# Patient Record
Sex: Male | Born: 1940 | Race: White | Hispanic: No | Marital: Married | Smoking: Former smoker
Health system: Southern US, Community
[De-identification: ages and names within clinical notes are randomized; demographics above are authoritative.]

## PROBLEM LIST (undated history)

## (undated) DIAGNOSIS — E039 Hypothyroidism, unspecified: Secondary | ICD-10-CM

## (undated) DIAGNOSIS — I091 Rheumatic diseases of endocardium, valve unspecified: Secondary | ICD-10-CM

## (undated) DIAGNOSIS — K219 Gastro-esophageal reflux disease without esophagitis: Secondary | ICD-10-CM

## (undated) DIAGNOSIS — G709 Myoneural disorder, unspecified: Secondary | ICD-10-CM

## (undated) DIAGNOSIS — I059 Rheumatic mitral valve disease, unspecified: Secondary | ICD-10-CM

## (undated) DIAGNOSIS — Z9189 Other specified personal risk factors, not elsewhere classified: Secondary | ICD-10-CM

## (undated) DIAGNOSIS — G894 Chronic pain syndrome: Secondary | ICD-10-CM

## (undated) DIAGNOSIS — R011 Cardiac murmur, unspecified: Secondary | ICD-10-CM

## (undated) DIAGNOSIS — F32A Depression, unspecified: Secondary | ICD-10-CM

## (undated) DIAGNOSIS — K8020 Calculus of gallbladder without cholecystitis without obstruction: Secondary | ICD-10-CM

## (undated) DIAGNOSIS — L409 Psoriasis, unspecified: Secondary | ICD-10-CM

## (undated) DIAGNOSIS — J189 Pneumonia, unspecified organism: Secondary | ICD-10-CM

## (undated) DIAGNOSIS — I2699 Other pulmonary embolism without acute cor pulmonale: Secondary | ICD-10-CM

## (undated) DIAGNOSIS — F329 Major depressive disorder, single episode, unspecified: Secondary | ICD-10-CM

## (undated) DIAGNOSIS — I10 Essential (primary) hypertension: Secondary | ICD-10-CM

## (undated) DIAGNOSIS — I739 Peripheral vascular disease, unspecified: Secondary | ICD-10-CM

## (undated) DIAGNOSIS — D509 Iron deficiency anemia, unspecified: Secondary | ICD-10-CM

## (undated) DIAGNOSIS — IMO0001 Reserved for inherently not codable concepts without codable children: Secondary | ICD-10-CM

## (undated) DIAGNOSIS — M791 Myalgia, unspecified site: Secondary | ICD-10-CM

## (undated) DIAGNOSIS — K2270 Barrett's esophagus without dysplasia: Secondary | ICD-10-CM

## (undated) HISTORY — PX: BACK SURGERY: SHX140

## (undated) HISTORY — DX: Calculus of gallbladder without cholecystitis without obstruction: K80.20

## (undated) HISTORY — DX: Myalgia, unspecified site: M79.10

## (undated) HISTORY — PX: HEMORROIDECTOMY: SUR656

## (undated) HISTORY — DX: Psoriasis, unspecified: L40.9

## (undated) HISTORY — DX: Rheumatic mitral valve disease, unspecified: I05.9

## (undated) HISTORY — DX: Barrett's esophagus without dysplasia: K22.70

## (undated) HISTORY — DX: Rheumatic diseases of endocardium, valve unspecified: I09.1

## (undated) HISTORY — DX: Chronic pain syndrome: G89.4

## (undated) HISTORY — DX: Iron deficiency anemia, unspecified: D50.9

## (undated) HISTORY — DX: Essential (primary) hypertension: I10

## (undated) HISTORY — DX: Cardiac murmur, unspecified: R01.1

---
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
## 2004-03-27 ENCOUNTER — Ambulatory Visit: Payer: Self-pay | Admitting: Rheumatology

## 2004-04-07 ENCOUNTER — Inpatient Hospital Stay: Payer: Self-pay

## 2004-04-07 ENCOUNTER — Other Ambulatory Visit: Payer: Self-pay

## 2004-04-11 ENCOUNTER — Ambulatory Visit: Payer: Self-pay

## 2004-04-14 ENCOUNTER — Other Ambulatory Visit: Payer: Self-pay

## 2004-04-14 ENCOUNTER — Inpatient Hospital Stay: Payer: Self-pay | Admitting: Internal Medicine

## 2004-04-17 ENCOUNTER — Inpatient Hospital Stay: Payer: Self-pay

## 2004-06-27 ENCOUNTER — Ambulatory Visit: Payer: Self-pay | Admitting: Rheumatology

## 2007-02-10 LAB — HM COLONOSCOPY

## 2007-03-01 ENCOUNTER — Ambulatory Visit: Payer: Self-pay | Admitting: Gastroenterology

## 2008-01-18 ENCOUNTER — Encounter: Payer: Self-pay | Admitting: Cardiovascular Disease

## 2008-01-27 ENCOUNTER — Encounter: Payer: Self-pay | Admitting: Cardiovascular Disease

## 2008-02-23 ENCOUNTER — Encounter: Payer: Self-pay | Admitting: Cardiovascular Disease

## 2008-05-15 ENCOUNTER — Ambulatory Visit: Payer: Self-pay | Admitting: Gastroenterology

## 2008-08-21 ENCOUNTER — Encounter: Payer: Self-pay | Admitting: Cardiovascular Disease

## 2008-12-07 ENCOUNTER — Encounter: Payer: Self-pay | Admitting: Cardiovascular Disease

## 2009-05-13 ENCOUNTER — Encounter: Payer: Self-pay | Admitting: Cardiovascular Disease

## 2009-05-20 ENCOUNTER — Ambulatory Visit: Payer: Self-pay | Admitting: Family Medicine

## 2009-07-24 ENCOUNTER — Ambulatory Visit: Payer: Self-pay | Admitting: Cardiovascular Disease

## 2009-07-24 DIAGNOSIS — E060 Acute thyroiditis: Secondary | ICD-10-CM | POA: Insufficient documentation

## 2009-07-24 DIAGNOSIS — R011 Cardiac murmur, unspecified: Secondary | ICD-10-CM

## 2009-07-24 DIAGNOSIS — I10 Essential (primary) hypertension: Secondary | ICD-10-CM | POA: Insufficient documentation

## 2009-12-16 ENCOUNTER — Telehealth: Payer: Self-pay | Admitting: Cardiovascular Disease

## 2010-07-22 NOTE — Progress Notes (Signed)
Summary: Refill Bystolic  Phone Note Refill Request   Refills Requested: Medication #1:  BYSTOLIC 5 MG TABS 1 tab by mouth as needed for palpitations. [facility]     Prescriptions: BYSTOLIC 5 MG TABS (NEBIVOLOL HCL) 1 tab by mouth as needed for palpitations  #30 x 3   Entered by:   Bishop Dublin, CMA   Authorized by:   Dossie Arbour MD   Signed by:   Bishop Dublin, CMA on 12/16/2009   Method used:   Electronically to        [phone][facility] (retail)       [address] [place], [place]  [address]       Ph: [phone] or [phone]       Fax: [phone]   RxID:   [id number]

## 2010-07-22 NOTE — Progress Notes (Signed)
Summary: [facility]  [facility]   Imported By: Harlon Flor 07/15/2009 11:12:30  _____________________________________________________________________  External Attachment:    Type:   Image     Comment:   External Document

## 2010-07-22 NOTE — Progress Notes (Signed)
Summary: [facility]  [facility]   Imported By: Harlon Flor 07/15/2009 11:12:05  _____________________________________________________________________  External Attachment:    Type:   Image     Comment:   External Document

## 2010-07-22 NOTE — Progress Notes (Signed)
Summary: PHI  PHI   Imported By: Harlon Flor 07/25/2009 16:44:14  _____________________________________________________________________  External Attachment:    Type:   Image     Comment:   External Document

## 2010-07-22 NOTE — Assessment & Plan Note (Signed)
Summary: NP6/AMD   Visit Type:  New Patient Primary Provider:  Dr. Marguerite Olea  CC:  no cp and sob with exertion - getting better; no edema...  History of Present Illness: 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  [name] states that he is having a problem with his thyroid and was told he had thyroiditis. He has been having periods of palpitations and tachycardia associated with some shortness of breath, typically with exertion. It has been better recently though he states that it was bad for a while. He has been tolerating his medications well with no significant problems. He tries to exercise when he can though is limited due to his chronic muscle aches. He denies any significant chest pain, lightheadedness, near syncope or syncope.  Preventive Screening-Counseling & Management  Alcohol-Tobacco     Alcohol drinks/day: 0     Smoking Status: quit     Year Quit: 2003     Pack years: 1.5  Caffeine-Diet-Exercise     Caffeine use/day: no     Does Patient Exercise: no   Current Problems (verified): 1)  Hyperlipidemia  (ICD-272.4) 2)  Hypertension, Unspecified  (ICD-401.9) 3)  Murmur  (ICD-785.2)  Current Medications (verified): 1)  Lyrica 100 Mg Caps (Pregabalin) .... 4 Tabs Once Daily 2)  Tramadol Hcl 50 Mg Tabs (Tramadol Hcl) .... 2 Tabs Three Times A Day 3)  Amitriptyline Hcl 50 Mg Tabs (Amitriptyline Hcl) .[name].. 1 Tab Once Daily 4)  Osteo Bi-Flex Regular Strength 250-200 Mg Tabs (Glucosamine-Chondroitin) .[name].. 1 Tab Once Daily 5)  Fish Oil 1000 Mg Caps (Omega-3 Fatty Acids) .... 2 Capsules Once Daily 6)  Niacin Cr 1000 Mg Cr-Tabs (Niacin) .[name].. 1 Tab Once Daily 7)  Nexium 40 Mg Cpdr (Esomeprazole Magnesium) .[name].. 1 Tab Once Daily 8)   Claritin 10 Mg Caps (Loratadine) .... As Needed 9)  Vitamin D-1000 Max St [phone] Mg-Unit Tabs (Calcium-Vitamin D) .[name].. 1 By Mouth Two Times A Day 10)  Micardis Hct 80-25 Mg Tabs (Telmisartan-Hctz) .[name].. 1  By Mouth Once Daily 11)  Welchol 625 Mg Tabs (Colesevelam Hcl) .... 3 Tabs Two Times A Day 12)  Zolpidem Tartrate 10 Mg Tabs (Zolpidem Tartrate) .[name].. 1 By Mouth Once Daily 13)  Centrum Silver  Tabs (Multiple Vitamins-Minerals) .[name].. 1 By Mouth Once Daily 14)  Resveratrol 100 Mg Caps (Resveratrol) .... 2 By Mouth Once Daily 15)  Finasteride 5 Mg Tabs (Finasteride) .[name].. 1 By Mouth Once Daily 16)  Clobetasol Propionate 0.05 % Crea (Clobetasol Propionate) .... As Directed 17)  Lipoic Acid 150 Mg Caps (Alpha-Lipoic Acid) .[name].. 1 By Mouth Two Times A Day 18)  Aspirin 81 Mg Tbec (Aspirin) .... 2  Tablet By Mouth Daily  Allergies (verified): 1)  ! * Statin 2)  ! Crestor  Past History:  Family History: Last updated: 08-08-2009 Father: Deceased. Died at 59 yrs old. Illness: CHF. Mother: Deceased. Died at 46 yrs old. Illness: Heart failure. Siblings:  Brother: Deceased. 56 yrs old. Illness: Brain tumor. Sister: Died at 87. Illness: cancer. Sister: Died at 75. Illness: Aneurysm of the brain.  Social History: Last updated: 08-08-09 Retired disabled from [employer]. Married  2 children Tobacco Use - Former. Quit 3 years ago  Alcohol Use - no Drinks 4-5 cups of coffee a day, and 3-4  cans of soda a day.  Risk Factors: Alcohol Use: 0 (07/24/2009) Caffeine Use: no (07/24/2009) Exercise: no  (07/24/2009)  Risk Factors: Smoking Status: quit (07/24/2009)  Past Medical History: History of Murmur Rheumatic valve Mitral valve disease hypertension hemorrhoids psoriasis severe pain syndrome multiple myalgias  Thyroiditis Prostate ? cancer  Past Surgical History: back surgery hermorrhoid surgery  Social History: Alcohol drinks/day:  0 Pack years:   1.5 Caffeine use/day:  no Does Patient Exercise:  no   Review of Systems       diffuse muscle ache, palpitations, SOB with exertion  Vital Signs:  Patient profile:   70 year old male Height:      72 inches Weight:      173 pounds BMI:     23.55 Pulse rate:   76 / minute Pulse rhythm:   regular BP sitting:   112 / 68  (left arm) Cuff size:   regular  Vitals Entered By: Mercer Pod (July 24, 2009 9:16 AM)  Physical Exam  General:  well-appearing gentleman in no apparent distress. Thin. HEENT exam is benign, oropharynx is clear neck is supple with no JVP or carotid bruits heart sounds are regular with 2/6 systolic ejection murmur at noted in the axilla radiating to the apex lungs are clear to auscultation with no wheezes or rales. Abdominal exam is benign nontender. No significant lower extremity edema, pulses are equal and symmetrical in his upper and lower extremities.   New Orders:     1)  EKG w/ Interpretation (93000)   Problems:  Medical Problems Added: 1)  Dx of Acute Thyroiditis  (ICD-245.0) 2)  Dx of Hyperlipidemia  (ICD-272.4) 3)  Dx of Hypertension, Unspecified  (ICD-401.9) 4)  Dx of Murmur  (ICD-785.2)  Impression & Recommendations:  Problem # 1:  ACUTE THYROIDITIS (ICD-245.0) Mr. counselor has a very low TSH and is likely contributing to his palpitations and tachycardia and shortness of breath with exertion. We have given him some samples of bystolic 5 mg that he can take on an as needed basis. He states that it has improved though he can take the beta blocker on an as-needed basis. If he likes the way he feels on the beta blocker, I have asked him to call us if her a prescription for by systolic we'll change him to an alternate beta blocker might be more affordable.  Problem # 2:  MURMUR (ICD-785.2) [name] has mild to moderate mitral regurgitation and mitral annular calcification. He is relatively asymptomatic. We will not repeat an  echocardiogram at this time but would certainly do so for worsening symptoms of shortness of breath or a change in his clinical presentation. His updated medication list for this problem includes:    Losartan Potassium-hctz 100-25 Mg Tabs (Losartan potassium-hctz) .[name]... 1 tab by mouth daily    Bystolic 5 Mg Tabs (Nebivolol hcl) .[name]... 1 tab by mouth as needed for palpitations  His updated medication list for this problem includes:    Losartan Potassium-hctz 100-25 Mg Tabs (Losartan potassium-hctz) .[name]... 1 tab by mouth daily    Bystolic 5 Mg Tabs (Nebivolol hcl) .[name]... 1 tab by mouth as needed for palpitations  Problem # 3:  HYPERLIPIDEMIA (ICD-272.4) cholesterol is relatively well controlled on WelChol 3 tablets twice a day and Niaspan 1000 mg daily. We will continue him on his current medication regimen. He has been unable to tolerate statins due to chronic muscle ache His updated medication list for this problem includes:  Niacin Cr 1000 Mg Cr-tabs (Niacin) .[name]... 2 tab once daily    Welchol 625 Mg Tabs (Colesevelam hcl) .Marland KitchenMarland KitchenMarland KitchenMarland Kitchen 3 tabs two times a day  Problem # 4:  HYPERTENSION, UNSPECIFIED (ICD-401.9) blood pressure is well controlled. We will change his Micardis HCT to losartan HCT in an effort to save him some money. He states that he Micardis is very expensive for him. The dose of the losartan will be 100/25 mg daily. His updated medication list for this problem includes:    Losartan Potassium-hctz 100-25 Mg Tabs (Losartan potassium-hctz) .[name]... 1 tab by mouth daily    Aspirin 81 Mg Tbec (Aspirin) .[name]... 2  tablet by mouth daily    Bystolic 5 Mg Tabs (Nebivolol hcl) .[name]... 1 tab by mouth as needed for palpitations  Orders: EKG w/ Interpretation (93000)  Patient Instructions: 1)  Your physician recommends that you schedule a follow-up appointment in: 1 year 2)  Your physician has recommended you make the following change in your medication: stop micardis-hctz. start losartan-hctz 100-25 mg  daily, start bystolic 5 mg as needed.   Prescriptions: NIACIN CR 1000 MG CR-TABS (NIACIN) 2 tab once daily  #180 x 4   Entered by:   Charlena Cross, RN, BSN   Authorized by:   Dossie Arbour MD   Signed by:   Charlena Cross, RN, BSN on 07/24/2009   Method used:   [facility]        [facility]       [address]       [place], [place]  [address]       Ph: [phone]       Fax: [phone]   RxID:   [id number] WELCHOL 625 MG TABS (COLESEVELAM HCL) 3 tabs two times a day  #540 x 4   Entered by:   Charlena Cross, RN, BSN   Authorized by:   Dossie Arbour MD   Signed by:   Charlena Cross, RN, BSN on 07/24/2009   Method used:   Electronically to        [facility]       [address]       [place], [place]  [address]       Ph: [phone]       Fax: [phone]   RxID:   [id number] LOSARTAN POTASSIUM-HCTZ 100-25 MG TABS (LOSARTAN POTASSIUM-HCTZ) 1 tab by mouth daily  #90 x 4   Entered by:   Charlena Cross, RN, BSN   Authorized by:   Dossie Arbour MD   Signed by:   Charlena Cross, RN, BSN on 07/24/2009   Method used:   [facility]        [facility]       [address]       [place], [place]  [address]       Ph: [phone]       Fax: [phone]   RxID:   [id number]   Appended Document: NP6/AMD    Clinical Lists Changes  Observations: Added new observation of T4, FREE: 4.30 ng/dL (51/76/1607 37:10) Added new observation of TSH: 0.057 microintl units/mL (05/13/2009 10:49) Added new observation of HGBA1C: 6.1 % (05/13/2009 10:49) Added new observation of TRIGLYCERIDE: 55 mg/dL (62/69/4854 62:70) Added new observation of HDL: 36.9 mg/dL (35/00/9381 82:99) Added new observation of LDL: 90.1 mg/dL (37/16/9678 93:81) Added new observation of ALBUMIN: 3.8 g/dL (01/75/1025 85:27) Added new observation of PROTEIN, TOT: 6.8 g/dL (78/24/2353 61:44) Added  new observation of CHOLESTEROL: 138 mg/dL (08/65/7846 96:29) Added new observation of ALK PHOS: 83 units/L (05/13/2009 10:46) Added new observation of BILI TOTAL: 0.5 mg/dL (52/84/1324 40:10) Added new observation of SGPT (ALT): 12 units/L (05/13/2009 10:46) Added new observation of SGOT (AST): 18 units/L (05/13/2009 10:46) Added new observation of CALCIUM: 9.1 mg/dL (27/25/3664 40:34) Added new observation of CO2 PLSM/SER: 29.1 meq/L (05/13/2009 10:46) Added new observation of CL SERUM: 103 meq/L (05/13/2009 10:46) Added new observation of K SERUM: 3.6 meq/L (05/13/2009 10:46) Added new observation of NA: 140.3 meq/L (05/13/2009 10:46) Added new observation of CREATININE: 1.0 mg/dL (74/25/9563 87:56) Added new observation of BUN: 20 mg/dL (43/32/9518 84:16) Added new observation of BG RANDOM: 134 mg/dL (60/63/0160 10:93) Added new observation of LYMPH %: 28.2 % (05/13/2009 10:45) Added new observation of PMN %: 56.2 % (05/13/2009 10:45) Added new observation of RDW: 14.5 % (05/13/2009 10:45) Added new observation of MCV: 83.6 fL (05/13/2009 10:45) Added new observation of PLATELETK/UL: 253 K/uL (05/13/2009 10:45) Added new observation of RBC M/UL: 4.21 M/uL (05/13/2009 10:45) Added new observation of HCT: 35.2 % (05/13/2009 10:45) Added new observation of HGB: 11.0 g/dL (23/55/7322 02:54) Added new observation of WBC COUNT: 6.7 10*3/microliter (05/13/2009 10:45)       -  Date:  05/13/2009    WBC: 6.7    HGB: 11.0    HCT: 35.2    RBC: 4.21    PLT: 253    MCV: 83.6    RDW: 14.5    Neutrophil: 56.2    Lymphs: 28.2    BG Random: 134    BUN: 20    Creatinine: 1.0    Sodium: 140.3    Potassium: 3.6    Chloride: 103    CO2 Total: 29.1    Calcium: 9.1    SGOT (AST): 18    SGPT (ALT): 12    T. Bilirubin: 0.5    Alk Phos: 83    Cholesterol: 138    Total Protein: 6.8    Albumin: 3.8    LDL: 90.1    HDL: 36.9    Triglycerides: 55    HgbA1c: 6.1    TSH: 0.057     T4-Free: 4.30  Appended Document: NP6/AMD  ROS The patient denies anorexia, fever, weight loss, weight gain, vision loss, decreased hearing, hoarseness, chest pain, syncope, dyspnea on exertion, peripheral edema, prolonged cough, headaches, hemoptysis, abdominal pain, melena, hematochezia, severe indigestion/heartburn, hematuria, incontinence, genital sores, muscle weakness, suspicious skin lesions, transient blindness, difficulty walking, depression, unusual weight change, abnormal bleeding, enlarged lymph nodes, angioedema, breast masses, and testicular masses.

## 2010-07-22 NOTE — Progress Notes (Signed)
Summary: [facility]  [facility]   Imported By: Harlon Flor 07/15/2009 11:12:58  _____________________________________________________________________  External Attachment:    Type:   Image     Comment:   External Document

## 2010-07-22 NOTE — Progress Notes (Signed)
Summary: [facility]  [facility]   Imported By: Harlon Flor 07/15/2009 11:13:42  _____________________________________________________________________  External Attachment:    Type:   Image     Comment:   External Document

## 2010-07-22 NOTE — Progress Notes (Signed)
Summary: [facility]  [facility]   Imported By: Harlon Flor 07/15/2009 11:11:31  _____________________________________________________________________  External Attachment:    Type:   Image     Comment:   External Document

## 2010-07-28 ENCOUNTER — Encounter: Payer: Self-pay | Admitting: Cardiovascular Disease

## 2010-07-28 ENCOUNTER — Ambulatory Visit (INDEPENDENT_AMBULATORY_CARE_PROVIDER_SITE_OTHER): Payer: 59 | Admitting: Cardiovascular Disease

## 2010-07-28 DIAGNOSIS — E785 Hyperlipidemia, unspecified: Secondary | ICD-10-CM

## 2010-07-28 DIAGNOSIS — I059 Rheumatic mitral valve disease, unspecified: Secondary | ICD-10-CM

## 2010-07-28 DIAGNOSIS — R002 Palpitations: Secondary | ICD-10-CM

## 2010-08-01 ENCOUNTER — Other Ambulatory Visit (INDEPENDENT_AMBULATORY_CARE_PROVIDER_SITE_OTHER): Payer: 59

## 2010-08-01 ENCOUNTER — Encounter: Payer: Self-pay | Admitting: Cardiovascular Disease

## 2010-08-01 DIAGNOSIS — R000 Tachycardia, unspecified: Secondary | ICD-10-CM

## 2010-08-01 DIAGNOSIS — E785 Hyperlipidemia, unspecified: Secondary | ICD-10-CM

## 2010-08-01 DIAGNOSIS — IMO0001 Reserved for inherently not codable concepts without codable children: Secondary | ICD-10-CM

## 2010-08-07 NOTE — Assessment & Plan Note (Signed)
Summary: F/U 1 YEAR/SAB   Visit Type:  Follow-up Primary Provider:  Dr. Marguerite Olea  CC:  "Doing well"..  History of Present Illness: Mr. 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 Seefeldt has had periods of palpitations and tachycardia with shortness of breath, typically with exertion. He has had chronic muscle aches of uncertain etiology which limits his ability to exercise. He states having held many of his medications to see if this helps improve his symptoms though nothing seems to help.  he does report a history of Barrett's esophagus and he stopped his Nexium sometime ago thinking it was causing muscle problem. He has been taking his wife's niacin OTC  He denies any significant chest pain, lightheadedness, near syncope or syncope.  EKG shows normal sinus rhythm with rate of 74 beats per minute, no significant ST or T wave changes  Current Medications (verified): 1)  Tramadol Hcl 50 Mg Tabs (Tramadol Hcl) .... 2 Tabs Three Times A Day 2)  Amitriptyline Hcl 50 Mg Tabs (Amitriptyline Hcl) .[name].. 1 Tab Once Daily 3)  Osteo Bi-Flex Regular Strength 250-200 Mg Tabs (Glucosamine-Chondroitin) .[name].. 1 Tab Once Daily 4)  Fish Oil 1000 Mg Caps (Omega-3 Fatty Acids) .... 2 Capsules Once Daily 5)  Niacin Cr 1000 Mg Cr-Tabs (Niacin) .... 2 Tab Once Daily 6)  Nexium 40 Mg Cpdr (Esomeprazole Magnesium) .[name].. 1 Tab Once Daily 7)  Vitamin D-1000 Max St [phone] Mg-Unit Tabs (Calcium-Vitamin D) .[name].. 1 By Mouth Two Times A Day 8)  Losartan Potassium-Hctz 100-25 Mg Tabs (Losartan Potassium-Hctz) .[name].. 1 Tab By Mouth Daily 9)  Welchol 625 Mg Tabs (Colesevelam Hcl) .... 3 Tabs Two Times A Day 10)  Zolpidem Tartrate 10 Mg Tabs (Zolpidem Tartrate) .[name].. 1 By Mouth Once  Daily 11)  Centrum Silver  Tabs (Multiple Vitamins-Minerals) .[name].. 1 By Mouth Once Daily 12)  Finasteride 5 Mg Tabs (Finasteride) .[name].. 1 By Mouth Once Daily 13)  Clobetasol Propionate 0.05 % Crea (Clobetasol Propionate) .... As Directed 14)  Lipoic Acid 150 Mg Caps (Alpha-Lipoic Acid) .[name].. 1 By Mouth Two Times A Day 15)  Aspirin 81 Mg Tbec (Aspirin) .... 2  Tablet By Mouth Daily 16)  Bystolic 5 Mg Tabs (Nebivolol Hcl) .[name].. 1 Tab By Mouth As Needed For Palpitations 17)  Levothroid 50 Mcg Tabs (Levothyroxine Sodium) .... One Tablet Once Daily 18)  Dovonex 0.005 % Crea (Calcipotriene) 19)  Androgel Pump 1.25 Gm/act (1%) Gel (Testosterone) .... Daily  Allergies (verified): 1)  ! * Statin 2)  ! Crestor  Past History:  Past Medical History: Last updated: 07/24/2009 History of Murmur Rheumatic valve Mitral valve disease hypertension hemorrhoids psoriasis severe pain syndrome multiple myalgias  Thyroiditis Prostate ? cancer  Past Surgical History: Last updated: 07/24/2009 back surgery hermorrhoid surgery  Family History: Last updated: 17-Jul-2009 Father: Deceased. Died at 60 yrs old. Illness: CHF. Mother: Deceased. Died at 37 yrs old. Illness: Heart failure. Siblings:  Brother: Deceased. 37 yrs old. Illness: Brain tumor. Sister: Died at 71. Illness: cancer. Sister: Died at 21. Illness: Aneurysm of the brain.  Social History: Last updated: 17-Jul-2009 Retired disabled from [employer]. Married  2 children Tobacco Use - Former. Quit 3 years ago  Alcohol Use - no Drinks 4-5 cups of coffee a day, and 3-4 cans of soda a day.  Risk Factors: Alcohol Use: 0 (07/24/2009) Caffeine Use: no (07/24/2009) Exercise: no  (07/24/2009)  Risk Factors: Smoking Status: quit (07/24/2009)  Review of Systems  The patient denies fever, weight loss, weight gain, vision loss, decreased hearing, hoarseness, chest pain, syncope, dyspnea on exertion, peripheral edema,  prolonged cough, abdominal pain, incontinence, muscle weakness, depression, and enlarged lymph nodes.         chronic diffuse muscle ache  Vital Signs:  Patient profile:   70 year old male Height:      72 inches Weight:      175 pounds BMI:     23.82 Pulse rate:   74 / minute BP sitting:   126 / 76  (left arm) Cuff size:   regular  Vitals Entered By: Bishop Dublin, CMA (July 28, 2010 10:04 AM)  Physical Exam  General:  well-appearing gentleman in no apparent distress. Thin. HEENT exam is benign, oropharynx is clear neck is supple with no JVP or carotid bruits heart sounds are regular with 2/6 systolic ejection murmur at noted in the axilla radiating to the apex lungs are clear to auscultation with no wheezes or rales. Abdominal exam is benign nontender. No significant lower extremity edema, pulses are equal and symmetrical in his upper and lower extremities. Neurologic:  Alert and oriented x 3. Skin:  Intact without lesions or rashes. Psych:  Normal affect.   Impression & Recommendations:  Problem # 1:  MURMUR (ICD-785.2) he does have mitral valve regurgitation. He is relatively asymptomatic and we will monitor him closely. Clearly audible on exam.  His updated medication list for this problem includes:    Losartan Potassium 100 Mg Tabs (Losartan potassium) .[name]... Take one tablet once daily.    Bystolic 5 Mg Tabs (Nebivolol hcl) .[name]... 1 tab by mouth as needed for palpitations  Orders: EKG w/ Interpretation (93000)  Problem # 2:  HYPERTENSION, UNSPECIFIED (ICD-401.9) he is concerned that HCTZ could be contributing to his muscle ache. We will change him to losartan without HCTZ. For blood pressure control, we have suggested taking a beta blocker on a regular basis.  His updated medication list for this problem includes:    Losartan Potassium 100 Mg Tabs (Losartan potassium) .[name]... Take one tablet once daily.    Aspirin 81 Mg Tbec (Aspirin) .[name]... 2  tablet by mouth daily     Bystolic 5 Mg Tabs (Nebivolol hcl) .[name]... 1 tab by mouth as needed for palpitations  Orders: EKG w/ Interpretation (93000)  Problem # 3:  HYPERLIPIDEMIA (ICD-272.4) We have suggested he try to hold his niacin to make sure this is not contributing to his muscle ache. If this is unrelated, we will continue his same medications as his cholesterol is typically well controlled. We have suggested he check his cholesterol this week prior to any changes.  His updated medication list for this problem includes:    Niaspan 1000 Mg Cr-tabs (Niacin (antihyperlipidemic)) .[name]... Take 2 tablets once daily.    Welchol 625 Mg Tabs (Colesevelam hcl) .Marland KitchenMarland KitchenMarland KitchenMarland Kitchen 3 tabs two times a day  Patient Instructions: 1)  Your physician recommends that you schedule a follow-up appointment in: 6 months 2)  TO BE SCHEDULED THIS FRIDAY: Your physician recommends that you return for a FASTING lipid profile: (LIPID/LFT/CK) 3)  Your physician has recommended you make the following change in your medication: STOP Losartan/HCTZ. START Losartan 100mg  once daily. STOP Niacin. START Niaspan 1000mg  2 tablets once daily.  Prescriptions: LOSARTAN POTASSIUM 100 MG TABS (LOSARTAN POTASSIUM) Take one  tablet once daily.  #90 x 4   Entered by:   Lanny Hurst RN   Authorized by:   Dossie Arbour MD   Signed by:   Lanny Hurst RN on 07/28/2010   Method used:   Faxed to ...       [facility] (mail-order)             , [place]         Ph: [phone]       Fax: [phone]   RxID:   [id number] WELCHOL 625 MG TABS (COLESEVELAM HCL) 3 tabs two times a day  #540 x 4   Entered by:   Lanny Hurst RN   Authorized by:   Dossie Arbour MD   Signed by:   Lanny Hurst RN on 07/28/2010   Method used:   Faxed to ...       [facility] (mail-order)             , [place]         Ph: [phone]       Fax: [phone]   RxID:   [id number] NIASPAN 1000 MG CR-TABS (NIACIN (ANTIHYPERLIPIDEMIC)) Take 2 tablets once daily.  #180 x 4   Entered by:   Lanny Hurst RN   Authorized  by:   Dossie Arbour MD   Signed by:   Lanny Hurst RN on 07/28/2010   Method used:   Faxed to ...       [facility] (mail-order)             , [place]         Ph: [phone]       Fax: [phone]   RxID:   [id number]

## 2010-08-18 LAB — CONVERTED CEMR LAB
ALT: 11 units/L (ref 0–53)
Albumin: 4.4 g/dL (ref 3.5–5.2)
HDL: 46 mg/dL (ref >39)
LDL Cholesterol: 124 mg/dL — ABNORMAL HIGH (ref 0–99)
Total Protein: 6.6 g/dL (ref 6.0–8.3)
Triglycerides: 72 mg/dL (ref <150)
VLDL: 14 mg/dL (ref 0–40)

## 2010-12-30 ENCOUNTER — Ambulatory Visit: Payer: Self-pay | Admitting: Gastroenterology

## 2010-12-31 LAB — PATHOLOGY REPORT

## 2011-01-20 ENCOUNTER — Encounter: Payer: Self-pay | Admitting: Cardiovascular Disease

## 2011-01-27 ENCOUNTER — Encounter: Payer: Self-pay | Admitting: Cardiovascular Disease

## 2011-01-27 ENCOUNTER — Ambulatory Visit (INDEPENDENT_AMBULATORY_CARE_PROVIDER_SITE_OTHER): Payer: 59 | Admitting: Cardiovascular Disease

## 2011-01-27 ENCOUNTER — Encounter: Payer: Self-pay | Admitting: Internal Medicine

## 2011-01-27 DIAGNOSIS — I739 Peripheral vascular disease, unspecified: Secondary | ICD-10-CM

## 2011-01-27 DIAGNOSIS — R002 Palpitations: Secondary | ICD-10-CM

## 2011-01-27 DIAGNOSIS — R011 Cardiac murmur, unspecified: Secondary | ICD-10-CM

## 2011-01-27 DIAGNOSIS — I10 Essential (primary) hypertension: Secondary | ICD-10-CM

## 2011-01-27 DIAGNOSIS — E785 Hyperlipidemia, unspecified: Secondary | ICD-10-CM

## 2011-01-27 MED ORDER — METOPROLOL TARTRATE 25 MG PO TABS: 25.0000 mg | ORAL_TABLET | Freq: Two times a day (BID) | ORAL | Status: DC | PRN

## 2011-01-27 NOTE — Assessment & Plan Note (Signed)
Currently on WelChol and niacin. He reports most recent total cholesterol 150 after recent weight loss from earlier in the year.

## 2011-01-27 NOTE — Assessment & Plan Note (Signed)
No symptoms from his mild/moderate mitral valve regurgitation. No further imaging at this time

## 2011-01-27 NOTE — Assessment & Plan Note (Signed)
Blood pressure is well controlled on today's visit. No changes made to the medications. 

## 2011-01-27 NOTE — Patient Instructions (Signed)
You are doing well. Please try metoprolol 1/2 tab for palpitations. Take other 1/2 if palpitations persist.  Please call us if you have new issues that need to be addressed before your next appt.  We will call you for a follow up Appt. In12 months

## 2011-01-27 NOTE — Assessment & Plan Note (Signed)
He reports abdominal aortic ultrasound showing mild plaquing. Likely from his 30 years of smoking.

## 2011-01-27 NOTE — Progress Notes (Signed)
Patient ID: Timothy Turner, male    DOB: 08-15-1940, 70 y.o.   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 He also has periods of tachycardia palpitations one time per week. Heart rate ranges from 60s to 90s. He takes bystolic periodically though if he takes this 2 days in a row, it causes fatigue. Otherwise he feels well with no symptoms   he does report a history of Barrett's esophagus and he stopped his Nexium sometime ago thinking it was causing muscle problem. He has been taking his wife's niacin OTC He denies any significant chest pain, lightheadedness, near syncope or syncope.   EKG shows normal sinus rhythm with rate of 72 beats per minute, no significant ST or T wave changes      Outpatient Encounter Prescriptions as of 01/27/2011  Medication Sig Dispense Refill  . amitriptyline (ELAVIL) 50 MG tablet Take 50 mg by mouth at bedtime.        [name] aspirin 81 MG tablet Take 81 mg by mouth daily.        . calcipotriene (DOVONEX) 0.005 % cream Apply topically.        . Cholecalciferol (VITAMIN D HIGH POTENCY) 1000 UNITS capsule Take 1,000 Units by mouth daily. Take one tablet by mouth two times a day.       . clobetasol (TEMOVATE) 0.05 % cream Apply topically 2 (two) times daily.        . colesevelam (WELCHOL) 625 MG tablet Take 1,875 mg by mouth 2 (two) times daily with a meal.        . esomeprazole (NEXIUM) 40 MG capsule Take 40 mg by mouth daily before breakfast.        . finasteride (PROSCAR) 5 MG tablet Take 5 mg by mouth daily.        . Glucosamine-Chondroitin (OSTEO  BI-FLEX REGULAR STRENGTH) 250-200 MG TABS Take by mouth. Take one tablet daily.       [name] levothyroxine (SYNTHROID, LEVOTHROID) 75 MCG tablet Take 75 mcg by mouth daily.        . Lipoic Acid 150 MG CAPS Take by mouth. Take one tablet twice a day.       . loratadine (CLARITIN) 10 MG tablet Take 10 mg by mouth daily.        [name] losartan (COZAAR) 100 MG tablet Take 100 mg by mouth daily.        . Multiple Vitamins-Minerals (CENTRUM SILVER PO) Take by mouth.        . nebivolol (BYSTOLIC) 5 MG tablet Take 5 mg by mouth daily as needed.       . Niacin CR 1000 MG TBCR Take 1,000 mg by mouth. Take two tablets once daily.       . Omega-3 Fatty Acids (FISH OIL) 1000 MG CAPS Take 1,000 mg by mouth. Take two tablets daily.       . Testosterone (ANDROGEL PUMP) 1.25 GM/ACT (1%) GEL Place onto the skin daily.        . traMADol (ULTRAM) 50 MG tablet Take 100 mg by mouth 3 (three) times daily.        [name]  zolpidem (AMBIEN) 10 MG tablet Take 10 mg by mouth at bedtime as needed.           Review of Systems  Constitutional: Negative.   HENT: Negative.   Eyes: Negative.   Respiratory: Negative.   Cardiovascular: Positive for palpitations.  Gastrointestinal: Negative.   Musculoskeletal: Positive for myalgias.  Skin: Negative.   Neurological: Negative.   Hematological: Negative.   Psychiatric/Behavioral: Negative.   All other systems reviewed and are negative.    BP 140/78  Pulse 72  Ht 6' (1.829 m)   Physical Exam  Nursing note and vitals reviewed. Constitutional: He is oriented to person, place, and time. He appears well-developed and well-nourished.  HENT:  Head: Normocephalic.  Nose: Nose normal.  Mouth/Throat: Oropharynx is clear and moist.  Eyes: Conjunctivae are normal. Pupils are equal, round, and reactive to light.  Neck: Normal range of motion. Neck supple. No JVD present.  Cardiovascular: Normal rate, regular rhythm, S1 normal, S2 normal, normal heart sounds and intact distal pulses.  Exam  reveals no gallop and no friction rub.   No murmur heard. Pulmonary/Chest: Effort normal and breath sounds normal. No respiratory distress. He has no wheezes. He has no rales. He exhibits no tenderness.  Abdominal: Soft. Bowel sounds are normal. He exhibits no distension. There is no tenderness.  Musculoskeletal: Normal range of motion. He exhibits no edema and no tenderness.  Lymphadenopathy:    He has no cervical adenopathy.  Neurological: He is alert and oriented to person, place, and time. Coordination normal.  Skin: Skin is warm and dry. No rash noted. No erythema.  Psychiatric: He has a normal mood and affect. His behavior is normal. Judgment and thought content normal.           Assessment and Plan

## 2011-03-12 ENCOUNTER — Ambulatory Visit (INDEPENDENT_AMBULATORY_CARE_PROVIDER_SITE_OTHER): Payer: 59 | Admitting: Internal Medicine

## 2011-03-12 ENCOUNTER — Encounter: Payer: Self-pay | Admitting: Internal Medicine

## 2011-03-12 VITALS — BP 151/84 | HR 77 | Temp 98.2°F | Resp 16 | Ht 72.0 in | Wt 174.5 lb

## 2011-03-12 DIAGNOSIS — M791 Myalgia, unspecified site: Secondary | ICD-10-CM | POA: Insufficient documentation

## 2011-03-12 DIAGNOSIS — IMO0001 Reserved for inherently not codable concepts without codable children: Secondary | ICD-10-CM

## 2011-03-12 DIAGNOSIS — Z23 Encounter for immunization: Secondary | ICD-10-CM

## 2011-03-12 DIAGNOSIS — E039 Hypothyroidism, unspecified: Secondary | ICD-10-CM | POA: Insufficient documentation

## 2011-03-12 MED ORDER — LEVOTHYROXINE SODIUM 75 MCG PO TABS: 75.0000 ug | ORAL_TABLET | Freq: Every day | ORAL | Status: DC

## 2011-03-12 MED ORDER — CLONAZEPAM 0.5 MG PO TABS: 0.5000 mg | ORAL_TABLET | Freq: Three times a day (TID) | ORAL | Status: DC | PRN

## 2011-03-12 NOTE — Progress Notes (Signed)
Subjective:    Patient ID: Timothy Turner, male    DOB: Jan 10, 1941, 70 y.o.   MRN: [id number]  HPI Timothy Turner is a 70 year old male with a history of hypothyroidism and chronic muscular pain and weakness who presents for followup. At our last visit we tried starting Timothy Turner on Flexeril. Timothy Turner reports no improvement in his symptoms of muscle cramping with this medication. Timothy Turner continues to have severe muscle spasm and cramping particularly at night which keeps Timothy Turner from sleep. Timothy Turner has had extensive evaluation for this with [facility]. No etiology has been determined.  Timothy Turner also has a history of hypothyroidism. His recent TSH obtained in July 2012 was normal. Timothy Turner continues on daily Synthroid. Timothy Turner reports full compliance with his medication.  Timothy Turner also reports Timothy Turner has been checking his blood pressure at home. It typically runs in the 120s over 70s.  Outpatient Encounter Prescriptions as of 03/12/2011  Medication Sig Dispense Refill  . amitriptyline (ELAVIL) 50 MG tablet Take 50 mg by mouth at bedtime.        [name] aspirin 81 MG tablet Take 81 mg by mouth daily.        . calcipotriene (DOVONEX) 0.005 % cream Apply topically.        . Cholecalciferol (VITAMIN D HIGH POTENCY) 1000 UNITS capsule Take 1,000 Units by mouth daily. Take one tablet by mouth two times a day.       . clobetasol (TEMOVATE) 0.05 % cream Apply topically 2 (two) times daily.        . colesevelam (WELCHOL) 625 MG tablet Take 1,875 mg by mouth 2 (two) times daily with a meal.       . esomeprazole (NEXIUM) 40 MG capsule Take 40 mg by mouth daily before breakfast.        . finasteride (PROSCAR) 5 MG tablet Take 5 mg by mouth daily.        . Glucosamine-Chondroitin (OSTEO BI-FLEX REGULAR STRENGTH) 250-200 MG TABS Take by mouth. Take one tablet daily.       [name] levothyroxine (SYNTHROID, LEVOTHROID) 75 MCG tablet Take 1 tablet (75 mcg total) by mouth daily.  90 tablet  4  . Lipoic Acid 150 MG CAPS Take by mouth. Take one tablet twice a day.         . loratadine (CLARITIN) 10 MG tablet Take 10 mg by mouth daily.        [name] losartan (COZAAR) 100 MG tablet Take 100 mg by mouth daily.        . metoprolol tartrate (LOPRESSOR) 25 MG tablet Take 1 tablet (25 mg total) by mouth 2 (two) times daily as needed. For palpitations or tachycardia  60 tablet  11  . Multiple Vitamins-Minerals (CENTRUM SILVER PO) Take by mouth.        . niacin (NIASPAN) 1000 MG CR tablet Take 1,000 mg by mouth 2 (two) times daily.        . Omega-3 Fatty Acids (FISH OIL) 1000 MG CAPS Take 1,000 mg by mouth. Take two tablets daily.       . Testosterone (AXIRON) 30 MG/ACT SOLN Place 1 application onto the skin daily.        . traMADol (ULTRAM) 50 MG tablet Take 100 mg by mouth 3 (three) times daily.        [name] zolpidem (AMBIEN) 10 MG tablet Take 10 mg by mouth at bedtime as needed.          Review of Systems  Constitutional: Negative for fever,  chills, activity change, appetite change, fatigue and unexpected weight change.  Eyes: Negative for visual disturbance.  Respiratory: Negative for cough and shortness of breath.   Cardiovascular: Negative for chest pain, palpitations and leg swelling.  Gastrointestinal: Negative for abdominal pain and abdominal distention.  Genitourinary: Negative for dysuria, urgency and difficulty urinating.  Musculoskeletal: Positive for myalgias. Negative for arthralgias and gait problem.  Skin: Negative for color change and rash.  Hematological: Negative for adenopathy.  Psychiatric/Behavioral: Positive for sleep disturbance. Negative for dysphoric mood. The patient is not nervous/anxious.    BP 151/84  Pulse 77  Temp(Src) 98.2 F (36.8 C) (Oral)  Resp 16  Ht 6' (1.829 m)  Wt 174 lb 8 oz (79.153 kg)  BMI 23.67 kg/m2  SpO2 98%     Objective:   Physical Exam  Constitutional: Timothy Turner is oriented to person, place, and time. Timothy Turner appears well-developed and well-nourished. No distress.  HENT:  Head: Normocephalic and atraumatic.  Right Ear:  External ear normal.  Left Ear: External ear normal.  Nose: Nose normal.  Mouth/Throat: Oropharynx is clear and moist. No oropharyngeal exudate.  Eyes: Conjunctivae and EOM are normal. Pupils are equal, round, and reactive to light. Right eye exhibits no discharge. Left eye exhibits no discharge. No scleral icterus.  Neck: Normal range of motion. Neck supple. No tracheal deviation present. No thyromegaly present.  Cardiovascular: Normal rate, regular rhythm and normal heart sounds.  Exam reveals no gallop and no friction rub.   No murmur heard. Pulmonary/Chest: Effort normal and breath sounds normal. No respiratory distress. Timothy Turner has no wheezes. Timothy Turner has no rales. Timothy Turner exhibits no tenderness.  Musculoskeletal: Normal range of motion. Timothy Turner exhibits no edema.  Lymphadenopathy:    Timothy Turner has no cervical adenopathy.  Neurological: Timothy Turner is alert and oriented to person, place, and time. No cranial nerve deficit. Coordination normal.  Skin: Skin is warm and dry. No rash noted. Timothy Turner is not diaphoretic. No erythema. No pallor.  Psychiatric: Timothy Turner has a normal mood and affect. His behavior is normal. Judgment and thought content normal.          Assessment & Plan:  1. Hypothyroidism - patient with hypothyroidism on Synthroid. Recent TSH was normal. Will refill Synthroid today.  2. 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 Timothy Turner has been evaluated for progressive neuromuscle disorders at [facility]. No etiology has been determined to date. Timothy Turner continues to have severe cramping which limits his ability to sleep. We will try using clonazepam at night to help with muscle cramping. We discussed the risk of this medication including dependency, sedation. Timothy Turner will limit its use to only severe episodes of muscle cramping. Timothy Turner will call if there are any problems with this medication. We also discussed the potential second  opinion from another [facility] Center, but Timothy Turner would like to defer at this time. We also discussed that in approximately 2% of patients muscle pain can occur with the use of WelChol. Timothy Turner reports that Dr. Mariah Milling had taken Timothy Turner off this medication in the past in an effort to see if this was the cause of his symptoms. Timothy Turner reports there is no change with removing WelChol. Timothy Turner will followup in 3-6 months or earlier if needed.

## 2011-03-19 ENCOUNTER — Other Ambulatory Visit: Payer: Self-pay | Admitting: Internal Medicine

## 2011-03-19 DIAGNOSIS — M791 Myalgia, unspecified site: Secondary | ICD-10-CM

## 2011-03-19 MED ORDER — CLONAZEPAM 0.5 MG PO TABS: 1.0000 mg | ORAL_TABLET | Freq: Three times a day (TID) | ORAL | Status: DC | PRN

## 2011-03-24 ENCOUNTER — Telehealth: Payer: Self-pay | Admitting: Internal Medicine

## 2011-03-24 ENCOUNTER — Other Ambulatory Visit: Payer: Self-pay | Admitting: Internal Medicine

## 2011-03-24 NOTE — Telephone Encounter (Signed)
Pt thought his rx for clonazepam was called [facility] the [facility] [address]  When he went there this morning it is not there please advise pt when rx will be called in

## 2011-07-21 ENCOUNTER — Telehealth: Payer: Self-pay | Admitting: *Deleted

## 2011-07-21 NOTE — Telephone Encounter (Signed)
Patient requesting RF of ambien 90 day supply. OK?

## 2011-07-21 NOTE — Telephone Encounter (Signed)
Ok to fill 90 day

## 2011-07-22 MED ORDER — ZOLPIDEM TARTRATE 10 MG PO TABS: 10.0000 mg | ORAL_TABLET | Freq: Every day | ORAL | Status: DC

## 2011-07-22 NOTE — Telephone Encounter (Signed)
Rx printed, faxed to [facility], Patient informed

## 2011-07-23 ENCOUNTER — Other Ambulatory Visit: Payer: Self-pay | Admitting: Internal Medicine

## 2011-07-24 ENCOUNTER — Other Ambulatory Visit: Payer: Self-pay | Admitting: Internal Medicine

## 2011-07-27 NOTE — Telephone Encounter (Signed)
I have entered refill on this recently. Fine to fill with 3 refills.

## 2011-08-10 ENCOUNTER — Other Ambulatory Visit: Payer: Self-pay | Admitting: Cardiovascular Disease

## 2011-08-12 ENCOUNTER — Encounter: Payer: Self-pay | Admitting: Internal Medicine

## 2011-08-12 ENCOUNTER — Ambulatory Visit (INDEPENDENT_AMBULATORY_CARE_PROVIDER_SITE_OTHER): Payer: Medicare Other | Admitting: Internal Medicine

## 2011-08-12 DIAGNOSIS — M791 Myalgia, unspecified site: Secondary | ICD-10-CM

## 2011-08-12 DIAGNOSIS — IMO0001 Reserved for inherently not codable concepts without codable children: Secondary | ICD-10-CM

## 2011-08-12 DIAGNOSIS — Z23 Encounter for immunization: Secondary | ICD-10-CM

## 2011-08-12 DIAGNOSIS — E7849 Other hyperlipidemia: Secondary | ICD-10-CM | POA: Insufficient documentation

## 2011-08-12 DIAGNOSIS — Z0000 Encounter for general adult medical examination without abnormal findings: Secondary | ICD-10-CM

## 2011-08-12 DIAGNOSIS — G894 Chronic pain syndrome: Secondary | ICD-10-CM | POA: Insufficient documentation

## 2011-08-12 DIAGNOSIS — E785 Hyperlipidemia, unspecified: Secondary | ICD-10-CM

## 2011-08-12 LAB — LIPID PANEL
Cholesterol: 174 mg/dL (ref 0–200)
LDL Cholesterol: 113 mg/dL — ABNORMAL HIGH (ref 0–99)
Triglycerides: 86 mg/dL (ref 0.0–149.0)
VLDL: 17.2 mg/dL (ref 0.0–40.0)

## 2011-08-12 LAB — COMPREHENSIVE METABOLIC PANEL
Alkaline Phosphatase: 65 U/L (ref 39–117)
Glucose, Bld: 101 mg/dL — ABNORMAL HIGH (ref 70–99)
Sodium: 139 mEq/L (ref 135–145)
Total Bilirubin: 0.3 mg/dL (ref 0.3–1.2)
Total Protein: 6.7 g/dL (ref 6.0–8.3)

## 2011-08-12 MED ORDER — CLONAZEPAM 0.5 MG PO TABS: ORAL_TABLET | ORAL | Status: DC

## 2011-08-12 MED ORDER — CLONAZEPAM 1 MG PO TABS: 1.0000 mg | ORAL_TABLET | Freq: Three times a day (TID) | ORAL | Status: DC | PRN

## 2011-08-12 MED ORDER — CLONAZEPAM 1 MG PO TABS: ORAL_TABLET | ORAL | Status: DC

## 2011-08-12 NOTE — Assessment & Plan Note (Addendum)
S/p extensive workup at [facility] for neuromuscular disorder, but etiology never determined. Pain improved with use of Clonazepam.  Will plan to increase nighttime dose to 2mg .  No improvement with holding Welchol, so restarted. Pt will call if any recurrent/progressive symptoms.  Follow up in 6 months and prn.

## 2011-08-12 NOTE — Progress Notes (Signed)
Subjective:    Patient ID: Timothy Turner, male    DOB: [birth year], 71 y.o.   MRN: [id number]  HPI 71YO male with h/o chronic muscular pain s/p extensive workup at [facility] without determination of clear etiology, presents for follow up.  Pt reports significant improvement in muscular cramping with use of Clonazepam.  He denies any noted side effects from this medication. He has been able to increase his activity including walking nearly seven days per week.  He occasionally has cramping pain in his calves at night and questions whether higher dose of clonazepam at night might be helpful.   Outpatient Encounter Prescriptions as of 08/12/2011  Medication Sig Dispense Refill  . amitriptyline (ELAVIL) 50 MG tablet Take 50 mg by mouth at bedtime.        [name] aspirin 81 MG tablet Take 81 mg by mouth daily.        . calcipotriene (DOVONEX) 0.005 % cream Apply topically.        . Cholecalciferol (VITAMIN D HIGH POTENCY) 1000 UNITS capsule Take 1,000 Units by mouth daily. Take one tablet by mouth two times a day.       . clobetasol (TEMOVATE) 0.05 % cream Apply topically 2 (two) times daily.        . clonazePAM (KLONOPIN) 1 MG tablet Take 1mg  twice daily and 2mg  at bedtime  120 tablet  3  . esomeprazole (NEXIUM) 40 MG capsule Take 40 mg by mouth daily before breakfast.        . finasteride (PROSCAR) 5 MG tablet Take 5 mg by mouth daily.        . Glucosamine-Chondroitin (OSTEO BI-FLEX REGULAR STRENGTH) 250-200 MG TABS Take by mouth. Take one tablet daily.       [name] levothyroxine (SYNTHROID, LEVOTHROID) 75 MCG tablet Take 1 tablet (75 mcg total) by mouth daily.  90 tablet  4  . Lipoic Acid 150 MG CAPS Take by mouth. Take one tablet twice a day.       . loratadine (CLARITIN) 10 MG tablet Take 10 mg by mouth daily.        [name] losartan (COZAAR) 100 MG tablet Take 100 mg by mouth daily.        . metoprolol tartrate (LOPRESSOR) 25 MG tablet Take 1 tablet (25 mg total) by mouth 2 (two) times daily as needed.  For palpitations or tachycardia  60 tablet  11  . Multiple Vitamins-Minerals (CENTRUM SILVER PO) Take by mouth.        . niacin (NIASPAN) 1000 MG CR tablet Take 1,000 mg by mouth 2 (two) times daily.        . Omega-3 Fatty Acids (FISH OIL) 1000 MG CAPS Take 1,000 mg by mouth. Take two tablets daily.       . Testosterone (ANDROGEL PUMP) 1.25 GM/ACT (1%) GEL Place onto the skin daily.      Lilian Kapur 625 MG tablet TAKE 3 TABLETS TWICE DAILY  540 tablet  1  . zolpidem (AMBIEN) 10 MG tablet Take 1 tablet (10 mg total) by mouth at bedtime.  90 tablet  1    Review of Systems  Constitutional: Negative for fever, chills, activity change, appetite change, fatigue and unexpected weight change.  Eyes: Negative for visual disturbance.  Respiratory: Negative for cough and shortness of breath.   Cardiovascular: Negative for chest pain, palpitations and leg swelling.  Gastrointestinal: Negative for abdominal pain and abdominal distention.  Genitourinary: Negative for dysuria, urgency and difficulty urinating.  Musculoskeletal:  Positive for myalgias. Negative for arthralgias and gait problem.  Skin: Negative for color change and rash.  Hematological: Negative for adenopathy.  Psychiatric/Behavioral: Negative for sleep disturbance and dysphoric mood. The patient is not nervous/anxious.    BP 130/62  Pulse 86  Temp(Src) 98.3 F (36.8 C) (Oral)  Ht 6' (1.829 m)  Wt 183 lb (83.008 kg)  BMI 24.82 kg/m2  SpO2 95%     Objective:   Physical Exam  Constitutional: He is oriented to person, place, and time. He appears well-developed and well-nourished. No distress.  HENT:  Head: Normocephalic and atraumatic.  Right Ear: External ear normal.  Left Ear: External ear normal.  Nose: Nose normal.  Mouth/Throat: Oropharynx is clear and moist. No oropharyngeal exudate.  Eyes: Conjunctivae and EOM are normal. Pupils are equal, round, and reactive to light. Right eye exhibits no discharge. Left eye exhibits no  discharge. No scleral icterus.  Neck: Normal range of motion. Neck supple. No tracheal deviation present. No thyromegaly present.  Cardiovascular: Normal rate, regular rhythm and normal heart sounds.  Exam reveals no gallop and no friction rub.   No murmur heard. Pulmonary/Chest: Effort normal and breath sounds normal. No respiratory distress. He has no wheezes. He has no rales. He exhibits no tenderness.  Musculoskeletal: Normal range of motion. He exhibits no edema.  Lymphadenopathy:    He has no cervical adenopathy.  Neurological: He is alert and oriented to person, place, and time. No cranial nerve deficit. Coordination normal.  Skin: Skin is warm and dry. No rash noted. He is not diaphoretic. No erythema. No pallor.  Psychiatric: He has a normal mood and affect. His behavior is normal. Judgment and thought content normal.          Assessment & Plan:

## 2011-08-12 NOTE — Patient Instructions (Signed)
Taper Amitriptyline to 50mg  at bedtime.  Increase Clonazepam to 2mg  at bedtime.

## 2011-08-12 NOTE — Assessment & Plan Note (Signed)
Will check CMP and lipids today. Follow up 6 months.

## 2011-09-09 ENCOUNTER — Other Ambulatory Visit: Payer: Self-pay | Admitting: Cardiovascular Disease

## 2011-10-01 ENCOUNTER — Other Ambulatory Visit: Payer: Self-pay | Admitting: Cardiovascular Disease

## 2011-12-21 ENCOUNTER — Other Ambulatory Visit: Payer: Self-pay | Admitting: Internal Medicine

## 2011-12-21 NOTE — Telephone Encounter (Signed)
Rx called to [facility]. 

## 2012-01-12 ENCOUNTER — Telehealth: Payer: Self-pay | Admitting: *Deleted

## 2012-01-12 NOTE — Telephone Encounter (Signed)
signed

## 2012-01-12 NOTE — Telephone Encounter (Signed)
PA form faxed to [facility].

## 2012-01-12 NOTE — Telephone Encounter (Signed)
Patient called to let us know that he needs PA for Zolpidem.  Called [facility] and PA is required because his insurance has a quantity limit on this Rx.  PA form in your red folder, please advise.

## 2012-01-15 ENCOUNTER — Telehealth: Payer: Self-pay | Admitting: *Deleted

## 2012-01-15 NOTE — Telephone Encounter (Signed)
Opened in error

## 2012-01-15 NOTE — Telephone Encounter (Signed)
Received PA approval from [facility] on Zolpidem Tartrate, pharmacy and patient notified.

## 2012-01-26 ENCOUNTER — Other Ambulatory Visit: Payer: Self-pay | Admitting: *Deleted

## 2012-01-26 MED ORDER — ZOLPIDEM TARTRATE 10 MG PO TABS: 10.0000 mg | ORAL_TABLET | Freq: Every day | ORAL | Status: DC

## 2012-01-26 NOTE — Telephone Encounter (Signed)
Rx faxed to [facility], spoke with rep and she stated that their was no Rx on file for Zolpidem.

## 2012-01-27 ENCOUNTER — Other Ambulatory Visit: Payer: Self-pay | Admitting: Internal Medicine

## 2012-01-27 MED ORDER — ZOLPIDEM TARTRATE 10 MG PO TABS: 10.0000 mg | ORAL_TABLET | Freq: Every day | ORAL | Status: DC

## 2012-01-27 NOTE — Telephone Encounter (Signed)
Rx called to [facility] pharmacy, called patient but line was busy.  Will call back tomorrow.

## 2012-01-27 NOTE — Telephone Encounter (Signed)
Pt called checking on his rx for ambeim  i told him it was faxed yesterday to [facility].   Pt just talked to [facility] and they stated they didn't have this rx  Pt wanted to know if you could send a [facility] [address] Pt has only 1 pill left

## 2012-01-28 NOTE — Telephone Encounter (Signed)
Patients spouse advised via telephone that 10 day Rx of Zolpidem called to [facility] and the 90 day Rx has been faxed to [facility] twice and they have the PA on file.

## 2012-02-01 ENCOUNTER — Other Ambulatory Visit (INDEPENDENT_AMBULATORY_CARE_PROVIDER_SITE_OTHER): Payer: Medicare Other | Admitting: *Deleted

## 2012-02-01 ENCOUNTER — Encounter: Payer: Self-pay | Admitting: Cardiovascular Disease

## 2012-02-01 ENCOUNTER — Telehealth: Payer: Self-pay | Admitting: *Deleted

## 2012-02-01 ENCOUNTER — Ambulatory Visit (INDEPENDENT_AMBULATORY_CARE_PROVIDER_SITE_OTHER): Payer: Medicare Other | Admitting: Cardiovascular Disease

## 2012-02-01 VITALS — BP 142/74 | HR 61 | Ht 72.0 in | Wt 158.8 lb

## 2012-02-01 DIAGNOSIS — I10 Essential (primary) hypertension: Secondary | ICD-10-CM

## 2012-02-01 DIAGNOSIS — I739 Peripheral vascular disease, unspecified: Secondary | ICD-10-CM

## 2012-02-01 DIAGNOSIS — IMO0001 Reserved for inherently not codable concepts without codable children: Secondary | ICD-10-CM

## 2012-02-01 DIAGNOSIS — Z87891 Personal history of nicotine dependence: Secondary | ICD-10-CM

## 2012-02-01 DIAGNOSIS — E785 Hyperlipidemia, unspecified: Secondary | ICD-10-CM

## 2012-02-01 DIAGNOSIS — M791 Myalgia, unspecified site: Secondary | ICD-10-CM

## 2012-02-01 LAB — COMPREHENSIVE METABOLIC PANEL
Alkaline Phosphatase: 50 U/L (ref 39–117)
BUN: 19 mg/dL (ref 6–23)
CO2: 28 mEq/L (ref 19–32)
GFR: 53.01 mL/min — ABNORMAL LOW (ref >60.00)
Glucose, Bld: 98 mg/dL (ref 70–99)
Sodium: 140 mEq/L (ref 135–145)
Total Bilirubin: 0.3 mg/dL (ref 0.3–1.2)
Total Protein: 6.5 g/dL (ref 6.0–8.3)

## 2012-02-01 LAB — CBC WITH DIFFERENTIAL/PLATELET
Basophils Relative: 1 % (ref 0.0–3.0)
Eosinophils Relative: 1.3 % (ref 0.0–5.0)
HCT: 37.3 % — ABNORMAL LOW (ref 39.0–52.0)
Hemoglobin: 11.4 g/dL — ABNORMAL LOW (ref 13.0–17.0)
Lymphs Abs: 1.9 10*3/uL (ref 0.7–4.0)
MCV: 74.7 fl — ABNORMAL LOW (ref 78.0–100.0)
Monocytes Absolute: 0.6 10*3/uL (ref 0.1–1.0)
Monocytes Relative: 10.4 % (ref 3.0–12.0)
Neutro Abs: 2.8 10*3/uL (ref 1.4–7.7)
Platelets: 249 10*3/uL (ref 150.0–400.0)
RBC: 4.99 Mil/uL (ref 4.22–5.81)
WBC: 5.4 10*3/uL (ref 4.5–10.5)

## 2012-02-01 LAB — LIPID PANEL
Cholesterol: 133 mg/dL (ref 0–200)
LDL Cholesterol: 79 mg/dL (ref 0–99)
Triglycerides: 53 mg/dL (ref 0.0–149.0)
VLDL: 10.6 mg/dL (ref 0.0–40.0)

## 2012-02-01 NOTE — Progress Notes (Signed)
Patient ID: Timothy Turner, male    DOB: 1940/10/15, 71 y.o.   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 His pain is 69% improved on the current dose. He is more active, is able to walk more. He does continue to have calf muscle pain when he goes up hills. Overall he is very happy about the improvement of his symptoms. He walks approximately 45 minutes per day. He states that his blood pressure is well controlled on his current medications   He denies any significant chest pain, lightheadedness, near syncope or syncope.   EKG shows normal sinus rhythm with rate of 61 beats per minute, no significant ST or T wave changes, first degree AV block      Outpatient Encounter Prescriptions as of 02/01/2012  Medication Sig Dispense Refill  . aspirin 81 MG tablet Take 81 mg by mouth daily.        . calcipotriene (DOVONEX) 0.005 % cream Apply topically.        . Cholecalciferol (VITAMIN D HIGH POTENCY) 1000 UNITS capsule Take 1,000 Units by mouth daily. Take one tablet by mouth two times a day.       . clobetasol (TEMOVATE) 0.05 % cream Apply topically 2 (two) times daily.        . clonazePAM (KLONOPIN) 1 MG tablet TAKE ONE TABLET BY MOUTH TWICE DAILY AND TWO TABLETS BY MOUTH AT BEDTIME  120 tablet  3  . esomeprazole (NEXIUM) 40 MG capsule Take 40 mg by mouth daily before breakfast.        . finasteride (PROSCAR) 5 MG tablet Take 5 mg by mouth daily.        . Glucosamine-Chondroitin (OSTEO BI-FLEX REGULAR STRENGTH) 250-200 MG TABS Take by mouth. Take one tablet daily.       [name] levothyroxine  (SYNTHROID, LEVOTHROID) 75 MCG tablet Take 1 tablet (75 mcg total) by mouth daily.  90 tablet  4  . loratadine (CLARITIN) 10 MG tablet Take 10 mg by mouth daily.        [name] losartan (COZAAR) 100 MG tablet TAKE 1 TABLET DAILY  90 tablet  3  . Multiple Vitamins-Minerals (CENTRUM SILVER PO) Take by mouth.        . naproxen sodium (ANAPROX) 220 MG tablet Take 220 mg by mouth 2 (two) times daily with a meal.      . NIASPAN 1000 MG CR tablet TAKE 2 TABLETS ONCE DAILY  180 tablet  3  . Omega-3 Fatty Acids (FISH OIL) 1000 MG CAPS Take 1,000 mg by mouth. Take two tablets daily.       . Testosterone (ANDROGEL PUMP) 1.25 GM/ACT (1%) GEL Place onto the skin daily.      Lilian Kapur 625 MG tablet TAKE 3 TABLETS TWICE DAILY  540 tablet  1  . zolpidem (AMBIEN) 10 MG tablet Take 1 tablet (10 mg total) by mouth at bedtime.  10 tablet  0  . metoprolol tartrate (LOPRESSOR) 25 MG tablet Take 1 tablet (25 mg total) by mouth 2 (two) times daily as needed. For palpitations or tachycardia  60 tablet  11    Review  of Systems  Constitutional: Negative.   HENT: Negative.   Eyes: Negative.   Respiratory: Negative.   Gastrointestinal: Negative.   Musculoskeletal: Positive for myalgias.  Skin: Negative.   Neurological: Negative.   Hematological: Negative.   Psychiatric/Behavioral: Negative.   All other systems reviewed and are negative.    BP 142/74  Pulse 61  Ht 6' (1.829 m)  Wt 158 lb 12 oz (72.009 kg)  BMI 21.53 kg/m2  Physical Exam  Nursing note and vitals reviewed. Constitutional: He is oriented to person, place, and time. He appears well-developed and well-nourished.  HENT:  Head: Normocephalic.  Nose: Nose normal.  Mouth/Throat: Oropharynx is clear and moist.  Eyes: Conjunctivae are normal. Pupils are equal, round, and reactive to light.  Neck: Normal range of motion. Neck supple. No JVD present.  Cardiovascular: Normal rate, regular rhythm, S1 normal, S2 normal, normal heart sounds and intact distal  pulses.  Exam reveals no gallop and no friction rub.   No murmur heard. Pulmonary/Chest: Effort normal and breath sounds normal. No respiratory distress. He has no wheezes. He has no rales. He exhibits no tenderness.  Abdominal: Soft. Bowel sounds are normal. He exhibits no distension. There is no tenderness.  Musculoskeletal: Normal range of motion. He exhibits no edema and no tenderness.  Lymphadenopathy:    He has no cervical adenopathy.  Neurological: He is alert and oriented to person, place, and time. Coordination normal.  Skin: Skin is warm and dry. No rash noted. No erythema.  Psychiatric: He has a normal mood and affect. His behavior is normal. Judgment and thought content normal.           Assessment and Plan

## 2012-02-01 NOTE — Assessment & Plan Note (Signed)
He feels that his blood pressure is low and he is taking too much medicine. We have suggested he could try to cut his losartan in half, and take 50 mg daily. If blood pressure continues to be well-controlled, he could potentially decrease the dose to 25 mg daily. He has been more active recently, increased muscle mass, feeling fitter. This could potentially be helping his blood pressure.

## 2012-02-01 NOTE — Telephone Encounter (Signed)
Ordered. thanks

## 2012-02-01 NOTE — Patient Instructions (Addendum)
You are doing well. Please cut the losartan in 1/2, to 50 mg a day Monitor your blood pressure  Please call us if you have new issues that need to be addressed before your next appt.  Your physician wants you to follow-up in: 12 months.  You will receive a reminder letter in the mail two months in advance. If you don't receive a letter, please call our office to schedule the follow-up appointment.

## 2012-02-01 NOTE — Telephone Encounter (Signed)
Patient came in for labs cpx lab today, what do you want me to order?

## 2012-02-01 NOTE — Telephone Encounter (Signed)
CBC, CMP, lipids 

## 2012-02-01 NOTE — Assessment & Plan Note (Signed)
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

## 2012-02-01 NOTE — Assessment & Plan Note (Signed)
Muscle pain improved on clonazepam. He is much more active.

## 2012-02-01 NOTE — Assessment & Plan Note (Signed)
He appears to be tolerating WelChol and Niaspan.

## 2012-02-01 NOTE — Assessment & Plan Note (Signed)
Previous 30 year smoking history.

## 2012-02-05 ENCOUNTER — Other Ambulatory Visit: Payer: Self-pay | Admitting: Internal Medicine

## 2012-02-05 DIAGNOSIS — Z1211 Encounter for screening for malignant neoplasm of colon: Secondary | ICD-10-CM

## 2012-02-08 ENCOUNTER — Other Ambulatory Visit: Payer: Medicare Other

## 2012-02-08 DIAGNOSIS — Z1211 Encounter for screening for malignant neoplasm of colon: Secondary | ICD-10-CM

## 2012-02-08 LAB — FECAL OCCULT BLOOD, IMMUNOCHEMICAL: Fecal Occult Bld: NEGATIVE

## 2012-02-10 ENCOUNTER — Ambulatory Visit: Payer: Medicare Other | Admitting: Internal Medicine

## 2012-02-10 ENCOUNTER — Ambulatory Visit (INDEPENDENT_AMBULATORY_CARE_PROVIDER_SITE_OTHER): Payer: Medicare Other | Admitting: Internal Medicine

## 2012-02-10 ENCOUNTER — Encounter: Payer: Self-pay | Admitting: Internal Medicine

## 2012-02-10 VITALS — BP 142/70 | HR 60 | Temp 98.0°F | Resp 16 | Ht 72.0 in | Wt 190.2 lb

## 2012-02-10 DIAGNOSIS — Z0000 Encounter for general adult medical examination without abnormal findings: Secondary | ICD-10-CM

## 2012-02-10 DIAGNOSIS — D509 Iron deficiency anemia, unspecified: Secondary | ICD-10-CM | POA: Insufficient documentation

## 2012-02-10 DIAGNOSIS — Z23 Encounter for immunization: Secondary | ICD-10-CM

## 2012-02-10 HISTORY — DX: Iron deficiency anemia, unspecified: D50.9

## 2012-02-10 NOTE — Assessment & Plan Note (Signed)
General exam normal today. Health maintenance is up to date with the exception of tetanus and pertussis vaccine which was given. Colonoscopy is pending for next month. Labs were remarkable for iron deficiency anemia which was discussed today. Stool Hemoccult testing was negative however GI blood loss still seems like most likely source. He will return to clinic in one month.

## 2012-02-10 NOTE — Assessment & Plan Note (Signed)
Iron deficiency anemia noted on labs. Stool Hemoccult testing was negative, however GI blood loss seems like most likely source. Colonoscopy is scheduled for 2 weeks from now. Will follow.

## 2012-02-10 NOTE — Progress Notes (Signed)
Subjective:    Patient ID: Timothy Turner, male    DOB: [birth year], 71 y.o.   MRN: [id number]  HPI The patient is here for annual Medicare wellness examination and management of other chronic and acute problems.   The risk factors are reflected in the social history.  The roster of all physicians providing medical care to patient - is listed in the Snapshot section of the chart.  Activities of daily living:  The patient is 100% independent in all ADLs: dressing, toileting, feeding as well as independent mobility  Home safety : The patient has smoke detectors in the home. They wear seatbelts.  There are firearms at home, which are locked. There is no violence in the home.   There is no risks for hepatitis, STDs or HIV. There is a history of blood transfusion in 2005 after colonoscopy. They have no travel history to infectious disease endemic areas of the world.  The patient has not seen their dentist in the last six month (has dentures).  They have seen their eye doctor in the last year (Dr. Oren Bracket).  They deny issues with hearing.  They have deferred audiologic testing in the last year.    They do not  have excessive sun exposure. Discussed the need for sun protection: hats, long sleeves and use of sunscreen if there is significant sun exposure.   Diet: the importance of a healthy diet is discussed. They do have a healthy diet.  The benefits of regular aerobic exercise were discussed. He exercises by walking 40-45 min per day.   Depression screen: there are no signs or vegative symptoms of depression- irritability, change in appetite, anhedonia, sadness/tearfullness.  Cognitive assessment: the patient manages all their financial and personal affairs and is actively engaged. They could relate day,date,year and events.  The following portions of the patient's history were reviewed and updated as appropriate: allergies, current medications, past family history, past medical history,   past surgical history, past social history  and problem list.  Visual acuity was not assessed per patient preference since he has regular follow up with her ophthalmologist. Hearing and body mass index were assessed and reviewed.   During the course of the visit the patient was educated and counseled about appropriate screening and preventive services including : fall prevention , diabetes screening, nutrition counseling, colorectal cancer screening, and recommended immunizations.    In regards to hypertension, patient notes that dose of losartan was recently decreased to half tablet, 50 mg daily. His cardiologist reduce the dose because he was having some episodes of hypotension with lightheadedness. He reports that symptoms are improved and blood pressure typically well-controlled near 110/60.  In regards to chronic muscle pain, he reports that recently symptoms are improved. He continues to use Clonopin as needed. He has been very active, walking 45 minutes daily. He reports some occasional cramping in his calves but this is tolerable.  He was noted on recent labs to have mild anemia and low ferritin consistent with iron deficiency. He was started on iron supplementation and had testing of the stool for blood which was negative. He notes that he has followup with GI in 2 weeks for upper and lower endoscopy. He denies any change in his bowel habits, black stool, abdominal pain, nausea. He reports healthy diet including lean meats.  Outpatient Encounter Prescriptions as of 02/10/2012  Medication Sig Dispense Refill  . aspirin 81 MG tablet Take 81 mg by mouth daily.        [name]  calcipotriene (DOVONEX) 0.005 % cream Apply topically.        . Cholecalciferol (VITAMIN D HIGH POTENCY) 1000 UNITS capsule Take 1,000 Units by mouth daily. Take one tablet by mouth two times a day.       . clobetasol (TEMOVATE) 0.05 % cream Apply topically 2 (two) times daily.        . clonazePAM (KLONOPIN) 1 MG tablet TAKE ONE  TABLET BY MOUTH TWICE DAILY AND TWO TABLETS BY MOUTH AT BEDTIME  120 tablet  3  . esomeprazole (NEXIUM) 40 MG capsule Take 40 mg by mouth daily before breakfast.        . finasteride (PROSCAR) 5 MG tablet Take 5 mg by mouth daily.        . Glucosamine-Chondroitin (OSTEO BI-FLEX REGULAR STRENGTH) 250-200 MG TABS Take by mouth. Take one tablet daily.       [name] levothyroxine (SYNTHROID, LEVOTHROID) 75 MCG tablet Take 1 tablet (75 mcg total) by mouth daily.  90 tablet  4  . loratadine (CLARITIN) 10 MG tablet Take 10 mg by mouth daily.        [name] losartan (COZAAR) 100 MG tablet TAKE 1 TABLET DAILY  90 tablet  3  . Multiple Vitamins-Minerals (CENTRUM SILVER PO) Take by mouth.        . naproxen sodium (ANAPROX) 220 MG tablet Take 220 mg by mouth 2 (two) times daily with a meal.      . NIASPAN 1000 MG CR tablet TAKE 2 TABLETS ONCE DAILY  180 tablet  3  . Omega-3 Fatty Acids (FISH OIL) 1000 MG CAPS Take 1,000 mg by mouth. Take two tablets daily.       . Testosterone (ANDROGEL PUMP) 1.25 GM/ACT (1%) GEL Place onto the skin daily.      Lilian Kapur 625 MG tablet TAKE 3 TABLETS TWICE DAILY  540 tablet  1  . zolpidem (AMBIEN) 10 MG tablet Take 1 tablet (10 mg total) by mouth at bedtime.  10 tablet  0  . DISCONTD: metoprolol tartrate (LOPRESSOR) 25 MG tablet Take 1 tablet (25 mg total) by mouth 2 (two) times daily as needed. For palpitations or tachycardia  60 tablet  11   BP 142/70  Pulse 60  Temp 98 F (36.7 C) (Oral)  Resp 16  Ht 6' (1.829 m)  Wt 190 lb 4 oz (86.297 kg)  BMI 25.80 kg/m2  SpO2 97%   Review of Systems  Constitutional: Negative for fever, chills, activity change, appetite change, fatigue and unexpected weight change.  Eyes: Negative for visual disturbance.  Respiratory: Negative for cough and shortness of breath.   Cardiovascular: Negative for chest pain, palpitations and leg swelling.  Gastrointestinal: Negative for abdominal pain and abdominal distention.  Genitourinary: Negative for  dysuria, urgency and difficulty urinating.  Musculoskeletal: Positive for myalgias. Negative for arthralgias and gait problem.  Skin: Negative for color change and rash.  Hematological: Negative for adenopathy.  Psychiatric/Behavioral: Negative for disturbed wake/sleep cycle and dysphoric mood. The patient is not nervous/anxious.        Objective:   Physical Exam  Constitutional: He is oriented to person, place, and time. He appears well-developed and well-nourished. No distress.  HENT:  Head: Normocephalic and atraumatic.  Right Ear: External ear normal.  Left Ear: External ear normal.  Nose: Nose normal.  Mouth/Throat: Oropharynx is clear and moist. No oropharyngeal exudate.  Eyes: Conjunctivae and EOM are normal. Pupils are equal, round, and reactive to light. Right eye exhibits no discharge.  Left eye exhibits no discharge. No scleral icterus.  Neck: Normal range of motion. Neck supple. No tracheal deviation present. No thyromegaly present.  Cardiovascular: Normal rate, regular rhythm and normal heart sounds.  Exam reveals no gallop and no friction rub.   No murmur heard. Pulmonary/Chest: Effort normal and breath sounds normal. No respiratory distress. He has no wheezes. He has no rales. He exhibits no tenderness.  Abdominal: Soft. Bowel sounds are normal. He exhibits no distension and no mass. There is no tenderness. There is no guarding.  Musculoskeletal: Normal range of motion. He exhibits no edema.  Lymphadenopathy:    He has no cervical adenopathy.  Neurological: He is alert and oriented to person, place, and time. No cranial nerve deficit. Coordination normal.  Skin: Skin is warm and dry. No rash noted. He is not diaphoretic. No erythema. No pallor.  Psychiatric: He has a normal mood and affect. His behavior is normal. Judgment and thought content normal.          Assessment & Plan:

## 2012-03-01 DIAGNOSIS — N529 Male erectile dysfunction, unspecified: Secondary | ICD-10-CM | POA: Insufficient documentation

## 2012-03-01 DIAGNOSIS — N138 Other obstructive and reflux uropathy: Secondary | ICD-10-CM | POA: Insufficient documentation

## 2012-03-01 DIAGNOSIS — R3911 Hesitancy of micturition: Secondary | ICD-10-CM | POA: Insufficient documentation

## 2012-03-01 DIAGNOSIS — R972 Elevated prostate specific antigen [PSA]: Secondary | ICD-10-CM | POA: Insufficient documentation

## 2012-03-01 DIAGNOSIS — N400 Enlarged prostate without lower urinary tract symptoms: Secondary | ICD-10-CM | POA: Insufficient documentation

## 2012-03-01 DIAGNOSIS — D075 Carcinoma in situ of prostate: Secondary | ICD-10-CM | POA: Insufficient documentation

## 2012-03-01 DIAGNOSIS — N403 Nodular prostate with lower urinary tract symptoms: Secondary | ICD-10-CM

## 2012-03-01 DIAGNOSIS — E291 Testicular hypofunction: Secondary | ICD-10-CM | POA: Insufficient documentation

## 2012-03-04 ENCOUNTER — Other Ambulatory Visit (INDEPENDENT_AMBULATORY_CARE_PROVIDER_SITE_OTHER): Payer: Medicare Other

## 2012-03-04 DIAGNOSIS — D509 Iron deficiency anemia, unspecified: Secondary | ICD-10-CM

## 2012-03-04 LAB — CBC WITH DIFFERENTIAL/PLATELET
Basophils Relative: 1.2 % (ref 0.0–3.0)
Eosinophils Relative: 2.9 % (ref 0.0–5.0)
HCT: 45.9 % (ref 39.0–52.0)
Lymphs Abs: 1.6 10*3/uL (ref 0.7–4.0)
MCHC: 30.9 g/dL (ref 30.0–36.0)
MCV: 82.3 fl (ref 78.0–100.0)
Monocytes Absolute: 0.7 10*3/uL (ref 0.1–1.0)
Platelets: 181 10*3/uL (ref 150.0–400.0)
RBC: 5.57 Mil/uL (ref 4.22–5.81)
WBC: 6.1 10*3/uL (ref 4.5–10.5)

## 2012-03-08 ENCOUNTER — Ambulatory Visit: Payer: Self-pay | Admitting: Gastroenterology

## 2012-03-08 ENCOUNTER — Other Ambulatory Visit: Payer: Medicare Other

## 2012-03-08 HISTORY — PX: COLONOSCOPY: SHX174

## 2012-03-08 LAB — HM COLONOSCOPY

## 2012-03-10 LAB — PATHOLOGY REPORT

## 2012-03-14 ENCOUNTER — Ambulatory Visit (INDEPENDENT_AMBULATORY_CARE_PROVIDER_SITE_OTHER): Payer: Medicare Other | Admitting: Internal Medicine

## 2012-03-14 ENCOUNTER — Encounter: Payer: Self-pay | Admitting: Internal Medicine

## 2012-03-14 VITALS — BP 130/80 | HR 62 | Temp 98.3°F | Ht 72.0 in | Wt 184.2 lb

## 2012-03-14 DIAGNOSIS — I10 Essential (primary) hypertension: Secondary | ICD-10-CM

## 2012-03-14 DIAGNOSIS — M791 Myalgia, unspecified site: Secondary | ICD-10-CM

## 2012-03-14 DIAGNOSIS — D509 Iron deficiency anemia, unspecified: Secondary | ICD-10-CM

## 2012-03-14 DIAGNOSIS — IMO0001 Reserved for inherently not codable concepts without codable children: Secondary | ICD-10-CM

## 2012-03-14 DIAGNOSIS — Z23 Encounter for immunization: Secondary | ICD-10-CM

## 2012-03-14 DIAGNOSIS — C61 Malignant neoplasm of prostate: Secondary | ICD-10-CM | POA: Insufficient documentation

## 2012-03-14 NOTE — Assessment & Plan Note (Signed)
Recent blood counts improved with Hgb 14. Results of colonoscopy pending. Will request pathology on biopsies once available.

## 2012-03-14 NOTE — Assessment & Plan Note (Signed)
Symptoms well-controlled on Klonopin. Will continue.

## 2012-03-14 NOTE — Assessment & Plan Note (Signed)
Blood pressure well-controlled on current medication. Will continue. Recent renal function was normal.

## 2012-03-14 NOTE — Progress Notes (Signed)
Subjective:    Patient ID: Timothy Turner, male    DOB: 03/12/1941, 71 y.o.   MRN: [id number]  HPI 71 year old male with history of chronic muscle pain, impotence, hypertension presents for followup. He reports that since his last visit he was seen by his urologist and diagnosed with elevated PSA. He had additional urine testing to look for prostate cancer which he reports was positive. Biopsy of the prostate gland is scheduled for this week.  At his last visit, there was concern about iron deficiency anemia. Blood counts recently have significantly improved with hemoglobin of 14. He reports that he had a colonoscopy and results of biopsy of several polyps is still pending. He denies any black stool, blood in his stool, abdominal pain.  In regards to chronic muscular pain and weakness he reports that symptoms continue to be well-controlled with Klonopin. In regards to chronic issues of hypertension and hypothyroidism he also reports full compliance with medications.  He notes that, over the last few weeks he has had a few enlarged lymph nodes in his neck in the setting of an upper respiratory infection. Symptoms of sore throat and nasal congestion have resolved and lymph nodes seem to be smaller in size.  Outpatient Encounter Prescriptions as of 03/14/2012  Medication Sig Dispense Refill  . aspirin 81 MG tablet Take 81 mg by mouth daily.        . calcipotriene (DOVONEX) 0.005 % cream Apply topically.        . Cholecalciferol (VITAMIN D HIGH POTENCY) 1000 UNITS capsule Take 1,000 Units by mouth daily. Take one tablet by mouth two times a day.       . clobetasol (TEMOVATE) 0.05 % cream Apply topically 2 (two) times daily.        . clonazePAM (KLONOPIN) 1 MG tablet TAKE ONE TABLET BY MOUTH TWICE DAILY AND TWO TABLETS BY MOUTH AT BEDTIME  120 tablet  3  . esomeprazole (NEXIUM) 40 MG capsule Take 40 mg by mouth daily before breakfast.        . finasteride (PROSCAR) 5 MG tablet Take 5 mg by mouth  daily.        . Glucosamine-Chondroitin (OSTEO BI-FLEX REGULAR STRENGTH) 250-200 MG TABS Take by mouth. Take one tablet daily.       [name] levothyroxine (SYNTHROID, LEVOTHROID) 75 MCG tablet Take 1 tablet (75 mcg total) by mouth daily.  90 tablet  4  . loratadine (CLARITIN) 10 MG tablet Take 10 mg by mouth daily.        . Multiple Vitamins-Minerals (CENTRUM SILVER PO) Take by mouth.        . naproxen sodium (ANAPROX) 220 MG tablet Take 220 mg by mouth 2 (two) times daily with a meal.      . NIASPAN 1000 MG CR tablet TAKE 2 TABLETS ONCE DAILY  180 tablet  3  . Omega-3 Fatty Acids (FISH OIL) 1000 MG CAPS Take 1,000 mg by mouth. Take two tablets daily.       . Testosterone (ANDROGEL PUMP) 1.25 GM/ACT (1%) GEL Place onto the skin daily.      Lilian Kapur 625 MG tablet TAKE 3 TABLETS TWICE DAILY  540 tablet  1  . zolpidem (AMBIEN) 10 MG tablet Take 1 tablet (10 mg total) by mouth at bedtime.  10 tablet  0  . losartan (COZAAR) 100 MG tablet TAKE 1 TABLET DAILY  90 tablet  3    Review of Systems  Constitutional: Positive for fatigue. Negative for fever,  chills, activity change, appetite change and unexpected weight change.  Eyes: Negative for visual disturbance.  Respiratory: Negative for cough and shortness of breath.   Cardiovascular: Negative for chest pain, palpitations and leg swelling.  Gastrointestinal: Negative for abdominal pain and abdominal distention.  Genitourinary: Negative for dysuria, urgency and difficulty urinating.  Musculoskeletal: Positive for myalgias. Negative for arthralgias and gait problem.  Skin: Negative for color change and rash.  Hematological: Positive for adenopathy (anterior cervical).  Psychiatric/Behavioral: Negative for disturbed wake/sleep cycle and dysphoric mood. The patient is not nervous/anxious.        Objective:   Physical Exam  Constitutional: He is oriented to person, place, and time. He appears well-developed and well-nourished. No distress.  HENT:    Head: Normocephalic and atraumatic.  Right Ear: External ear normal.  Left Ear: External ear normal.  Nose: Nose normal.  Mouth/Throat: Oropharynx is clear and moist. No oropharyngeal exudate.  Eyes: Conjunctivae normal and EOM are normal. Pupils are equal, round, and reactive to light. Right eye exhibits no discharge. Left eye exhibits no discharge. No scleral icterus.  Neck: Normal range of motion. Neck supple. No tracheal deviation present. No thyromegaly present.  Cardiovascular: Normal rate, regular rhythm and normal heart sounds.  Exam reveals no gallop and no friction rub.   No murmur heard. Pulmonary/Chest: Effort normal and breath sounds normal. No respiratory distress. He has no wheezes. He has no rales. He exhibits no tenderness.  Musculoskeletal: Normal range of motion. He exhibits no edema.  Lymphadenopathy:    He has cervical adenopathy.       Right cervical: Superficial cervical adenopathy present.       Left cervical: Superficial cervical adenopathy present.       Few small shoddy superficial cervical nodes bilaterally  Neurological: He is alert and oriented to person, place, and time. No cranial nerve deficit. Coordination normal.  Skin: Skin is warm and dry. No rash noted. He is not diaphoretic. No erythema. No pallor.  Psychiatric: He has a normal mood and affect. His behavior is normal. Judgment and thought content normal.          Assessment & Plan:

## 2012-03-14 NOTE — Assessment & Plan Note (Signed)
Pt reports recent diagnosis of prostate CA by urine testing with his urologist. Plan for biopsy this week. Will request results on this.

## 2012-04-01 ENCOUNTER — Encounter: Payer: Self-pay | Admitting: Internal Medicine

## 2012-04-03 ENCOUNTER — Other Ambulatory Visit: Payer: Self-pay | Admitting: Cardiovascular Disease

## 2012-04-04 ENCOUNTER — Other Ambulatory Visit: Payer: Self-pay | Admitting: *Deleted

## 2012-04-04 MED ORDER — COLESEVELAM HCL 625 MG PO TABS: ORAL_TABLET | ORAL | Status: DC

## 2012-04-04 NOTE — Telephone Encounter (Signed)
Refilled Welchol.

## 2012-04-12 ENCOUNTER — Other Ambulatory Visit: Payer: Self-pay | Admitting: Internal Medicine

## 2012-04-13 NOTE — Telephone Encounter (Signed)
Rx called to [facility]

## 2012-05-23 ENCOUNTER — Other Ambulatory Visit: Payer: Self-pay | Admitting: Internal Medicine

## 2012-05-23 NOTE — Telephone Encounter (Signed)
Med filled.  

## 2012-06-07 ENCOUNTER — Encounter: Payer: Self-pay | Admitting: Internal Medicine

## 2012-06-07 ENCOUNTER — Ambulatory Visit (INDEPENDENT_AMBULATORY_CARE_PROVIDER_SITE_OTHER): Payer: Medicare Other | Admitting: Internal Medicine

## 2012-06-07 VITALS — BP 130/80 | HR 57 | Temp 98.0°F | Resp 16 | Wt 186.8 lb

## 2012-06-07 DIAGNOSIS — D509 Iron deficiency anemia, unspecified: Secondary | ICD-10-CM

## 2012-06-07 DIAGNOSIS — I10 Essential (primary) hypertension: Secondary | ICD-10-CM

## 2012-06-07 DIAGNOSIS — E039 Hypothyroidism, unspecified: Secondary | ICD-10-CM

## 2012-06-07 DIAGNOSIS — E785 Hyperlipidemia, unspecified: Secondary | ICD-10-CM

## 2012-06-07 DIAGNOSIS — Z1331 Encounter for screening for depression: Secondary | ICD-10-CM

## 2012-06-07 DIAGNOSIS — M791 Myalgia, unspecified site: Secondary | ICD-10-CM

## 2012-06-07 DIAGNOSIS — IMO0001 Reserved for inherently not codable concepts without codable children: Secondary | ICD-10-CM

## 2012-06-07 LAB — COMPREHENSIVE METABOLIC PANEL
ALT: 19 U/L (ref 0–53)
BUN: 20 mg/dL (ref 6–23)
CO2: 29 mEq/L (ref 19–32)
Calcium: 9.2 mg/dL (ref 8.4–10.5)
Chloride: 105 mEq/L (ref 96–112)
Creatinine, Ser: 1.3 mg/dL (ref 0.4–1.5)
GFR: 58.16 mL/min — ABNORMAL LOW (ref >60.00)
Glucose, Bld: 99 mg/dL (ref 70–99)
Total Bilirubin: 0.8 mg/dL (ref 0.3–1.2)

## 2012-06-07 LAB — CBC WITH DIFFERENTIAL/PLATELET
Basophils Absolute: 0 10*3/uL (ref 0.0–0.1)
Basophils Relative: 0.5 % (ref 0.0–3.0)
Eosinophils Relative: 2.2 % (ref 0.0–5.0)
HCT: 45.8 % (ref 39.0–52.0)
Hemoglobin: 15.1 g/dL (ref 13.0–17.0)
Lymphocytes Relative: 29.8 % (ref 12.0–46.0)
Lymphs Abs: 2.2 10*3/uL (ref 0.7–4.0)
Monocytes Relative: 12.5 % — ABNORMAL HIGH (ref 3.0–12.0)
Neutro Abs: 4.1 10*3/uL (ref 1.4–7.7)
RBC: 5.2 Mil/uL (ref 4.22–5.81)
WBC: 7.5 10*3/uL (ref 4.5–10.5)

## 2012-06-07 NOTE — Assessment & Plan Note (Addendum)
Blood pressure well-controlled on current medications. Will continue. Renal function normal on labs today. Followup in 6 months or as needed.

## 2012-06-07 NOTE — Progress Notes (Signed)
Subjective:    Patient ID: Timothy Turner, male    DOB: 31-Jul-1940, 71 y.o.   MRN: [id number]  HPI 71 year old male with history of chronic muscle pain, Barretts esophagus, osteoarthritis, hypertension, hyperlipidemia presents for followup. He reports he is generally been feeling well. He is trying to increase his physical activity by walking more often. Muscle pain has been well controlled with Klonopin. He denies any change in energy level, chest pain, shortness of breath, fatigue. He reports that recent upper and lower endoscopy were stable. He was told he can have repeat colonoscopy in 5 years. He is typically followed every one to 2 years for upper endoscopy because of Barrett's esophagus. He reports compliance with his Nexium.  Outpatient Encounter Prescriptions as of 06/07/2012  Medication Sig Dispense Refill  . aspirin 81 MG tablet Take 81 mg by mouth daily.        . calcipotriene (DOVONEX) 0.005 % cream Apply topically.        . Cholecalciferol (VITAMIN D HIGH POTENCY) 1000 UNITS capsule Take 1,000 Units by mouth daily. Take one tablet by mouth two times a day.       . clobetasol (TEMOVATE) 0.05 % cream Apply topically 2 (two) times daily.        . clonazePAM (KLONOPIN) 1 MG tablet TAKE 1 TABLET BY MOUTH TWICE DAILY AND 2 TABLETS AT BEDTIME  120 tablet  2  . colesevelam (WELCHOL) 625 MG tablet Take 3 tablets twice daily.  540 tablet  1  . esomeprazole (NEXIUM) 40 MG capsule Take 40 mg by mouth daily before breakfast.        . finasteride (PROSCAR) 5 MG tablet Take 5 mg by mouth daily.        . Glucosamine-Chondroitin (OSTEO BI-FLEX REGULAR STRENGTH) 250-200 MG TABS Take by mouth. Take one tablet daily.       [name] levothyroxine (SYNTHROID, LEVOTHROID) 75 MCG tablet TAKE 1 TABLET DAILY  90 tablet  3  . loratadine (CLARITIN) 10 MG tablet Take 10 mg by mouth daily.        . Multiple Vitamins-Minerals (CENTRUM SILVER PO) Take by mouth.        . naproxen sodium (ANAPROX) 220 MG tablet Take  220 mg by mouth 2 (two) times daily with a meal.      . NIASPAN 1000 MG CR tablet TAKE 2 TABLETS ONCE DAILY  180 tablet  3  . Omega-3 Fatty Acids (FISH OIL) 1000 MG CAPS Take 1,000 mg by mouth. Take two tablets daily.       . Testosterone (ANDROGEL PUMP) 1.25 GM/ACT (1%) GEL Place onto the skin daily.      [name] zolpidem (AMBIEN) 10 MG tablet Take 1 tablet (10 mg total) by mouth at bedtime.  10 tablet  0  . losartan (COZAAR) 100 MG tablet TAKE 1 TABLET DAILY  90 tablet  3   BP 130/80  Pulse 57  Temp 98 F (36.7 C) (Oral)  Resp 16  Wt 186 lb 12 oz (84.709 kg)  Review of Systems  Constitutional: Negative for fever, chills, activity change, appetite change, fatigue and unexpected weight change.  Eyes: Negative for visual disturbance.  Respiratory: Negative for cough and shortness of breath.   Cardiovascular: Negative for chest pain, palpitations and leg swelling.  Gastrointestinal: Negative for abdominal pain and abdominal distention.  Genitourinary: Negative for dysuria, urgency and difficulty urinating.  Musculoskeletal: Positive for myalgias. Negative for arthralgias and gait problem.  Skin: Negative for color change and rash.  Hematological: Negative for adenopathy.  Psychiatric/Behavioral: Negative for sleep disturbance and dysphoric mood. The patient is not nervous/anxious.        Objective:   Physical Exam  Constitutional: He is oriented to person, place, and time. He appears well-developed and well-nourished. No distress.  HENT:  Head: Normocephalic and atraumatic.  Right Ear: External ear normal.  Left Ear: External ear normal.  Nose: Nose normal.  Mouth/Throat: Oropharynx is clear and moist. No oropharyngeal exudate.  Eyes: Conjunctivae normal and EOM are normal. Pupils are equal, round, and reactive to light. Right eye exhibits no discharge. Left eye exhibits no discharge. No scleral icterus.  Neck: Normal range of motion. Neck supple. No tracheal deviation present. No  thyromegaly present.  Cardiovascular: Normal rate, regular rhythm and normal heart sounds.  Exam reveals no gallop and no friction rub.   No murmur heard. Pulmonary/Chest: Effort normal and breath sounds normal. No respiratory distress. He has no wheezes. He has no rales. He exhibits no tenderness.  Musculoskeletal: Normal range of motion. He exhibits no edema.  Lymphadenopathy:    He has no cervical adenopathy.  Neurological: He is alert and oriented to person, place, and time. No cranial nerve deficit. Coordination normal.  Skin: Skin is warm and dry. No rash noted. He is not diaphoretic. No erythema. No pallor.  Psychiatric: He has a normal mood and affect. His behavior is normal. Judgment and thought content normal.          Assessment & Plan:

## 2012-06-07 NOTE — Assessment & Plan Note (Signed)
TSH normal on labs today. Continue Synthroid.

## 2012-06-07 NOTE — Assessment & Plan Note (Signed)
Lipids have been well-controlled on current medication. Patient has been unable to take statin medications because of chronic myalgia. Continue current medication. Follow up 6 months or sooner if needed.

## 2012-06-07 NOTE — Assessment & Plan Note (Signed)
Labs today show improvement and CBC and ferritin. Will stop iron supplementation. Will continue to monitor CBC with repeat labs in 6 months.

## 2012-07-13 ENCOUNTER — Other Ambulatory Visit: Payer: Self-pay | Admitting: Internal Medicine

## 2012-07-13 NOTE — Telephone Encounter (Signed)
Pt asks for Zolpidem sleeping pill refill 90 day supply.

## 2012-07-13 NOTE — Telephone Encounter (Signed)
Called patient to find out what pharmacy he uses. Was advised by patient that he is not sure of the name of his pharmacy, but will find out and contact them and have them request the medication refill for him.

## 2012-07-14 ENCOUNTER — Other Ambulatory Visit: Payer: Self-pay | Admitting: Internal Medicine

## 2012-07-14 MED ORDER — ZOLPIDEM TARTRATE 10 MG PO TABS: 10.0000 mg | ORAL_TABLET | Freq: Every day | ORAL | Status: DC

## 2012-07-14 NOTE — Telephone Encounter (Signed)
Rx faxed to pharmacy by Lupita Leash.

## 2012-07-14 NOTE — Telephone Encounter (Signed)
Is it okay to refill medication? 

## 2012-07-14 NOTE — Telephone Encounter (Signed)
Rx faxed to pharmacy  

## 2012-07-14 NOTE — Telephone Encounter (Signed)
zolpidem (AMBIEN) 10 MG tablet  #90

## 2012-08-01 ENCOUNTER — Ambulatory Visit: Payer: Medicare Other | Admitting: Cardiovascular Disease

## 2012-08-07 ENCOUNTER — Other Ambulatory Visit: Payer: Self-pay

## 2012-08-08 ENCOUNTER — Ambulatory Visit (INDEPENDENT_AMBULATORY_CARE_PROVIDER_SITE_OTHER): Payer: Medicare Other | Admitting: Cardiovascular Disease

## 2012-08-08 ENCOUNTER — Encounter: Payer: Self-pay | Admitting: Cardiovascular Disease

## 2012-08-08 ENCOUNTER — Telehealth: Payer: Self-pay | Admitting: Internal Medicine

## 2012-08-08 VITALS — BP 138/82 | HR 56 | Ht 72.0 in | Wt 184.0 lb

## 2012-08-08 DIAGNOSIS — IMO0001 Reserved for inherently not codable concepts without codable children: Secondary | ICD-10-CM

## 2012-08-08 DIAGNOSIS — E785 Hyperlipidemia, unspecified: Secondary | ICD-10-CM

## 2012-08-08 DIAGNOSIS — I10 Essential (primary) hypertension: Secondary | ICD-10-CM

## 2012-08-08 DIAGNOSIS — R011 Cardiac murmur, unspecified: Secondary | ICD-10-CM

## 2012-08-08 DIAGNOSIS — M791 Myalgia, unspecified site: Secondary | ICD-10-CM

## 2012-08-08 NOTE — Progress Notes (Signed)
Patient ID: Timothy Turner, male    DOB: [birth year], 72 y.o.   MRN: [id number]  HPI Comments: 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   He continues to take clonazepam with significant relief of his muscle pain. He did have a fall over one month ago and since then has had worsening arm and upper chest pain. He fell down several steps. He wonders if he needs a higher dose of clonazepam for symptoms. He has not tried NSAIDs. At baseline, He is more active, is able to walk more. He does continue to have calf muscle pain when he goes up hills. Overall he is very happy about the improvement of his symptoms. He walks approximately 45 minutes per day.   He reports that he has taken himself off losartan as we had previously discussed on prior clinic visit. Blood pressure has been relatively stable  He denies any significant chest pain, lightheadedness, near syncope or syncope.  EKG shows normal sinus rhythm with rate of 56 beats per minute, no significant ST or T wave changes, first degree AV block     Outpatient Encounter Prescriptions as of 08/08/2012  Medication Sig Dispense Refill  . aspirin 81 MG tablet Take 81 mg by mouth daily.        . calcipotriene (DOVONEX) 0.005 % cream Apply topically.        . Cholecalciferol (VITAMIN D HIGH POTENCY) 1000 UNITS capsule Take 1,000 Units by mouth daily. Take one tablet by mouth two times a day.       . clobetasol (TEMOVATE) 0.05 % cream Apply topically 2 (two) times daily.        . clonazePAM (KLONOPIN) 1 MG tablet TAKE ONE TABLET BY MOUTH TWICE DAILY AND TWO AT BEDTIME  120 tablet  1  . colesevelam (WELCHOL) 625 MG tablet Take 3 tablets twice daily.  540 tablet  1  .  esomeprazole (NEXIUM) 40 MG capsule Take 40 mg by mouth daily before breakfast.        . finasteride (PROSCAR) 5 MG tablet Take 5 mg by mouth daily.        . Glucosamine-Chondroitin (OSTEO BI-FLEX REGULAR STRENGTH) 250-200 MG TABS Take by mouth. Take one tablet daily.       [name] levothyroxine (SYNTHROID, LEVOTHROID) 75 MCG tablet TAKE 1 TABLET DAILY  90 tablet  3  . loratadine (CLARITIN) 10 MG tablet Take 10 mg by mouth daily.        . Multiple Vitamins-Minerals (CENTRUM SILVER PO) Take by mouth.        . naproxen sodium (ANAPROX) 220 MG tablet Take 220 mg by mouth 2 (two) times daily with a meal.      . NIASPAN 1000 MG CR tablet TAKE 2 TABLETS ONCE DAILY  180 tablet  3  . Omega-3 Fatty Acids (FISH OIL) 1000 MG CAPS Take 1,000 mg by mouth. Take two tablets daily.       . Testosterone (ANDROGEL PUMP) 1.25 GM/ACT (1%) GEL Place onto the skin daily.      [name] zolpidem (AMBIEN) 10 MG tablet Take 1 tablet (10 mg total) by mouth at bedtime.  90 tablet  2  . [DISCONTINUED] losartan (COZAAR) 100 MG tablet TAKE 1 TABLET DAILY  90 tablet  3    Review of Systems  Constitutional: Negative.   HENT: Negative.   Eyes: Negative.   Respiratory: Negative.   Gastrointestinal: Negative.   Musculoskeletal: Positive for myalgias.  Skin: Negative.   Neurological: Negative.   Psychiatric/Behavioral: Negative.   All other systems reviewed and are negative.    BP 138/82  Pulse 56  Ht 6' (1.829 m)  Wt 184 lb (83.462 kg)  BMI 24.95 kg/m2  Physical Exam  Nursing note and vitals reviewed. Constitutional: He is oriented to person, place, and time. He appears well-developed and well-nourished.  HENT:  Head: Normocephalic.  Nose: Nose normal.  Mouth/Throat: Oropharynx is clear and moist.  Eyes: Conjunctivae are normal. Pupils are equal, round, and reactive to light.  Neck: Normal range of motion. Neck supple. No JVD present.  Cardiovascular: Normal rate, regular rhythm, S1 normal, S2 normal and intact distal  pulses.  Exam reveals no gallop and no friction rub.   Murmur heard.  Systolic murmur is present with a grade of 2/6  Pulmonary/Chest: Effort normal and breath sounds normal. No respiratory distress. He has no wheezes. He has no rales. He exhibits no tenderness.  Abdominal: Soft. Bowel sounds are normal. He exhibits no distension. There is no tenderness.  Musculoskeletal: Normal range of motion. He exhibits no edema and no tenderness.  Lymphadenopathy:    He has no cervical adenopathy.  Neurological: He is alert and oriented to person, place, and time. Coordination normal.  Skin: Skin is warm and dry. No rash noted. No erythema.  Psychiatric: He has a normal mood and affect. His behavior is normal. Judgment and thought content normal.      Assessment and Plan

## 2012-08-08 NOTE — Telephone Encounter (Signed)
Patient Information:  Caller Name: Helmer  Phone: [phone]  Patient: Timothy Turner, Timothy Turner  Gender: Male  DOB: [birth year]  Age: 72 Years  PCP: Ronna Polio (Adults only)  Office Follow Up:  Does the office need to follow up with this patient?: Yes  Instructions For The Office: OFFICE, PT WANTS TO KNOW IF MEDICATION CAN BE INCREASED.  PT REPORTS IT HAS BEEN HELPING HIM FOR YEARS, BUT OVER THE PAST 6 WEEKS HIS MUSCLE PAIN HAS BEEN GETTING WORSE.  NO NEW SYMPTOMS, JUST INCREASING MUSCLE PAIN  RN Note:  pt was last seen in the office on 12/13 and was seen by the cardiologist today.  Symptoms  Reason For Call & Symptoms: pt reports he is having muscle pain in his arms and back has been getting worse over the past 6 weeks  Reviewed Health History In EMR: Yes  Reviewed Medications In EMR: Yes  Reviewed Allergies In EMR: Yes  Reviewed Surgeries / Procedures: Yes  Date of Onset of Symptoms: Unknown  Treatments Tried: clonazapam 1mg  (BID and 2 at night)  Treatments Tried Worked: No  Guideline(s) Used:  No Protocol Available - Sick Adult  Disposition Per Guideline:   Discuss with PCP and Callback by Nurse Today  Reason For Disposition Reached:   Nursing judgment  Advice Given:  N/A

## 2012-08-08 NOTE — Assessment & Plan Note (Signed)
Murmur on exam, likely from mitral valve regurgitation. Still low-grade. No further testing at this time.

## 2012-08-08 NOTE — Assessment & Plan Note (Signed)
He has weaned himself off his blood pressure medication. A pressure is relatively stable. We have asked him to continue periodic monitoring.

## 2012-08-08 NOTE — Assessment & Plan Note (Signed)
Recent fall over one month ago with worsening of his muscle discomfort. If symptoms persist, he will talk with Dr. Dan Humphreys. He has had significant improvement on Klonopin.

## 2012-08-08 NOTE — Patient Instructions (Addendum)
You are doing well. No medication changes were made.  Please call us if you have new issues that need to be addressed before your next appt.  Your physician wants you to follow-up in: 12 months.  You will receive a reminder letter in the mail two months in advance. If you don't receive a letter, please call our office to schedule the follow-up appointment. 

## 2012-08-08 NOTE — Assessment & Plan Note (Signed)
Cholesterol well controlled on WelChol.

## 2012-08-09 NOTE — Telephone Encounter (Signed)
Caller: Lucious/Patient; Phone: [phone]; Reason for Call: Returning call to [facility] at office; EMR indicates she left message to call back.

## 2012-08-09 NOTE — Telephone Encounter (Signed)
There are significant risks to increasing his Clonazepam dose including sedation. We should see him in a visit prior to changing dose.

## 2012-08-09 NOTE — Telephone Encounter (Signed)
LMTCB

## 2012-08-09 NOTE — Telephone Encounter (Signed)
Would like to increase his Clonazepam because his muscles began to ache again, admit that it does help a little but not enough for him not to notice so he would like for you to increase this if possible.

## 2012-08-10 NOTE — Telephone Encounter (Signed)
Appointment scheduled and patient confirmed.

## 2012-08-10 NOTE — Telephone Encounter (Signed)
You can add at an 11:45 any day, or book in a morning visit in a few weeks when there is availability.

## 2012-08-10 NOTE — Telephone Encounter (Signed)
Spoke with patient and he would like to come in ASAP and would like something early morning. I do not see anything soon, any suggestions where I may fit him in?

## 2012-08-23 ENCOUNTER — Ambulatory Visit (INDEPENDENT_AMBULATORY_CARE_PROVIDER_SITE_OTHER): Payer: Medicare Other | Admitting: Internal Medicine

## 2012-08-23 ENCOUNTER — Encounter: Payer: Self-pay | Admitting: Internal Medicine

## 2012-08-23 VITALS — BP 144/70 | HR 60 | Temp 97.9°F | Wt 183.0 lb

## 2012-08-23 DIAGNOSIS — M791 Myalgia, unspecified site: Secondary | ICD-10-CM

## 2012-08-23 DIAGNOSIS — K2270 Barrett's esophagus without dysplasia: Secondary | ICD-10-CM

## 2012-08-23 MED ORDER — CLONAZEPAM 1 MG PO TABS: 1.0000 mg | ORAL_TABLET | Freq: Three times a day (TID) | ORAL | Status: DC | PRN

## 2012-08-23 MED ORDER — CELECOXIB 200 MG PO CAPS: 200.0000 mg | ORAL_CAPSULE | Freq: Two times a day (BID) | ORAL | Status: DC

## 2012-08-23 NOTE — Assessment & Plan Note (Addendum)
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 Previously treated with gabapentin, lyrica, cymbalta, NSAIDS, tramadol with no improvement. Had significant improvement with use of Clonazepam. Now having some increased symptoms of thigh and arm pain. Will try increasing dose of Clonazepam to maximum of 2mg  tid on days with severe pain and 1mg  bid and 2mg  at bedtime on days with better pain control. Discussed significant risks of benzodiazepines including sedation and dependence, however given he has tried a host of other medications to control pain without improvement, this is currently best option. Will change Aleve to Celebrex, and discussed risks of these medications given h/o Barrett's esophagus. If no improvement with this regimen, discussed referral to [facility].  Follow up 4 weeks and prn.

## 2012-08-23 NOTE — Progress Notes (Signed)
Subjective:    Patient ID: Timothy Turner, male    DOB: 26-Dec-1940, 72 y.o.   MRN: [id number]  HPI 72YO male with h/o Chronic Pain Syndrome and Barrett's esophagus presents for follow up. Notes recent increase in pain symptoms, with severe aching muscle pain in arms and legs, that was made worse about 2 months ago, after he fell when walking down stairs. Has been taking Aleve up to 6 pills daily and scheduled Clonazepam 1mg  po bid and 2mg  po qhs with no improvement. Unable to sleep longer than 4-5 hours because of waking with pain. Denies weakness. Denies other symptoms such as fever, chills, change in weight or appetite.  Has some fatigue.  Outpatient Encounter Prescriptions as of 08/23/2012  Medication Sig Dispense Refill  . aspirin 81 MG tablet Take 81 mg by mouth daily.        . calcipotriene (DOVONEX) 0.005 % cream Apply topically.        . cetirizine (ZYRTEC) 10 MG tablet Take 10 mg by mouth daily.      . Cholecalciferol (VITAMIN D HIGH POTENCY) 1000 UNITS capsule Take 1,000 Units by mouth daily. Take one tablet by mouth two times a day.       . clobetasol (TEMOVATE) 0.05 % cream Apply topically 2 (two) times daily.        . colesevelam (WELCHOL) 625 MG tablet Take 3 tablets twice daily.  540 tablet  1  . esomeprazole (NEXIUM) 40 MG capsule Take 40 mg by mouth daily before breakfast.        . finasteride (PROSCAR) 5 MG tablet Take 5 mg by mouth daily.        . Glucosamine-Chondroitin (OSTEO BI-FLEX REGULAR STRENGTH) 250-200 MG TABS Take by mouth. Take one tablet daily.       [name] levothyroxine (SYNTHROID, LEVOTHROID) 75 MCG tablet TAKE 1 TABLET DAILY  90 tablet  3  . Multiple Vitamins-Minerals (CENTRUM SILVER PO) Take by mouth.        [name] NIASPAN 1000 MG CR tablet TAKE 2 TABLETS ONCE DAILY  180 tablet  3  . Omega-3 Fatty Acids (FISH OIL) 1000 MG CAPS Take 1,000 mg by mouth. Take two tablets daily.       . Testosterone (ANDROGEL PUMP) 1.25 GM/ACT (1%) GEL Place onto the skin daily.      [name]  zolpidem (AMBIEN) 10 MG tablet Take 1 tablet (10 mg total) by mouth at bedtime.  90 tablet  2  . [DISCONTINUED] clonazePAM (KLONOPIN) 1 MG tablet TAKE ONE TABLET BY MOUTH TWICE DAILY AND TWO AT BEDTIME  120 tablet  1  . [DISCONTINUED] naproxen sodium (ANAPROX) 220 MG tablet Take 220 mg by mouth 2 (two) times daily with a meal.      . celecoxib (CELEBREX) 200 MG capsule Take 1 capsule (200 mg total) by mouth 2 (two) times daily.  180 capsule  3  . clonazePAM (KLONOPIN) 1 MG tablet Take 1-2 tablets (1-2 mg total) by mouth 3 (three) times daily as needed (severe muscle spasm).  540 tablet  1  . loratadine (CLARITIN) 10 MG tablet Take 10 mg by mouth daily.         No facility-administered encounter medications on file as of 08/23/2012.   BP 144/70  Pulse 60  Temp(Src) 97.9 F (36.6 C) (Oral)  Wt 183 lb (83.008 kg)  BMI 24.81 kg/m2  SpO2 97%  Review of Systems  Constitutional: Negative for fever, chills, activity change, appetite change, fatigue and unexpected weight  change.  Eyes: Negative for visual disturbance.  Respiratory: Negative for cough and shortness of breath.   Cardiovascular: Negative for chest pain, palpitations and leg swelling.  Gastrointestinal: Negative for abdominal pain and abdominal distention.  Genitourinary: Negative for dysuria, urgency and difficulty urinating.  Musculoskeletal: Positive for myalgias and arthralgias. Negative for gait problem.  Skin: Negative for color change and rash.  Hematological: Negative for adenopathy.  Psychiatric/Behavioral: Negative for sleep disturbance and dysphoric mood. The patient is not nervous/anxious.        Objective:   Physical Exam  Constitutional: He is oriented to person, place, and time. He appears well-developed and well-nourished. No distress.  HENT:  Head: Normocephalic and atraumatic.  Right Ear: External ear normal.  Left Ear: External ear normal.  Nose: Nose normal.  Mouth/Throat: Oropharynx is clear and moist. No  oropharyngeal exudate.  Eyes: Conjunctivae and EOM are normal. Pupils are equal, round, and reactive to light. Right eye exhibits no discharge. Left eye exhibits no discharge. No scleral icterus.  Neck: Normal range of motion. Neck supple. No tracheal deviation present. No thyromegaly present.  Cardiovascular: Normal rate, regular rhythm and normal heart sounds.  Exam reveals no gallop and no friction rub.   No murmur heard. Pulmonary/Chest: Effort normal and breath sounds normal. No respiratory distress. He has no wheezes. He has no rales. He exhibits no tenderness.  Musculoskeletal: Normal range of motion. He exhibits no edema.       Right upper arm: He exhibits tenderness.       Left upper arm: He exhibits tenderness.       Right forearm: He exhibits tenderness.       Left forearm: He exhibits tenderness.       Right upper leg: He exhibits tenderness.       Left upper leg: He exhibits tenderness.  Lymphadenopathy:    He has no cervical adenopathy.  Neurological: He is alert and oriented to person, place, and time. No cranial nerve deficit. Coordination normal.  Skin: Skin is warm and dry. No rash noted. He is not diaphoretic. No erythema. No pallor.  Psychiatric: He has a normal mood and affect. His behavior is normal. Judgment and thought content normal.          Assessment & Plan:

## 2012-08-23 NOTE — Patient Instructions (Signed)
Stop Aleve. Start Celebrex 200mg  twice daily.  Increase Clonazepam 1-2 mg up to three times daily as needed for severe pain. Limit use as much as possible.  Follow up 4 weeks.

## 2012-08-23 NOTE — Assessment & Plan Note (Signed)
Discussed significant risk of using NSAIDS with Barrett's esophagus. However, pain symptoms severe and keeping him from performing ADLs and sleeping. Will try changing to Celebrex. We discussed that he may need to ultimately change to long acting narcotic for better pain control.

## 2012-09-09 ENCOUNTER — Other Ambulatory Visit: Payer: Self-pay | Admitting: Cardiovascular Disease

## 2012-09-09 ENCOUNTER — Telehealth: Payer: Self-pay | Admitting: *Deleted

## 2012-09-09 NOTE — Telephone Encounter (Signed)
Patient called stating his insurance carrier could not fill his script for celebrex because of the way it was written. The number is [phone] option 2 and it should take you directly to the pharmacist

## 2012-09-13 NOTE — Telephone Encounter (Signed)
Eber Jones,  Could you please call for this PA on this patient?

## 2012-09-13 NOTE — Telephone Encounter (Signed)
Pt still needing celebrex.  States the pharmacy needs Dr. Dan Humphreys to call them to tell them exactly what she wants.   [phone], Option #2 and pt states that should take you directly to the pharmacist.  Please give pt a call back when this is complete.  Pt states he has been leaving messages since last Thursday.

## 2012-09-14 NOTE — Telephone Encounter (Signed)
Called this number and verbally gave the prescription to the pharmacy, they stated it had been cancelled so she entered it again.

## 2012-09-15 NOTE — Telephone Encounter (Signed)
Patient wife Elease Hashimoto called back, she stated she had called [facility] and they told her they did not have the script and no one had called it in to them. I called [facility], spoke to Evonnie Pat the pharmacist, they did have the prescription but it was hold because they had it listed as an allergy. Informed them patient has been taking Celebrex for years and it would be ok for them to release it to them. Per Thayer Ohm they will work on releasing it today. Call patient wife Elease Hashimoto and informed her of this.

## 2012-09-15 NOTE — Telephone Encounter (Signed)
Spoke with patient wife Timothy Turner informed her the prescription was called in on yesterday. According to her all they needed to fill the prescription was clarification.

## 2012-10-06 ENCOUNTER — Ambulatory Visit (INDEPENDENT_AMBULATORY_CARE_PROVIDER_SITE_OTHER): Payer: Medicare Other | Admitting: Internal Medicine

## 2012-10-06 ENCOUNTER — Encounter: Payer: Self-pay | Admitting: Internal Medicine

## 2012-10-06 VITALS — BP 148/80 | HR 54 | Temp 97.7°F | Wt 184.0 lb

## 2012-10-06 DIAGNOSIS — G894 Chronic pain syndrome: Secondary | ICD-10-CM

## 2012-10-06 DIAGNOSIS — K2270 Barrett's esophagus without dysplasia: Secondary | ICD-10-CM

## 2012-10-06 NOTE — Assessment & Plan Note (Signed)
BP Readings from Last 3 Encounters:  10/06/12 148/80  08/23/12 144/70  08/08/12 138/82   BP well generally controlled on current medications. Will continue.

## 2012-10-06 NOTE — Assessment & Plan Note (Signed)
Pain symptoms are improved with increase in Clonazepam. Will continue to monitor.

## 2012-10-06 NOTE — Assessment & Plan Note (Signed)
Pt is due for repeat upper endoscopy. He will follow up as scheduled with GI.

## 2012-10-06 NOTE — Progress Notes (Signed)
Subjective:    Patient ID: Timothy Turner, male    DOB: 04-Jun-1941, 72 y.o.   MRN: [id number]  HPI 72YO male with h/o chronic muscular pain, Barrett's esophagus presents for follow up. Doing well. Notes that muscular pain improved with increased dose of Clonazepam and use of Celebrex. No new concerns today. Notes he is due for repeat endoscopy this year and is planning to follow up with [facility] for this. Continues on Nexium.  Outpatient Encounter Prescriptions as of 10/06/2012  Medication Sig Dispense Refill  . aspirin 81 MG tablet Take 81 mg by mouth daily.        . calcipotriene (DOVONEX) 0.005 % cream Apply topically.        . celecoxib (CELEBREX) 200 MG capsule Take 1 capsule (200 mg total) by mouth 2 (two) times daily.  180 capsule  3  . cetirizine (ZYRTEC) 10 MG tablet Take 10 mg by mouth daily.      . Cholecalciferol (VITAMIN D HIGH POTENCY) 1000 UNITS capsule Take 1,000 Units by mouth daily. Take one tablet by mouth two times a day.       . clobetasol (TEMOVATE) 0.05 % cream Apply topically 2 (two) times daily.        . clonazePAM (KLONOPIN) 1 MG tablet Take 1-2 tablets (1-2 mg total) by mouth 3 (three) times daily as needed (severe muscle spasm).  540 tablet  1  . esomeprazole (NEXIUM) 40 MG capsule Take 40 mg by mouth daily before breakfast.        . finasteride (PROSCAR) 5 MG tablet Take 5 mg by mouth daily.        . Glucosamine-Chondroitin (OSTEO BI-FLEX REGULAR STRENGTH) 250-200 MG TABS Take by mouth. Take one tablet daily.       [name] levothyroxine (SYNTHROID, LEVOTHROID) 75 MCG tablet TAKE 1 TABLET DAILY  90 tablet  3  . Multiple Vitamins-Minerals (CENTRUM SILVER PO) Take by mouth.        [name] NIASPAN 1000 MG CR tablet TAKE 2 TABLETS ONCE DAILY  180 tablet  3  . Omega-3 Fatty Acids (FISH OIL) 1000 MG CAPS Take 1,000 mg by mouth. Take two tablets daily.       . Testosterone (ANDROGEL PUMP) 1.25 GM/ACT (1%) GEL Place onto the skin daily.      Lilian Kapur 625 MG tablet TAKE 3  TABLETS TWICE DAILY  540 tablet  3  . zolpidem (AMBIEN) 10 MG tablet Take 1 tablet (10 mg total) by mouth at bedtime.  90 tablet  2  . loratadine (CLARITIN) 10 MG tablet Take 10 mg by mouth daily.         No facility-administered encounter medications on file as of 10/06/2012.   BP 148/80  Pulse 54  Temp(Src) 97.7 F (36.5 C) (Oral)  Wt 184 lb (83.462 kg)  BMI 24.95 kg/m2  SpO2 96%   Review of Systems  Constitutional: Negative for fever, chills, activity change, appetite change, fatigue and unexpected weight change.  Eyes: Negative for visual disturbance.  Respiratory: Negative for cough and shortness of breath.   Cardiovascular: Negative for chest pain, palpitations and leg swelling.  Gastrointestinal: Negative for abdominal pain and abdominal distention.  Genitourinary: Negative for dysuria, urgency and difficulty urinating.  Musculoskeletal: Negative for arthralgias and gait problem.  Skin: Negative for color change and rash.  Hematological: Negative for adenopathy.  Psychiatric/Behavioral: Negative for sleep disturbance and dysphoric mood. The patient is not nervous/anxious.        Objective:  Physical Exam  Constitutional: He is oriented to person, place, and time. He appears well-developed and well-nourished. No distress.  HENT:  Head: Normocephalic and atraumatic.  Right Ear: External ear normal.  Left Ear: External ear normal.  Nose: Nose normal.  Mouth/Throat: Oropharynx is clear and moist. No oropharyngeal exudate.  Eyes: Conjunctivae and EOM are normal. Pupils are equal, round, and reactive to light. Right eye exhibits no discharge. Left eye exhibits no discharge. No scleral icterus.  Neck: Normal range of motion. Neck supple. No tracheal deviation present. No thyromegaly present.  Cardiovascular: Normal rate, regular rhythm and normal heart sounds.  Exam reveals no gallop and no friction rub.   No murmur heard. Pulmonary/Chest: Effort normal and breath sounds  normal. No accessory muscle usage. Not tachypneic. No respiratory distress. He has no decreased breath sounds. He has no wheezes. He has no rhonchi. He has no rales. He exhibits no tenderness.  Musculoskeletal: Normal range of motion. He exhibits no edema.  Lymphadenopathy:    He has no cervical adenopathy.  Neurological: He is alert and oriented to person, place, and time. No cranial nerve deficit. Coordination normal.  Skin: Skin is warm and dry. No rash noted. He is not diaphoretic. No erythema. No pallor.  Psychiatric: He has a normal mood and affect. His behavior is normal. Judgment and thought content normal.          Assessment & Plan:

## 2012-11-18 ENCOUNTER — Other Ambulatory Visit: Payer: Self-pay | Admitting: Cardiovascular Disease

## 2012-11-21 ENCOUNTER — Other Ambulatory Visit: Payer: Self-pay | Admitting: *Deleted

## 2012-11-21 MED ORDER — NIACIN ER (ANTIHYPERLIPIDEMIC) 1000 MG PO TBCR: EXTENDED_RELEASE_TABLET | ORAL | Status: DC

## 2012-11-21 NOTE — Telephone Encounter (Signed)
Refilled Niaspan sent to [facility].

## 2012-12-14 ENCOUNTER — Encounter: Payer: Medicare Other | Admitting: Internal Medicine

## 2012-12-16 ENCOUNTER — Telehealth: Payer: Self-pay | Admitting: *Deleted

## 2012-12-16 NOTE — Telephone Encounter (Signed)
Called [phone] received PA request form, placed in Dr. Sonny Dandy folder

## 2012-12-16 NOTE — Telephone Encounter (Signed)
Patient left a voicemail stating he needs PA on his Zolpidem. Please call [facility] at [phone]

## 2012-12-21 ENCOUNTER — Telehealth: Payer: Self-pay | Admitting: Internal Medicine

## 2012-12-21 NOTE — Telephone Encounter (Signed)
Form is on the ledge for completion

## 2012-12-21 NOTE — Telephone Encounter (Signed)
Pt states he has left msg regarding need for refill of zolpidem.  Pt states Dr. has to call this medication in to [phone] and tell them it is for a 90 day supply = 90 pills with 4 refills so that it will last him a year.

## 2012-12-29 NOTE — Telephone Encounter (Signed)
Form was completed and faxed back on 12/22/12. Waiting on insurance company response

## 2012-12-30 ENCOUNTER — Other Ambulatory Visit: Payer: Self-pay | Admitting: Internal Medicine

## 2012-12-30 MED ORDER — ZOLPIDEM TARTRATE 10 MG PO TABS: 10.0000 mg | ORAL_TABLET | Freq: Every day | ORAL | Status: DC

## 2013-01-03 ENCOUNTER — Telehealth: Payer: Self-pay | Admitting: Internal Medicine

## 2013-01-03 MED ORDER — ZOLPIDEM TARTRATE 10 MG PO TABS: 10.0000 mg | ORAL_TABLET | Freq: Every day | ORAL | Status: DC

## 2013-01-03 NOTE — Telephone Encounter (Signed)
Pt states his pharmacy told him they have not received refill for his zolpidem.  Pt states it should be for 90 day supply b/c that is the only way his ins. will pay for it.  Pt only has a few left and is asking for a call when this has been taken care of.

## 2013-01-03 NOTE — Addendum Note (Signed)
Addended by: Theola Sequin on: 01/03/2013 12:43 PM   Modules accepted: Orders

## 2013-01-04 NOTE — Telephone Encounter (Signed)
Rx faxed to pharmacy yesterday .

## 2013-01-05 ENCOUNTER — Telehealth: Payer: Self-pay | Admitting: *Deleted

## 2013-01-05 NOTE — Telephone Encounter (Signed)
Received a fax from [facility], Zolpidem Tartrate tablet have been APPROVED   06.25.2014-07.16.2015

## 2013-02-06 ENCOUNTER — Encounter: Payer: Medicare Other | Admitting: Internal Medicine

## 2013-02-15 ENCOUNTER — Encounter: Payer: Self-pay | Admitting: *Deleted

## 2013-02-16 ENCOUNTER — Telehealth: Payer: Self-pay | Admitting: *Deleted

## 2013-02-16 ENCOUNTER — Encounter: Payer: Self-pay | Admitting: Internal Medicine

## 2013-02-16 ENCOUNTER — Ambulatory Visit (INDEPENDENT_AMBULATORY_CARE_PROVIDER_SITE_OTHER): Payer: Medicare Other | Admitting: Internal Medicine

## 2013-02-16 VITALS — BP 140/78 | HR 53 | Temp 97.9°F | Ht 72.0 in | Wt 181.0 lb

## 2013-02-16 DIAGNOSIS — Z0000 Encounter for general adult medical examination without abnormal findings: Secondary | ICD-10-CM

## 2013-02-16 DIAGNOSIS — E785 Hyperlipidemia, unspecified: Secondary | ICD-10-CM

## 2013-02-16 DIAGNOSIS — D509 Iron deficiency anemia, unspecified: Secondary | ICD-10-CM

## 2013-02-16 DIAGNOSIS — E039 Hypothyroidism, unspecified: Secondary | ICD-10-CM

## 2013-02-16 LAB — CBC WITH DIFFERENTIAL/PLATELET
Basophils Absolute: 0 10*3/uL (ref 0.0–0.1)
Eosinophils Absolute: 0.2 10*3/uL (ref 0.0–0.7)
Lymphocytes Relative: 26.4 % (ref 12.0–46.0)
MCHC: 33.6 g/dL (ref 30.0–36.0)
Neutrophils Relative %: 60.7 % (ref 43.0–77.0)
RDW: 14 % (ref 11.5–14.6)

## 2013-02-16 NOTE — Assessment & Plan Note (Signed)
General medical exam normal today. Health maintenance is UTD. Encouraged continued healthy diet and regular exercise with walking. Will check CBC, CMP, lipids with labs today. PSA is followed by his urologist. Follow up here in 6 months and prn.   

## 2013-02-16 NOTE — Assessment & Plan Note (Signed)
Will check CBC with labs today. 

## 2013-02-16 NOTE — Assessment & Plan Note (Signed)
Will check lipids and LFTs with labs today. Discontinued niacin.

## 2013-02-16 NOTE — Progress Notes (Signed)
Subjective:    Patient ID: Timothy Turner, male    DOB: Jul 13, 1940, 72 y.o.   MRN: [id number]  HPI The patient is here for annual Medicare wellness examination and management of other chronic and acute problems.   The risk factors are reflected in the social history.  The roster of all physicians providing medical care to patient - is listed in the Snapshot section of the chart.  Activities of daily living:  The patient is 100% independent in all ADLs: dressing, toileting, feeding as well as independent mobility  Home safety : The patient has smoke detectors in the home. They wear seatbelts.  There are firearms at home, which are locked. There is no violence in the home.   There is no risks for hepatitis, STDs or HIV. There is a history of blood transfusion in 2005 after colonoscopy. They have no travel history to infectious disease endemic areas of the world.  The patient has not seen their dentist in the last six month (has dentures).  They have seen their eye doctor in the last year (Dr. Oren Bracket).  They deny issues with hearing.  They have deferred audiologic testing in the last year.    They do not  have excessive sun exposure. Discussed the need for sun protection: hats, long sleeves and use of sunscreen if there is significant sun exposure.   Diet: the importance of a healthy diet is discussed. They do have a healthy diet.  The benefits of regular aerobic exercise were discussed. He exercises by walking 60 min per day outside.   Depression screen: there are no signs or vegative symptoms of depression- irritability, change in appetite, anhedonia, sadness/tearfullness.  Cognitive assessment: the patient manages all their financial and personal affairs and is actively engaged. They could relate day,date,year and events.  The following portions of the patient's history were reviewed and updated as appropriate: allergies, current medications, past family history, past medical  history,  past surgical history, past social history  and problem list.  Visual acuity was not assessed per patient preference since he has regular follow up with ophthalmologist. Hearing and body mass index were assessed and reviewed.   During the course of the visit the patient was educated and counseled about appropriate screening and preventive services including : fall prevention , diabetes screening, nutrition counseling, colorectal cancer screening, and recommended immunizations.    In regards to hypertension, BP has been well controlled at home near 120/60. No chest pain, dyspnea, headaches.  In regards to chronic muscle pain, he reports that recently symptoms are improved. He continues to use Clonopin as needed. He has been very active, walking 60 minutes daily. He reports some occasional cramping in his calves but this is tolerable.  Outpatient Encounter Prescriptions as of 02/16/2013  Medication Sig Dispense Refill  . aspirin 81 MG tablet Take 81 mg by mouth daily.        . calcipotriene (DOVONEX) 0.005 % cream Apply topically.        . cetirizine (ZYRTEC) 10 MG tablet Take 10 mg by mouth daily.      . Cholecalciferol (VITAMIN D HIGH POTENCY) 1000 UNITS capsule Take 1,000 Units by mouth daily. Take one tablet by mouth two times a day.       . clobetasol (TEMOVATE) 0.05 % cream Apply topically 2 (two) times daily.        . clonazePAM (KLONOPIN) 1 MG tablet Take 1-2 tablets (1-2 mg total) by mouth 3 (three) times daily as needed (  severe muscle spasm).  540 tablet  1  . esomeprazole (NEXIUM) 40 MG capsule Take 40 mg by mouth daily before breakfast.        . finasteride (PROSCAR) 5 MG tablet Take 5 mg by mouth daily.        . Glucosamine-Chondroitin (OSTEO BI-FLEX REGULAR STRENGTH) 250-200 MG TABS Take by mouth. Take one tablet daily.       [name] levothyroxine (SYNTHROID, LEVOTHROID) 75 MCG tablet TAKE 1 TABLET DAILY  90 tablet  3  . Lipoic Acid 150 MG CAPS Take 150 mg by mouth 2 (two) times  daily.      [name] losartan (COZAAR) 100 MG tablet Take 100 mg by mouth daily.      . metoprolol tartrate (LOPRESSOR) 25 MG tablet Take 25 mg by mouth 2 (two) times daily as needed. As needed for palpitations      . Multiple Vitamins-Minerals (CENTRUM SILVER PO) Take by mouth.        . Omega-3 Fatty Acids (FISH OIL) 1000 MG CAPS Take 1,000 mg by mouth. Take two tablets daily.       . Testosterone (ANDROGEL PUMP) 1.25 GM/ACT (1%) GEL Place onto the skin daily.      Lilian Kapur 625 MG tablet TAKE 3 TABLETS TWICE DAILY  540 tablet  3  . zolpidem (AMBIEN) 10 MG tablet Take 1 tablet (10 mg total) by mouth at bedtime.  90 tablet  0  . [DISCONTINUED] niacin (NIASPAN) 1000 MG CR tablet TAKE 2 TABLETS ONCE DAILY  180 tablet  3  . celecoxib (CELEBREX) 200 MG capsule Take 1 capsule (200 mg total) by mouth 2 (two) times daily.  180 capsule  3  . loratadine (CLARITIN) 10 MG tablet Take 10 mg by mouth daily.         No facility-administered encounter medications on file as of 02/16/2013.   BP 140/78  Pulse 53  Temp(Src) 97.9 F (36.6 C) (Oral)  Ht 6' (1.829 m)  Wt 181 lb (82.101 kg)  BMI 24.54 kg/m2  SpO2 96%   Review of Systems  Constitutional: Negative for fever, chills, activity change, appetite change, fatigue and unexpected weight change.  Eyes: Negative for visual disturbance.  Respiratory: Negative for cough and shortness of breath.   Cardiovascular: Negative for chest pain, palpitations and leg swelling.  Gastrointestinal: Negative for abdominal pain and abdominal distention.  Genitourinary: Negative for dysuria, urgency and difficulty urinating.  Musculoskeletal: Negative for arthralgias and gait problem.  Skin: Negative for color change and rash.  Hematological: Negative for adenopathy.  Psychiatric/Behavioral: Negative for sleep disturbance and dysphoric mood. The patient is not nervous/anxious.        Objective:   Physical Exam  Constitutional: He is oriented to person, place, and time.  He appears well-developed and well-nourished. No distress.  HENT:  Head: Normocephalic and atraumatic.  Right Ear: External ear normal.  Left Ear: External ear normal.  Nose: Nose normal.  Mouth/Throat: Oropharynx is clear and moist. No oropharyngeal exudate.  Eyes: Conjunctivae and EOM are normal. Pupils are equal, round, and reactive to light. Right eye exhibits no discharge. Left eye exhibits no discharge. No scleral icterus.  Neck: Normal range of motion. Neck supple. No tracheal deviation present. No thyromegaly present.  Cardiovascular: Normal rate and regular rhythm.  Exam reveals no gallop and no friction rub.   Murmur (systolic) heard. Pulmonary/Chest: Effort normal and breath sounds normal. No respiratory distress. He has no wheezes. He has no rales. He exhibits no  tenderness.  Abdominal: Soft. Bowel sounds are normal. He exhibits no distension and no mass. There is no tenderness. There is no rebound and no guarding.  Musculoskeletal: Normal range of motion. He exhibits no edema.  Lymphadenopathy:    He has no cervical adenopathy.  Neurological: He is alert and oriented to person, place, and time. No cranial nerve deficit. Coordination normal.  Skin: Skin is warm and dry. No rash noted. He is not diaphoretic. No erythema. No pallor.  Psychiatric: He has a normal mood and affect. His behavior is normal. Judgment and thought content normal.          Assessment & Plan:

## 2013-02-16 NOTE — Assessment & Plan Note (Signed)
Will check TSH with labs today. 

## 2013-02-16 NOTE — Telephone Encounter (Signed)
Called [facility] for PA on the Celebrex, received PA request form place in Dr.Walker folder

## 2013-02-17 LAB — LIPID PANEL
HDL: 36.5 mg/dL — ABNORMAL LOW (ref >39.00)
Triglycerides: 91 mg/dL (ref 0.0–149.0)

## 2013-02-17 LAB — COMPREHENSIVE METABOLIC PANEL
ALT: 18 U/L (ref 0–53)
BUN: 9 mg/dL (ref 6–23)
CO2: 28 mEq/L (ref 19–32)
Calcium: 9.2 mg/dL (ref 8.4–10.5)
Chloride: 104 mEq/L (ref 96–112)
Creatinine, Ser: 1.1 mg/dL (ref 0.4–1.5)
GFR: 74.26 mL/min (ref >60.00)

## 2013-02-22 ENCOUNTER — Encounter: Payer: Self-pay | Admitting: Internal Medicine

## 2013-02-22 ENCOUNTER — Other Ambulatory Visit: Payer: Self-pay | Admitting: Internal Medicine

## 2013-02-22 DIAGNOSIS — M791 Myalgia, unspecified site: Secondary | ICD-10-CM

## 2013-02-22 MED ORDER — CLONAZEPAM 1 MG PO TABS: 1.0000 mg | ORAL_TABLET | Freq: Three times a day (TID) | ORAL | Status: DC | PRN

## 2013-02-22 MED ORDER — ZOLPIDEM TARTRATE 10 MG PO TABS: 10.0000 mg | ORAL_TABLET | Freq: Every day | ORAL | Status: DC

## 2013-02-22 NOTE — Addendum Note (Signed)
Addended by: Theola Sequin on: 02/22/2013 05:01 PM   Modules accepted: Orders

## 2013-02-22 NOTE — Telephone Encounter (Signed)
Fine to refill 

## 2013-02-22 NOTE — Telephone Encounter (Signed)
clonazePAM (KLONOPIN) 1 MG tablet 

## 2013-02-22 NOTE — Telephone Encounter (Signed)
Ok to refill 

## 2013-02-22 NOTE — Telephone Encounter (Signed)
Patient called again, want a refill on Ambien as well

## 2013-02-22 NOTE — Telephone Encounter (Signed)
Fine to refill Ambien.

## 2013-02-22 NOTE — Telephone Encounter (Signed)
Rx printed to be signed then faxed

## 2013-03-06 ENCOUNTER — Ambulatory Visit: Payer: Self-pay | Admitting: Gastroenterology

## 2013-03-06 HISTORY — PX: UPPER GI ENDOSCOPY: SHX6162

## 2013-03-07 LAB — PATHOLOGY REPORT

## 2013-03-21 ENCOUNTER — Telehealth: Payer: Self-pay | Admitting: Internal Medicine

## 2013-03-21 NOTE — Telephone Encounter (Signed)
Patient called in after receiving message that he needed his flu and shingles immunization, I have scheduled him for both for 03/31/13 can he get the shingle vaccine.

## 2013-03-21 NOTE — Telephone Encounter (Signed)
Noted, fwd to Dr. Dan Humphreys

## 2013-03-21 NOTE — Telephone Encounter (Signed)
I would recommend that he get the Zostavax at a local pharmacy, as it will be less expensive. We can write the Rx for this.

## 2013-03-21 NOTE — Telephone Encounter (Signed)
Informed patient wife and she will let him know.

## 2013-03-23 ENCOUNTER — Encounter: Payer: Self-pay | Admitting: Internal Medicine

## 2013-03-30 ENCOUNTER — Encounter: Payer: Self-pay | Admitting: *Deleted

## 2013-03-31 ENCOUNTER — Ambulatory Visit (INDEPENDENT_AMBULATORY_CARE_PROVIDER_SITE_OTHER): Payer: Medicare Other | Admitting: *Deleted

## 2013-03-31 DIAGNOSIS — Z23 Encounter for immunization: Secondary | ICD-10-CM

## 2013-04-27 ENCOUNTER — Other Ambulatory Visit: Payer: Self-pay | Admitting: Internal Medicine

## 2013-04-27 ENCOUNTER — Telehealth: Payer: Self-pay | Admitting: Internal Medicine

## 2013-04-27 DIAGNOSIS — M791 Myalgia, unspecified site: Secondary | ICD-10-CM

## 2013-04-27 NOTE — Telephone Encounter (Signed)
Pt needs about #6 days worth of clonazepam called to [facility].  States his refill is available on 11/9 but will be shipped from [place] so he needs a few days until it comes in.

## 2013-04-28 ENCOUNTER — Other Ambulatory Visit: Payer: Self-pay | Admitting: Internal Medicine

## 2013-04-28 MED ORDER — CLONAZEPAM 1 MG PO TABS: 1.0000 mg | ORAL_TABLET | Freq: Three times a day (TID) | ORAL | Status: DC | PRN

## 2013-04-28 NOTE — Telephone Encounter (Signed)
Prescription called into [facility] pharmacy.

## 2013-04-28 NOTE — Telephone Encounter (Signed)
Please read below and advise.

## 2013-04-28 NOTE — Telephone Encounter (Signed)
Fine to call in 2 week supply on Clonazepam for him.

## 2013-05-03 ENCOUNTER — Telehealth: Payer: Self-pay | Admitting: *Deleted

## 2013-05-03 NOTE — Telephone Encounter (Signed)
We can send over a letter to the pharmacy, stating fine to release medication early

## 2013-05-03 NOTE — Telephone Encounter (Signed)
He has already picked up the 30 tablets from local pharmacy, now he wants [facility] to send him the other refills early as well. He has enough to last him until they ship out his next shipment on 11/14 Friday of this week.

## 2013-05-03 NOTE — Telephone Encounter (Signed)
[facility] called stating the patient called them requesting he get a refill early because he lost his medication. They will release his prescription on the 14th of this month. She saw where a refill was done at the local pharmacy for #30, which should last him until he gets his shipment from them. Need orders from his MD to release this medication early

## 2013-05-04 NOTE — Telephone Encounter (Signed)
OK. We can send a letter to [facility] to send other refills early, but why would he need both?

## 2013-05-04 NOTE — Telephone Encounter (Signed)
Called and spoke with pharmacist Georga Hacking, gave her the ok to release Klonopin early to patient.

## 2013-07-03 ENCOUNTER — Telehealth: Payer: Self-pay | Admitting: Internal Medicine

## 2013-07-03 ENCOUNTER — Telehealth: Payer: Self-pay

## 2013-07-03 NOTE — Telephone Encounter (Signed)
Spoke w/ pt.  He reports pain behind his sternum since 8:00am, describes it as a throbbing sensation, does not radiate, relieved by sitting still. Denies sob, nausea, vomiting or sweating. Admits that he has gallstones which were diagnosed on echo several years ago, but have not been addressed, as they have not previously bothered him. Reports "bad esophagus" that he takes nexium daily for.  Pt reports that he had breakfast this am of cheerios, which usually doesn't bother him.   Pt is concerned that symptoms could be gi vs cardiac.  After asking pt about heart related symptoms, he thinks that pain may be r/t gallstones. Offered to have pt come in this afternoon for EKG, but he states that he will call PCP's office.  Offered pt appt to come in to see Dr. Rockey Situ but he reports that he will call back if PCP cannot see him.

## 2013-07-03 NOTE — Telephone Encounter (Signed)
At this point, if having severe pain, would need to go to urgent care or ED. Alternatively,we can try to work him in tomorrow?

## 2013-07-03 NOTE — Telephone Encounter (Signed)
Fwd to Dr. Gilford Rile, never received message from triage. Please advise.

## 2013-07-03 NOTE — Telephone Encounter (Signed)
No answer or voicemail to leave a message. 

## 2013-07-03 NOTE — Telephone Encounter (Signed)
Pt states he is having some trouble he thinks with gallstones.  Says it started this morning.  States he has spoken with heart care and they do not feel it is heart related.  States he needs to be seen today.  No appt available.  Transferred to Triage.

## 2013-07-03 NOTE — Telephone Encounter (Signed)
Patient Information:  Caller Name: Katie  Phone: [phone]  Patient: Timothy Turner, Timothy Turner  Gender: Male  DOB: [birth year]  Age: 73 Years  PCP: Ronette Deter (Adults only)  Office Follow Up:  Does the office need to follow up with this patient?: No  Instructions For The Office: N/A   Symptoms  Reason For Call & Symptoms: Pt calling that he is having midsternum pain that wraps around to his left back side.  He thinks it may be his gallstones.   Pain started at 0800 this AM.  The last 2-3 weeks he has had intermittent pain up under his sternum.  Reviewed Health History In EMR: Yes  Reviewed Medications In EMR: Yes  Reviewed Allergies In EMR: Yes  Reviewed Surgeries / Procedures: Yes  Date of Onset of Symptoms: 07/03/2013  Guideline(s) Used:  Chest Pain  Disposition Per Guideline:   Call EMS 911 Now  Reason For Disposition Reached:   Chest pain lasting longer than 5 minutes and ANY of the following:  Over 50 years old Over 53 years old and at least one cardiac risk factor (i.e., high blood pressure, diabetes, high cholesterol, obesity, smoker or strong family history of heart disease) Pain is crushing, pressure-like, or heavy  Took nitroglycerin and chest pain was not relieved History of heart disease (i.e., angina, heart attack, bypass surgery, angioplasty, CHF)  Advice Given:  N/A  Patient Refused Recommendation:  Patient Will Follow Up With Office Later  Pt states he does not want to go to the ED or an U/C.   Appt made with R/Ray for 07/04/13 at 0945.  Call back instructions given.

## 2013-07-03 NOTE — Telephone Encounter (Signed)
Pt called and states he has a dull, pain, in the front of his ribcage, "in the V"  when he breathes hard, "it hurts a lot", pt states he thinks it is his gallstones, but wants to make sure it is not his heart.

## 2013-07-04 ENCOUNTER — Encounter: Payer: Self-pay | Admitting: Adult Health

## 2013-07-04 ENCOUNTER — Ambulatory Visit (INDEPENDENT_AMBULATORY_CARE_PROVIDER_SITE_OTHER): Payer: 59 | Admitting: Adult Health

## 2013-07-04 ENCOUNTER — Ambulatory Visit: Payer: Self-pay | Admitting: Adult Health

## 2013-07-04 VITALS — BP 140/80 | HR 56 | Temp 97.5°F | Resp 12 | Wt 177.0 lb

## 2013-07-04 DIAGNOSIS — R1013 Epigastric pain: Secondary | ICD-10-CM | POA: Insufficient documentation

## 2013-07-04 DIAGNOSIS — Z8719 Personal history of other diseases of the digestive system: Secondary | ICD-10-CM

## 2013-07-04 DIAGNOSIS — R079 Chest pain, unspecified: Secondary | ICD-10-CM

## 2013-07-04 LAB — COMPREHENSIVE METABOLIC PANEL
ALBUMIN: 4.3 g/dL (ref 3.5–5.2)
ALK PHOS: 71 U/L (ref 39–117)
ALT: 25 U/L (ref 0–53)
AST: 25 U/L (ref 0–37)
BUN: 11 mg/dL (ref 6–23)
CO2: 29 mEq/L (ref 19–32)
CREATININE: 1.1 mg/dL (ref 0.4–1.5)
Calcium: 10.1 mg/dL (ref 8.4–10.5)
Chloride: 106 mEq/L (ref 96–112)
GFR: 73.37 mL/min (ref >60.00)
Glucose, Bld: 93 mg/dL (ref 70–99)
POTASSIUM: 5.1 meq/L (ref 3.5–5.1)
Sodium: 142 mEq/L (ref 135–145)
Total Bilirubin: 1.1 mg/dL (ref 0.3–1.2)
Total Protein: 6.9 g/dL (ref 6.0–8.3)

## 2013-07-04 LAB — TROPONIN I: Troponin I: 0.02 ng/mL (ref <0.06)

## 2013-07-04 NOTE — Telephone Encounter (Signed)
Appt today with Raquel

## 2013-07-04 NOTE — Telephone Encounter (Signed)
Fwd to Dr. Walker 

## 2013-07-04 NOTE — Progress Notes (Signed)
   Subjective:    Patient ID: Timothy Turner, male    DOB: 12-Dec-1940, 73 y.o.   MRN: [id number]  HPI  Pt presents to clinic with c/o substernal/epigastric pain which radiated to the left and around to the back. He spoke to CAN who recommended calling 911 and going to the ED but he did not want to do this. He reports that pain started around 8 AM yesterday morning and lasted until 7 pm. He took 2 Zantac 75 mg tablets at 11 AM but symptoms did not improve. He barely ate anything yesterday. Reports hx of gallstones, barrett's esophagus. He denied shortness of breath with any episode or any syncope. Pt also called Dr. Donivan Scull office and he suggested he come to his office for EKG but pt did not want to go.   Past Medical History  Diagnosis Date  . Heart murmur     History of  . Mitral valve disease   . Hypertension   . Hemorrhoids   . Psoriasis   . Thyroiditis   . Rheumatic heart valve incompetence   . Pain syndrome, chronic     Severe  . Myalgia     Multiple  . Prostate cancer   . Gallstones   . Barrett esophagus     Review of Systems  Constitutional: Negative.   HENT: Negative.   Respiratory: Negative.  Negative for cough, chest tightness and shortness of breath.   Cardiovascular: Positive for chest pain.       Substernal chest pain  Gastrointestinal: Positive for abdominal pain. Negative for nausea and vomiting.       Epigastric pain radiating to his back  Genitourinary: Negative.   Musculoskeletal: Negative.   Psychiatric/Behavioral: Negative.   All other systems reviewed and are negative.       Objective:   Physical Exam  Constitutional: He is oriented to person, place, and time. He appears well-developed and well-nourished. No distress.  Cardiovascular: Normal rate and regular rhythm.   Murmur heard. Pulmonary/Chest: Effort normal and breath sounds normal.  Abdominal: Soft. Bowel sounds are normal. There is tenderness.  RUQ tenderness  Musculoskeletal: Normal  range of motion.  Neurological: He is alert and oriented to person, place, and time.  Skin: Skin is warm and dry.  Psychiatric: He has a normal mood and affect. His behavior is normal. Judgment and thought content normal.    BP 140/80  Pulse 56  Temp(Src) 97.5 F (36.4 C) (Oral)  Resp 12  Wt 177 lb (80.287 kg)  SpO2 96%       Assessment & Plan:

## 2013-07-04 NOTE — Progress Notes (Signed)
Pre visit review using our clinic review tool, if applicable. No additional management support is needed unless otherwise documented below in the visit note. 

## 2013-07-04 NOTE — Assessment & Plan Note (Signed)
EKG without any acute finding. Check troponin

## 2013-07-04 NOTE — Assessment & Plan Note (Signed)
Abdominal ultrasound. Check cmet.

## 2013-07-10 ENCOUNTER — Other Ambulatory Visit: Payer: Self-pay | Admitting: Adult Health

## 2013-07-10 DIAGNOSIS — K8020 Calculus of gallbladder without cholecystitis without obstruction: Secondary | ICD-10-CM

## 2013-07-10 NOTE — Telephone Encounter (Signed)
Called and advised patient of results. Willing to proceed with referral. Would prefer not to go to [facility]. Advised pt that our patient care coordinator would call him with an appointment.

## 2013-07-27 ENCOUNTER — Ambulatory Visit (INDEPENDENT_AMBULATORY_CARE_PROVIDER_SITE_OTHER): Payer: Medicare Other | Admitting: General Surgery

## 2013-07-27 ENCOUNTER — Other Ambulatory Visit: Payer: Self-pay | Admitting: General Surgery

## 2013-07-27 ENCOUNTER — Encounter: Payer: Self-pay | Admitting: General Surgery

## 2013-07-27 VITALS — BP 136/74 | HR 72 | Resp 14 | Ht 72.0 in | Wt 178.0 lb

## 2013-07-27 DIAGNOSIS — K8020 Calculus of gallbladder without cholecystitis without obstruction: Secondary | ICD-10-CM

## 2013-07-27 NOTE — Progress Notes (Signed)
Patient ID: Timothy Turner, male   DOB: March 21, 1941, 73 y.o.   MRN: [id number]  Chief Complaint  Patient presents with  . Abdominal Pain    gallstones    HPI Timothy Turner is a 73 y.o. male.  Here today for evaluation of gall stones referred by Dr Ronette Deter. Ultrasound done 07-04-13. He states he is having lower sternum pain that wraps around to his left back side.  The pain episode that was really bad was several weeks ago that lasted about 12 hours. Prior to the first severe pain he seemed to have more indigestion symptoms. Vomited once with first episode none since.  Since then the pain has had intermittent pain up under his sternum/right upper abdominal pain that last 3 minutes to 3 hours. Mostly occuring in the middle of the night around 1 am.  HPI  Past Medical History  Diagnosis Date  . Heart murmur     History of  . Mitral valve disease   . Hypertension   . Hemorrhoids   . Psoriasis   . Thyroiditis   . Rheumatic heart valve incompetence   . Pain syndrome, chronic     Severe  . Myalgia     Multiple  . Prostate cancer   . Gallstones   . Barrett esophagus     Past Surgical History  Procedure Laterality Date  . Back surgery    . Hemorroidectomy    . Upper gi endoscopy  03-06-2013    Dr Donnella Sham  . Colonoscopy  03-08-12    Dr Donnella Sham    Family History  Problem Relation Age of Onset  . Heart failure Mother   . Heart failure Father   . Cancer Sister   . Aneurysm Sister     Brain    Social History History  Substance Use Topics  . Smoking status: Former Smoker    Quit date: 06/22/2004  . Smokeless tobacco: Never Used  . Alcohol Use: No    Allergies  Allergen Reactions  . Cymbalta [Duloxetine Hcl]     Unable to urinate  . Rosuvastatin   . Statins     Current Outpatient Prescriptions  Medication Sig Dispense Refill  . calcipotriene (DOVONEX) 0.005 % cream Apply topically.        . celecoxib (CELEBREX) 200 MG capsule Take 1 capsule (200  mg total) by mouth 2 (two) times daily.  180 capsule  3  . cetirizine (ZYRTEC) 10 MG tablet Take 10 mg by mouth daily.      . Cholecalciferol (VITAMIN D HIGH POTENCY) 1000 UNITS capsule Take 1,000 Units by mouth daily. Take one tablet by mouth two times a day.       . clobetasol (TEMOVATE) 0.05 % cream Apply topically 2 (two) times daily.        . clonazePAM (KLONOPIN) 1 MG tablet Take 1-2 tablets (1-2 mg total) by mouth 3 (three) times daily as needed (severe muscle spasm).  30 tablet  0  . esomeprazole (NEXIUM) 40 MG capsule Take 40 mg by mouth daily before breakfast.        . finasteride (PROSCAR) 5 MG tablet Take 5 mg by mouth daily.        . Glucosamine-Chondroitin (OSTEO BI-FLEX REGULAR STRENGTH) 250-200 MG TABS Take by mouth. Take one tablet daily.       Timothy Turner levothyroxine (SYNTHROID, LEVOTHROID) 75 MCG tablet TAKE 1 TABLET DAILY  90 tablet  3  . loratadine (CLARITIN) 10 MG tablet Take 10  mg by mouth daily.        . metoprolol tartrate (LOPRESSOR) 25 MG tablet Take 25 mg by mouth 2 (two) times daily as needed. As needed for palpitations      . Multiple Vitamins-Minerals (CENTRUM SILVER PO) Take by mouth.        . Omega-3 Fatty Acids (FISH OIL) 1000 MG CAPS Take 1,000 mg by mouth. Take two tablets daily.       . Testosterone (ANDROGEL PUMP) 1.25 GM/ACT (1%) GEL Place onto the skin daily.      [name] 625 MG tablet TAKE 3 TABLETS TWICE DAILY  540 tablet  3  . zolpidem (AMBIEN) 10 MG tablet Take 1 tablet (10 mg total) by mouth at bedtime.  90 tablet  1   No current facility-administered medications for this visit.    Review of Systems Review of Systems  Constitutional: Negative.   Respiratory: Negative.   Cardiovascular: Negative.   Gastrointestinal: Positive for abdominal pain. Negative for nausea, vomiting, diarrhea and constipation.    Blood pressure 136/74, pulse 72, resp. rate 14, height 6' (1.829 m), weight 178 lb (80.74 kg).  Physical Exam Physical Exam  Constitutional: He  is oriented to person, place, and time. He appears well-developed and well-nourished.  Neck: Neck supple.  Cardiovascular: Normal rate and regular rhythm.   Murmur heard.  Systolic murmur is present with a grade of 2/6  Pulmonary/Chest: Effort normal and breath sounds normal.  Abdominal: Soft. Normal appearance and bowel sounds are normal. There is no tenderness.  Lymphadenopathy:    He has no cervical adenopathy.  Neurological: He is alert and oriented to person, place, and time.  Skin: Skin is warm and dry.    Data Reviewed Abdominal ultrasound dated July 04, 2013 was reviewed. Multiple gallstones. Gallbladder wall thickness 2.4 mm. No pericholecystic fluid. Common bile duct 6.1 mm.  CBC and comprehensive metabolic panel completely within the last year are unremarkable.  Assessment    Clinical history compatible with chronic cholecystitis, cholelithiasis on ultrasound.     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 The pros and cons of surgical intervention were reviewed with the patient and his wife.  Laparoscopic Cholecystectomy with Intraoperative Cholangiogram. The procedure, including it's potential risks and complications (including but not limited to infection, bleeding, injury to intra-abdominal organs or bile ducts, bile leak, poor cosmetic result, sepsis and death) were discussed with the patient in detail. Non-operative options, including their inherent risks (acute calculous cholecystitis with possible choledocholithiasis or gallstone pancreatitis, with the risk of ascending cholangitis, sepsis, and death) were discussed as well. The patient expressed and understanding of what we discussed and wishes to proceed with laparoscopic cholecystectomy. The patient further understands that if it is  technically not possible, or it is unsafe to proceed laparoscopically, that I will convert to an open cholecystectomy.    Patient's surgery has been scheduled for 08-03-13 at [facility].   [name] 07/27/2013, 9:01 PM

## 2013-07-27 NOTE — Patient Instructions (Addendum)
Laparoscopic Cholecystectomy Laparoscopic cholecystectomy is surgery to remove the gallbladder. The gallbladder is located in the upper right part of the abdomen, behind the liver. It is a storage sac for bile produced in the liver. Bile aids in the digestion and absorption of fats. Cholecystectomy is often done for inflammation of the gallbladder (cholecystitis). This condition is usually caused by a buildup of gallstones (cholelithiasis) in your gallbladder. Gallstones can block the flow of bile, resulting in inflammation and pain. In severe cases, emergency surgery may be required. When emergency surgery is not required, you will have time to prepare for the procedure. Laparoscopic surgery is an alternative to open surgery. Laparoscopic surgery has a shorter recovery time. Your common bile duct may also need to be examined during the procedure. If stones are found in the common bile duct, they may be removed. LET [facility] CARE PROVIDER KNOW ABOUT:  Any allergies you have.  All medicines you are taking, including vitamins, herbs, eye drops, creams, and over-the-counter medicines.  Previous problems you or members of your family have had with the use of anesthetics.  Any blood disorders you have.  Previous surgeries you have had.  Medical conditions you have. RISKS AND COMPLICATIONS Generally, this is a safe procedure. However, as with any procedure, complications can occur. Possible complications include:  Infection.  Damage to the common bile duct, nerves, arteries, veins, or other internal organs such as the stomach, liver, or intestines.  Bleeding.  A stone may remain in the common bile duct.  A bile leak from the cyst duct that is clipped when your gallbladder is removed.  The need to convert to open surgery, which requires a larger incision in the abdomen. This may be necessary if your surgeon thinks it is not safe to continue with a laparoscopic procedure. BEFORE THE  PROCEDURE  Ask your health care provider about changing or stopping any regular medicines. You will need to stop taking aspirin or blood thinners at least 5 days prior to surgery.  Do not eat or drink anything after midnight the night before surgery.  Let your health care provider know if you develop a cold or other infectious problem before surgery. PROCEDURE   You will be given medicine to make you sleep through the procedure (general anesthetic). A breathing tube will be placed in your mouth.  When you are asleep, your surgeon will make several small cuts (incisions) in your abdomen.  A thin, lighted tube with a tiny camera on the end (laparoscope) is inserted through one of the small incisions. The camera on the laparoscope sends a picture to a TV screen in the operating room. This gives the surgeon a good view inside your abdomen.  A gas will be pumped into your abdomen. This expands your abdomen so that the surgeon has more room to perform the surgery.  Other tools needed for the procedure are inserted through the other incisions. The gallbladder is removed through one of the incisions.  After the removal of your gallbladder, the incisions will be closed with stitches, staples, or skin glue. AFTER THE PROCEDURE  You will be taken to a recovery area where your progress will be checked often.  You may be allowed to go home the same day if your pain is controlled and you can tolerate liquids. Document Released: 06/08/2005 Document Revised: 03/29/2013 Document Reviewed: 01/18/2013 [facility] Patient Information 2014 [place].  Patient's surgery has been scheduled for 08-03-13 at [facility].

## 2013-07-31 ENCOUNTER — Ambulatory Visit: Payer: Self-pay | Admitting: General Surgery

## 2013-07-31 LAB — CBC WITH DIFFERENTIAL/PLATELET
BASOS PCT: 0.9 %
Basophil #: 0.1 10*3/uL (ref 0.0–0.1)
Eosinophil #: 0.1 10*3/uL (ref 0.0–0.7)
Eosinophil %: 2.2 %
HCT: 44.3 % (ref 40.0–52.0)
HGB: 14.9 g/dL (ref 13.0–18.0)
Lymphocyte #: 2 10*3/uL (ref 1.0–3.6)
Lymphocyte %: 31.4 %
MCH: 30.3 pg (ref 26.0–34.0)
MCHC: 33.6 g/dL (ref 32.0–36.0)
MCV: 90 fL (ref 80–100)
MONO ABS: 0.7 x10 3/mm (ref 0.2–1.0)
Monocyte %: 11.2 %
NEUTROS PCT: 54.3 %
Neutrophil #: 3.5 10*3/uL (ref 1.4–6.5)
PLATELETS: 169 10*3/uL (ref 150–440)
RBC: 4.91 10*6/uL (ref 4.40–5.90)
RDW: 14.1 % (ref 11.5–14.5)
WBC: 6.4 10*3/uL (ref 3.8–10.6)

## 2013-08-03 ENCOUNTER — Ambulatory Visit: Payer: Self-pay | Admitting: General Surgery

## 2013-08-03 DIAGNOSIS — K8010 Calculus of gallbladder with chronic cholecystitis without obstruction: Secondary | ICD-10-CM

## 2013-08-03 HISTORY — PX: CHOLECYSTECTOMY: SHX55

## 2013-08-04 ENCOUNTER — Encounter: Payer: Self-pay | Admitting: General Surgery

## 2013-08-04 LAB — PATHOLOGY REPORT

## 2013-08-06 ENCOUNTER — Other Ambulatory Visit: Payer: Self-pay | Admitting: Internal Medicine

## 2013-08-07 ENCOUNTER — Encounter: Payer: Self-pay | Admitting: General Surgery

## 2013-08-07 NOTE — Telephone Encounter (Signed)
Ok refill? 

## 2013-08-10 ENCOUNTER — Encounter: Payer: Self-pay | Admitting: General Surgery

## 2013-08-10 ENCOUNTER — Ambulatory Visit (INDEPENDENT_AMBULATORY_CARE_PROVIDER_SITE_OTHER): Payer: Medicare Other | Admitting: General Surgery

## 2013-08-10 VITALS — BP 140/78 | HR 54 | Resp 12 | Ht 72.0 in

## 2013-08-10 DIAGNOSIS — K8020 Calculus of gallbladder without cholecystitis without obstruction: Secondary | ICD-10-CM

## 2013-08-10 NOTE — Progress Notes (Signed)
Patient ID: Timothy Turner, male   DOB: [birth year], 73 y.o.   MRN: [id number]  Chief Complaint  Patient presents with  . Routine Post Op    gallbladder    HPI Timothy Turner is a 73 y.o. male here today for his post op gallbladder surgery done 08/03/13. Patient states he is doing well.  HPI  Past Medical History  Diagnosis Date  . Heart murmur     History of  . Mitral valve disease   . Hypertension   . Hemorrhoids   . Psoriasis   . Thyroiditis   . Rheumatic heart valve incompetence   . Pain syndrome, chronic     Severe  . Myalgia     Multiple  . Prostate cancer   . Gallstones   . Barrett esophagus     Past Surgical History  Procedure Laterality Date  . Back surgery    . Hemorroidectomy    . Upper gi endoscopy  03-06-2013    Dr Donnella Sham  . Colonoscopy  03-08-12    Dr Donnella Sham  . Cholecystectomy  08/03/13    Family History  Problem Relation Age of Onset  . Heart failure Mother   . Heart failure Father   . Cancer Sister   . Aneurysm Sister     Brain    Social History History  Substance Use Topics  . Smoking status: Former Smoker    Quit date: 06/22/2004  . Smokeless tobacco: Never Used  . Alcohol Use: No    Allergies  Allergen Reactions  . Cymbalta [Duloxetine Hcl]     Unable to urinate  . Rosuvastatin   . Statins     Current Outpatient Prescriptions  Medication Sig Dispense Refill  . calcipotriene (DOVONEX) 0.005 % cream Apply topically.        . celecoxib (CELEBREX) 200 MG capsule TAKE 1 CAPSULE TWICE A DAY  180 capsule  2  . cetirizine (ZYRTEC) 10 MG tablet Take 10 mg by mouth daily.      . Cholecalciferol (VITAMIN D HIGH POTENCY) 1000 UNITS capsule Take 1,000 Units by mouth daily. Take one tablet by mouth two times a day.       . clobetasol (TEMOVATE) 0.05 % cream Apply topically 2 (two) times daily.        . clonazePAM (KLONOPIN) 1 MG tablet Take 1-2 tablets (1-2 mg total) by mouth 3 (three) times daily as needed (severe muscle spasm).   30 tablet  0  . esomeprazole (NEXIUM) 40 MG capsule Take 40 mg by mouth daily before breakfast.        . finasteride (PROSCAR) 5 MG tablet Take 5 mg by mouth daily.        . Glucosamine-Chondroitin (OSTEO BI-FLEX REGULAR STRENGTH) 250-200 MG TABS Take by mouth. Take one tablet daily.       [name] levothyroxine (SYNTHROID, LEVOTHROID) 75 MCG tablet TAKE 1 TABLET DAILY  90 tablet  3  . loratadine (CLARITIN) 10 MG tablet Take 10 mg by mouth daily.        . metoprolol tartrate (LOPRESSOR) 25 MG tablet Take 25 mg by mouth 2 (two) times daily as needed. As needed for palpitations      . Multiple Vitamins-Minerals (CENTRUM SILVER PO) Take by mouth.        . Omega-3 Fatty Acids (FISH OIL) 1000 MG CAPS Take 1,000 mg by mouth. Take two tablets daily.       . Testosterone (ANDROGEL PUMP) 1.25 GM/ACT (1%) GEL Place  onto the skin daily.      Timothy Turner 625 MG tablet TAKE 3 TABLETS TWICE DAILY  540 tablet  3  . zolpidem (AMBIEN) 10 MG tablet Take 1 tablet (10 mg total) by mouth at bedtime.  90 tablet  1   No current facility-administered medications for this visit.    Review of Systems Review of Systems  Constitutional: Negative.   Respiratory: Negative.   Cardiovascular: Negative.     Blood pressure 140/78, pulse 54, resp. rate 12, height 6' (1.829 m).  Physical Exam Physical Exam  Constitutional: He is oriented to person, place, and time. He appears well-developed and well-nourished.  Eyes: Conjunctivae are normal.  Cardiovascular: Normal rate, regular rhythm and normal heart sounds.   Pulmonary/Chest: Breath sounds normal.  Abdominal: Soft. Normal appearance and bowel sounds are normal.  Port site looks clean and healing well.   Neurological: He is alert and oriented to person, place, and time.  Skin: Skin is warm and dry.    Data Reviewed Pathology showed chronic cholecystitis and cholelithiasis.  Assessment    Doing well status post cholecystectomy to    Plan    The patient was  encouraged to increase activity as tolerated. Care was lifting was discussed. Safe lifting techniques demonstrated. Followup will be on an as-needed basis.       Timothy Turner 08/11/2013, 12:38 PM

## 2013-08-10 NOTE — Patient Instructions (Signed)
Proper lifting techniques reviewed. Patient to return as needed.  

## 2013-08-11 ENCOUNTER — Telehealth: Payer: Self-pay | Admitting: *Deleted

## 2013-08-11 NOTE — Telephone Encounter (Signed)
Timothy Turner, could you please start PA on this?

## 2013-08-11 NOTE — Telephone Encounter (Signed)
Will need to try to get prior auth for Celebrex, as pt cannot take other NSAIDS.

## 2013-08-11 NOTE — Telephone Encounter (Signed)
Timothy Turner from [facility] left a message in reference to patient Celebrex. Insurance will not cover this medication, there are other preferred medications the patient could try. Please advise  Reference # [id number]  [phone]

## 2013-08-14 NOTE — Telephone Encounter (Signed)
Received PA request form for the Celebrex placed in Dr.walkers box

## 2013-08-20 ENCOUNTER — Other Ambulatory Visit: Payer: Self-pay | Admitting: Cardiovascular Disease

## 2013-08-22 ENCOUNTER — Ambulatory Visit (INDEPENDENT_AMBULATORY_CARE_PROVIDER_SITE_OTHER): Payer: 59 | Admitting: Internal Medicine

## 2013-08-22 ENCOUNTER — Encounter: Payer: Self-pay | Admitting: Internal Medicine

## 2013-08-22 VITALS — BP 140/80 | HR 59 | Temp 97.7°F | Wt 180.0 lb

## 2013-08-22 DIAGNOSIS — G894 Chronic pain syndrome: Secondary | ICD-10-CM

## 2013-08-22 DIAGNOSIS — S91009A Unspecified open wound, unspecified ankle, initial encounter: Secondary | ICD-10-CM

## 2013-08-22 DIAGNOSIS — IMO0001 Reserved for inherently not codable concepts without codable children: Secondary | ICD-10-CM

## 2013-08-22 DIAGNOSIS — M791 Myalgia, unspecified site: Secondary | ICD-10-CM

## 2013-08-22 DIAGNOSIS — S81009A Unspecified open wound, unspecified knee, initial encounter: Secondary | ICD-10-CM | POA: Insufficient documentation

## 2013-08-22 DIAGNOSIS — S81809A Unspecified open wound, unspecified lower leg, initial encounter: Secondary | ICD-10-CM

## 2013-08-22 MED ORDER — CLONAZEPAM 1 MG PO TABS: 1.0000 mg | ORAL_TABLET | Freq: Three times a day (TID) | ORAL | Status: DC | PRN

## 2013-08-22 MED ORDER — GENTAMICIN SULFATE 0.1 % EX CREA: 1.0000 "application " | TOPICAL_CREAM | Freq: Three times a day (TID) | CUTANEOUS | Status: DC

## 2013-08-22 NOTE — Progress Notes (Signed)
Subjective:    Patient ID: Timothy Turner, male    DOB: Nov 04, 1940, 73 y.o.   MRN: [id number]  HPI 73YO male presents for follow up.  Recently underwent cholecystectomy 2/12. Tolerated procedure well. No complications. No further abdominal pain.  Fell in January on right knee, had skin tear. Now has new blister at site. Area recently drained clear fluid.  No fever or chills. No pain at site.  Chronic pain well controlled with Celebrex and Clonazepam. No side effects noted from medication. Continues to walk almost daily.    Review of Systems  Constitutional: Negative for fever, chills, activity change, appetite change, fatigue and unexpected weight change.  Eyes: Negative for visual disturbance.  Respiratory: Negative for cough and shortness of breath.   Cardiovascular: Negative for chest pain, palpitations and leg swelling.  Gastrointestinal: Negative for abdominal pain and abdominal distention.  Genitourinary: Negative for dysuria, urgency and difficulty urinating.  Musculoskeletal: Positive for myalgias. Negative for arthralgias and gait problem.  Skin: Positive for color change and wound. Negative for rash.  Hematological: Negative for adenopathy.  Psychiatric/Behavioral: Negative for sleep disturbance and dysphoric mood. The patient is not nervous/anxious.        Objective:    BP 140/80  Pulse 59  Temp(Src) 97.7 F (36.5 C) (Oral)  Wt 180 lb (81.647 kg)  SpO2 96% Physical Exam  Constitutional: He is oriented to person, place, and time. He appears well-developed and well-nourished. No distress.  HENT:  Head: Normocephalic and atraumatic.  Right Ear: External ear normal.  Left Ear: External ear normal.  Nose: Nose normal.  Mouth/Throat: Oropharynx is clear and moist. No oropharyngeal exudate.  Eyes: Conjunctivae and EOM are normal. Pupils are equal, round, and reactive to light. Right eye exhibits no discharge. Left eye exhibits no discharge. No scleral icterus.   Neck: Normal range of motion. Neck supple. No tracheal deviation present. No thyromegaly present.  Cardiovascular: Normal rate, regular rhythm and normal heart sounds.  Exam reveals no gallop and no friction rub.   No murmur heard. Pulmonary/Chest: Effort normal and breath sounds normal. No accessory muscle usage. Not tachypneic. No respiratory distress. He has no decreased breath sounds. He has no wheezes. He has no rhonchi. He has no rales. He exhibits no tenderness.  Abdominal: Soft. Bowel sounds are normal. He exhibits no distension. There is no tenderness.    Musculoskeletal: Normal range of motion. He exhibits no edema.  Lymphadenopathy:    He has no cervical adenopathy.  Neurological: He is alert and oriented to person, place, and time. No cranial nerve deficit. Coordination normal.  Skin: Skin is warm and dry. No rash noted. He is not diaphoretic. There is erythema. No pallor.     Psychiatric: He has a normal mood and affect. His behavior is normal. Judgment and thought content normal.          Assessment & Plan:   Problem List Items Addressed This Visit   Chronic pain syndrome - Primary     Symptoms well controlled on current medications. Will continue.    Relevant Medications      clonazePAM (KLONOPIN)  tablet   Open wound of knee, leg (except thigh), and ankle, complicated     Open wound right knee consistent with skin tear. Appears to be healing well. Will add topical gentamicin given minimal surrounding erythema. Plan recheck if wound not continuing to heal over next week.    Relevant Medications      gentamicin (GARAMYCIN) cream 0.1%  Other Visit Diagnoses   Muscle pain            Return in about 6 months (around 02/22/2014) for Wellness Visit.

## 2013-08-22 NOTE — Assessment & Plan Note (Signed)
Open wound right knee consistent with skin tear. Appears to be healing well. Will add topical gentamicin given minimal surrounding erythema. Plan recheck if wound not continuing to heal over next week.

## 2013-08-22 NOTE — Assessment & Plan Note (Signed)
Symptoms well controlled on current medications. Will continue. 

## 2013-08-22 NOTE — Patient Instructions (Signed)
Start Gentamicin to wound on right knee twice daily.  Call or email if wound is not improved by early next week.

## 2013-08-29 ENCOUNTER — Other Ambulatory Visit: Payer: Self-pay

## 2013-08-29 NOTE — Telephone Encounter (Signed)
Pt needs 90 day supply

## 2013-09-01 ENCOUNTER — Telehealth: Payer: Self-pay

## 2013-09-01 NOTE — Telephone Encounter (Signed)
Pt called and wanted his Welchol that he called yesterday to be called in, he wants it cancelled. He states that he needed it called in for 90 days, and it was only called in for 45 days, and he will just go without taking it for 45 days, states he is not going to pay another $100 to get 45 more days.

## 2013-09-01 NOTE — Telephone Encounter (Signed)
Spoke with patient regarding his 90 day supply for welchol; the patient states he will just take what he received in the mail for now and then when he comes to see Dr. Rockey Situ, he will have the Rx renewed. Told the patient to contact our office back is he changes his mind on getting more pills.

## 2013-09-14 ENCOUNTER — Ambulatory Visit (INDEPENDENT_AMBULATORY_CARE_PROVIDER_SITE_OTHER): Payer: Medicare Other | Admitting: Cardiovascular Disease

## 2013-09-14 ENCOUNTER — Encounter: Payer: Self-pay | Admitting: Cardiovascular Disease

## 2013-09-14 VITALS — BP 142/72 | HR 72 | Ht 72.0 in | Wt 183.2 lb

## 2013-09-14 DIAGNOSIS — I059 Rheumatic mitral valve disease, unspecified: Secondary | ICD-10-CM

## 2013-09-14 DIAGNOSIS — I340 Nonrheumatic mitral (valve) insufficiency: Secondary | ICD-10-CM | POA: Insufficient documentation

## 2013-09-14 DIAGNOSIS — E785 Hyperlipidemia, unspecified: Secondary | ICD-10-CM

## 2013-09-14 DIAGNOSIS — I739 Peripheral vascular disease, unspecified: Secondary | ICD-10-CM

## 2013-09-14 MED ORDER — COLESEVELAM HCL 625 MG PO TABS: 1875.0000 mg | ORAL_TABLET | Freq: Two times a day (BID) | ORAL | Status: DC

## 2013-09-14 NOTE — Assessment & Plan Note (Signed)
Prominent murmur on exam. He is relatively asymptomatic. No edema, no shortness of breath with exertion. Will monitor for now. He is not particularly interested in repeat echocardiogram to evaluate the valve. This could be done for worsening murmur, or clinical symptoms.

## 2013-09-14 NOTE — Assessment & Plan Note (Signed)
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 No further workup needed.

## 2013-09-14 NOTE — Patient Instructions (Signed)
You are doing well. No medication changes were made.  Please call us if you have new issues that need to be addressed before your next appt.  Your physician wants you to follow-up in: 12 months.  You will receive a reminder letter in the mail two months in advance. If you don't receive a letter, please call our office to schedule the follow-up appointment. 

## 2013-09-14 NOTE — Progress Notes (Signed)
Patient ID: THOMPSON MCKIM, male    DOB: 26-Mar-1941, 73 y.o.   MRN: [id number]  HPI Comments: Mr. 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   He continues to take clonazepam with significant relief of his muscle pain. Slowly weaning down the dosing from its times per day, now down to 5 times per day. At baseline, He is more active, is able to walk more. He reports going walking every morning. He walks approximately 45 minutes up to one hour per day.  Blood pressure has been relatively stable He denies any significant chest pain, lightheadedness, near syncope or syncope. He has stopped Niaspan secondary to severe flushing  EKG in January 2015 shows normal sinus rhythm with rate of 50 beats per minute, no significant ST or T wave changes, first degree AV block     Outpatient Encounter Prescriptions as of 09/14/2013  Medication Sig  . calcipotriene (DOVONEX) 0.005 % cream Apply topically.    . celecoxib (CELEBREX) 200 MG capsule TAKE 1 CAPSULE TWICE A DAY  . cetirizine (ZYRTEC) 10 MG tablet Take 10 mg by mouth daily.  . Cholecalciferol (VITAMIN D HIGH POTENCY) 1000 UNITS capsule Take 1,000 Units by mouth daily. Take one tablet by mouth two times a day.   . clobetasol (TEMOVATE) 0.05 % cream Apply topically 2 (two) times daily.    . clonazePAM (KLONOPIN) 1 MG tablet Take 1-2 tablets (1-2 mg total) by mouth 3 (three) times daily as needed (severe muscle spasm).  [name] esomeprazole (NEXIUM) 40 MG capsule Take 40 mg by mouth daily before breakfast.    . finasteride (PROSCAR) 5 MG tablet Take 5 mg by mouth daily.    [name] gentamicin cream (GARAMYCIN) 0.1 % Apply 1 application topically 3 (three) times daily.  .  Glucosamine-Chondroitin (OSTEO BI-FLEX REGULAR STRENGTH) 250-200 MG TABS Take by mouth. Take one tablet daily.   [name] levothyroxine (SYNTHROID, LEVOTHROID) 75 MCG tablet TAKE 1 TABLET DAILY  . loratadine (CLARITIN) 10 MG tablet Take 10 mg by mouth daily.    . metoprolol tartrate (LOPRESSOR) 25 MG tablet Take 25 mg by mouth 2 (two) times daily as needed. As needed for palpitations  . Multiple Vitamins-Minerals (CENTRUM SILVER PO) Take by mouth.    . Omega-3 Fatty Acids (FISH OIL) 1000 MG CAPS Take 1,000 mg by mouth. Take two tablets daily.   . Testosterone (ANDROGEL PUMP) 1.25 GM/ACT (1%) GEL Place onto the skin daily.  [name] 625 MG tablet TAKE 3 TABLETS TWICE DAILY    Review of Systems  Constitutional: Negative.   HENT: Negative.   Eyes: Negative.   Respiratory: Negative.   Cardiovascular: Negative.   Gastrointestinal: Negative.   Endocrine: Negative.   Musculoskeletal: Positive for myalgias.  Skin: Negative.   Allergic/Immunologic: Negative.   Neurological: Negative.   Hematological: Negative.   Psychiatric/Behavioral: Negative.   All other systems reviewed and are negative.    BP 142/72  Pulse 72  Ht 6' (1.829 m)  Wt 183 lb 4 oz (83.122 kg)  BMI 24.85 kg/m2  Physical Exam  Nursing note and vitals reviewed. Constitutional: He is oriented to person, place, and time. He appears well-developed and well-nourished.  HENT:  Head: Normocephalic.  Nose: Nose normal.  Mouth/Throat: Oropharynx is clear and moist.  Eyes: Conjunctivae are normal. Pupils are equal, round, and reactive to light.  Neck: Normal range of motion. Neck supple. No JVD present.  Cardiovascular: Normal rate, regular rhythm, S1 normal, S2 normal and intact distal pulses.  Exam reveals no gallop and no friction rub.   Murmur heard.  Systolic murmur is present with a grade of 2/6  Pulmonary/Chest: Effort normal and breath sounds normal. No respiratory distress. He has no wheezes. He has no rales. He exhibits  no tenderness.  Abdominal: Soft. Bowel sounds are normal. He exhibits no distension. There is no tenderness.  Musculoskeletal: Normal range of motion. He exhibits no edema and no tenderness.  Lymphadenopathy:    He has no cervical adenopathy.  Neurological: He is alert and oriented to person, place, and time. Coordination normal.  Skin: Skin is warm and dry. No rash noted. No erythema.  Psychiatric: He has a normal mood and affect. His behavior is normal. Judgment and thought content normal.      Assessment and Plan

## 2013-09-14 NOTE — Assessment & Plan Note (Signed)
Not a good candidate for statins given his chronic muscle ache. Continue on WelChol. He was unable to tolerate Niaspan secondary to flushing

## 2013-10-09 ENCOUNTER — Encounter: Payer: Self-pay | Admitting: General Surgery

## 2013-10-09 ENCOUNTER — Ambulatory Visit (INDEPENDENT_AMBULATORY_CARE_PROVIDER_SITE_OTHER): Payer: Medicare Other | Admitting: General Surgery

## 2013-10-09 VITALS — BP 120/82 | HR 64 | Temp 97.5°F | Resp 12 | Ht 72.0 in | Wt 179.0 lb

## 2013-10-09 DIAGNOSIS — R109 Unspecified abdominal pain: Secondary | ICD-10-CM

## 2013-10-09 DIAGNOSIS — R509 Fever, unspecified: Secondary | ICD-10-CM

## 2013-10-09 NOTE — Progress Notes (Signed)
Patient ID: Timothy Turner, male   DOB: September 01, 1940, 73 y.o.   MRN: [id number]  Chief Complaint  Patient presents with  . Follow-up    abdominal pain and fever post op lap chole    HPI [name] is a 73 y.o. male who presents for abdominal pain and fever. The patient is post op laproscopic cholecystectomy. The procedure was performed on 08/03/13.  The patient states the abdominal pain and fever started on Friday and has been constant since then. He complains of the abdominal pain radiating to his back. He also states when he walks he has to hold his stomach to help with the pain. The pain started at his naval but also has pain under his rib cage on the right and to a lesser extent left hand side. No vomiting or diarrhea. The pain started gradually. He states his fever got to 100.8. His appetite has decreased. His urine output has been clear. No problems with the bowels at this time.  The patient reports that this morning his pain began to abate. He did not have any pain bleeding, although he did report some decreased appetite secondary to the pain. He's been very active since his postop visit in late February, resuming all of his regular activities without difficulty.   HPI  Past Medical History  Diagnosis Date  . Heart murmur     History of  . Mitral valve disease   . Hypertension   . Hemorrhoids   . Psoriasis   . Thyroiditis   . Rheumatic heart valve incompetence   . Pain syndrome, chronic     Severe  . Myalgia     Multiple  . Prostate cancer   . Gallstones   . Barrett esophagus     Past Surgical History  Procedure Laterality Date  . Back surgery    . Hemorroidectomy    . Upper gi endoscopy  03-06-2013    Dr Donnella Sham  . Colonoscopy  03-08-12    Dr Donnella Sham  . Cholecystectomy  08/03/13    Family History  Problem Relation Age of Onset  . Heart failure Mother   . Heart failure Father   . Cancer Sister   . Aneurysm Sister     Brain    Social  History History  Substance Use Topics  . Smoking status: Former Smoker    Quit date: 06/22/2004  . Smokeless tobacco: Never Used  . Alcohol Use: No    Allergies  Allergen Reactions  . Cymbalta [Duloxetine Hcl]     Unable to urinate  . Rosuvastatin   . Statins     Current Outpatient Prescriptions  Medication Sig Dispense Refill  . calcipotriene (DOVONEX) 0.005 % cream Apply topically.        . celecoxib (CELEBREX) 200 MG capsule TAKE 1 CAPSULE TWICE A DAY  180 capsule  2  . cetirizine (ZYRTEC) 10 MG tablet Take 10 mg by mouth daily.      . Cholecalciferol (VITAMIN D HIGH POTENCY) 1000 UNITS capsule Take 1,000 Units by mouth daily. Take one tablet by mouth two times a day.       . clobetasol (TEMOVATE) 0.05 % cream Apply topically 2 (two) times daily.        . clonazePAM (KLONOPIN) 1 MG tablet Take 1-2 tablets (1-2 mg total) by mouth 3 (three) times daily as needed (severe muscle spasm).  180 tablet  3  . colesevelam (WELCHOL) 625 MG tablet Take 3 tablets (1,875 mg total)  by mouth 2 (two) times daily with a meal.  540 tablet  3  . esomeprazole (NEXIUM) 40 MG capsule Take 40 mg by mouth daily before breakfast.        . finasteride (PROSCAR) 5 MG tablet Take 5 mg by mouth daily.        [name] gentamicin cream (GARAMYCIN) 0.1 % Apply 1 application topically 3 (three) times daily.  15 g  0  . Glucosamine-Chondroitin (OSTEO BI-FLEX REGULAR STRENGTH) 250-200 MG TABS Take by mouth. Take one tablet daily.       [name] levothyroxine (SYNTHROID, LEVOTHROID) 75 MCG tablet TAKE 1 TABLET DAILY  90 tablet  3  . loratadine (CLARITIN) 10 MG tablet Take 10 mg by mouth daily.        . metoprolol tartrate (LOPRESSOR) 25 MG tablet Take 25 mg by mouth 2 (two) times daily as needed. As needed for palpitations      . Multiple Vitamins-Minerals (CENTRUM SILVER PO) Take by mouth.        . Omega-3 Fatty Acids (FISH OIL) 1000 MG CAPS Take 1,000 mg by mouth. Take two tablets daily.       . Testosterone (ANDROGEL PUMP)  1.25 GM/ACT (1%) GEL Place onto the skin daily.       No current facility-administered medications for this visit.    Review of Systems Review of Systems  Constitutional: Positive for fever.  Respiratory: Negative.   Cardiovascular: Negative.   Gastrointestinal: Positive for abdominal pain.    Blood pressure 120/82, pulse 64, temperature 97.5 F (36.4 C), temperature source Oral, resp. rate 12, height 6' (1.829 m), weight 179 lb (81.194 kg).  Physical Exam Physical Exam  Constitutional: He is oriented to person, place, and time. He appears well-developed and well-nourished.  Eyes: Conjunctivae are normal. No scleral icterus.  Neck: Neck supple. No thyromegaly present.  Cardiovascular: Normal rate, regular rhythm and normal heart sounds.   No murmur heard. Pulmonary/Chest: Effort normal and breath sounds normal.  Abdominal: Soft. Normal appearance and bowel sounds are normal. There is no hepatosplenomegaly. There is no tenderness. No hernia.  Lymphadenopathy:    He has no cervical adenopathy.  Neurological: He is alert and oriented to person, place, and time.  Skin: Skin is warm.  Skin is noted to be a little clammy.     Data Reviewed Operative cholangiogram showed reflux in the pancreatic duct but no evidence of retained stones or ductal stenosis. 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  Assessment    Abdominal pain, unclear etiology.     Plan    Considering his improvement over the course of this morning and is very benign exam, we'll arrange for laboratory studies looking for possible occult pancreatitis (which would be very surprising over 2 months post procedure) or late choledocholithiasis, again atypical with normal cholangiograms. He will be contacted with these laboratory results are available.     PCP: Bess Harvest Clark Cuff 10/09/2013, 8:56 PM

## 2013-10-09 NOTE — Patient Instructions (Signed)
Patient to have lab work drawn today. The patient is aware to call back for any questions or concerns.

## 2013-10-10 ENCOUNTER — Telehealth: Payer: Self-pay | Admitting: *Deleted

## 2013-10-10 LAB — CBC WITH DIFFERENTIAL
Basophils Absolute: 0.1 10*3/uL (ref 0.0–0.2)
Basos: 1 %
EOS ABS: 0.2 10*3/uL (ref 0.0–0.4)
Eos: 3 %
HCT: 45.2 % (ref 37.5–51.0)
Hemoglobin: 14.8 g/dL (ref 12.6–17.7)
IMMATURE GRANS (ABS): 0 10*3/uL (ref 0.0–0.1)
Immature Granulocytes: 0 %
Lymphocytes Absolute: 2.5 10*3/uL (ref 0.7–3.1)
Lymphs: 28 %
MCH: 29.5 pg (ref 26.6–33.0)
MCHC: 32.7 g/dL (ref 31.5–35.7)
MCV: 90 fL (ref 79–97)
Monocytes Absolute: 1.2 10*3/uL — ABNORMAL HIGH (ref 0.1–0.9)
Monocytes: 13 %
NEUTROS PCT: 55 %
Neutrophils Absolute: 5.1 10*3/uL (ref 1.4–7.0)
PLATELETS: 206 10*3/uL (ref 150–379)
RBC: 5.01 x10E6/uL (ref 4.14–5.80)
RDW: 13.7 % (ref 12.3–15.4)
WBC: 9 10*3/uL (ref 3.4–10.8)

## 2013-10-10 LAB — LIPASE: Lipase: 48 U/L (ref 0–59)

## 2013-10-10 LAB — HEPATIC FUNCTION PANEL
ALT: 19 IU/L (ref 0–44)
AST: 21 IU/L (ref 0–40)
Albumin: 4.3 g/dL (ref 3.5–4.8)
Alkaline Phosphatase: 75 IU/L (ref 39–117)
BILIRUBIN TOTAL: 0.6 mg/dL (ref 0.0–1.2)
Bilirubin, Direct: 0.14 mg/dL (ref 0.00–0.40)
Total Protein: 6.5 g/dL (ref 6.0–8.5)

## 2013-10-10 NOTE — Telephone Encounter (Signed)
Message copied by Carson Myrtle on Tue Oct 10, 2013  8:19 AM ------      Message from: [name]      Created: Tue Oct 10, 2013  7:36 AM       Please notify the patient all his labs were normal. .      Seen yesterday w/ 3 day history of abdominal pain and fever, but improving as of yesterday PM.      See how he is doing, if pain better and no recurrent fevers, we will simply observe for the present.       ----- Message -----         From: [facility] Lab Results In Interface         Sent: 10/10/2013   5:43 AM           To: [name], MD                   ------

## 2013-10-10 NOTE — Telephone Encounter (Signed)
Notified patient as instructed, patient pleased. Patient states his abdominal pain has improved since yesterday .

## 2013-10-12 ENCOUNTER — Emergency Department: Payer: Self-pay | Admitting: Emergency Medicine

## 2013-10-22 ENCOUNTER — Emergency Department: Payer: Self-pay | Admitting: Emergency Medicine

## 2013-10-26 ENCOUNTER — Emergency Department: Payer: Self-pay | Admitting: Emergency Medicine

## 2013-12-19 ENCOUNTER — Ambulatory Visit (INDEPENDENT_AMBULATORY_CARE_PROVIDER_SITE_OTHER): Payer: Medicare Other | Admitting: Internal Medicine

## 2013-12-19 ENCOUNTER — Encounter: Payer: Self-pay | Admitting: Internal Medicine

## 2013-12-19 VITALS — BP 158/74 | HR 62 | Temp 97.6°F | Ht 72.5 in | Wt 177.5 lb

## 2013-12-19 DIAGNOSIS — Z0000 Encounter for general adult medical examination without abnormal findings: Secondary | ICD-10-CM

## 2013-12-19 DIAGNOSIS — E785 Hyperlipidemia, unspecified: Secondary | ICD-10-CM

## 2013-12-19 DIAGNOSIS — R252 Cramp and spasm: Secondary | ICD-10-CM

## 2013-12-19 DIAGNOSIS — Z23 Encounter for immunization: Secondary | ICD-10-CM

## 2013-12-19 LAB — COMPREHENSIVE METABOLIC PANEL
ALBUMIN: 4.3 g/dL (ref 3.5–5.2)
ALT: 18 U/L (ref 0–53)
AST: 25 U/L (ref 0–37)
Alkaline Phosphatase: 66 U/L (ref 39–117)
BUN: 14 mg/dL (ref 6–23)
CALCIUM: 9.4 mg/dL (ref 8.4–10.5)
CHLORIDE: 104 meq/L (ref 96–112)
CO2: 28 mEq/L (ref 19–32)
Creatinine, Ser: 1.4 mg/dL (ref 0.4–1.5)
GFR: 52.29 mL/min — AB (ref >60.00)
Glucose, Bld: 103 mg/dL — ABNORMAL HIGH (ref 70–99)
POTASSIUM: 5 meq/L (ref 3.5–5.1)
Sodium: 138 mEq/L (ref 135–145)
Total Bilirubin: 0.7 mg/dL (ref 0.2–1.2)
Total Protein: 7.1 g/dL (ref 6.0–8.3)

## 2013-12-19 LAB — CBC WITH DIFFERENTIAL/PLATELET
BASOS ABS: 0.1 10*3/uL (ref 0.0–0.1)
Basophils Relative: 0.7 % (ref 0.0–3.0)
Eosinophils Absolute: 0.1 10*3/uL (ref 0.0–0.7)
Eosinophils Relative: 1.6 % (ref 0.0–5.0)
HCT: 46.2 % (ref 39.0–52.0)
HEMOGLOBIN: 15.4 g/dL (ref 13.0–17.0)
LYMPHS PCT: 35.3 % (ref 12.0–46.0)
Lymphs Abs: 2.5 10*3/uL (ref 0.7–4.0)
MCHC: 33.4 g/dL (ref 30.0–36.0)
MCV: 89.5 fl (ref 78.0–100.0)
MONOS PCT: 11.3 % (ref 3.0–12.0)
Monocytes Absolute: 0.8 10*3/uL (ref 0.1–1.0)
NEUTROS ABS: 3.7 10*3/uL (ref 1.4–7.7)
NEUTROS PCT: 51.1 % (ref 43.0–77.0)
Platelets: 217 10*3/uL (ref 150.0–400.0)
RBC: 5.17 Mil/uL (ref 4.22–5.81)
RDW: 13.9 % (ref 11.5–15.5)
WBC: 7.2 10*3/uL (ref 4.0–10.5)

## 2013-12-19 LAB — MICROALBUMIN / CREATININE URINE RATIO
Creatinine,U: 224.7 mg/dL
MICROALB/CREAT RATIO: 1.2 mg/g (ref 0.0–30.0)
Microalb, Ur: 2.6 mg/dL — ABNORMAL HIGH (ref 0.0–1.9)

## 2013-12-19 LAB — VITAMIN B12: Vitamin B-12: 449 pg/mL (ref 211–911)

## 2013-12-19 LAB — LIPID PANEL
CHOL/HDL RATIO: 4
Cholesterol: 152 mg/dL (ref 0–200)
HDL: 36.1 mg/dL — ABNORMAL LOW (ref >39.00)
LDL Cholesterol: 99 mg/dL (ref 0–99)
NONHDL: 115.9
Triglycerides: 84 mg/dL (ref 0.0–149.0)
VLDL: 16.8 mg/dL (ref 0.0–40.0)

## 2013-12-19 LAB — TSH: TSH: 3.18 u[IU]/mL (ref 0.35–4.50)

## 2013-12-19 LAB — MAGNESIUM: MAGNESIUM: 2 mg/dL (ref 1.5–2.5)

## 2013-12-19 NOTE — Addendum Note (Signed)
Addended by: Vernetta Honey on: 12/19/2013 11:49 AM   Modules accepted: Orders

## 2013-12-19 NOTE — Patient Instructions (Signed)
Labs today.  We will set up an ultrasound of your aorta.  Follow up in 4 weeks.

## 2013-12-19 NOTE — Assessment & Plan Note (Signed)
General medical exam normal today. Health maintenance is UTD. Encouraged continued healthy diet and regular exercise with walking. Will check CBC, CMP, lipids with labs today. PSA is followed by his urologist. Follow up here in 6 months and prn.

## 2013-12-19 NOTE — Progress Notes (Signed)
Pre visit review using our clinic review tool, if applicable. No additional management support is needed unless otherwise documented below in the visit note. 

## 2013-12-19 NOTE — Assessment & Plan Note (Signed)
Recent issues with muscle cramping in his legs bilaterally. Exam normal today. Will check electrolytes and B12 with labs. Will set up US aorta given smoking history and risk of AAA. Follow up in 4 weeks.

## 2013-12-19 NOTE — Progress Notes (Signed)
Subjective:    Patient ID: Timothy Turner, male    DOB: 01-Apr-1941, 73 y.o.   MRN: [id number]  HPI The patient is here for annual Medicare wellness examination and management of other chronic and acute problems.   The risk factors are reflected in the social history.  The roster of all physicians providing medical care to patient - is listed in the Snapshot section of the chart.  Activities of daily living:  The patient is 100% independent in all ADLs: dressing, toileting, feeding as well as independent mobility. Lives in 1.5 story home with wife. Has dog in home.  Home safety : The patient has smoke detectors in the home. They wear seatbelts.  There are firearms at home, which are locked. There is no violence in the home.   There is no risks for hepatitis, STDs or HIV. There is a history of blood transfusion in 2005 after colonoscopy. They have no travel history to infectious disease endemic areas of the world.  The patient has not seen their dentist in the last six month (has dentures).  They have seen their eye doctor in the last year. Recently diagnosed with cataracts. (Dr. Dawna Part).  They deny issues with hearing.  They have deferred audiologic testing in the last year.   Urologist - Dr. Jacqlyn Larsen Cardiology - Dr. Rockey Situ Followed for Barrett's esopagus. GI - Dr. Gustavo Lah  They do not  have excessive sun exposure. Discussed the need for sun protection: hats, long sleeves and use of sunscreen if there is significant sun exposure.   Diet: the importance of a healthy diet is discussed. They do have a healthy diet.  The benefits of regular aerobic exercise were discussed. He exercises by walking 60 min per day outside.   Depression screen: there are no signs or vegative symptoms of depression- irritability, change in appetite, anhedonia, sadness/tearfullness.  Cognitive assessment: the patient manages all their financial and personal affairs and is actively engaged. They could  relate day,date,year and events.  The following portions of the patient's history were reviewed and updated as appropriate: allergies, current medications, past family history, past medical history,  past surgical history, past social history  and problem list.  Visual acuity was not assessed per patient preference since he has regular follow up with ophthalmologist. Hearing and body mass index were assessed and reviewed.   During the course of the visit the patient was educated and counseled about appropriate screening and preventive services including : fall prevention , diabetes screening, nutrition counseling, colorectal cancer screening, and recommended immunizations.    Muscle cramps - Started about 6 months ago. Occurs during day and night. Most prominent in legs, thighs. No improvement with Clonazepam.  Review of Systems  Constitutional: Negative for fever, chills, activity change, appetite change, fatigue and unexpected weight change.  HENT: Negative for congestion and trouble swallowing.   Eyes: Negative for visual disturbance.  Respiratory: Negative for cough and shortness of breath.   Cardiovascular: Negative for chest pain, palpitations and leg swelling.  Gastrointestinal: Negative for nausea, abdominal pain, diarrhea, constipation, blood in stool and abdominal distention.  Genitourinary: Negative for dysuria, urgency and difficulty urinating.  Musculoskeletal: Positive for myalgias. Negative for arthralgias and gait problem.  Skin: Negative for color change and rash.  Hematological: Negative for adenopathy.  Psychiatric/Behavioral: Negative for sleep disturbance and dysphoric mood. The patient is not nervous/anxious.        Objective:    BP 158/74  Pulse 62  Temp(Src) 97.6 F (36.4  C) (Oral)  Ht 6' 0.5" (1.842 m)  Wt 177 lb 8 oz (80.513 kg)  BMI 23.73 kg/m2  SpO2 97% Physical Exam  Constitutional: He is oriented to person, place, and time. He appears well-developed  and well-nourished. No distress.  HENT:  Head: Normocephalic and atraumatic.  Right Ear: External ear normal.  Left Ear: External ear normal.  Nose: Nose normal.  Mouth/Throat: Oropharynx is clear and moist. No oropharyngeal exudate.  Eyes: Conjunctivae and EOM are normal. Pupils are equal, round, and reactive to light. Right eye exhibits no discharge. Left eye exhibits no discharge. No scleral icterus.  Neck: Normal range of motion. Neck supple. No tracheal deviation present. No thyromegaly present.  Cardiovascular: Normal rate, regular rhythm and normal heart sounds.  Exam reveals no gallop and no friction rub.   No murmur heard. Pulmonary/Chest: Effort normal and breath sounds normal. No respiratory distress. He has no wheezes. He has no rales. He exhibits no tenderness.  Abdominal: Soft. Bowel sounds are normal. He exhibits no distension and no mass. There is no tenderness. There is no rebound and no guarding.  Musculoskeletal: Normal range of motion. He exhibits no edema.  Lymphadenopathy:    He has no cervical adenopathy.  Neurological: He is alert and oriented to person, place, and time. No cranial nerve deficit. Coordination normal.  Skin: Skin is warm and dry. No rash noted. He is not diaphoretic. No erythema. No pallor.  Psychiatric: He has a normal mood and affect. His behavior is normal. Judgment and thought content normal.          Assessment & Plan:   Problem List Items Addressed This Visit     Unprioritized   Medicare annual wellness visit, subsequent - Primary     General medical exam normal today. Health maintenance is UTD. Encouraged continued healthy diet and regular exercise with walking. Will check CBC, CMP, lipids with labs today. PSA is followed by his urologist. Follow up here in 6 months and prn.      Relevant Orders      CBC with Differential      Lipid panel      Microalbumin / creatinine urine ratio   Muscle cramps     Recent issues with muscle  cramping in his legs bilaterally. Exam normal today. Will check electrolytes and B12 with labs. Will set up US aorta given smoking history and risk of AAA. Follow up in 4 weeks.    Relevant Orders      Comprehensive metabolic panel      TSH      B12      Magnesium      Ambulatory referral to Vascular Surgery       Return in about 6 months (around 06/20/2014) for Recheck.

## 2014-01-03 ENCOUNTER — Other Ambulatory Visit: Payer: Self-pay | Admitting: *Deleted

## 2014-01-03 ENCOUNTER — Telehealth: Payer: Self-pay | Admitting: *Deleted

## 2014-01-03 DIAGNOSIS — N289 Disorder of kidney and ureter, unspecified: Secondary | ICD-10-CM

## 2014-01-03 NOTE — Telephone Encounter (Signed)
BMP renal insufficiency

## 2014-01-03 NOTE — Telephone Encounter (Signed)
See note

## 2014-01-03 NOTE — Telephone Encounter (Signed)
Pt is coming in tomorrow what labs and dx?  

## 2014-01-04 ENCOUNTER — Other Ambulatory Visit (INDEPENDENT_AMBULATORY_CARE_PROVIDER_SITE_OTHER): Payer: Medicare Other

## 2014-01-04 DIAGNOSIS — N289 Disorder of kidney and ureter, unspecified: Secondary | ICD-10-CM

## 2014-01-04 LAB — BASIC METABOLIC PANEL
BUN: 11 mg/dL (ref 6–23)
CALCIUM: 9.1 mg/dL (ref 8.4–10.5)
CO2: 27 mEq/L (ref 19–32)
Chloride: 105 mEq/L (ref 96–112)
Creatinine, Ser: 1.3 mg/dL (ref 0.4–1.5)
GFR: 57.38 mL/min — ABNORMAL LOW (ref >60.00)
Glucose, Bld: 108 mg/dL — ABNORMAL HIGH (ref 70–99)
Potassium: 4.4 mEq/L (ref 3.5–5.1)
Sodium: 138 mEq/L (ref 135–145)

## 2014-01-22 ENCOUNTER — Ambulatory Visit (INDEPENDENT_AMBULATORY_CARE_PROVIDER_SITE_OTHER): Payer: Medicare Other | Admitting: Internal Medicine

## 2014-01-22 ENCOUNTER — Encounter: Payer: Self-pay | Admitting: Internal Medicine

## 2014-01-22 ENCOUNTER — Other Ambulatory Visit: Payer: Self-pay | Admitting: Internal Medicine

## 2014-01-22 VITALS — BP 120/80 | HR 63 | Temp 97.8°F | Ht 72.5 in | Wt 175.5 lb

## 2014-01-22 DIAGNOSIS — IMO0001 Reserved for inherently not codable concepts without codable children: Secondary | ICD-10-CM

## 2014-01-22 DIAGNOSIS — N289 Disorder of kidney and ureter, unspecified: Secondary | ICD-10-CM | POA: Insufficient documentation

## 2014-01-22 DIAGNOSIS — R252 Cramp and spasm: Secondary | ICD-10-CM

## 2014-01-22 DIAGNOSIS — M791 Myalgia, unspecified site: Secondary | ICD-10-CM

## 2014-01-22 MED ORDER — CLONAZEPAM 1 MG PO TABS
1.0000 mg | ORAL_TABLET | Freq: Three times a day (TID) | ORAL | Status: DC | PRN
Start: 2014-01-22 — End: 2014-04-24

## 2014-01-22 NOTE — Progress Notes (Signed)
Subjective:    Patient ID: Timothy Turner, male    DOB: 10/27/1940, 73 y.o.   MRN: [id number]  HPI 72YO male presents for follow up. At last visit, he complained of muscle cramping in his lower legs.  He reports that leg cramping has improved. He is trying to stay better hydrated. He continues to walk 1 hr daily. No new concerns today.   Review of Systems  Constitutional: Negative for fever, chills, activity change, appetite change, fatigue and unexpected weight change.  Eyes: Negative for visual disturbance.  Respiratory: Negative for cough and shortness of breath.   Cardiovascular: Negative for chest pain, palpitations and leg swelling.  Gastrointestinal: Negative for abdominal pain and abdominal distention.  Genitourinary: Negative for dysuria, urgency and difficulty urinating.  Musculoskeletal: Positive for myalgias. Negative for arthralgias and gait problem.  Skin: Negative for color change and rash.  Hematological: Negative for adenopathy.  Psychiatric/Behavioral: Negative for sleep disturbance and dysphoric mood. The patient is not nervous/anxious.        Objective:    BP 120/80  Pulse 63  Temp(Src) 97.8 F (36.6 C) (Oral)  Ht 6' 0.5" (1.842 m)  Wt 175 lb 8 oz (79.606 kg)  BMI 23.46 kg/m2  SpO2 95% Physical Exam  Constitutional: He is oriented to person, place, and time. He appears well-developed and well-nourished. No distress.  HENT:  Head: Normocephalic and atraumatic.  Right Ear: External ear normal.  Left Ear: External ear normal.  Nose: Nose normal.  Mouth/Throat: Oropharynx is clear and moist. No oropharyngeal exudate.  Eyes: Conjunctivae and EOM are normal. Pupils are equal, round, and reactive to light. Right eye exhibits no discharge. Left eye exhibits no discharge. No scleral icterus.  Neck: Normal range of motion. Neck supple. No tracheal deviation present. No thyromegaly present.  Cardiovascular: Normal rate, regular rhythm and normal heart  sounds.  Exam reveals no gallop and no friction rub.   No murmur heard. Pulmonary/Chest: Effort normal and breath sounds normal. No accessory muscle usage. Not tachypneic. No respiratory distress. He has no decreased breath sounds. He has no wheezes. He has no rhonchi. He has no rales. He exhibits no tenderness.  Musculoskeletal: Normal range of motion. He exhibits no edema.  Lymphadenopathy:    He has no cervical adenopathy.  Neurological: He is alert and oriented to person, place, and time. No cranial nerve deficit. Coordination normal.  Skin: Skin is warm and dry. No rash noted. He is not diaphoretic. No erythema. No pallor.  Psychiatric: He has a normal mood and affect. His behavior is normal. Judgment and thought content normal.          Assessment & Plan:   Problem List Items Addressed This Visit     Unprioritized   Muscle cramps - Primary     Symptoms improved with better hydration. Will continue to monitor.    Renal insufficiency     Reviewed renal function over last 2 years with pt. Slight decrease in GFR intermittently, likely related to hydration. Will plan to repeat kidney function with labs in 3 months. We discussed that medications can affect kidney function and we discussed Celebrex and kidney function, however will leave for now given significant improvement in chronic leg pain on this medication.     Other Visit Diagnoses   Muscle pain        Relevant Medications       clonazePAM (KLONOPIN)  tablet        Return in about 3 months (  around 04/24/2014).

## 2014-01-22 NOTE — Assessment & Plan Note (Signed)
Symptoms improved with better hydration. Will continue to monitor.

## 2014-01-22 NOTE — Assessment & Plan Note (Signed)
Reviewed renal function over last 2 years with pt. Slight decrease in GFR intermittently, likely related to hydration. Will plan to repeat kidney function with labs in 3 months. We discussed that medications can affect kidney function and we discussed Celebrex and kidney function, however will leave for now given significant improvement in chronic leg pain on this medication.

## 2014-01-22 NOTE — Patient Instructions (Signed)
Continue current medications    Follow up in 3 months

## 2014-01-22 NOTE — Progress Notes (Signed)
Pre visit review using our clinic review tool, if applicable. No additional management support is needed unless otherwise documented below in the visit note. 

## 2014-03-07 ENCOUNTER — Other Ambulatory Visit: Payer: Self-pay | Admitting: Internal Medicine

## 2014-04-11 ENCOUNTER — Other Ambulatory Visit: Payer: Self-pay | Admitting: Internal Medicine

## 2014-04-24 ENCOUNTER — Other Ambulatory Visit (INDEPENDENT_AMBULATORY_CARE_PROVIDER_SITE_OTHER): Payer: Medicare Other

## 2014-04-24 ENCOUNTER — Ambulatory Visit (INDEPENDENT_AMBULATORY_CARE_PROVIDER_SITE_OTHER): Payer: Medicare Other | Admitting: Internal Medicine

## 2014-04-24 ENCOUNTER — Encounter: Payer: Self-pay | Admitting: Internal Medicine

## 2014-04-24 VITALS — BP 138/80 | HR 58 | Temp 97.9°F | Ht 72.5 in | Wt 181.5 lb

## 2014-04-24 DIAGNOSIS — M791 Myalgia, unspecified site: Secondary | ICD-10-CM

## 2014-04-24 DIAGNOSIS — Z23 Encounter for immunization: Secondary | ICD-10-CM

## 2014-04-24 DIAGNOSIS — G894 Chronic pain syndrome: Secondary | ICD-10-CM

## 2014-04-24 DIAGNOSIS — N289 Disorder of kidney and ureter, unspecified: Secondary | ICD-10-CM

## 2014-04-24 DIAGNOSIS — K2270 Barrett's esophagus without dysplasia: Secondary | ICD-10-CM

## 2014-04-24 LAB — COMPREHENSIVE METABOLIC PANEL
ALBUMIN: 3.4 g/dL — AB (ref 3.5–5.2)
ALT: 16 U/L (ref 0–53)
AST: 20 U/L (ref 0–37)
Alkaline Phosphatase: 63 U/L (ref 39–117)
BUN: 14 mg/dL (ref 6–23)
CHLORIDE: 104 meq/L (ref 96–112)
CO2: 27 mEq/L (ref 19–32)
Calcium: 8.9 mg/dL (ref 8.4–10.5)
Creatinine, Ser: 1.3 mg/dL (ref 0.4–1.5)
GFR: 60.52 mL/min (ref >60.00)
Glucose, Bld: 99 mg/dL (ref 70–99)
POTASSIUM: 4.1 meq/L (ref 3.5–5.1)
Sodium: 137 mEq/L (ref 135–145)
TOTAL PROTEIN: 6.5 g/dL (ref 6.0–8.3)
Total Bilirubin: 0.6 mg/dL (ref 0.2–1.2)

## 2014-04-24 MED ORDER — LEVOTHYROXINE SODIUM 75 MCG PO TABS: ORAL_TABLET | ORAL | Status: DC

## 2014-04-24 MED ORDER — CLONAZEPAM 1 MG PO TABS: 1.0000 mg | ORAL_TABLET | Freq: Three times a day (TID) | ORAL | Status: DC | PRN

## 2014-04-24 NOTE — Assessment & Plan Note (Signed)
Repeat renal function ordered for today.

## 2014-04-24 NOTE — Assessment & Plan Note (Signed)
Reviewed previous endoscopy. He will follow up with GI as scheduled.

## 2014-04-24 NOTE — Assessment & Plan Note (Signed)
Symptoms of chronic muscle pain well controlled with Clonazepam. Will continue.

## 2014-04-24 NOTE — Progress Notes (Signed)
Subjective:    Patient ID: Timothy Turner, male    DOB: March 29, 1941, 73 y.o.   MRN: [id number]  HPI 73YO male presents for follow up.  [place] off a chair 2 weeks ago onto bottom. Had pain in tailbone. Applied heat with some improvement. Feels like pain is improving.  Generally, feeling well. Continues to follow with Dr. Jacqlyn Larsen and Dr. Gustavo Lah.  Pain has been well controlled with Clonazepam. Tried to come off medication earlier this year, but developed worsening pain.  Review of Systems  Constitutional: Negative for fever, chills, activity change, appetite change, fatigue and unexpected weight change.  Eyes: Negative for visual disturbance.  Respiratory: Negative for cough and shortness of breath.   Cardiovascular: Negative for chest pain, palpitations and leg swelling.  Gastrointestinal: Negative for nausea, abdominal pain, diarrhea, constipation and abdominal distention.  Genitourinary: Negative for dysuria, urgency and difficulty urinating.  Musculoskeletal: Positive for myalgias and arthralgias. Negative for gait problem.  Skin: Negative for color change and rash.  Hematological: Negative for adenopathy.  Psychiatric/Behavioral: Negative for sleep disturbance and dysphoric mood. The patient is not nervous/anxious.        Objective:    BP 138/80 mmHg  Pulse 58  Temp(Src) 97.9 F (36.6 C) (Oral)  Ht 6' 0.5" (1.842 m)  Wt 181 lb 8 oz (82.328 kg)  BMI 24.26 kg/m2  SpO2 95% Physical Exam  Constitutional: He is oriented to person, place, and time. He appears well-developed and well-nourished. No distress.  HENT:  Head: Normocephalic and atraumatic.  Right Ear: External ear normal.  Left Ear: External ear normal.  Nose: Nose normal.  Mouth/Throat: Oropharynx is clear and moist. No oropharyngeal exudate.  Eyes: Conjunctivae and EOM are normal. Pupils are equal, round, and reactive to light. Right eye exhibits no discharge. Left eye exhibits no discharge. No scleral  icterus.  Neck: Normal range of motion. Neck supple. No tracheal deviation present. No thyromegaly present.  Cardiovascular: Normal rate, regular rhythm and normal heart sounds.  Exam reveals no gallop and no friction rub.   No murmur heard. Pulmonary/Chest: Effort normal and breath sounds normal. No accessory muscle usage. No tachypnea. No respiratory distress. He has no decreased breath sounds. He has no wheezes. He has no rhonchi. He has no rales. He exhibits no tenderness.  Musculoskeletal: Normal range of motion. He exhibits no edema.  Lymphadenopathy:    He has no cervical adenopathy.  Neurological: He is alert and oriented to person, place, and time. No cranial nerve deficit. Coordination normal.  Skin: Skin is warm and dry. No rash noted. He is not diaphoretic. No erythema. No pallor.  Psychiatric: He has a normal mood and affect. His behavior is normal. Judgment and thought content normal.          Assessment & Plan:   Problem List Items Addressed This Visit      Unprioritized   Barrett's esophagus    Reviewed previous endoscopy. He will follow up with GI as scheduled.     Chronic pain syndrome - Primary    Symptoms of chronic muscle pain well controlled with Clonazepam. Will continue.    Relevant Medications      clonazePAM (KLONOPIN)  tablet   Other Relevant Orders      Comprehensive metabolic panel   Renal insufficiency    Repeat renal function ordered for today.     Other Visit Diagnoses    Muscle pain        Relevant Medications  clonazePAM (KLONOPIN)  tablet    Encounter for immunization            Return in about 6 months (around 10/23/2014) for Wellness Visit.

## 2014-04-24 NOTE — Patient Instructions (Signed)
Labs today.  Follow up in 6 months for Wellness Visit. 

## 2014-07-03 ENCOUNTER — Telehealth: Payer: Self-pay | Admitting: *Deleted

## 2014-07-03 NOTE — Telephone Encounter (Signed)
Fax from [employer]. Celebrex approved through 07/02/15.

## 2014-09-12 ENCOUNTER — Ambulatory Visit (INDEPENDENT_AMBULATORY_CARE_PROVIDER_SITE_OTHER): Payer: Medicare Other | Admitting: Cardiovascular Disease

## 2014-09-12 ENCOUNTER — Encounter: Payer: Self-pay | Admitting: Cardiovascular Disease

## 2014-09-12 VITALS — BP 132/64 | HR 53 | Ht 72.0 in | Wt 182.0 lb

## 2014-09-12 DIAGNOSIS — I739 Peripheral vascular disease, unspecified: Secondary | ICD-10-CM | POA: Diagnosis not present

## 2014-09-12 DIAGNOSIS — E785 Hyperlipidemia, unspecified: Secondary | ICD-10-CM

## 2014-09-12 DIAGNOSIS — R011 Cardiac murmur, unspecified: Secondary | ICD-10-CM

## 2014-09-12 DIAGNOSIS — I341 Nonrheumatic mitral (valve) prolapse: Secondary | ICD-10-CM | POA: Insufficient documentation

## 2014-09-12 DIAGNOSIS — R010 Benign and innocent cardiac murmurs: Secondary | ICD-10-CM | POA: Diagnosis not present

## 2014-09-12 DIAGNOSIS — I340 Nonrheumatic mitral (valve) insufficiency: Secondary | ICD-10-CM

## 2014-09-12 NOTE — Patient Instructions (Addendum)
You are doing well. No medication changes were made.  We will order a echocardiogram for severe mitral valve regurgitation, prolapse  Please call us if you have new issues that need to be addressed before your next appt.  Your physician wants you to follow-up in: 12 months.  You will receive a reminder letter in the mail two months in advance. If you don't receive a letter, please call our office to schedule the follow-up appointment.

## 2014-09-12 NOTE — Assessment & Plan Note (Signed)
Moderate mitral valve prolapse in 2009 per outside report Repeat echocardiogram ordered

## 2014-09-12 NOTE — Assessment & Plan Note (Signed)
Repeat echocardiogram has been ordered given the intensity of his murmur. Last echocardiogram [facility] 2009 This was done by outside facility, [facility], that documented moderate prolapse, moderate to severe MR, moderately dilated left atrium

## 2014-09-12 NOTE — Progress Notes (Signed)
Patient ID: Timothy Turner, male    DOB: September 08, 1940, 74 y.o.   MRN: [id number]  HPI Comments: Timothy Turner is a very pleasant 74 year-old gentleman with  30 years of smoking history, moderate bilateral iliac arterial disease in 2015, history of mitral valve prolapse and prior echocardiogram in 2009 reporting moderate to severe MR (outside study ), hyperlipidemia, hypertension with diffuse chronic muscle aches of uncertain etiology who presents for routine followup of his PAD, MR.  Infarct today, he reports that he is doing well. He continues to walk daily He walks approximately 45 minutes up to one hour per day.  He does have muscle ache in his legs at nighttime, also in his calves when he walks up hills. Denies any chest pain or shortness of breath with exertion. Denies having any lower extremity edema Blood pressure has been relatively stable He denies any significant near syncope or syncope. He has stopped Niaspan secondary to severe flushing He reports weight has been stable but he does like his desserts  EKG on today's visit [place] rhythm with rate of 53 beats per minute, no significant ST or T wave changes, first degree AV block     Allergies  Allergen Reactions  . Cymbalta [Duloxetine Hcl]     Unable to urinate  . Rosuvastatin   . Statins     Outpatient Encounter Prescriptions as of 09/12/2014  Medication Sig  . calcipotriene (DOVONEX) 0.005 % cream Apply topically.    . celecoxib (CELEBREX) 200 MG capsule TAKE 1 CAPSULE TWICE DAILY  . cetirizine (ZYRTEC) 10 MG tablet Take 10 mg by mouth daily.  . Cholecalciferol (VITAMIN D HIGH POTENCY) 1000 UNITS capsule Take 1,000 Units by mouth daily. Take one tablet by mouth two times a day.   . clobetasol (TEMOVATE) 0.05 % cream Apply topically 2 (two) times daily.    . clonazePAM (KLONOPIN) 1 MG tablet Take 1-2 tablets (1-2 mg total) by mouth 3 (three) times daily as needed (severe muscle spasm).  . colesevelam (WELCHOL) 625 MG  tablet Take 3 tablets (1,875 mg total) by mouth 2 (two) times daily with a meal.  . esomeprazole (NEXIUM) 40 MG capsule Take 40 mg by mouth daily before breakfast.    . finasteride (PROSCAR) 5 MG tablet Take 5 mg by mouth daily.    . Glucosamine-Chondroitin (OSTEO BI-FLEX REGULAR STRENGTH) 250-200 MG TABS Take by mouth. Take one tablet daily.   [name] levothyroxine (SYNTHROID, LEVOTHROID) 75 MCG tablet TAKE 1 TABLET DAILY  . loratadine (CLARITIN) 10 MG tablet Take 10 mg by mouth daily.    . Magnesium 250 MG TABS Take by mouth.  . metoprolol tartrate (LOPRESSOR) 25 MG tablet Take 25 mg by mouth 2 (two) times daily as needed. As needed for palpitations  . Multiple Vitamins-Minerals (CENTRUM SILVER PO) Take by mouth.    . Omega-3 Fatty Acids (FISH OIL) 1000 MG CAPS Take 1,000 mg by mouth. Take two tablets daily.   . Testosterone 30 MG/ACT SOLN Place onto the skin. [place]    Past Medical History  Diagnosis Date  . Heart murmur     History of  . Mitral valve disease   . Hypertension   . Hemorrhoids   . Psoriasis   . Thyroiditis   . Rheumatic heart valve incompetence   . Pain syndrome, chronic     Severe  . Myalgia     Multiple  . Prostate cancer   . Gallstones   . Barrett esophagus  Past Surgical History  Procedure Laterality Date  . Back surgery    . Hemorroidectomy    . Upper gi endoscopy  03-06-2013    Dr Donnella Sham  . Colonoscopy  03-08-12    Dr Donnella Sham  . Cholecystectomy  08/03/13    Social History  reports that he quit smoking about 10 years ago. He has never used smokeless tobacco. He reports that he does not drink alcohol or use illicit drugs.  Family History family history includes Aneurysm in his sister; Cancer in his sister; Heart failure in his father and mother.     Review of Systems  Constitutional: Negative.   Respiratory: Negative.   Cardiovascular: Negative.   Gastrointestinal: Negative.   Musculoskeletal: Positive for myalgias.  Skin: Negative.    Neurological: Negative.   Hematological: Negative.   Psychiatric/Behavioral: Negative.   All other systems reviewed and are negative.   BP 132/64 mmHg  Pulse 53  Ht 6' (1.829 m)  Wt 182 lb (82.555 kg)  BMI 24.68 kg/m2  Physical Exam  Constitutional: He is oriented to person, place, and time. He appears well-developed and well-nourished.  HENT:  Head: Normocephalic.  Nose: Nose normal.  Mouth/Throat: Oropharynx is clear and moist.  Eyes: Conjunctivae are normal. Pupils are equal, round, and reactive to light.  Neck: Normal range of motion. Neck supple. No JVD present.  Cardiovascular: Normal rate, regular rhythm, S1 normal, S2 normal and intact distal pulses.  Exam reveals no gallop and no friction rub.   Murmur heard.  Systolic murmur is present with a grade of 2/6  Pulmonary/Chest: Effort normal and breath sounds normal. No respiratory distress. He has no wheezes. He has no rales. He exhibits no tenderness.  Abdominal: Soft. Bowel sounds are normal. He exhibits no distension. There is no tenderness.  Musculoskeletal: Normal range of motion. He exhibits no edema or tenderness.  Lymphadenopathy:    He has no cervical adenopathy.  Neurological: He is alert and oriented to person, place, and time. Coordination normal.  Skin: Skin is warm and dry. No rash noted. No erythema.  Psychiatric: He has a normal mood and affect. His behavior is normal. Judgment and thought content normal.      Assessment and Plan   Nursing note and vitals reviewed.

## 2014-09-12 NOTE — Assessment & Plan Note (Addendum)
He is currently on WelChol. He did not tolerate Niaspan. We should consider adding zetia 10 mg perhaps later this year when it goes generic

## 2014-09-12 NOTE — Assessment & Plan Note (Signed)
Moderate bilateral common iliac arterial disease in 2015. Recommended he keep his weight low, Also discussed we may need to add zetia daily to his medication regiment to achieve LDL less than 70

## 2014-09-18 ENCOUNTER — Other Ambulatory Visit: Payer: Self-pay | Admitting: Cardiovascular Disease

## 2014-09-20 ENCOUNTER — Telehealth: Payer: Self-pay

## 2014-09-20 NOTE — Telephone Encounter (Signed)
The patient called and is hoping to have a letter to excuse him from jury duty due to his back spasms.   Callback - [phone]

## 2014-09-21 NOTE — Telephone Encounter (Signed)
Patient came in the office to discuss this letter.  J.Eastwood spoke with pt face to face.

## 2014-09-21 NOTE — Telephone Encounter (Signed)
Advised pt that you were out of the office and we would let him know Monday if we could have a letter for him.  Enounter shouldn't have been closed.  I will create a note for him

## 2014-09-21 NOTE — Telephone Encounter (Signed)
Please see below request  

## 2014-09-21 NOTE — Telephone Encounter (Signed)
We can write him a letter if he needs it, but I see this issue was closed. Just let me know.

## 2014-09-23 NOTE — Telephone Encounter (Signed)
Fine to write letter.

## 2014-09-24 ENCOUNTER — Encounter: Payer: Self-pay | Admitting: *Deleted

## 2014-09-24 ENCOUNTER — Other Ambulatory Visit: Payer: Self-pay | Admitting: *Deleted

## 2014-09-24 DIAGNOSIS — M791 Myalgia, unspecified site: Secondary | ICD-10-CM

## 2014-09-24 MED ORDER — CLONAZEPAM 1 MG PO TABS: 1.0000 mg | ORAL_TABLET | Freq: Three times a day (TID) | ORAL | Status: DC | PRN

## 2014-09-24 NOTE — Telephone Encounter (Signed)
Letter completed and ready for pick up, Notified pt

## 2014-09-24 NOTE — Telephone Encounter (Signed)
Last visit 04/24/14, refill?

## 2014-09-25 ENCOUNTER — Other Ambulatory Visit: Payer: Self-pay | Admitting: Internal Medicine

## 2014-09-25 NOTE — Telephone Encounter (Signed)
Last visit 07/04/13

## 2014-10-11 ENCOUNTER — Ambulatory Visit (INDEPENDENT_AMBULATORY_CARE_PROVIDER_SITE_OTHER): Payer: Medicare Other

## 2014-10-11 ENCOUNTER — Other Ambulatory Visit: Payer: Self-pay

## 2014-10-11 DIAGNOSIS — I340 Nonrheumatic mitral (valve) insufficiency: Secondary | ICD-10-CM | POA: Diagnosis not present

## 2014-10-11 DIAGNOSIS — R010 Benign and innocent cardiac murmurs: Secondary | ICD-10-CM

## 2014-10-11 DIAGNOSIS — R011 Cardiac murmur, unspecified: Secondary | ICD-10-CM

## 2014-10-13 NOTE — Op Note (Signed)
PATIENT NAME:  Timothy Turner, DISNEY MR#:  [id number] DATE OF BIRTH:  [birth year]  DATE OF OPERATION:  08/03/2013  PREOPERATIVE DIAGNOSIS: Chronic cholecystitis and cholelithiasis.   POSTOPERATIVE DIAGNOSIS:  Chronic cholecystitis and cholelithiasis.   PROCEDURE: Laparoscopic cholecystectomy with  intraoperative cholangiograms.   SURGEON: Hervey Ard, MD  ANESTHESIA: General endotracheal under Dr. Julaine Hua BLOOD LOSS: Less than 5 mL  CLINICAL NOTE: This 74 year old male has had episodic upper abdominal and chest pain. Ultrasound showed evidence of multiple stones. Clinical history is compatible with chronic cholecystitis, and he was offered elective cholecystectomy.   DESCRIPTION OF PROCEDURE:  The patient underwent general endotracheal anesthesia, and tolerated this with transient hypotension responsive to ephedrine. The abdomen was prepped with ChloraPrep and draped. In Trendelenburg position, a Veress needle was placed through a transumbilical incision. After assuring intra-abdominal location with the hanging drop test, the abdomen was insufflated with CO2 at 12 mmHg pressure. A 10 mm step port was expanded, and inspection showed no evidence of injury from initial port placement. The patient was placed in reverse Trendelenburg position and rolled to the left. An 11 mm Xcel port was placed in the epigastrium. Two 5 mm step ports were placed laterally. Significant adhesions of the omentum to the fundus of the gallbladder was identified, and this was taken down with cautery dissection. The neck of the gallbladder was cleared and fluoroscopic cholangiograms completed using a total of 32 mL of 1/2 strength Conray 60. The cystic duct was prominent, and the common bile duct and common hepatic duct were fully opacified, and free flow into the duodenum was noted. There was initially question of a possible bubble versus stone, and this resolved with the rotation of the patient to the right and  refilling of the ductal structures. Normal tapering of the distal duct was seen. The cystic duct and branches of the cystic artery were doubly clipped and divided. The gallbladder was then removed from the liver bed, making use of hook cautery dissection. Multiple stones were encountered, and it was necessary to expand the umbilical incision to extract these. After the gallbladder had been removed, the 10 mm port was replaced and pneumoperitoneum reestablished. The lower abdomen was examined from the epigastric site, and no evidence of injury from initial port placement was seen. The right upper quadrant was irrigated with lactated Ringer's solution, and good hemostasis was noted. Clips were appropriately placed. The abdomen was then desufflated and ports removed under direct vision. The fascia at the umbilicus was approximated with an 0 PDS figure-of-eight suture. Skin incisions were closed with 4-0 Vicryl subcuticular sutures. Benzoin, Steri-Strips, Telfa, and Tegaderm dressing was then applied.   The patient tolerated the procedure well, and was taken to the recovery room in stable condition.     ____________________________ [name], MD jwb:mr D: 08/03/2013 17:56:22 ET T: 08/03/2013 20:43:11 ET JOB#: [id number]  cc: [name], MD, <Dictator> Jaylon Grode Amedeo Kinsman MD ELECTRONICALLY SIGNED 08/03/2013 21:59

## 2014-10-17 ENCOUNTER — Telehealth: Payer: Self-pay | Admitting: *Deleted

## 2014-10-17 NOTE — Telephone Encounter (Signed)
Pt calling about echo results.  Please advise.

## 2014-10-17 NOTE — Telephone Encounter (Signed)
Reviewed prelim ECHO results w/ pt.

## 2014-11-12 ENCOUNTER — Encounter: Payer: Self-pay | Admitting: Internal Medicine

## 2014-11-12 ENCOUNTER — Ambulatory Visit (INDEPENDENT_AMBULATORY_CARE_PROVIDER_SITE_OTHER): Payer: Medicare Other | Admitting: Internal Medicine

## 2014-11-12 VITALS — BP 168/87 | HR 55 | Temp 97.6°F | Ht 72.0 in | Wt 184.2 lb

## 2014-11-12 DIAGNOSIS — R1310 Dysphagia, unspecified: Secondary | ICD-10-CM | POA: Insufficient documentation

## 2014-11-12 DIAGNOSIS — K2270 Barrett's esophagus without dysplasia: Secondary | ICD-10-CM

## 2014-11-12 DIAGNOSIS — E785 Hyperlipidemia, unspecified: Secondary | ICD-10-CM

## 2014-11-12 DIAGNOSIS — Z0000 Encounter for general adult medical examination without abnormal findings: Secondary | ICD-10-CM

## 2014-11-12 DIAGNOSIS — F4321 Adjustment disorder with depressed mood: Secondary | ICD-10-CM | POA: Diagnosis not present

## 2014-11-12 LAB — CBC WITH DIFFERENTIAL/PLATELET
Basophils Absolute: 0.1 10*3/uL (ref 0.0–0.1)
Basophils Relative: 0.8 % (ref 0.0–3.0)
EOS ABS: 0.2 10*3/uL (ref 0.0–0.7)
EOS PCT: 2 % (ref 0.0–5.0)
HCT: 46.7 % (ref 39.0–52.0)
Hemoglobin: 15.6 g/dL (ref 13.0–17.0)
LYMPHS PCT: 39.8 % (ref 12.0–46.0)
Lymphs Abs: 3.2 10*3/uL (ref 0.7–4.0)
MCHC: 33.5 g/dL (ref 30.0–36.0)
MCV: 87.4 fl (ref 78.0–100.0)
MONO ABS: 0.9 10*3/uL (ref 0.1–1.0)
MONOS PCT: 11.1 % (ref 3.0–12.0)
Neutro Abs: 3.8 10*3/uL (ref 1.4–7.7)
Neutrophils Relative %: 46.3 % (ref 43.0–77.0)
Platelets: 205 10*3/uL (ref 150.0–400.0)
RBC: 5.34 Mil/uL (ref 4.22–5.81)
RDW: 13.8 % (ref 11.5–15.5)
WBC: 8.1 10*3/uL (ref 4.0–10.5)

## 2014-11-12 LAB — LIPID PANEL
CHOLESTEROL: 181 mg/dL (ref 0–200)
HDL: 35.2 mg/dL — ABNORMAL LOW (ref >39.00)
LDL CALC: 123 mg/dL — AB (ref 0–99)
NONHDL: 145.8
Total CHOL/HDL Ratio: 5
Triglycerides: 113 mg/dL (ref 0.0–149.0)
VLDL: 22.6 mg/dL (ref 0.0–40.0)

## 2014-11-12 LAB — COMPREHENSIVE METABOLIC PANEL
ALBUMIN: 4.3 g/dL (ref 3.5–5.2)
ALT: 18 U/L (ref 0–53)
AST: 24 U/L (ref 0–37)
Alkaline Phosphatase: 63 U/L (ref 39–117)
BUN: 19 mg/dL (ref 6–23)
CALCIUM: 9.4 mg/dL (ref 8.4–10.5)
CO2: 27 mEq/L (ref 19–32)
Chloride: 105 mEq/L (ref 96–112)
Creatinine, Ser: 1.25 mg/dL (ref 0.40–1.50)
GFR: 60.42 mL/min (ref >60.00)
Glucose, Bld: 103 mg/dL — ABNORMAL HIGH (ref 70–99)
Potassium: 4.8 mEq/L (ref 3.5–5.1)
Sodium: 137 mEq/L (ref 135–145)
TOTAL PROTEIN: 6.8 g/dL (ref 6.0–8.3)
Total Bilirubin: 0.7 mg/dL (ref 0.2–1.2)

## 2014-11-12 LAB — MICROALBUMIN / CREATININE URINE RATIO
CREATININE, U: 58.4 mg/dL
MICROALB/CREAT RATIO: 1.7 mg/g (ref 0.0–30.0)
Microalb, Ur: 1 mg/dL (ref 0.0–1.9)

## 2014-11-12 MED ORDER — ESCITALOPRAM OXALATE 5 MG PO TABS
5.0000 mg | ORAL_TABLET | Freq: Every day | ORAL | Status: DC
Start: 2014-11-12 — End: 2014-12-07

## 2014-11-12 MED ORDER — EZETIMIBE 10 MG PO TABS: 10.0000 mg | ORAL_TABLET | Freq: Every day | ORAL | Status: DC

## 2014-11-12 NOTE — Progress Notes (Signed)
Pre visit review using our clinic review tool, if applicable. No additional management support is needed unless otherwise documented below in the visit note. 

## 2014-11-12 NOTE — Patient Instructions (Signed)
Start Lexapro 5mg  daily.  Follow up in 3 weeks.  Health Maintenance A healthy lifestyle and preventative care can promote health and wellness.  Maintain regular health, dental, and eye exams.  Eat a healthy diet. Foods like vegetables, fruits, whole grains, low-fat dairy products, and lean protein foods contain the nutrients you need and are low in calories. Decrease your intake of foods high in solid fats, added sugars, and salt. Get information about a proper diet from your health care provider, if necessary.  Regular physical exercise is one of the most important things you can do for your health. Most adults should get at least 150 minutes of moderate-intensity exercise (any activity that increases your heart rate and causes you to sweat) each week. In addition, most adults need muscle-strengthening exercises on 2 or more days a week.   Maintain a healthy weight. The body mass index (BMI) is a screening tool to identify possible weight problems. It provides an estimate of body fat based on height and weight. Your health care provider can find your BMI and can help you achieve or maintain a healthy weight. For males 20 years and older:  A BMI below 18.5 is considered underweight.  A BMI of 18.5 to 24.9 is normal.  A BMI of 25 to 29.9 is considered overweight.  A BMI of 30 and above is considered obese.  Maintain normal blood lipids and cholesterol by exercising and minimizing your intake of saturated fat. Eat a balanced diet with plenty of fruits and vegetables. Blood tests for lipids and cholesterol should begin at age 40 and be repeated every 5 years. If your lipid or cholesterol levels are high, you are over age 40, or you are at high risk for heart disease, you may need your cholesterol levels checked more frequently.Ongoing high lipid and cholesterol levels should be treated with medicines if diet and exercise are not working.  If you smoke, find out from your health care provider  how to quit. If you do not use tobacco, do not start.  Lung cancer screening is recommended for adults aged 41-80 years who are at high risk for developing lung cancer because of a history of smoking. A yearly low-dose CT scan of the lungs is recommended for people who have at least a 30-pack-year history of smoking and are current smokers or have quit within the past 15 years. A pack year of smoking is smoking an average of 1 pack of cigarettes a day for 1 year (for example, a 30-pack-year history of smoking could mean smoking 1 pack a day for 30 years or 2 packs a day for 15 years). Yearly screening should continue until the smoker has stopped smoking for at least 15 years. Yearly screening should be stopped for people who develop a health problem that would prevent them from having lung cancer treatment.  If you choose to drink alcohol, do not have more than 2 drinks per day. One drink is considered to be 12 oz (360 mL) of beer, 5 oz (150 mL) of wine, or 1.5 oz (45 mL) of liquor.  Avoid the use of street drugs. Do not share needles with anyone. Ask for help if you need support or instructions about stopping the use of drugs.  High blood pressure causes heart disease and increases the risk of stroke. Blood pressure should be checked at least every 1-2 years. Ongoing high blood pressure should be treated with medicines if weight loss and exercise are not effective.  If  you are 87-41 years old, ask your health care provider if you should take aspirin to prevent heart disease.  Diabetes screening involves taking a blood sample to check your fasting blood sugar level. This should be done once every 3 years after age 5 if you are at a normal weight and without risk factors for diabetes. Testing should be considered at a younger age or be carried out more frequently if you are overweight and have at least 1 risk factor for diabetes.  Colorectal cancer can be detected and often prevented. Most routine  colorectal cancer screening begins at the age of 64 and continues through age 48. However, your health care provider may recommend screening at an earlier age if you have risk factors for colon cancer. On a yearly basis, your health care provider may provide home test kits to check for hidden blood in the stool. A small camera at the end of a tube may be used to directly examine the colon (sigmoidoscopy or colonoscopy) to detect the earliest forms of colorectal cancer. Talk to your health care provider about this at age 4 when routine screening begins. A direct exam of the colon should be repeated every 5-10 years through age 54, unless early forms of precancerous polyps or small growths are found.  People who are at an increased risk for hepatitis B should be screened for this virus. You are considered at high risk for hepatitis B if:  You were born in a country where hepatitis B occurs often. Talk with your health care provider about which countries are considered high risk.  Your parents were born in a high-risk country and you have not received a shot to protect against hepatitis B (hepatitis B vaccine).  You have HIV or AIDS.  You use needles to inject street drugs.  You live with, or have sex with, someone who has hepatitis B.  You are a man who has sex with other men (MSM).  You get hemodialysis treatment.  You take certain medicines for conditions like cancer, organ transplantation, and autoimmune conditions.  Hepatitis C blood testing is recommended for all people born from [birth year] through 1963 and any individual with known risk factors for hepatitis C.  Healthy men should no longer receive prostate-specific antigen (PSA) blood tests as part of routine cancer screening. Talk to your health care provider about prostate cancer screening.  Testicular cancer screening is not recommended for adolescents or adult males who have no symptoms. Screening includes self-exam, a health care  provider exam, and other screening tests. Consult with your health care provider about any symptoms you have or any concerns you have about testicular cancer.  Practice safe sex. Use condoms and avoid high-risk sexual practices to reduce the spread of sexually transmitted infections (STIs).  You should be screened for STIs, including gonorrhea and chlamydia if:  You are sexually active and are younger than 24 years.  You are older than 24 years, and your health care provider tells you that you are at risk for this type of infection.  Your sexual activity has changed since you were last screened, and you are at an increased risk for chlamydia or gonorrhea. Ask your health care provider if you are at risk.  If you are at risk of being infected with HIV, it is recommended that you take a prescription medicine daily to prevent HIV infection. This is called pre-exposure prophylaxis (PrEP). You are considered at risk if:  You are a man who has sex  with other men (MSM).  You are a heterosexual man who is sexually active with multiple partners.  You take drugs by injection.  You are sexually active with a partner who has HIV.  Talk with your health care provider about whether you are at high risk of being infected with HIV. If you choose to begin PrEP, you should first be tested for HIV. You should then be tested every 3 months for as long as you are taking PrEP.  Use sunscreen. Apply sunscreen liberally and repeatedly throughout the day. You should seek shade when your shadow is shorter than you. Protect yourself by wearing long sleeves, pants, a wide-brimmed hat, and sunglasses year round whenever you are outdoors.  Tell your health care provider of new moles or changes in moles, especially if there is a change in shape or color. Also, tell your health care provider if a mole is larger than the size of a pencil eraser.  A one-time screening for abdominal aortic aneurysm (AAA) and surgical  repair of large AAAs by ultrasound is recommended for men aged 7-75 years who are current or former smokers.  Stay current with your vaccines (immunizations). Document Released: 12/05/2007 Document Revised: 06/13/2013 Document Reviewed: 11/03/2010 [facility] Patient Information 2015 [place], [place]. This information is not intended to replace advice given to you by your health care provider. Make sure you discuss any questions you have with your health care provider.

## 2014-11-12 NOTE — Assessment & Plan Note (Signed)
Referral placed for EGD. Last 02/2013.

## 2014-11-12 NOTE — Assessment & Plan Note (Signed)
Reviewed notes from Dr. Rockey Situ. Will start Zetia. Plan to repeat LFTs and lipids in 3 months.

## 2014-11-12 NOTE — Assessment & Plan Note (Signed)
General medical exam normal today except as noted. Referral placed for follow up endoscopy. Immunizations are UTD. Labs as ordered. Continued follow up with Dr. Jacqlyn Larsen. Encouraged continued healthy diet and exercise.

## 2014-11-12 NOTE — Progress Notes (Signed)
Subjective:    Patient ID: Timothy Turner, male    DOB: 1940-12-03, 74 y.o.   MRN: [id number]  HPI  The patient is here for annual Medicare wellness examination and management of other chronic and acute problems.   The risk factors are reflected in the social history.  The roster of all physicians providing medical care to patient - is listed in the Snapshot section of the chart.  Activities of daily living:  The patient is 100% independent in all ADLs: dressing, toileting, feeding as well as independent mobility. Lives in 1.5 story home with wife. Has dog in home.  Home safety : The patient has smoke detectors in the home. They wear seatbelts.  There are firearms at home, which are locked. There is no violence in the home.   There is no risks for hepatitis, STDs or HIV. There is a history of blood transfusion in 2005 after colonoscopy. They have no travel history to infectious disease endemic areas of the world.  The patient has not seen their dentist in the last six month (has dentures).  They have seen their eye doctor in the last year. Recently diagnosed with cataracts. (Dr. Dawna Part).  They deny issues with hearing.  They have deferred audiologic testing in the last year.   Urologist - Dr. Jacqlyn Larsen Cardiology - Dr. Rockey Situ Followed for Barrett's esopagus. GI - Dr. Gustavo Lah  They do not  have excessive sun exposure. Discussed the need for sun protection: hats, long sleeves and use of sunscreen if there is significant sun exposure.   Diet: the importance of a healthy diet is discussed. They do have a healthy diet.  The benefits of regular aerobic exercise were discussed. He exercises by walking 60 min per day outside. Has slowed down some, walking 5 miles per hour though.  Depression screen: PHQ9 = 23. Difficult situation with his wife and son. He notes apathy about interacting with friends. Has been walking to deal with stress. He has thought of suicide but denies current plan and  states he would not hurt himself.  Cognitive assessment: the patient manages all their financial and personal affairs and is actively engaged. They could relate day,date,year and events.  The following portions of the patient's history were reviewed and updated as appropriate: allergies, current medications, past family history, past medical history,  past surgical history, past social history  and problem list.  Visual acuity was not assessed per patient preference since he has regular follow up with ophthalmologist. Hearing and body mass index were assessed and reviewed.   During the course of the visit the patient was educated and counseled about appropriate screening and preventive services including : fall prevention , diabetes screening, nutrition counseling, colorectal cancer screening, and recommended immunizations.       Past medical, surgical, family and social history per today's encounter.  Review of Systems  Constitutional: Negative for fever, chills, activity change, appetite change, fatigue and unexpected weight change.  Eyes: Negative for visual disturbance.  Respiratory: Negative for cough and shortness of breath.   Cardiovascular: Negative for chest pain, palpitations and leg swelling.  Gastrointestinal: Negative for nausea, vomiting, abdominal pain, diarrhea, constipation and abdominal distention.  Genitourinary: Negative for dysuria, urgency and difficulty urinating.  Musculoskeletal: Negative for arthralgias and gait problem.  Skin: Negative for color change and rash.  Hematological: Negative for adenopathy.  Psychiatric/Behavioral: Positive for suicidal ideas and dysphoric mood. Negative for sleep disturbance. The patient is not nervous/anxious.  Objective:    BP 168/87 mmHg  Pulse 55  Temp(Src) 97.6 F (36.4 C) (Oral)  Ht 6' (1.829 m)  Wt 184 lb 4 oz (83.575 kg)  BMI 24.98 kg/m2  SpO2 96% Physical Exam  Constitutional: He is oriented to person,  place, and time. He appears well-developed and well-nourished. No distress.  HENT:  Head: Normocephalic and atraumatic.  Right Ear: External ear normal.  Left Ear: External ear normal.  Nose: Nose normal.  Mouth/Throat: Oropharynx is clear and moist. No oropharyngeal exudate.  Eyes: Conjunctivae and EOM are normal. Pupils are equal, round, and reactive to light. Right eye exhibits no discharge. Left eye exhibits no discharge. No scleral icterus.  Neck: Normal range of motion. Neck supple. No tracheal deviation present. No thyromegaly present.  Cardiovascular: Normal rate, regular rhythm and normal heart sounds.  Exam reveals no gallop and no friction rub.   No murmur heard. Pulmonary/Chest: Effort normal and breath sounds normal. No accessory muscle usage. No tachypnea. No respiratory distress. He has no decreased breath sounds. He has no wheezes. He has no rhonchi. He has no rales. He exhibits no tenderness.  Abdominal: Soft. Bowel sounds are normal. He exhibits no distension and no mass. There is no tenderness. There is no rebound and no guarding.  Musculoskeletal: Normal range of motion. He exhibits no edema.  Lymphadenopathy:    He has no cervical adenopathy.  Neurological: He is alert and oriented to person, place, and time. No cranial nerve deficit. Coordination normal.  Skin: Skin is warm and dry. No rash noted. He is not diaphoretic. No erythema. No pallor.  Psychiatric: His speech is normal and behavior is normal. Judgment and thought content normal. Cognition and memory are normal. He exhibits a depressed mood. He expresses no suicidal ideation. He expresses no suicidal plans.          Assessment & Plan:   Problem List Items Addressed This Visit      Unprioritized   Adjustment disorder with depressed mood    PHQ9=23. Offered support today. Will start Lexapro 5mg  daily. Discussed counseling, but he would like to hold off on this. Follow up in 3 weeks and prn. He will contract  for safety.      Relevant Medications   escitalopram (LEXAPRO) 5 MG tablet   Barrett esophagus    Referral placed for EGD. Last 02/2013.      Relevant Orders   Ambulatory referral to Gastroenterology   Hyperlipidemia    Reviewed notes from Dr. Rockey Situ. Will start Zetia. Plan to repeat LFTs and lipids in 3 months.      Relevant Medications   ezetimibe (ZETIA) 10 MG tablet   Medicare annual wellness visit, subsequent - Primary    General medical exam normal today except as noted. Referral placed for follow up endoscopy. Immunizations are UTD. Labs as ordered. Continued follow up with Dr. Jacqlyn Larsen. Encouraged continued healthy diet and exercise.      Relevant Orders   CBC with Differential/Platelet   Comprehensive metabolic panel   Lipid panel   Microalbumin / creatinine urine ratio       Return in about 3 weeks (around 12/03/2014).

## 2014-11-12 NOTE — Assessment & Plan Note (Signed)
PHQ9=23. Offered support today. Will start Lexapro 5mg  daily. Discussed counseling, but he would like to hold off on this. Follow up in 3 weeks and prn. He will contract for safety.

## 2014-11-13 NOTE — Telephone Encounter (Signed)
This encounter was created in error - please disregard.

## 2014-12-06 ENCOUNTER — Other Ambulatory Visit: Payer: Self-pay | Admitting: *Deleted

## 2014-12-06 MED ORDER — CELECOXIB 200 MG PO CAPS: 200.0000 mg | ORAL_CAPSULE | Freq: Two times a day (BID) | ORAL | Status: DC

## 2014-12-06 NOTE — Telephone Encounter (Signed)
Last visit 11/12/14

## 2014-12-07 ENCOUNTER — Other Ambulatory Visit: Payer: Self-pay

## 2014-12-07 DIAGNOSIS — F4321 Adjustment disorder with depressed mood: Secondary | ICD-10-CM

## 2014-12-07 MED ORDER — ESCITALOPRAM OXALATE 5 MG PO TABS: 5.0000 mg | ORAL_TABLET | Freq: Every day | ORAL | Status: DC

## 2014-12-12 ENCOUNTER — Other Ambulatory Visit: Payer: Self-pay | Admitting: *Deleted

## 2014-12-12 MED ORDER — EZETIMIBE 10 MG PO TABS: 10.0000 mg | ORAL_TABLET | Freq: Every day | ORAL | Status: DC

## 2014-12-17 ENCOUNTER — Ambulatory Visit (INDEPENDENT_AMBULATORY_CARE_PROVIDER_SITE_OTHER): Payer: Medicare Other | Admitting: Internal Medicine

## 2014-12-17 ENCOUNTER — Encounter: Payer: Self-pay | Admitting: Internal Medicine

## 2014-12-17 VITALS — BP 119/70 | HR 51 | Temp 97.8°F | Ht 72.0 in | Wt 180.5 lb

## 2014-12-17 DIAGNOSIS — M791 Myalgia, unspecified site: Secondary | ICD-10-CM | POA: Insufficient documentation

## 2014-12-17 DIAGNOSIS — E785 Hyperlipidemia, unspecified: Secondary | ICD-10-CM | POA: Diagnosis not present

## 2014-12-17 DIAGNOSIS — F4321 Adjustment disorder with depressed mood: Secondary | ICD-10-CM | POA: Diagnosis not present

## 2014-12-17 MED ORDER — CLONAZEPAM 1 MG PO TABS: 1.0000 mg | ORAL_TABLET | Freq: Three times a day (TID) | ORAL | Status: DC | PRN

## 2014-12-17 MED ORDER — ESCITALOPRAM OXALATE 10 MG PO TABS: 10.0000 mg | ORAL_TABLET | Freq: Every day | ORAL | Status: DC

## 2014-12-17 NOTE — Patient Instructions (Signed)
Increase Lexapro to 10mg  daily.  Follow up in 4 weeks.

## 2014-12-17 NOTE — Progress Notes (Signed)
Pre visit review using our clinic review tool, if applicable. No additional management support is needed unless otherwise documented below in the visit note. 

## 2014-12-17 NOTE — Assessment & Plan Note (Signed)
Insurance has refused to approve Zetia. Will submit PA.

## 2014-12-17 NOTE — Assessment & Plan Note (Signed)
No improvement with Lexapro. PHQ9 25. Will increase dose to 10mg  daily. Follow up in 4 weeks and prn.

## 2014-12-17 NOTE — Progress Notes (Addendum)
Subjective:    Patient ID: Timothy Turner, male    DOB: Oct 24, 1940, 74 y.o.   MRN: [id number]  HPI  [name] presents for followup.  Last visit, started on Lexapro to help with Depression. PHQ9 at that time 23. No real improvement noted with use of Lexapro. However, notes that he hurt his back after last visit, and has been struggling with some low back pain. This is improving. Not currently taking anything for this.  He notes some bilateral thigh pain with walking. Discussed with his cardiologist and had LE dopplers in the past which showed extensive atherosclerosis. Started on Zetia, however insurance would not approve.  Past medical, surgical, family and social history per today's encounter.  Review of Systems  Constitutional: Negative for fever, chills, activity change, appetite change, fatigue and unexpected weight change.  Eyes: Negative for visual disturbance.  Respiratory: Negative for cough and shortness of breath.   Cardiovascular: Negative for chest pain, palpitations and leg swelling.  Gastrointestinal: Negative for abdominal pain and abdominal distention.  Genitourinary: Negative for dysuria, urgency and difficulty urinating.  Musculoskeletal: Positive for myalgias and back pain. Negative for arthralgias and gait problem.  Skin: Negative for color change and rash.  Hematological: Negative for adenopathy.  Psychiatric/Behavioral: Positive for dysphoric mood. Negative for suicidal ideas and sleep disturbance. The patient is not nervous/anxious.        Objective:    BP 119/70 mmHg  Pulse 51  Temp(Src) 97.8 F (36.6 C) (Oral)  Ht 6' (1.829 m)  Wt 180 lb 8 oz (81.874 kg)  BMI 24.47 kg/m2  SpO2 97% Physical Exam  Constitutional: He is oriented to person, place, and time. He appears well-developed and well-nourished. No distress.  HENT:  Head: Normocephalic and atraumatic.  Right Ear: External ear normal.  Left Ear: External ear normal.  Nose: Nose normal.   Mouth/Throat: Oropharynx is clear and moist. No oropharyngeal exudate.  Eyes: Conjunctivae and EOM are normal. Pupils are equal, round, and reactive to light. Right eye exhibits no discharge. Left eye exhibits no discharge. No scleral icterus.  Neck: Normal range of motion. Neck supple. No tracheal deviation present. No thyromegaly present.  Cardiovascular: Normal rate, regular rhythm and normal heart sounds.  Exam reveals no gallop and no friction rub.   No murmur heard. Pulmonary/Chest: Effort normal and breath sounds normal. No accessory muscle usage. No tachypnea. No respiratory distress. He has no decreased breath sounds. He has no wheezes. He has no rhonchi. He has no rales. He exhibits no tenderness.  Musculoskeletal: Normal range of motion. He exhibits no edema.  Lymphadenopathy:    He has no cervical adenopathy.  Neurological: He is alert and oriented to person, place, and time. No cranial nerve deficit. Coordination normal.  Skin: Skin is warm and dry. No rash noted. He is not diaphoretic. No erythema. No pallor.  Psychiatric: His speech is normal and behavior is normal. Judgment and thought content normal. Cognition and memory are normal. He exhibits a depressed mood. He expresses no suicidal ideation.          Assessment & Plan:   Problem List Items Addressed This Visit      Unprioritized   Adjustment disorder with depressed mood - Primary    No improvement with Lexapro. PHQ9 25. Will increase dose to 10mg  daily. Follow up in 4 weeks and prn.      Relevant Medications   escitalopram (LEXAPRO) 10 MG tablet   Hyperlipidemia    Insurance has refused  to approve Zetia. Will submit PA.      Muscle pain    Muscle pain in thighs bilaterally with walking. Symptoms c/w claudication. Followed by cardiology. Recently started on Zetia, however not approved by insurance. Will continue to monitor for now. Consider follow up vascular evaluation if symptoms persistent.      Relevant  Medications   clonazePAM (KLONOPIN) 1 MG tablet       Return in about 4 weeks (around 01/14/2015) for Recheck.

## 2014-12-17 NOTE — Assessment & Plan Note (Signed)
Muscle pain in thighs bilaterally with walking. Symptoms c/w claudication. Followed by cardiology. Recently started on Zetia, however not approved by insurance. Will continue to monitor for now. Consider follow up vascular evaluation if symptoms persistent.

## 2015-01-10 ENCOUNTER — Other Ambulatory Visit: Payer: Self-pay | Admitting: Gastroenterology

## 2015-01-10 DIAGNOSIS — R1319 Other dysphagia: Secondary | ICD-10-CM | POA: Insufficient documentation

## 2015-01-10 DIAGNOSIS — R1310 Dysphagia, unspecified: Secondary | ICD-10-CM | POA: Insufficient documentation

## 2015-01-14 ENCOUNTER — Ambulatory Visit
Admission: RE | Admit: 2015-01-14 | Discharge: 2015-01-14 | Disposition: A | Discharge status: Home / Self Care | Payer: Medicare Other | Source: Ambulatory Visit | Attending: Gastroenterology | Admitting: Gastroenterology

## 2015-01-14 DIAGNOSIS — R1319 Other dysphagia: Secondary | ICD-10-CM | POA: Diagnosis not present

## 2015-01-14 DIAGNOSIS — R1310 Dysphagia, unspecified: Secondary | ICD-10-CM

## 2015-01-22 ENCOUNTER — Ambulatory Visit (INDEPENDENT_AMBULATORY_CARE_PROVIDER_SITE_OTHER): Payer: Medicare Other | Admitting: Internal Medicine

## 2015-01-22 ENCOUNTER — Encounter: Payer: Self-pay | Admitting: Internal Medicine

## 2015-01-22 VITALS — BP 101/61 | HR 64 | Temp 97.8°F | Ht 72.0 in | Wt 173.2 lb

## 2015-01-22 DIAGNOSIS — F4321 Adjustment disorder with depressed mood: Secondary | ICD-10-CM | POA: Diagnosis not present

## 2015-01-22 DIAGNOSIS — R1312 Dysphagia, oropharyngeal phase: Secondary | ICD-10-CM | POA: Diagnosis not present

## 2015-01-22 NOTE — Patient Instructions (Signed)
We will set up referral to ENT.  Follow up in 4 weeks.

## 2015-01-22 NOTE — Progress Notes (Signed)
Subjective:    Patient ID: Timothy Turner, male    DOB: Feb 26, 1941, 74 y.o.   MRN: [id number]  HPI  [name] presents for followup.  Last visit, dose of Lexapro increased to 10mg  daily. Notes some improvement in sleep on this dose. Mood slightly improved.  GERD - Noted to have aspiration of barium into trachea on study.  Notes coughing with eating or drinking. Also has trouble swallowing pill, as they get stuck. Scheduled for upper endoscopy later this month.  Past medical, surgical, family and social history per today's encounter.  Review of Systems  Constitutional: Negative for fever, chills, activity change, appetite change, fatigue and unexpected weight change.  HENT: Positive for trouble swallowing. Negative for sore throat.   Eyes: Negative for visual disturbance.  Respiratory: Positive for cough. Negative for shortness of breath.   Cardiovascular: Negative for chest pain, palpitations and leg swelling.  Gastrointestinal: Negative for abdominal pain, diarrhea, constipation and abdominal distention.  Genitourinary: Negative for dysuria, urgency and difficulty urinating.  Musculoskeletal: Negative for arthralgias and gait problem.  Skin: Negative for color change and rash.  Hematological: Negative for adenopathy.  Psychiatric/Behavioral: Negative for suicidal ideas, sleep disturbance and dysphoric mood. The patient is not nervous/anxious.        Objective:    BP 101/61 mmHg  Pulse 64  Temp(Src) 97.8 F (36.6 C) (Oral)  Ht 6' (1.829 m)  Wt 173 lb 4 oz (78.586 kg)  BMI 23.49 kg/m2  SpO2 96% Physical Exam  Constitutional: He is oriented to person, place, and time. He appears well-developed and well-nourished. No distress.  HENT:  Head: Normocephalic and atraumatic.  Right Ear: External ear normal.  Left Ear: External ear normal.  Nose: Nose normal.  Mouth/Throat: Oropharynx is clear and moist. No oropharyngeal exudate.  Eyes: Conjunctivae and EOM are normal.  Pupils are equal, round, and reactive to light. Right eye exhibits no discharge. Left eye exhibits no discharge. No scleral icterus.  Neck: Normal range of motion. Neck supple. No tracheal deviation present. No thyromegaly present.  Cardiovascular: Normal rate, regular rhythm and normal heart sounds.  Exam reveals no gallop and no friction rub.   No murmur heard. Pulmonary/Chest: Effort normal and breath sounds normal. No accessory muscle usage. No tachypnea. No respiratory distress. He has no decreased breath sounds. He has no wheezes. He has no rhonchi. He has no rales. He exhibits no tenderness.  Musculoskeletal: Normal range of motion. He exhibits no edema.  Lymphadenopathy:    He has no cervical adenopathy.  Neurological: He is alert and oriented to person, place, and time. No cranial nerve deficit. Coordination normal.  Skin: Skin is warm and dry. No rash noted. He is not diaphoretic. No erythema. No pallor.  Psychiatric: He has a normal mood and affect. His behavior is normal. Judgment and thought content normal. He expresses no suicidal ideation.          Assessment & Plan:   Problem List Items Addressed This Visit      Unprioritized   Adjustment disorder with depressed mood - Primary    Symptoms of depressed mood improved with increased dose of Lexapro. Will continue.      Dysphagia, oropharyngeal phase    Chronic dysphagia. Reviewed recent upper GI which showed aspiration of thick liquid into the trachea and esophagus. Recommended ENT evaluation. Referral placed. He also has follow up with GI for EGD this month.      Relevant Orders   Ambulatory referral to ENT  Return in about 4 weeks (around 02/19/2015) for Recheck.

## 2015-01-22 NOTE — Assessment & Plan Note (Signed)
Symptoms of depressed mood improved with increased dose of Lexapro. Will continue.

## 2015-01-22 NOTE — Progress Notes (Signed)
Pre visit review using our clinic review tool, if applicable. No additional management support is needed unless otherwise documented below in the visit note. 

## 2015-01-22 NOTE — Assessment & Plan Note (Signed)
Chronic dysphagia. Reviewed recent upper GI which showed aspiration of thick liquid into the trachea and esophagus. Recommended ENT evaluation. Referral placed. He also has follow up with GI for EGD this month.

## 2015-02-01 ENCOUNTER — Other Ambulatory Visit: Payer: Self-pay | Admitting: Unknown Physician Specialty

## 2015-02-01 DIAGNOSIS — R1310 Dysphagia, unspecified: Secondary | ICD-10-CM

## 2015-02-04 ENCOUNTER — Ambulatory Visit
Admission: RE | Admit: 2015-02-04 | Discharge: 2015-02-04 | Disposition: A | Discharge status: Home / Self Care | Payer: Medicare Other | Source: Ambulatory Visit | Attending: Unknown Physician Specialty | Admitting: Unknown Physician Specialty

## 2015-02-04 ENCOUNTER — Telehealth: Payer: Self-pay | Admitting: *Deleted

## 2015-02-04 DIAGNOSIS — R1310 Dysphagia, unspecified: Secondary | ICD-10-CM

## 2015-02-04 DIAGNOSIS — R1313 Dysphagia, pharyngeal phase: Secondary | ICD-10-CM

## 2015-02-04 DIAGNOSIS — K2270 Barrett's esophagus without dysplasia: Secondary | ICD-10-CM | POA: Diagnosis not present

## 2015-02-04 NOTE — Therapy (Signed)
[place], [place], [address] Phone: [phone]   Fax:     Modified Barium Swallow  Patient Details  Name: Timothy Turner MRN: [id number] Date of Birth: 09-10-1940 Referring Provider:  Beverly Gust, MD  Encounter Date: 02/04/2015      End of Session - 02/04/15 1348    Visit Number 1   Number of Visits 1   Date for SLP Re-Evaluation 02/04/15   SLP Start Time 1220   SLP Stop Time  1315   SLP Time Calculation (min) 55 min   Activity Tolerance Patient tolerated treatment well      Past Medical History  Diagnosis Date  . Heart murmur     History of  . Mitral valve disease   . Hypertension   . Hemorrhoids   . Psoriasis   . Thyroiditis   . Rheumatic heart valve incompetence   . Pain syndrome, chronic     Severe  . Myalgia     Multiple  . Prostate cancer   . Gallstones   . Barrett esophagus     Past Surgical History  Procedure Laterality Date  . Back surgery    . Hemorroidectomy    . Upper gi endoscopy  03-06-2013    Dr Donnella Sham  . Colonoscopy  03-08-12    Dr Donnella Sham  . Cholecystectomy  08/03/13    There were no vitals filed for this visit.  Visit Diagnosis: Pharyngeal dysphagia  Dysphagia - Plan: DG OP Swallowing Func-Medicare/Speech Path, DG OP Swallowing Func-Medicare/Speech Path   Subjective: Patient behavior: Patient alert and able to relay his pertinent medical history   Chief complaint: patient complains of foods sticking in his throat   Objective:  Radiological Procedure: A videoflouroscopic evaluation of oral-preparatory, reflex initiation, and pharyngeal phases of the swallow was performed; as well as a screening of the upper esophageal phase.  I. POSTURE: Upright in MBS chair  II. VIEW: Lateral  III. COMPENSATORY STRATEGIES: Hard swallow, hawk and swallow, chin tuck.  The hawk and swallow maneuver helped to clear pharyngeal residue.  IV. BOLUSES  ADMINISTERED:   Thin Liquid: 2 sips, 3 rapid multiple sips   Nectar-thick Liquid: 1 sip    Puree: 3 teaspoons   Mechanical Soft: 1/4 graham cracker in applesauce  V. RESULTS OF EVALUATION: A. ORAL PREPARATORY PHASE: (The lips, tongue, and velum are observed for strength and coordination)  Within normal limits       **Overall Severity Rating: WNL  B. SWALLOW INITIATION/REFLEX: (The reflex is normal if "triggered" by the time the bolus reached the base of the tongue) Within normal limits  **Overall Severity Rating: WNL  C. PHARYNGEAL PHASE: (Pharyngeal function is normal if the bolus shows rapid, smooth, and continuous transit through the pharynx and there is no pharyngeal residue after the swallow) decreased pharyngeal pressure generation (decreased tongue base retraction), decreased hyolaryngeal movement, incomplete epiglottic inversion, decreased duration and amplitude of UES opening, and mild pharyngeal residue.     **Overall Severity Rating: Mild  D. LARYNGEAL PENETRATION: (Material entering into the laryngeal inlet/vestibule but not aspirated) Flash penetration without aspiration  E. ASPIRATION: None  F. ESOPHAGEAL PHASE: (Screening of the upper esophagus) N/A  ASSESSMENT:  This 74 year old man; with recent barium swallow study positive for aspiration, past medical history positive for Barrett's esophagus; is presenting with mild pharyngeal dysphagia characterized decreased pharyngeal pressure generation (decreased tongue base retraction), decreased hyolaryngeal movement, incomplete epiglottic inversion, decreased duration and  amplitude of UES opening, and mild pharyngeal residue.   There was flash laryngeal penetration of liquids (nectar-thick and thin).  A hawking and swallow maneuver is effective in clearing most pharyngeal residue.  The patient does not appear to be at risk for significant amount of prandial aspiration.  The patient was given written and verbal teaching regarding  the Shaker exercise (Shaker exercise consists of isometric and isotonic contraction movement, enhancing the strength of strap muscles and increasing the opening of UES).  He is advised to soften/moisten foods as needed, take small bites / sips, and to alternate solids and liquids.  The patient reported to me that he has experienced increasing shortness of breath such that he is no longer exercising.  I advised his to follow up with his MD regarding this.   PLAN/RECOMMENDATIONS:  A. Diet: Soft/moist as needed   B. Swallowing Precautions: Small bites/sips, alternate solids/liquids, hawk and swallow as needed   C. Recommended consultation to follow with GI as scheduled; patient advised to consult his primary                         physician about his increased shortness of breath    D. Therapy recommendations patient given handout and training for the Shaker exercise   E. Results and recommendations were the patient immediately following the study.  Final report will                     be routed to the referring MD.           G-Codes - 02-11-15 1349    Functional Assessment Tool Used MBSS   Functional Limitations Swallowing   Swallow Current Status (S0630) At least 20 percent but less than 40 percent impaired, limited or restricted   Swallow Goal Status (Z6010) At least 20 percent but less than 40 percent impaired, limited or restricted   Swallow Discharge Status [phone]) At least 20 percent but less than 40 percent impaired, limited or restricted       Problem List Patient Active Problem List   Diagnosis Date Noted  . Dysphagia, oropharyngeal phase 01/22/2015  . Muscle pain 12/17/2014  . Adjustment disorder with depressed mood 11/12/2014  . Barrett esophagus 11/12/2014  . Mitral valve prolapse 09/12/2014  . Renal insufficiency 01/22/2014  . Muscle cramps 12/19/2013  . Mitral valve regurgitation 09/14/2013  . 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 Medicare annual wellness visit,  subsequent 02/10/2012  . Iron deficiency anemia 02/10/2012  . Smoking history 02/01/2012  . Chronic pain syndrome 08/12/2011  . Hyperlipidemia 08/12/2011  . Hypothyroidism 03/12/2011  . PVD (peripheral vascular disease) 01/27/2011  . MURMUR 07/24/2009   Leroy Sea, MS/CCC- SLP  Lou Miner 2015/02/11, 1:51 PM  [facility] [place], [place], [address] Phone: [phone]   Fax:

## 2015-02-04 NOTE — Telephone Encounter (Signed)
Pt called states he is becoming very short of breath.  Pt states he has had to stop his exercise routine and decrease his work due to the shortness of breath.  Please advise

## 2015-02-04 NOTE — Telephone Encounter (Signed)
If acute dyspnea, then needs ER evaluation, however if dyspnea is only with exertion, we should see him in an office visit asap to evaluate. This would be very unusual for him.

## 2015-02-05 NOTE — Telephone Encounter (Signed)
Pt scheduled appoint

## 2015-02-06 ENCOUNTER — Ambulatory Visit (INDEPENDENT_AMBULATORY_CARE_PROVIDER_SITE_OTHER)
Admission: RE | Admit: 2015-02-06 | Discharge: 2015-02-06 | Disposition: A | Discharge status: Home / Self Care | Payer: Medicare Other | Source: Ambulatory Visit | Attending: Internal Medicine | Admitting: Internal Medicine

## 2015-02-06 ENCOUNTER — Encounter: Payer: Self-pay | Admitting: Internal Medicine

## 2015-02-06 ENCOUNTER — Ambulatory Visit (INDEPENDENT_AMBULATORY_CARE_PROVIDER_SITE_OTHER): Payer: Medicare Other | Admitting: Internal Medicine

## 2015-02-06 VITALS — BP 115/68 | HR 58 | Temp 97.9°F | Ht 72.0 in | Wt 171.5 lb

## 2015-02-06 DIAGNOSIS — R0600 Dyspnea, unspecified: Secondary | ICD-10-CM | POA: Diagnosis not present

## 2015-02-06 DIAGNOSIS — J449 Chronic obstructive pulmonary disease, unspecified: Secondary | ICD-10-CM | POA: Insufficient documentation

## 2015-02-06 NOTE — Progress Notes (Signed)
Received a note from PMD, Dr. Gilford Rile She is concerned about patients SOB, She has suggested a stress test,  Would suggest to him a treadmill myoview if he can walk, otherwise a lexiscan myoview With follow up in clinic  She did suggest an echo, by echo in 4/16 looked very good. Suspect may not need to repeat

## 2015-02-06 NOTE — Progress Notes (Signed)
Subjective:    Patient ID: Timothy Turner, male    DOB: [birth year], 73 y.o.   MRN: [id number]  HPI [name] presents for follow up.  8/15 had swallow study which showed possible trace aspiration of thin liquid. EGD scheduled for 8/29.  Continues to feel hoarse. Seen by ENT. Vocal cords reportedly normal.  Dyspnea - Quit walking because of dyspnea with exertion. Gasping for breath with normal walk. No chest pain. No dyspnea at rest. No cough. No fever. No orthopnea. No LEE. ECHO - 09/2014 showed normal EF, mild aortic regurg.     Past Medical History  Diagnosis Date  . Heart murmur     History of  . Mitral valve disease   . Hypertension   . Hemorrhoids   . Psoriasis   . Thyroiditis   . Rheumatic heart valve incompetence   . Pain syndrome, chronic     Severe  . Myalgia     Multiple  . Prostate cancer   . Gallstones   . Barrett esophagus    Family History  Problem Relation Age of Onset  . Heart failure Mother   . Heart failure Father   . Cancer Sister   . Aneurysm Sister     Brain   Past Surgical History  Procedure Laterality Date  . Back surgery    . Hemorroidectomy    . Upper gi endoscopy  03-06-2013    Dr Donnella Sham  . Colonoscopy  03-08-12    Dr Donnella Sham  . Cholecystectomy  08/03/13   Social History   Social History  . Marital Status: Married    Spouse Name: N/A  . Number of Children: 2  . Years of Education: N/A   Occupational History  . 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 Smoking status: Former Smoker    Quit date: 06/22/2004  . Smokeless tobacco: Never Used  . Alcohol Use: No  . Drug Use: No  . Sexual Activity: Not Asked   Other Topics Concern  . None   Social History Narrative   Drinks 4-5 cups of coffee a day and 3-4 cans of soda a day.    Review of Systems  Constitutional: Negative for fever, chills, activity change, appetite change, fatigue and unexpected weight change.    Eyes: Negative for visual disturbance.  Respiratory: Positive for shortness of breath. Negative for cough, chest tightness and wheezing.   Cardiovascular: Negative for chest pain, palpitations and leg swelling.  Gastrointestinal: Negative for abdominal pain and abdominal distention.  Genitourinary: Negative for dysuria, urgency and difficulty urinating.  Musculoskeletal: Negative for arthralgias and gait problem.  Skin: Negative for color change and rash.  Hematological: Negative for adenopathy.  Psychiatric/Behavioral: Negative for sleep disturbance and dysphoric mood. The patient is not nervous/anxious.        Objective:    BP 115/68 mmHg  Pulse 58  Temp(Src) 97.9 F (36.6 C) (Oral)  Ht 6' (1.829 m)  Wt 171 lb 8 oz (77.792 kg)  BMI 23.25 kg/m2  SpO2 97% Physical Exam  Constitutional: He is oriented to person, place, and time. He appears well-developed and well-nourished. No distress.  HENT:  Head: Normocephalic and atraumatic.  Right Ear: External ear normal.  Left Ear: External ear normal.  Nose: Nose normal.  Mouth/Throat: Oropharynx is clear and moist.  Eyes: Conjunctivae and EOM are normal. Pupils are equal, round, and reactive to light. Right eye exhibits  no discharge. Left eye exhibits no discharge. No scleral icterus.  Neck: Normal range of motion. Neck supple. No tracheal deviation present. No thyromegaly present.  Cardiovascular: Normal rate, regular rhythm and normal heart sounds.  Exam reveals no gallop and no friction rub.   No murmur heard. Pulmonary/Chest: Effort normal and breath sounds normal. No accessory muscle usage. No tachypnea. No respiratory distress. He has no decreased breath sounds. He has no wheezes. He has no rhonchi. He has no rales. He exhibits no tenderness.  Musculoskeletal: Normal range of motion. He exhibits no edema.  Lymphadenopathy:    He has no cervical adenopathy.  Neurological: He is alert and oriented to person, place, and time. No  cranial nerve deficit. Coordination normal.  Skin: Skin is warm and dry. No rash noted. He is not diaphoretic. No erythema. No pallor.  Psychiatric: He has a normal mood and affect. His behavior is normal. Judgment and thought content normal.          Assessment & Plan:   Problem List Items Addressed This Visit      Unprioritized   Dyspnea - Primary    Recent dyspnea on exertion which is unusual for him. Exam is normal. EKG today stable. Will set up earlier cardiology evaluation for possible repeat ECHO to evaluate aortic valve and possible stress test. He is a former smoker, and if cardiac evaluation unremarkable, then will consider pulmonary evaluation with PFTs.       Relevant Orders   EKG 12-Lead (Completed)   DG Chest 2 View       Return in about 4 weeks (around 03/06/2015).

## 2015-02-06 NOTE — Patient Instructions (Addendum)
Please go to [facility] for a Chest xray.  We will set up follow up with Dr. Rockey Situ for possible ECHO and/or stress test.  Follow up here in 4 weeks and as needed.

## 2015-02-06 NOTE — Assessment & Plan Note (Signed)
Recent dyspnea on exertion which is unusual for him. Exam is normal. EKG today stable. Will set up earlier cardiology evaluation for possible repeat ECHO to evaluate aortic valve and possible stress test. He is a former smoker, and if cardiac evaluation unremarkable, then will consider pulmonary evaluation with PFTs.

## 2015-02-06 NOTE — Progress Notes (Signed)
Pre visit review using our clinic review tool, if applicable. No additional management support is needed unless otherwise documented below in the visit note. 

## 2015-02-07 NOTE — Progress Notes (Signed)
Don't know if prior message came through, He had echo 4/16, looked reasonable May need exercise myoview for SOB to rule out ischemia

## 2015-02-08 ENCOUNTER — Ambulatory Visit: Payer: Medicare Other

## 2015-02-18 ENCOUNTER — Encounter: Payer: Self-pay | Admitting: Anesthesiology

## 2015-02-18 ENCOUNTER — Encounter
Admission: RE | Disposition: A | Discharge status: Home / Self Care | Payer: Self-pay | Source: Ambulatory Visit | Attending: Gastroenterology

## 2015-02-18 ENCOUNTER — Ambulatory Visit: Payer: Medicare Other | Admitting: Anesthesiology

## 2015-02-18 ENCOUNTER — Ambulatory Visit
Admission: RE | Admit: 2015-02-18 | Discharge: 2015-02-18 | Disposition: A | Discharge status: Home / Self Care | Payer: Medicare Other | Source: Ambulatory Visit | Attending: Gastroenterology | Admitting: Gastroenterology

## 2015-02-18 DIAGNOSIS — I0989 Other specified rheumatic heart diseases: Secondary | ICD-10-CM | POA: Diagnosis not present

## 2015-02-18 DIAGNOSIS — E039 Hypothyroidism, unspecified: Secondary | ICD-10-CM | POA: Insufficient documentation

## 2015-02-18 DIAGNOSIS — Z888 Allergy status to other drugs, medicaments and biological substances status: Secondary | ICD-10-CM | POA: Diagnosis not present

## 2015-02-18 DIAGNOSIS — Z8546 Personal history of malignant neoplasm of prostate: Secondary | ICD-10-CM | POA: Diagnosis not present

## 2015-02-18 DIAGNOSIS — K2950 Unspecified chronic gastritis without bleeding: Secondary | ICD-10-CM | POA: Insufficient documentation

## 2015-02-18 DIAGNOSIS — K210 Gastro-esophageal reflux disease with esophagitis: Secondary | ICD-10-CM | POA: Insufficient documentation

## 2015-02-18 DIAGNOSIS — R0602 Shortness of breath: Secondary | ICD-10-CM | POA: Diagnosis not present

## 2015-02-18 DIAGNOSIS — K2270 Barrett's esophagus without dysplasia: Secondary | ICD-10-CM | POA: Diagnosis not present

## 2015-02-18 DIAGNOSIS — Z87891 Personal history of nicotine dependence: Secondary | ICD-10-CM | POA: Insufficient documentation

## 2015-02-18 DIAGNOSIS — D649 Anemia, unspecified: Secondary | ICD-10-CM | POA: Insufficient documentation

## 2015-02-18 DIAGNOSIS — L409 Psoriasis, unspecified: Secondary | ICD-10-CM | POA: Diagnosis not present

## 2015-02-18 DIAGNOSIS — G894 Chronic pain syndrome: Secondary | ICD-10-CM | POA: Insufficient documentation

## 2015-02-18 DIAGNOSIS — R011 Cardiac murmur, unspecified: Secondary | ICD-10-CM | POA: Diagnosis not present

## 2015-02-18 DIAGNOSIS — R1310 Dysphagia, unspecified: Secondary | ICD-10-CM | POA: Insufficient documentation

## 2015-02-18 DIAGNOSIS — I10 Essential (primary) hypertension: Secondary | ICD-10-CM | POA: Diagnosis not present

## 2015-02-18 HISTORY — PX: ESOPHAGOGASTRODUODENOSCOPY (EGD) WITH PROPOFOL: SHX5813

## 2015-02-18 SURGERY — ESOPHAGOGASTRODUODENOSCOPY (EGD) WITH PROPOFOL
Anesthesia: General

## 2015-02-18 MED ORDER — EPHEDRINE SULFATE 50 MG/ML IJ SOLN
INTRAMUSCULAR | Status: DC | PRN
  Administered 2015-02-18: 10 mg via INTRAVENOUS

## 2015-02-18 MED ORDER — MIDAZOLAM HCL 2 MG/2ML IJ SOLN
INTRAMUSCULAR | Status: DC | PRN
  Administered 2015-02-18: 1 mg via INTRAVENOUS

## 2015-02-18 MED ORDER — PROPOFOL INFUSION 10 MG/ML OPTIME
INTRAVENOUS | Status: DC | PRN
  Administered 2015-02-18: 100 ug/kg/min via INTRAVENOUS

## 2015-02-18 MED ORDER — PROPOFOL 10 MG/ML IV BOLUS
INTRAVENOUS | Status: DC | PRN
  Administered 2015-02-18 (×2): 20 mg via INTRAVENOUS

## 2015-02-18 MED ORDER — SODIUM CHLORIDE 0.9 % IV SOLN
INTRAVENOUS | Status: DC
  Administered 2015-02-18 (×2): via INTRAVENOUS

## 2015-02-18 MED ORDER — SODIUM CHLORIDE 0.9 % IV SOLN: INTRAVENOUS | Status: DC

## 2015-02-18 NOTE — Op Note (Signed)
[facility]enterology Patient Name: Shrihan Putt Procedure Date: 02/18/2015 11:34 AM MRN: [id number] Account #: [id number] Date of Birth: 1940-11-29 Admit Type: Outpatient Age: 74 Room: [facility] ENDO ROOM 2 Gender: Male Note Status: Finalized Procedure:         Upper GI endoscopy Indications:       Dysphagia, Follow-up of Barrett's esophagus Providers:         Lollie Sails, MD Referring MD:      Eduard Clos. Gilford Rile, MD (Referring MD) Medicines:         Monitored Anesthesia Care Complications:     No immediate complications. Procedure:         Pre-Anesthesia Assessment:                    - ASA Grade Assessment: II - A patient with mild systemic                     disease.                    After obtaining informed consent, the endoscope was passed                     under direct vision. Throughout the procedure, the                     patient's blood pressure, pulse, and oxygen saturations                     were monitored continuously. The Endoscope was introduced                     through the mouth, and advanced to the third part of                     duodenum. The patient tolerated the procedure well. Findings:      There were esophageal mucosal changes secondary to established       short-segment Barrett's disease present at the gastroesophageal       junction. The maximum longitudinal extent of these mucosal changes was 1       cm in length. Mucosa was biopsied with a cold forceps for histology in 4       quadrants.      there is a very sharply angulate egress into the stomach at the lever of       the [place]. The appearance is almost that of a small paraesophageal       hernia.      The exam of the esophagus was otherwise normal.      Patchy mild inflammation characterized by congestion (edema) and       erythema was found in the gastric antrum. Biopsies were taken with a       cold forceps for histology.      The cardia and  gastric fundus were normal on retroflexion.      The examined duodenum was normal.      The exam of the esophagus was otherwise normal. Impression:        - Esophageal mucosal changes secondary to established                     short-segment Barrett's disease. Biopsied.                    -  Gastritis. Biopsied.                    - Normal examined duodenum. Recommendation:    - Await pathology results.                    - Continue present medications.                    - Perform routine esophageal manometry at appointment to                     be scheduled. Procedure Code(s): --- Professional ---                    [phone], Esophagogastroduodenoscopy, flexible, transoral;                     with biopsy, single or multiple Diagnosis Code(s): --- Professional ---                    530.85, Barrett's esophagus                    535.50, Unspecified gastritis and gastroduodenitis,                     without mention of hemorrhage                    787.20, Dysphagia, unspecified CPT copyright 2014 American Medical Association. All rights reserved. The codes documented in this report are preliminary and upon coder review may  be revised to meet current compliance requirements. Lollie Sails, MD 02/18/2015 11:59:30 AM This report has been signed electronically. Number of Addenda: 0 Note Initiated On: 02/18/2015 11:34 AM      [facility]

## 2015-02-18 NOTE — Transfer of Care (Signed)
Immediate Anesthesia Transfer of Care Note  Patient: Timothy Turner  Procedure(s) Performed: Procedure(s): ESOPHAGOGASTRODUODENOSCOPY (EGD) WITH PROPOFOL (N/A)  Patient Location: PACU  Anesthesia Type:General  Level of Consciousness: awake, alert  and oriented  Airway & Oxygen Therapy: Patient Spontanous Breathing and Patient connected to nasal cannula oxygen  Post-op Assessment: Report given to RN and Post -op Vital signs reviewed and stable  Post vital signs: Reviewed and stable  Last Vitals:  Filed Vitals:   02/18/15 1054  BP: 129/70  Pulse: 59  Temp: 35.9 C  Resp: 17    Complications: No apparent anesthesia complications

## 2015-02-18 NOTE — H&P (Addendum)
Outpatient short stay form Pre-procedure 02/18/2015 11:37 AM Timothy Sails MD  Primary Physician: Dr. Ronette Deter  Reason for visit:  Follow-up Barrett's esophagus, dysphagia  History of present illness:  Patient is a 74 year old male with a history of Barrett's esophagus. 82 over the period past 6 months he is developed some difficulty in the neck and upper chest area. He's had a barium swallow which indicates leakage around the upper glottis airway as well as vallecular pooling. He has also had a modified barium swallow that indicates he has difficulty with thin liquids. He is not regurgitating foods.  He takes no thinners or aspirin products.    Current facility-administered medications:  .  0.9 %  sodium chloride infusion, , Intravenous, Continuous, Timothy Sails, MD, Last Rate: 20 mL/hr at 02/18/15 1106 .  0.9 %  sodium chloride infusion, , Intravenous, Continuous, Timothy Sails, MD  Prescriptions prior to admission  Medication Sig Dispense Refill Last Dose  . calcipotriene (DOVONEX) 0.005 % cream Apply topically.     Past Week at Unknown time  . celecoxib (CELEBREX) 200 MG capsule Take 1 capsule (200 mg total) by mouth 2 (two) times daily. 180 capsule 1 02/18/2015 at 1900  . cetirizine (ZYRTEC) 10 MG tablet Take 10 mg by mouth daily.   Past Week at Unknown time  . Cholecalciferol (VITAMIN D HIGH POTENCY) 1000 UNITS capsule Take 1,000 Units by mouth daily. Take one tablet by mouth two times a day.    Past Week at Unknown time  . clobetasol (TEMOVATE) 0.05 % cream Apply topically 2 (two) times daily.     Past Week at Unknown time  . clonazePAM (KLONOPIN) 1 MG tablet Take 1-2 tablets (1-2 mg total) by mouth 3 (three) times daily as needed (severe muscle spasm). 180 tablet 3 02/17/2015 at 2100  . escitalopram (LEXAPRO) 10 MG tablet Take 1 tablet (10 mg total) by mouth daily. 90 tablet 3 Past Week at Unknown time  . esomeprazole (NEXIUM) 40 MG capsule Take 40 mg by mouth  daily before breakfast.     02/17/2015 at 0900  . ezetimibe (ZETIA) 10 MG tablet Take 1 tablet (10 mg total) by mouth daily. 90 tablet 1 02/17/2015 at 0900  . finasteride (PROSCAR) 5 MG tablet Take 5 mg by mouth daily.     02/17/2015 at 2100  . Glucosamine-Chondroitin (OSTEO BI-FLEX REGULAR STRENGTH) 250-200 MG TABS Take by mouth. Take one tablet daily.    Past Week at Unknown time  . levothyroxine (SYNTHROID, LEVOTHROID) 75 MCG tablet TAKE 1 TABLET DAILY 90 tablet 3 02/17/2015 at 0900  . loratadine (CLARITIN) 10 MG tablet Take 10 mg by mouth daily.     Past Week at Unknown time  . Magnesium 250 MG TABS Take by mouth.   Past Week at Unknown time  . metoprolol tartrate (LOPRESSOR) 25 MG tablet Take 25 mg by mouth 2 (two) times daily as needed. As needed for palpitations   Past Week at Unknown time  . Multiple Vitamins-Minerals (CENTRUM SILVER PO) Take by mouth.     Past Week at Unknown time  . Omega-3 Fatty Acids (FISH OIL) 1000 MG CAPS Take 1,000 mg by mouth. Take two tablets daily.    Past Week at Unknown time  . WELCHOL 625 MG tablet TAKE 3 TABLETS TWICE DAILY WITH MEALS (NEED TO CONTACT OFFICE TO SCHEDULE FUTURE APPOINTMENT AND REFILLS) 540 tablet 3 02/17/2015 at 0900  . Testosterone 30 MG/ACT SOLN Place onto the skin. Axiron  02/16/2015 at 2100     Allergies  Allergen Reactions  . Cymbalta [Duloxetine Hcl]     Unable to urinate  . Rosuvastatin   . Statins      Past Medical History  Diagnosis Date  . Heart murmur     History of  . Mitral valve disease   . Hypertension   . Hemorrhoids   . Psoriasis   . Thyroiditis   . Rheumatic heart valve incompetence   . Pain syndrome, chronic     Severe  . Myalgia     Multiple  . Prostate cancer   . Gallstones   . Barrett esophagus     Review of systems:      Physical Exam    Heart and lungs: Regular rate and rhythm without rub or gallop, lungs are bilaterally clear    HEENT: Normocephalic atraumatic eyes are anicteric    Other:      Pertinant exam for procedure: Soft nontender nondistended bowel sounds positive normoactive    Planned proceedures: EGD and indicated procedures I have discussed the risks benefits and complications of procedures to include not limited to bleeding, infection, perforation and the risk of sedation and the patient wishes to proceed.    Timothy Sails, MD Gastroenterology 02/18/2015  11:37 AM

## 2015-02-18 NOTE — Anesthesia Preprocedure Evaluation (Signed)
Anesthesia Evaluation  Patient identified by MRN, date of birth, ID band Patient awake    Reviewed: Allergy & Precautions, NPO status   Airway Mallampati: II       Dental  (+) Upper Dentures, Lower Dentures   Pulmonary shortness of breath and with exertion, former smoker,    Pulmonary exam normal       Cardiovascular hypertension, Normal cardiovascular exam    Neuro/Psych    GI/Hepatic negative GI ROS, Neg liver ROS,   Endo/Other  Hypothyroidism   Renal/GU      Musculoskeletal   Abdominal Normal abdominal exam  (+)   Peds  Hematology  (+) anemia ,   Anesthesia Other Findings   Reproductive/Obstetrics                             Anesthesia Physical Anesthesia Plan  ASA: II  Anesthesia Plan: General   Post-op Pain Management:    Induction: Intravenous  Airway Management Planned: Nasal Cannula  Additional Equipment:   Intra-op Plan:   Post-operative Plan:   Informed Consent: I have reviewed the patients History and Physical, chart, labs and discussed the procedure including the risks, benefits and alternatives for the proposed anesthesia with the patient or authorized representative who has indicated his/her understanding and acceptance.     Plan Discussed with: CRNA  Anesthesia Plan Comments:         Anesthesia Quick Evaluation

## 2015-02-19 ENCOUNTER — Encounter: Payer: Self-pay | Admitting: Gastroenterology

## 2015-02-19 NOTE — Anesthesia Postprocedure Evaluation (Signed)
  Anesthesia Post-op Note  Patient: Timothy Turner  Procedure(s) Performed: Procedure(s): ESOPHAGOGASTRODUODENOSCOPY (EGD) WITH PROPOFOL (N/A)  Anesthesia type:General  Patient location: PACU  Post pain: Pain level controlled  Post assessment: Post-op Vital signs reviewed, Patient's Cardiovascular Status Stable, Respiratory Function Stable, Patent Airway and No signs of Nausea or vomiting  Post vital signs: Reviewed and stable  Last Vitals:  Filed Vitals:   02/18/15 1230  BP: 154/85  Pulse: 55  Temp:   Resp: 12    Level of consciousness: awake, alert  and patient cooperative  Complications: No apparent anesthesia complications

## 2015-02-20 LAB — SURGICAL PATHOLOGY

## 2015-02-21 ENCOUNTER — Ambulatory Visit (INDEPENDENT_AMBULATORY_CARE_PROVIDER_SITE_OTHER): Payer: Medicare Other | Admitting: Cardiovascular Disease

## 2015-02-21 ENCOUNTER — Other Ambulatory Visit: Payer: Self-pay | Admitting: Cardiovascular Disease

## 2015-02-21 ENCOUNTER — Other Ambulatory Visit
Admission: RE | Admit: 2015-02-21 | Discharge: 2015-02-21 | Disposition: A | Discharge status: Home / Self Care | Payer: Medicare Other | Source: Ambulatory Visit | Attending: Cardiovascular Disease | Admitting: Cardiovascular Disease

## 2015-02-21 ENCOUNTER — Encounter: Payer: Self-pay | Admitting: Cardiovascular Disease

## 2015-02-21 VITALS — BP 136/86 | HR 58 | Ht 72.0 in | Wt 170.8 lb

## 2015-02-21 DIAGNOSIS — R0602 Shortness of breath: Secondary | ICD-10-CM | POA: Insufficient documentation

## 2015-02-21 DIAGNOSIS — R5383 Other fatigue: Secondary | ICD-10-CM

## 2015-02-21 DIAGNOSIS — R0600 Dyspnea, unspecified: Secondary | ICD-10-CM

## 2015-02-21 DIAGNOSIS — R1312 Dysphagia, oropharyngeal phase: Secondary | ICD-10-CM

## 2015-02-21 DIAGNOSIS — E785 Hyperlipidemia, unspecified: Secondary | ICD-10-CM

## 2015-02-21 DIAGNOSIS — I739 Peripheral vascular disease, unspecified: Secondary | ICD-10-CM

## 2015-02-21 DIAGNOSIS — I209 Angina pectoris, unspecified: Secondary | ICD-10-CM | POA: Diagnosis not present

## 2015-02-21 LAB — CBC WITH DIFFERENTIAL/PLATELET
Basophils Absolute: 0.1 10*3/uL (ref 0–0.1)
Basophils Relative: 1 %
EOS ABS: 0.2 10*3/uL (ref 0–0.7)
EOS PCT: 3 %
HCT: 46.7 % (ref 40.0–52.0)
Hemoglobin: 15.5 g/dL (ref 13.0–18.0)
LYMPHS ABS: 2.5 10*3/uL (ref 1.0–3.6)
Lymphocytes Relative: 29 %
MCH: 28.8 pg (ref 26.0–34.0)
MCHC: 33.1 g/dL (ref 32.0–36.0)
MCV: 87 fL (ref 80.0–100.0)
Monocytes Absolute: 0.9 10*3/uL (ref 0.2–1.0)
Monocytes Relative: 10 %
Neutro Abs: 5 10*3/uL (ref 1.4–6.5)
Neutrophils Relative %: 57 %
PLATELETS: 166 10*3/uL (ref 150–440)
RBC: 5.37 MIL/uL (ref 4.40–5.90)
RDW: 13.1 % (ref 11.5–14.5)
WBC: 8.6 10*3/uL (ref 3.8–10.6)

## 2015-02-21 LAB — BASIC METABOLIC PANEL
Anion gap: 6 (ref 5–15)
BUN: 15 mg/dL (ref 6–20)
CALCIUM: 9.2 mg/dL (ref 8.9–10.3)
CO2: 26 mmol/L (ref 22–32)
CREATININE: 1.1 mg/dL (ref 0.61–1.24)
Chloride: 108 mmol/L (ref 101–111)
GFR calc Af Amer: >60 mL/min (ref >60)
Glucose, Bld: 98 mg/dL (ref 65–99)
POTASSIUM: 4.3 mmol/L (ref 3.5–5.1)
SODIUM: 140 mmol/L (ref 135–145)

## 2015-02-21 LAB — PROTIME-INR
INR: 1.01
Prothrombin Time: 13.5 seconds (ref 11.4–15.0)

## 2015-02-21 NOTE — Patient Instructions (Addendum)
No medication changes were made.  We will schedule a cardiac cath for tomorrow for angina, fatigue, shortness of breath  Please call us if you have new issues that need to be addressed before your next appt.  Your physician wants you to follow-up in: 1 month.   [facility] Cardiac Cath Instructions   You are scheduled for a Cardiac Cath on:__Friday, Sept 2________  Please arrive at 9:30 am on the day of your procedure  You will need to pre-register prior to the day of your procedure.  Enter through the [employer] at [facility].  Registration is the first desk on your right.  Please take the procedure order we have given you in order to be registered appropriately  Do not eat/drink anything after midnight  Someone will need to drive you home  It is recommended someone be with you for the first 24 hours after your procedure  Wear clothes that are easy to get on/off and wear slip on shoes if possible  Medications bring a current list of all medications with you  Day of your procedure: Arrive at the [place] entrance.  Free valet service is available.  After entering the [place] please check-in at the registration desk (1st desk on your right) to receive your armband. After receiving your armband someone will escort you to the cardiac cath/special procedures waiting area.  The usual length of stay after your procedure is about 2 to 3 hours.  This can vary.  If you have any questions, please call our office at [phone], or you may call the cardiac cath lab at [facility] directly at [phone] Angiogram An angiogram, also called angiography, is a procedure used to look at the blood vessels that carry blood to different parts of your body (arteries). In this procedure, dye is injected through a long, thin tube (catheter) into an artery. X-rays are then taken. The X-rays will show if there is a blockage or problem in a blood vessel.  LET [facility] CARE PROVIDER KNOW ABOUT: 8. Any allergies  you have, including allergies to shellfish or contrast dye.  9. All medicines you are taking, including vitamins, herbs, eye drops, creams, and over-the-counter medicines.  10. Previous problems you or members of your family have had with the use of anesthetics.  11. Any blood disorders you have.  12. Previous surgeries you have had. 13. Any previous kidney problems or failure you have had. 14. Medical conditions you have.  15. Possibility of pregnancy, if this applies. RISKS AND COMPLICATIONS Generally, an angiogram is a safe procedure. However, as with any procedure, problems can occur. Possible problems include:  Injury to the blood vessels, including rupture or bleeding.  Infection or bruising at the catheter site.  Allergic reaction to the dye or contrast used.  Kidney damage from the dye or contrast used.  Blood clots that can lead to a stroke or heart attack. BEFORE THE PROCEDURE  Do not eat or drink after midnight on the night before the procedure, or as directed by your health care provider.   Ask your health care provider if you may drink enough water to take any needed medicines the morning of the procedure.  PROCEDURE  You may be given a medicine to help you relax (sedative) before and during the procedure. This medicine is given through an IV access tube that is inserted into one of your veins.   The area where the catheter will be inserted will be washed and shaved. This is  usually done in the groin but may be done in the fold of your arm (near your elbow) or in the wrist.  A medicine will be given to numb the area where the catheter will be inserted (local anesthetic).  The catheter will be inserted with a guide wire into an artery. The catheter is guided by using a type of X-ray (fluoroscopy) to the blood vessel being examined.   Dye is then injected into the catheter, and X-rays are taken. The dye helps to show where any narrowing or blockages are  located.  AFTER THE PROCEDURE   If the procedure is done through the leg, you will be kept in bed lying flat for several hours. You will be instructed to not bend or cross your legs.  The insertion site will be checked frequently.  The pulse in your feet or wrist will be checked frequently.  Additional blood tests, X-rays, and electrocardiography may be done.   You may need to stay in the hospital overnight for observation.  Document Released: 03/18/2005 Document Revised: 06/13/2013 Document Reviewed: 11/09/2012 [facility] Patient Information 2015 [place], [place]. This information is not intended to replace advice given to you by your health care provider. Make sure you discuss any questions you have with your health care provider.

## 2015-02-21 NOTE — Progress Notes (Signed)
Patient ID: Timothy Turner, male    DOB: [birth year], 74 y.o.   MRN: [id number]  HPI Comments: Timothy Turner is a very pleasant 74 year-old gentleman with smoking history, moderate bilateral iliac arterial disease in 2015, history of mitral valve prolapse and prior echocardiogram in 2009 reporting moderate to severe MR (outside study ), hyperlipidemia, hypertension with diffuse chronic muscle aches of uncertain etiology who presents for routine followup of his PAD, MR. Long prior smoking history for at least 30 years, stopped when he was in his late 24s  In follow-up, Timothy Turner reports that he has not been doing well. Over the past several months, possibly since April 2016, he has had increasing shortness of breath, chest tightness, fatigue, inability to ambulate very far. Previously was able to walk for several miles at a time, was exercising daily. He previously was walking  6 times around the track., Could go for 45 minutes at a time up to one hour Slowly his exercise tolerance has decreased over the past several months, now can barely walk once around the track without stopping several times. He reports having tremendous shortness of breath with any exertion.  Denies any lower extremity edema, no significant coughing with exertion, sputum production, denies any change in weight. He is very concerned about underlying coronary artery disease.  Also reports having a swallowing issue. Reports that some of his food is going into his lungs. He has follow-up with GI. Etiology unclear. Significant coughing at times, hoarse from vocal cord irritation  EKG not performed on today's visit as he declined. One recently done last week with primary care Sinus bradycardia with rate 56 bpm, no significant ST or T-wave changes  Other past medical history Chronic history of muscle ache in his legs at nighttime, also in his calves when he walks up hills.  He denies any significant near syncope or  syncope.   Allergies  Allergen Reactions  . Cymbalta [Duloxetine Hcl]     Unable to urinate  . Rosuvastatin   . Statins     Outpatient Encounter Prescriptions as of 02/21/2015  Medication Sig  . calcipotriene (DOVONEX) 0.005 % cream Apply topically.    . celecoxib (CELEBREX) 200 MG capsule Take 1 capsule (200 mg total) by mouth 2 (two) times daily.  . cetirizine (ZYRTEC) 10 MG tablet Take 10 mg by mouth daily.  . Cholecalciferol (VITAMIN D HIGH POTENCY) 1000 UNITS capsule Take 1,000 Units by mouth daily. Take one tablet by mouth two times a day.   . clobetasol (TEMOVATE) 0.05 % cream Apply topically 2 (two) times daily.    . clonazePAM (KLONOPIN) 1 MG tablet Take 1-2 tablets (1-2 mg total) by mouth 3 (three) times daily as needed (severe muscle spasm).  [name] escitalopram (LEXAPRO) 10 MG tablet Take 1 tablet (10 mg total) by mouth daily.  [name] esomeprazole (NEXIUM) 40 MG capsule Take 40 mg by mouth daily before breakfast.    . ezetimibe (ZETIA) 10 MG tablet Take 1 tablet (10 mg total) by mouth daily.  . finasteride (PROSCAR) 5 MG tablet Take 5 mg by mouth daily.    . Glucosamine-Chondroitin (OSTEO BI-FLEX REGULAR STRENGTH) 250-200 MG TABS Take by mouth. Take one tablet daily.   [name] levothyroxine (SYNTHROID, LEVOTHROID) 75 MCG tablet TAKE 1 TABLET DAILY  . loratadine (CLARITIN) 10 MG tablet Take 10 mg by mouth daily.    . Magnesium 250 MG TABS Take by mouth.  . metoprolol tartrate (LOPRESSOR) 25 MG tablet Take 25 mg  by mouth 2 (two) times daily as needed. As needed for palpitations  . Multiple Vitamins-Minerals (CENTRUM SILVER PO) Take by mouth.    . Omega-3 Fatty Acids (FISH OIL) 1000 MG CAPS Take 1,000 mg by mouth. Take two tablets daily.   . Testosterone 30 MG/ACT SOLN Place onto the skin. Axiron  . WELCHOL 625 MG tablet TAKE 3 TABLETS TWICE DAILY WITH MEALS (NEED TO CONTACT OFFICE TO SCHEDULE FUTURE APPOINTMENT AND REFILLS)   No facility-administered encounter medications on file as of  02/21/2015.    Past Medical History  Diagnosis Date  . Heart murmur     History of  . Mitral valve disease   . Hypertension   . Hemorrhoids   . Psoriasis   . Thyroiditis   . Rheumatic heart valve incompetence   . Pain syndrome, chronic     Severe  . Myalgia     Multiple  . Prostate cancer   . Gallstones   . Barrett esophagus     Past Surgical History  Procedure Laterality Date  . Back surgery    . Hemorroidectomy    . Upper gi endoscopy  03-06-2013    Dr Donnella Sham  . Colonoscopy  03-08-12    Dr Donnella Sham  . Cholecystectomy  08/03/13  . Esophagogastroduodenoscopy (egd) with propofol N/A 02/18/2015    Procedure: ESOPHAGOGASTRODUODENOSCOPY (EGD) WITH PROPOFOL;  Surgeon: Lollie Sails, MD;  Location: [facility] ENDOSCOPY;  Service: Endoscopy;  Laterality: N/A;    Social History  reports that he quit smoking about 10 years ago. He has never used smokeless tobacco. He reports that he does not drink alcohol or use illicit drugs.  Family History family history includes Aneurysm in his sister; Cancer in his sister; Heart failure in his father and mother.   Review of Systems  Constitutional: Positive for fatigue.  Respiratory: Positive for cough, chest tightness and shortness of breath.   Cardiovascular: Positive for chest pain.  Gastrointestinal: Negative.   Musculoskeletal: Positive for myalgias.  Skin: Negative.   Neurological: Negative.   Hematological: Negative.   Psychiatric/Behavioral: Negative.   All other systems reviewed and are negative.   BP 136/86 mmHg  Pulse 58  Ht 6' (1.829 m)  Wt 170 lb 12 oz (77.452 kg)  BMI 23.15 kg/m2  SpO2 98%  Physical Exam  Constitutional: He is oriented to person, place, and time. He appears well-developed and well-nourished.  HENT:  Head: Normocephalic.  Nose: Nose normal.  Mouth/Throat: Oropharynx is clear and moist.  Eyes: Conjunctivae are normal. Pupils are equal, round, and reactive to light.  Neck: Normal range of motion.  Neck supple. No JVD present.  Cardiovascular: Normal rate, regular rhythm, S1 normal, S2 normal and intact distal pulses.  Exam reveals no gallop and no friction rub.   Murmur heard.  Systolic murmur is present with a grade of 2/6  Pulmonary/Chest: Effort normal and breath sounds normal. No respiratory distress. He has no wheezes. He has no rales. He exhibits no tenderness.  Abdominal: Soft. Bowel sounds are normal. He exhibits no distension. There is no tenderness.  Musculoskeletal: Normal range of motion. He exhibits no edema or tenderness.  Lymphadenopathy:    He has no cervical adenopathy.  Neurological: He is alert and oriented to person, place, and time. Coordination normal.  Skin: Skin is warm and dry. No rash noted. No erythema.  Psychiatric: He has a normal mood and affect. His behavior is normal. Judgment and thought content normal.      Assessment and  Plan   Nursing note and vitals reviewed.

## 2015-02-21 NOTE — Assessment & Plan Note (Addendum)
His progressive shortness of breath over the past several months is very concerning for underlying ischemia. Normally can walk 40 12:55 hour, now with minimal exercise tolerance He has known PAD, long smoking history, likely coronary disease Unable to walk on a treadmill given his symptoms. He is very concerned about his condition as he is very symptomatic, he has indicated he prefers cardiac catheterization. Risk and benefit of the procedure discussed with him. He would like to have the procedure tomorrow. We'll call scheduling, orders have been placed

## 2015-02-21 NOTE — Assessment & Plan Note (Signed)
Moderate bilateral iliac arterial disease, monitored by Dr. Ronalee Belts.

## 2015-02-21 NOTE — Assessment & Plan Note (Signed)
He is following up with GI He reports difficulty swallowing, coughing Worsening GERD

## 2015-02-21 NOTE — Assessment & Plan Note (Signed)
Currently not on a statin given chronic long severe history of myalgias On WelChol

## 2015-02-22 ENCOUNTER — Encounter
Admission: RE | Disposition: A | Discharge status: Home / Self Care | Payer: Self-pay | Source: Ambulatory Visit | Attending: Cardiovascular Disease

## 2015-02-22 ENCOUNTER — Ambulatory Visit
Admission: RE | Admit: 2015-02-22 | Discharge: 2015-02-22 | Disposition: A | Discharge status: Home / Self Care | Payer: Medicare Other | Source: Ambulatory Visit | Attending: Cardiovascular Disease | Admitting: Cardiovascular Disease

## 2015-02-22 ENCOUNTER — Encounter: Payer: Self-pay | Admitting: *Deleted

## 2015-02-22 DIAGNOSIS — I209 Angina pectoris, unspecified: Secondary | ICD-10-CM | POA: Diagnosis not present

## 2015-02-22 DIAGNOSIS — I10 Essential (primary) hypertension: Secondary | ICD-10-CM | POA: Diagnosis not present

## 2015-02-22 DIAGNOSIS — I341 Nonrheumatic mitral (valve) prolapse: Secondary | ICD-10-CM | POA: Insufficient documentation

## 2015-02-22 DIAGNOSIS — R490 Dysphonia: Secondary | ICD-10-CM | POA: Diagnosis not present

## 2015-02-22 DIAGNOSIS — L409 Psoriasis, unspecified: Secondary | ICD-10-CM | POA: Insufficient documentation

## 2015-02-22 DIAGNOSIS — R05 Cough: Secondary | ICD-10-CM | POA: Diagnosis present

## 2015-02-22 DIAGNOSIS — Z8546 Personal history of malignant neoplasm of prostate: Secondary | ICD-10-CM | POA: Insufficient documentation

## 2015-02-22 DIAGNOSIS — Z809 Family history of malignant neoplasm, unspecified: Secondary | ICD-10-CM | POA: Insufficient documentation

## 2015-02-22 DIAGNOSIS — E785 Hyperlipidemia, unspecified: Secondary | ICD-10-CM | POA: Insufficient documentation

## 2015-02-22 DIAGNOSIS — R0602 Shortness of breath: Secondary | ICD-10-CM | POA: Insufficient documentation

## 2015-02-22 DIAGNOSIS — Z720 Tobacco use: Secondary | ICD-10-CM | POA: Diagnosis not present

## 2015-02-22 DIAGNOSIS — I739 Peripheral vascular disease, unspecified: Secondary | ICD-10-CM | POA: Diagnosis present

## 2015-02-22 DIAGNOSIS — Z888 Allergy status to other drugs, medicaments and biological substances status: Secondary | ICD-10-CM | POA: Diagnosis not present

## 2015-02-22 DIAGNOSIS — J449 Chronic obstructive pulmonary disease, unspecified: Secondary | ICD-10-CM | POA: Diagnosis present

## 2015-02-22 DIAGNOSIS — Z87891 Personal history of nicotine dependence: Secondary | ICD-10-CM | POA: Diagnosis not present

## 2015-02-22 DIAGNOSIS — Z9049 Acquired absence of other specified parts of digestive tract: Secondary | ICD-10-CM | POA: Diagnosis not present

## 2015-02-22 DIAGNOSIS — I779 Disorder of arteries and arterioles, unspecified: Secondary | ICD-10-CM | POA: Insufficient documentation

## 2015-02-22 DIAGNOSIS — Z79899 Other long term (current) drug therapy: Secondary | ICD-10-CM | POA: Insufficient documentation

## 2015-02-22 DIAGNOSIS — G894 Chronic pain syndrome: Secondary | ICD-10-CM | POA: Insufficient documentation

## 2015-02-22 DIAGNOSIS — R0789 Other chest pain: Secondary | ICD-10-CM | POA: Insufficient documentation

## 2015-02-22 DIAGNOSIS — Z9889 Other specified postprocedural states: Secondary | ICD-10-CM | POA: Insufficient documentation

## 2015-02-22 DIAGNOSIS — Z8249 Family history of ischemic heart disease and other diseases of the circulatory system: Secondary | ICD-10-CM | POA: Diagnosis not present

## 2015-02-22 DIAGNOSIS — E7849 Other hyperlipidemia: Secondary | ICD-10-CM | POA: Diagnosis present

## 2015-02-22 DIAGNOSIS — R0600 Dyspnea, unspecified: Secondary | ICD-10-CM | POA: Diagnosis not present

## 2015-02-22 HISTORY — PX: CARDIAC CATHETERIZATION: SHX172

## 2015-02-22 HISTORY — DX: Pneumonia, unspecified organism: J18.9

## 2015-02-22 HISTORY — DX: Major depressive disorder, single episode, unspecified: F32.9

## 2015-02-22 HISTORY — DX: Reserved for inherently not codable concepts without codable children: IMO0001

## 2015-02-22 HISTORY — DX: Myoneural disorder, unspecified: G70.9

## 2015-02-22 HISTORY — DX: Hypothyroidism, unspecified: E03.9

## 2015-02-22 HISTORY — DX: Gastro-esophageal reflux disease without esophagitis: K21.9

## 2015-02-22 HISTORY — DX: Depression, unspecified: F32.A

## 2015-02-22 SURGERY — LEFT HEART CATH AND CORONARY ANGIOGRAPHY
Anesthesia: Moderate Sedation | Laterality: Bilateral

## 2015-02-22 SURGERY — LEFT HEART CATH AND CORONARY ANGIOGRAPHY
Anesthesia: Moderate Sedation

## 2015-02-22 MED ORDER — NITROGLYCERIN 0.4 MG SL SUBL
SUBLINGUAL_TABLET | SUBLINGUAL | Status: DC | PRN
  Administered 2015-02-22: .4 mg via SUBLINGUAL

## 2015-02-22 MED ORDER — ACETAMINOPHEN 325 MG PO TABS: 650.0000 mg | ORAL_TABLET | ORAL | Status: DC | PRN

## 2015-02-22 MED ORDER — SODIUM CHLORIDE 0.9 % IV SOLN: INTRAVENOUS | Status: DC

## 2015-02-22 MED ORDER — MIDAZOLAM HCL 2 MG/2ML IJ SOLN
INTRAMUSCULAR | Status: AC
  Filled 2015-02-22: qty 2

## 2015-02-22 MED ORDER — NITROGLYCERIN 0.4 MG SL SUBL
SUBLINGUAL_TABLET | SUBLINGUAL | Status: AC
  Filled 2015-02-22: qty 1

## 2015-02-22 MED ORDER — IOHEXOL 300 MG/ML  SOLN
INTRAMUSCULAR | Status: DC | PRN
  Administered 2015-02-22: 80 mL via INTRA_ARTERIAL
  Administered 2015-02-22: 30 mL via INTRA_ARTERIAL

## 2015-02-22 MED ORDER — ONDANSETRON HCL 4 MG/2ML IJ SOLN: 4.0000 mg | Freq: Four times a day (QID) | INTRAMUSCULAR | Status: DC | PRN

## 2015-02-22 MED ORDER — SODIUM CHLORIDE 0.9 % IJ SOLN
3.0000 mL | Freq: Two times a day (BID) | INTRAMUSCULAR | Status: DC
  Administered 2015-02-22: 3 mL via INTRAVENOUS

## 2015-02-22 MED ORDER — MIDAZOLAM HCL 2 MG/2ML IJ SOLN
INTRAMUSCULAR | Status: DC | PRN
  Administered 2015-02-22: 1 mg via INTRAVENOUS

## 2015-02-22 MED ORDER — HEPARIN (PORCINE) IN NACL 2-0.9 UNIT/ML-% IJ SOLN
INTRAMUSCULAR | Status: AC
  Filled 2015-02-22: qty 1000

## 2015-02-22 MED ORDER — OXYCODONE-ACETAMINOPHEN 5-325 MG PO TABS: 1.0000 | ORAL_TABLET | ORAL | Status: DC | PRN

## 2015-02-22 MED ORDER — FENTANYL CITRATE (PF) 100 MCG/2ML IJ SOLN
INTRAMUSCULAR | Status: AC
  Filled 2015-02-22: qty 2

## 2015-02-22 MED ORDER — ASPIRIN 81 MG PO CHEW: 81.0000 mg | CHEWABLE_TABLET | ORAL | Status: DC

## 2015-02-22 MED ORDER — MORPHINE SULFATE (PF) 2 MG/ML IV SOLN: 1.0000 mg | INTRAVENOUS | Status: DC | PRN

## 2015-02-22 MED ORDER — SODIUM CHLORIDE 0.9 % IV SOLN
INTRAVENOUS | Status: DC
  Administered 2015-02-22: 10:00:00 via INTRAVENOUS

## 2015-02-22 MED ORDER — FENTANYL CITRATE (PF) 100 MCG/2ML IJ SOLN
INTRAMUSCULAR | Status: DC | PRN
  Administered 2015-02-22: 50 ug via INTRAVENOUS

## 2015-02-22 MED ORDER — SODIUM CHLORIDE 0.9 % WEIGHT BASED INFUSION: 3.0000 mL/kg/h | INTRAVENOUS | Status: DC

## 2015-02-22 SURGICAL SUPPLY — 10 items
CATH INFINITI 5FR ANG PIGTAIL (CATHETERS) ×3 IMPLANT
CATH INFINITI 5FR JL4 (CATHETERS) ×3 IMPLANT
CATH INFINITI JR4 5F (CATHETERS) ×3 IMPLANT
DEVICE CLOSURE MYNXGRIP 5F (Vascular Products) ×3 IMPLANT
KIT MANI 3VAL PERCEP (MISCELLANEOUS) ×3 IMPLANT
NEEDLE PERC 18GX7CM (NEEDLE) ×3 IMPLANT
NEEDLE SMART 18G ACCESS (NEEDLE) ×3 IMPLANT
PACK CARDIAC CATH (CUSTOM PROCEDURE TRAY) ×3 IMPLANT
SHEATH AVANTI 5FR X 11CM (SHEATH) ×3 IMPLANT
WIRE EMERALD 3MM-J .035X150CM (WIRE) ×3 IMPLANT

## 2015-02-22 NOTE — Discharge Instructions (Signed)

## 2015-02-22 NOTE — Addendum Note (Signed)
Addendum  created 02/22/15 1449 by Iver Nestle, MD   Modules edited: Anesthesia Attestations

## 2015-02-27 ENCOUNTER — Telehealth: Payer: Self-pay | Admitting: Internal Medicine

## 2015-02-27 ENCOUNTER — Encounter
Admission: RE | Disposition: A | Discharge status: Home / Self Care | Payer: Self-pay | Source: Ambulatory Visit | Attending: Gastroenterology

## 2015-02-27 ENCOUNTER — Ambulatory Visit
Admission: RE | Admit: 2015-02-27 | Discharge: 2015-02-27 | Disposition: A | Discharge status: Home / Self Care | Payer: Medicare Other | Source: Ambulatory Visit | Attending: Gastroenterology | Admitting: Gastroenterology

## 2015-02-27 DIAGNOSIS — K2270 Barrett's esophagus without dysplasia: Secondary | ICD-10-CM | POA: Diagnosis present

## 2015-02-27 DIAGNOSIS — R1310 Dysphagia, unspecified: Secondary | ICD-10-CM | POA: Insufficient documentation

## 2015-02-27 DIAGNOSIS — Z79899 Other long term (current) drug therapy: Secondary | ICD-10-CM | POA: Diagnosis not present

## 2015-02-27 DIAGNOSIS — K449 Diaphragmatic hernia without obstruction or gangrene: Secondary | ICD-10-CM | POA: Insufficient documentation

## 2015-02-27 HISTORY — PX: ESOPHAGEAL MANOMETRY: SHX5429

## 2015-02-27 SURGERY — MANOMETRY, ESOPHAGUS

## 2015-02-27 MED ORDER — LIDOCAINE HCL 2 % EX GEL
CUTANEOUS | Status: AC
  Administered 2015-02-27: 3
  Filled 2015-02-27: qty 5

## 2015-02-27 MED ORDER — LIDOCAINE HCL 2 % EX GEL
1.0000 "application " | Freq: Once | CUTANEOUS | Status: AC
  Administered 2015-02-27: 3

## 2015-02-27 MED ORDER — BUTAMBEN-TETRACAINE-BENZOCAINE 2-2-14 % EX AERO
1.0000 | INHALATION_SPRAY | Freq: Once | CUTANEOUS | Status: AC
  Administered 2015-02-27: 1 via TOPICAL

## 2015-02-27 MED ORDER — BUTAMBEN-TETRACAINE-BENZOCAINE 2-2-14 % EX AERO
INHALATION_SPRAY | CUTANEOUS | Status: AC
  Administered 2015-02-27: 1 via TOPICAL
  Filled 2015-02-27: qty 20

## 2015-02-27 SURGICAL SUPPLY — 2 items
FACESHIELD LNG OPTICON STERILE (SAFETY) IMPLANT
GLOVE BIO SURGEON STRL SZ8 (GLOVE) ×6 IMPLANT

## 2015-02-27 NOTE — Telephone Encounter (Signed)
He is needing prior auth for ??

## 2015-02-27 NOTE — Telephone Encounter (Signed)
OK. The other medications listed would not be adequate. We will need to do PA

## 2015-02-27 NOTE — Telephone Encounter (Signed)
Spoke with Jerel Shepherd Pharmacist at [employer], she states pts plan offers coverage on the following medications without PA:  Pravastatin, Simvastatin, Atorvastatin, Rosuvastatin.  Please advise if either of these meds are appropriate for pt.

## 2015-02-27 NOTE — Telephone Encounter (Signed)
Lajuana Carry called from [facility] regarding pt medication needing authorization. [phone] please call [phone]. Thank You!

## 2015-02-27 NOTE — Telephone Encounter (Signed)
Zetia, sorry I left that out.

## 2015-02-27 NOTE — Telephone Encounter (Signed)
[facility] is faxing form to be completed.

## 2015-02-28 ENCOUNTER — Ambulatory Visit (INDEPENDENT_AMBULATORY_CARE_PROVIDER_SITE_OTHER): Payer: Medicare Other | Admitting: Internal Medicine

## 2015-02-28 ENCOUNTER — Encounter: Payer: Self-pay | Admitting: Internal Medicine

## 2015-02-28 VITALS — BP 105/66 | HR 65 | Temp 97.8°F | Ht 72.0 in | Wt 171.1 lb

## 2015-02-28 DIAGNOSIS — R0600 Dyspnea, unspecified: Secondary | ICD-10-CM

## 2015-02-28 DIAGNOSIS — E785 Hyperlipidemia, unspecified: Secondary | ICD-10-CM | POA: Diagnosis not present

## 2015-02-28 DIAGNOSIS — K2270 Barrett's esophagus without dysplasia: Secondary | ICD-10-CM | POA: Diagnosis not present

## 2015-02-28 DIAGNOSIS — Z23 Encounter for immunization: Secondary | ICD-10-CM | POA: Diagnosis not present

## 2015-02-28 NOTE — Assessment & Plan Note (Signed)
Reviewed recent EGD. Follow up with GI pending. Esophageal manometry pending. Follow up here in 2-3 months.

## 2015-02-28 NOTE — Assessment & Plan Note (Signed)
Unable to tolerate statins. Insurance has refused coverage of Zetia. PA in place.

## 2015-02-28 NOTE — Assessment & Plan Note (Signed)
Recent dyspnea on exertion. Reviewed recent cardiac cath which was normal with normal coronaries and normal EF. Esophageal manometry pending. Reviewed EGD which showed no findings to explain dyspnea. Will set up pulmonary evaluation with PFTs.

## 2015-02-28 NOTE — Progress Notes (Signed)
Pre visit review using our clinic review tool, if applicable. No additional management support is needed unless otherwise documented below in the visit note. 

## 2015-02-28 NOTE — Patient Instructions (Signed)
We will set up evaluation with pulmonary medicine.

## 2015-02-28 NOTE — Progress Notes (Signed)
Subjective:    Patient ID: Timothy Turner, male    DOB: 1940-10-20, 74 y.o.   MRN: [id number]  HPI  [name] presents for follow up.  Recently seen for dyspnea on exertion. Cardiac cath performed 9/2 showed no significant coronary disease. EF 55%. Also had EGD performed yesterday and biopsy results pending.  Continues to have shortness of breath with exertion. Last over 2 months Having trouble swallowing solid foods. EGD showed no obstruction. Drinking Ensure. Esophageal manometry completed yesterday, results pending. Follow up with GI next week.  Timothy Turner continues to feel that food is getting caught in upper esophagus, particularly hard dry foods such as bread or meats. Sometimes, this leads to vomiting.   Wt Readings from Last 3 Encounters:  02/28/15 171 lb 2 oz (77.622 kg)  02/21/15 170 lb 12 oz (77.452 kg)  02/18/15 172 lb (78.019 kg)     Past Medical History  Diagnosis Date  . Heart murmur     History of  . Mitral valve disease   . Hypertension   . Hemorrhoids   . Psoriasis   . Thyroiditis   . Rheumatic heart valve incompetence   . Pain syndrome, chronic     Severe  . Myalgia     Multiple  . Gallstones   . Barrett esophagus   . Neuromuscular disorder   . COPD (chronic obstructive pulmonary disease)   . Shortness of breath dyspnea   . Pneumonia     three times, last time >10 years ago  . Hypothyroidism   . GERD (gastroesophageal reflux disease)   . Depression    Family History  Problem Relation Age of Onset  . Heart failure Mother   . Heart failure Father   . Cancer Sister   . Aneurysm Sister     Brain   Past Surgical History  Procedure Laterality Date  . Back surgery    . Hemorroidectomy    . Upper gi endoscopy  03-06-2013    Dr Donnella Sham  . Colonoscopy  03-08-12    Dr Donnella Sham  . Cholecystectomy  08/03/13  . Esophagogastroduodenoscopy (egd) with propofol N/A 02/18/2015    Procedure: ESOPHAGOGASTRODUODENOSCOPY (EGD) WITH PROPOFOL;  Surgeon: Lollie Sails, MD;  Location: [facility] ENDOSCOPY;  Service: Endoscopy;  Laterality: N/A;  . Cardiac catheterization N/A 02/22/2015    Procedure: Left Heart Cath and Coronary Angiography;  Surgeon: Minna Merritts, MD;  Location: [place] [facility];  Service: Cardiovascular;  Laterality: N/A;   Social History   Social History  . Marital Status: Married    Spouse Name: N/A  . Number of Children: 2  . Years of Education: N/A   Occupational History  . 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 Smoking status: Former Smoker    Quit date: 06/22/2004  . Smokeless tobacco: Never Used  . Alcohol Use: No  . Drug Use: No  . Sexual Activity: Not Asked   Other Topics Concern  . None   Social History Narrative   Drinks 4-5 cups of coffee a day and 3-4 cans of soda a day.    Review of Systems  Constitutional: Negative for fever, chills, activity change, appetite change, fatigue and unexpected weight change.  Eyes: Negative for visual disturbance.  Respiratory: Positive for shortness of breath. Negative for cough, chest tightness and wheezing.   Cardiovascular: Negative for chest pain, palpitations and leg swelling.  Gastrointestinal: Negative  for vomiting, abdominal pain, diarrhea, constipation and abdominal distention.  Genitourinary: Negative for dysuria, urgency and difficulty urinating.  Musculoskeletal: Negative for arthralgias and gait problem.  Skin: Negative for color change and rash.  Hematological: Negative for adenopathy.  Psychiatric/Behavioral: Negative for sleep disturbance and dysphoric mood. The patient is not nervous/anxious.        Objective:    BP 105/66 mmHg  Pulse 65  Temp(Src) 97.8 F (36.6 C) (Oral)  Ht 6' (1.829 m)  Wt 171 lb 2 oz (77.622 kg)  BMI 23.20 kg/m2  SpO2 97% Physical Exam  Constitutional: Timothy Turner is oriented to person, place, and time. Timothy Turner appears well-developed and well-nourished. No distress.  HENT:    Head: Normocephalic and atraumatic.  Right Ear: External ear normal.  Left Ear: External ear normal.  Nose: Nose normal.  Mouth/Throat: Oropharynx is clear and moist. No oropharyngeal exudate.  Eyes: Conjunctivae and EOM are normal. Pupils are equal, round, and reactive to light. Right eye exhibits no discharge. Left eye exhibits no discharge. No scleral icterus.  Neck: Normal range of motion. Neck supple. No tracheal deviation present. No thyromegaly present.  Cardiovascular: Normal rate, regular rhythm and normal heart sounds.  Exam reveals no gallop and no friction rub.   No murmur heard. Pulmonary/Chest: Effort normal and breath sounds normal. No accessory muscle usage. No tachypnea. No respiratory distress. Timothy Turner has no decreased breath sounds. Timothy Turner has no wheezes. Timothy Turner has no rhonchi. Timothy Turner has no rales. Timothy Turner exhibits no tenderness.  Musculoskeletal: Normal range of motion. Timothy Turner exhibits no edema.  Lymphadenopathy:    Timothy Turner has no cervical adenopathy.  Neurological: Timothy Turner is alert and oriented to person, place, and time. No cranial nerve deficit. Coordination normal.  Skin: Skin is warm and dry. No rash noted. Timothy Turner is not diaphoretic. No erythema. No pallor.  Psychiatric: Timothy Turner has a normal mood and affect. His behavior is normal. Judgment and thought content normal.          Assessment & Plan:   Problem List Items Addressed This Visit      Unprioritized   Barrett esophagus    Reviewed recent EGD. Follow up with GI pending. Esophageal manometry pending. Follow up here in 2-3 months.      Dyspnea - Primary    Recent dyspnea on exertion. Reviewed recent cardiac cath which was normal with normal coronaries and normal EF. Esophageal manometry pending. Reviewed EGD which showed no findings to explain dyspnea. Will set up pulmonary evaluation with PFTs.      Relevant Orders   Ambulatory referral to Pulmonology   Hyperlipidemia    Unable to tolerate statins. Insurance has refused coverage of Zetia. PA  in place.          Return in about 3 months (around 05/30/2015) for Recheck.

## 2015-03-01 ENCOUNTER — Encounter: Payer: Self-pay | Admitting: Gastroenterology

## 2015-03-04 DIAGNOSIS — Z0279 Encounter for issue of other medical certificate: Secondary | ICD-10-CM

## 2015-03-07 ENCOUNTER — Other Ambulatory Visit: Payer: Self-pay | Admitting: Internal Medicine

## 2015-03-07 DIAGNOSIS — M791 Myalgia, unspecified site: Secondary | ICD-10-CM

## 2015-03-07 MED ORDER — CLONAZEPAM 1 MG PO TABS: 1.5000 mg | ORAL_TABLET | Freq: Three times a day (TID) | ORAL | Status: DC | PRN

## 2015-03-11 ENCOUNTER — Other Ambulatory Visit: Payer: Self-pay | Admitting: *Deleted

## 2015-03-11 ENCOUNTER — Telehealth: Payer: Self-pay | Admitting: Cardiovascular Disease

## 2015-03-11 ENCOUNTER — Telehealth: Payer: Self-pay | Admitting: *Deleted

## 2015-03-11 MED ORDER — COLESEVELAM HCL 625 MG PO TABS: ORAL_TABLET | ORAL | Status: DC

## 2015-03-11 NOTE — Telephone Encounter (Signed)
, °

## 2015-03-11 NOTE — Telephone Encounter (Signed)
[facility] called to confirm med refill   1. Which medications need to be refilled?    Welchol 625 mg 3 tab twice daily   2. [facility]. Do they need a 30 day or 90 day supply?90  4. Would they like a call back once the medication has been sent to the pharmacy?please call patient to confirm

## 2015-03-13 ENCOUNTER — Telehealth: Payer: Self-pay | Admitting: *Deleted

## 2015-03-13 NOTE — Telephone Encounter (Signed)
Certainly his choice, typically we do a post cardiac catheterization follow-up Will defer to him

## 2015-03-13 NOTE — Telephone Encounter (Signed)
He was given a 1 mo f/u after his cath.  Do you want him to keep this appt?

## 2015-03-13 NOTE — Telephone Encounter (Signed)
Pt calling asking if he needs to keep his Monday apt, he just had cath done and was not sure if we needed to see him again.  Please call patient.

## 2015-03-14 NOTE — Telephone Encounter (Signed)
Spoke w/ pt's wife.  Advised him of Dr. Donivan Scull recommendation.  Pt is not there, she will have him call if he does not plan to keep his appt.

## 2015-03-15 ENCOUNTER — Telehealth: Payer: Self-pay | Admitting: *Deleted

## 2015-03-15 ENCOUNTER — Ambulatory Visit (INDEPENDENT_AMBULATORY_CARE_PROVIDER_SITE_OTHER): Payer: Medicare Other | Admitting: Internal Medicine

## 2015-03-15 ENCOUNTER — Encounter: Payer: Self-pay | Admitting: Internal Medicine

## 2015-03-15 VITALS — BP 118/70 | HR 60 | Ht 72.0 in | Wt 173.0 lb

## 2015-03-15 DIAGNOSIS — R0600 Dyspnea, unspecified: Secondary | ICD-10-CM | POA: Diagnosis not present

## 2015-03-15 NOTE — Assessment & Plan Note (Signed)
-  patient has no acute dyspnea at this time-his previous dyspnea seemed to have resolved with clonezepam -signs and symptoms of aspiration of solids and liquids-advised aspiration precautions -will obtain PFT's at next visit -flu shot taken last several weeks

## 2015-03-15 NOTE — Telephone Encounter (Signed)
Chastity called states Zetia is no longer covered by pts plan.  Recommending pt try alternative such as Simvastatin, Pravastatin, Atorvastatin or  Rouvastatin.  Please advise   reference # [id number]

## 2015-03-15 NOTE — Progress Notes (Signed)
[place] [facility] [facility] Consultation      Date: 03/15/2015,   MRN# [id number] Timothy Turner 1941/03/04 Code Status:  Hosp day:@LENGTHOFSTAYDAYS @ Referring MD: @ATDPROV @     PCP:      AdmissionWeight: 173 lb (78.472 kg)                 CurrentWeight: 173 lb (78.472 kg) Timothy Turner is a 74 y.o. old male seen in consultation for dyspnea, patient noted to have h/o COPD.    CHIEF COMPLAINT:   Dyspnea for several months   HISTORY OF PRESENT ILLNESS  74 yo white male who has multiple medical issues seen today for intermittent SOb and DOE that has been going on for several months.  Patient has had signs and symptoms of aspiration of solids and liquids and has been followed by Dr. Gustavo Lah.  Dr Gilford Rile has prescribed Clonezepam and that has pretty much resolved his swallowing difficulty Patient s/p barium swallow and assessment by speech therapist-patient has been given specific instructions by speech therapist  Patient has occasional SOB and increased WOB when doing strnious activity such as cutting wood. Patient has no acute resp or carciac issues at this time There is no evidence of pneumonia at this time.  Patient is a former smoker-smoked 1 ppd for 10 years from age [age]. then 1 ppd for 20 years from age [age] Patient carries a dx of COPD but is not in any inhalers, patient states that he had PFT's done at [facility] 20 years ago.   All other ROS negative    PAST MEDICAL HISTORY   Past Medical History  Diagnosis Date  . Heart murmur     History of  . Mitral valve disease   . Hypertension   . Hemorrhoids   . Psoriasis   . Thyroiditis   . Rheumatic heart valve incompetence   . Pain syndrome, chronic     Severe  . Myalgia     Multiple  . Gallstones   . Barrett esophagus   . Neuromuscular disorder   . Shortness of breath dyspnea   . Pneumonia     three times, last time >10 years ago  . Hypothyroidism   . GERD (gastroesophageal reflux  disease)   . Depression      SURGICAL HISTORY   Past Surgical History  Procedure Laterality Date  . Back surgery    . Hemorroidectomy    . Upper gi endoscopy  03-06-2013    Dr Donnella Sham  . Colonoscopy  03-08-12    Dr Donnella Sham  . Cholecystectomy  08/03/13  . Esophagogastroduodenoscopy (egd) with propofol N/A 02/18/2015    Procedure: ESOPHAGOGASTRODUODENOSCOPY (EGD) WITH PROPOFOL;  Surgeon: Lollie Sails, MD;  Location: [facility] ENDOSCOPY;  Service: Endoscopy;  Laterality: N/A;  . Cardiac catheterization N/A 02/22/2015    Procedure: Left Heart Cath and Coronary Angiography;  Surgeon: Minna Merritts, MD;  Location: [facility] CV LAB;  Service: Cardiovascular;  Laterality: N/A;  . Esophageal manometry N/A 02/27/2015    Procedure: ESOPHAGEAL MANOMETRY (EM);  Surgeon: Lollie Sails, MD;  Location: [facility] ENDOSCOPY;  Service: Endoscopy;  Laterality: N/A;     FAMILY HISTORY   Family History  Problem Relation Age of Onset  . Heart failure Mother   . Heart failure Father   . Cancer Sister   . Aneurysm Sister     Brain     SOCIAL HISTORY   Social History  Substance Use Topics  . Smoking status: Former Smoker --  1.00 packs/day for 30 years    Quit date: 06/22/2004  . Smokeless tobacco: Never Used  . Alcohol Use: No     MEDICATIONS    Home Medication:  Current Outpatient Rx  Name  Route  Sig  Dispense  Refill  . calcipotriene (DOVONEX) 0.005 % cream   Topical   Apply topically as needed.          . celecoxib (CELEBREX) 200 MG capsule   Oral   Take 1 capsule (200 mg total) by mouth 2 (two) times daily.   180 capsule   1   . cetirizine (ZYRTEC) 10 MG tablet   Oral   Take 10 mg by mouth as needed.          . Cholecalciferol (VITAMIN D HIGH POTENCY) 1000 UNITS capsule   Oral   Take 1,000 Units by mouth daily. Take one tablet by mouth two times a day.          . clobetasol (TEMOVATE) 0.05 % cream   Topical   Apply topically as needed.          .  clonazePAM (KLONOPIN) 1 MG tablet   Oral   Take 1.5 tablets (1.5 mg total) by mouth 3 (three) times daily as needed (severe muscle spasm).   180 tablet   3   . colesevelam (WELCHOL) 625 MG tablet      TAKE 3 TABLETS TWICE DAILY WITH MEALS (NEED TO CONTACT OFFICE TO SCHEDULE FUTURE APPOINTMENT AND REFILLS)   540 tablet   3   . escitalopram (LEXAPRO) 10 MG tablet   Oral   Take 1 tablet (10 mg total) by mouth daily.   90 tablet   3   . esomeprazole (NEXIUM) 40 MG capsule   Oral   Take 40 mg by mouth daily before breakfast.           . ezetimibe (ZETIA) 10 MG tablet   Oral   Take 1 tablet (10 mg total) by mouth daily.   90 tablet   1   . finasteride (PROSCAR) 5 MG tablet   Oral   Take 5 mg by mouth daily.           . Glucosamine-Chondroitin (OSTEO BI-FLEX REGULAR STRENGTH) 250-200 MG TABS   Oral   Take by mouth. Take one tablet daily.          [name] levothyroxine (SYNTHROID, LEVOTHROID) 75 MCG tablet      TAKE 1 TABLET DAILY   90 tablet   3   . loratadine (CLARITIN) 10 MG tablet   Oral   Take 10 mg by mouth as needed.          . Magnesium 250 MG TABS   Oral   Take by mouth.         . metoprolol tartrate (LOPRESSOR) 25 MG tablet   Oral   Take 25 mg by mouth 2 (two) times daily as needed. As needed for palpitations         . Multiple Vitamins-Minerals (CENTRUM SILVER PO)   Oral   Take by mouth.           . Omega-3 Fatty Acids (FISH OIL) 1000 MG CAPS   Oral   Take 1,000 mg by mouth. Take two tablets daily.          . Testosterone 30 MG/ACT SOLN   Transdermal   Place onto the skin. Axiron  Current Medication:  Current outpatient prescriptions:  .  calcipotriene (DOVONEX) 0.005 % cream, Apply topically as needed. , Disp: , Rfl:  .  celecoxib (CELEBREX) 200 MG capsule, Take 1 capsule (200 mg total) by mouth 2 (two) times daily., Disp: 180 capsule, Rfl: 1 .  cetirizine (ZYRTEC) 10 MG tablet, Take 10 mg by mouth as needed. , Disp: ,  Rfl:  .  Cholecalciferol (VITAMIN D HIGH POTENCY) 1000 UNITS capsule, Take 1,000 Units by mouth daily. Take one tablet by mouth two times a day. , Disp: , Rfl:  .  clobetasol (TEMOVATE) 0.05 % cream, Apply topically as needed. , Disp: , Rfl:  .  clonazePAM (KLONOPIN) 1 MG tablet, Take 1.5 tablets (1.5 mg total) by mouth 3 (three) times daily as needed (severe muscle spasm)., Disp: 180 tablet, Rfl: 3 .  colesevelam (WELCHOL) 625 MG tablet, TAKE 3 TABLETS TWICE DAILY WITH MEALS (NEED TO CONTACT OFFICE TO SCHEDULE FUTURE APPOINTMENT AND REFILLS), Disp: 540 tablet, Rfl: 3 .  escitalopram (LEXAPRO) 10 MG tablet, Take 1 tablet (10 mg total) by mouth daily., Disp: 90 tablet, Rfl: 3 .  esomeprazole (NEXIUM) 40 MG capsule, Take 40 mg by mouth daily before breakfast.  , Disp: , Rfl:  .  ezetimibe (ZETIA) 10 MG tablet, Take 1 tablet (10 mg total) by mouth daily., Disp: 90 tablet, Rfl: 1 .  finasteride (PROSCAR) 5 MG tablet, Take 5 mg by mouth daily.  , Disp: , Rfl:  .  Glucosamine-Chondroitin (OSTEO BI-FLEX REGULAR STRENGTH) 250-200 MG TABS, Take by mouth. Take one tablet daily. , Disp: , Rfl:  .  levothyroxine (SYNTHROID, LEVOTHROID) 75 MCG tablet, TAKE 1 TABLET DAILY, Disp: 90 tablet, Rfl: 3 .  loratadine (CLARITIN) 10 MG tablet, Take 10 mg by mouth as needed. , Disp: , Rfl:  .  Magnesium 250 MG TABS, Take by mouth., Disp: , Rfl:  .  metoprolol tartrate (LOPRESSOR) 25 MG tablet, Take 25 mg by mouth 2 (two) times daily as needed. As needed for palpitations, Disp: , Rfl:  .  Multiple Vitamins-Minerals (CENTRUM SILVER PO), Take by mouth.  , Disp: , Rfl:  .  Omega-3 Fatty Acids (FISH OIL) 1000 MG CAPS, Take 1,000 mg by mouth. Take two tablets daily. , Disp: , Rfl:  .  Testosterone 30 MG/ACT SOLN, Place onto the skin. Axiron, Disp: , Rfl:     ALLERGIES   Cymbalta; Rosuvastatin; and Statins     REVIEW OF SYSTEMS   Review of Systems  Constitutional: Negative for fever, chills, weight loss,  malaise/fatigue and diaphoresis.  HENT: Negative for congestion and hearing loss.   Eyes: Negative for blurred vision and double vision.  Respiratory: Negative for cough, shortness of breath and wheezing.   Cardiovascular: Negative for chest pain, palpitations and orthopnea.  Gastrointestinal: Positive for heartburn. Negative for nausea, vomiting, abdominal pain, diarrhea, constipation and blood in stool.  Genitourinary: Negative for dysuria and urgency.  Musculoskeletal: Negative for myalgias, back pain and neck pain.  Skin: Negative for rash.  Neurological: Negative for dizziness, weakness and headaches.  All other systems reviewed and are negative.    VS: BP 118/70 mmHg  Pulse 60  Ht 6' (1.829 m)  Wt 173 lb (78.472 kg)  BMI 23.46 kg/m2  SpO2 98%     PHYSICAL EXAM  Physical Exam  Constitutional: He is oriented to person, place, and time. He appears well-developed and well-nourished. No distress.  HENT:  Head: Normocephalic and atraumatic.  Mouth/Throat: No oropharyngeal exudate.  Eyes: EOM are normal. Pupils are equal, round, and reactive to light. No scleral icterus.  Neck: Normal range of motion. Neck supple.  Cardiovascular: Normal rate, regular rhythm and normal heart sounds.   No murmur heard. Pulmonary/Chest: No stridor. No respiratory distress. He has no wheezes.  Abdominal: Soft. Bowel sounds are normal. He exhibits no distension. There is no tenderness. There is no rebound.  Musculoskeletal: Normal range of motion. He exhibits no edema.  Neurological: He is alert and oriented to person, place, and time. He displays normal reflexes. Coordination normal.  Skin: Skin is warm. He is not diaphoretic.  Psychiatric: He has a normal mood and affect.        LABS    No results for input(s): HGB, HCT, MCV, WBC, POTASSIUM, CHLORIDE, BUN, CREATININE, GLUCOSE, CALCIUM, INR, PTT in the last 72 hours.  Invalid input(s): PLATELET, BANDS, NEUTROPHIL, LYMPHOCYTE, MONOCYTE,  EOSINOPHILS, BASOPHIL, SODIUM, BICARBONATE, MAGNESIUM, PHOSPHORUS, PT, SGPT, SGOT,    No results for input(s): PH in the last 72 hours.  Invalid input(s): PCO2, PO2, BASEEXCESS, BASEDEFICITE, TFT    CULTURE RESULTS   No results found for this or any previous visit (from the past 240 hour(s)).        IMAGING    No results found.        ASSESSMENT/PLAN   74 yo white male with intermittent dyspnea with sign and symptoms of aspiration with probable underlying COPD   Dyspnea -patient has no acute dyspnea at this time-his previous dyspnea seemed to have resolved with clonezepam -signs and symptoms of aspiration of solids and liquids-advised aspiration precautions -will obtain PFT's at next visit -flu shot taken last several weeks   No need for inhalers at this time, patient asymptomatic at this time. Will follow up in 3 months after PFT's completed   I have personally obtained a history, examined the patient, evaluated laboratory and independently reviewed imaging results, formulated the assessment and plan and placed orders.  The Patient requires high complexity decision making for assessment and support, frequent evaluation and titration of therapies, application of advanced monitoring technologies and extensive interpretation of multiple databases. Time spent with patient 40 minutes.  Patient is satisfied with Plan of action and management.    Corrin Parker, M.D.  [facility]  Medical Director [place] Director [facility] Cardio-Pulmonary Department

## 2015-03-15 NOTE — Patient Instructions (Signed)

## 2015-03-15 NOTE — Telephone Encounter (Signed)
These are not acceptable alternatives. He has not been able to tolerate statins.

## 2015-03-18 ENCOUNTER — Telehealth: Payer: Self-pay | Admitting: *Deleted

## 2015-03-18 ENCOUNTER — Ambulatory Visit: Payer: Medicare Other | Admitting: Cardiovascular Disease

## 2015-03-18 NOTE — Telephone Encounter (Signed)
[facility] calling stating that Colesevelam is not covered anymore for patient.  Would like to know another alternative.  Please advise

## 2015-03-18 NOTE — Telephone Encounter (Signed)
Can we see if "colestipol" is covered on his insurance

## 2015-03-18 NOTE — Telephone Encounter (Signed)
Please advise 

## 2015-03-19 ENCOUNTER — Other Ambulatory Visit: Payer: Self-pay | Admitting: Cardiovascular Disease

## 2015-03-19 MED ORDER — COLESTIPOL HCL 1 G PO TABS
3.0000 g | ORAL_TABLET | Freq: Two times a day (BID) | ORAL | Status: DC
Start: 2015-03-19 — End: 2022-02-19

## 2015-03-19 NOTE — Telephone Encounter (Signed)
I have sent in a prescription for colestipol Would start 1 pill twice a day for several weeks, Then increase up to 2 pills twice a day for several weeks Then up to 3 pills twice a day

## 2015-03-19 NOTE — Telephone Encounter (Signed)
[facility] calling stating that yes the medication below is covered But would like to know how much and what kind, they are running out of time on this order Please advise.

## 2015-03-20 NOTE — Telephone Encounter (Signed)
Spoke w/ pt.  Advised him of Dr. Gollan's recommendation.  He verbalizes understanding and will call back w/ any further questions or concerns.  

## 2015-03-25 ENCOUNTER — Other Ambulatory Visit: Payer: Self-pay | Admitting: Internal Medicine

## 2015-03-26 NOTE — Telephone Encounter (Signed)
Spoke with [employer], with [facility], states that medication was sent to pt and prior authorization was approved.   If any issues use Case ID #[id number]

## 2015-03-27 ENCOUNTER — Ambulatory Visit (INDEPENDENT_AMBULATORY_CARE_PROVIDER_SITE_OTHER): Payer: Medicare Other | Admitting: Internal Medicine

## 2015-03-27 DIAGNOSIS — R0600 Dyspnea, unspecified: Secondary | ICD-10-CM

## 2015-03-27 LAB — PULMONARY FUNCTION TEST
DL/VA % PRED: 81 %
DL/VA: 3.83 ml/min/mmHg/L
DLCO UNC % PRED: 77 %
DLCO UNC: 27.02 ml/min/mmHg
FEF 25-75 POST: 3.29 L/s
FEF 25-75 PRE: 2.18 L/s
FEF2575-%Change-Post: 51 %
FEF2575-%PRED-PRE: 84 %
FEF2575-%Pred-Post: 128 %
FEV1-%Change-Post: 10 %
FEV1-%PRED-POST: 97 %
FEV1-%Pred-Pre: 88 %
FEV1-Post: 3.35 L
FEV1-Pre: 3.04 L
FEV1FVC-%Change-Post: 3 %
FEV1FVC-%PRED-PRE: 99 %
FEV6-%CHANGE-POST: 7 %
FEV6-%PRED-PRE: 92 %
FEV6-%Pred-Post: 99 %
FEV6-POST: 4.4 L
FEV6-Pre: 4.11 L
FEV6FVC-%Change-Post: 0 %
FEV6FVC-%PRED-POST: 105 %
FEV6FVC-%Pred-Pre: 104 %
FVC-%Change-Post: 6 %
FVC-%PRED-PRE: 88 %
FVC-%Pred-Post: 94 %
FVC-POST: 4.44 L
FVC-PRE: 4.17 L
POST FEV6/FVC RATIO: 99 %
PRE FEV1/FVC RATIO: 73 %
Post FEV1/FVC ratio: 76 %
Pre FEV6/FVC Ratio: 98 %
RV % pred: 116 %
RV: 3.03 L
TLC % PRED: 103 %
TLC: 7.72 L

## 2015-03-27 NOTE — Progress Notes (Signed)
SMW performed today. 

## 2015-04-01 ENCOUNTER — Telehealth: Payer: Self-pay | Admitting: *Deleted

## 2015-04-01 NOTE — Telephone Encounter (Signed)
Patient called and he is wanting the results from 6 min walk and pft.

## 2015-04-01 NOTE — Telephone Encounter (Signed)
Pt is asking about his PFT and SMW results. Please advise.

## 2015-04-08 NOTE — Telephone Encounter (Signed)
Please advise on results for the PFT and SMW.

## 2015-04-09 ENCOUNTER — Telehealth: Payer: Self-pay | Admitting: Cardiovascular Disease

## 2015-04-09 NOTE — Telephone Encounter (Signed)
Attempted to schedule fu from recall list.  Patient wife says patient is not interested in fu at this time.  Patient agreed to call in the future if needed.     Deleted recall

## 2015-04-10 ENCOUNTER — Telehealth: Payer: Self-pay | Admitting: *Deleted

## 2015-04-10 NOTE — Telephone Encounter (Signed)
Pt calling asking for results on labs and tests he did.  He did a MRI results states he needs this by Wednesday  Please advise

## 2015-04-11 NOTE — Telephone Encounter (Signed)
Please advise on results. Thanks.

## 2015-04-12 NOTE — Telephone Encounter (Signed)
i can only see PFT's which show MILD COPD  dont know about his labs..they are not recent He does not have an MRI  Last CXR in august shows evidence of COPD

## 2015-04-12 NOTE — Telephone Encounter (Signed)
Pt informed of results and states the MRI was a mistake. That was for another doctor. Nothing further needed.

## 2015-04-15 ENCOUNTER — Other Ambulatory Visit: Payer: Self-pay | Admitting: Neurology

## 2015-04-15 DIAGNOSIS — R1312 Dysphagia, oropharyngeal phase: Secondary | ICD-10-CM

## 2015-04-18 ENCOUNTER — Ambulatory Visit
Admission: RE | Admit: 2015-04-18 | Discharge: 2015-04-18 | Disposition: A | Discharge status: Home / Self Care | Payer: Medicare Other | Source: Ambulatory Visit | Attending: Neurology | Admitting: Neurology

## 2015-04-18 DIAGNOSIS — R490 Dysphonia: Secondary | ICD-10-CM | POA: Diagnosis present

## 2015-04-18 DIAGNOSIS — R1312 Dysphagia, oropharyngeal phase: Secondary | ICD-10-CM | POA: Insufficient documentation

## 2015-04-18 MED ORDER — GADOBENATE DIMEGLUMINE 529 MG/ML IV SOLN
20.0000 mL | Freq: Once | INTRAVENOUS | Status: AC | PRN
  Administered 2015-04-18: 16 mL via INTRAVENOUS

## 2015-04-30 ENCOUNTER — Other Ambulatory Visit: Payer: Self-pay | Admitting: *Deleted

## 2015-04-30 ENCOUNTER — Ambulatory Visit (INDEPENDENT_AMBULATORY_CARE_PROVIDER_SITE_OTHER): Payer: Medicare Other | Admitting: Internal Medicine

## 2015-04-30 ENCOUNTER — Encounter: Payer: Self-pay | Admitting: Internal Medicine

## 2015-04-30 VITALS — BP 125/73 | HR 61 | Temp 97.8°F | Ht 72.0 in | Wt 174.2 lb

## 2015-04-30 DIAGNOSIS — R0600 Dyspnea, unspecified: Secondary | ICD-10-CM

## 2015-04-30 DIAGNOSIS — R1312 Dysphagia, oropharyngeal phase: Secondary | ICD-10-CM

## 2015-04-30 MED ORDER — EZETIMIBE 10 MG PO TABS: 10.0000 mg | ORAL_TABLET | Freq: Every day | ORAL | Status: DC

## 2015-04-30 MED ORDER — PANTOPRAZOLE SODIUM 40 MG PO TBEC: 40.0000 mg | DELAYED_RELEASE_TABLET | Freq: Two times a day (BID) | ORAL | Status: DC

## 2015-04-30 NOTE — Assessment & Plan Note (Signed)
Pt reports continued dyspnea with exertion. Exam today normal. Pulmonary and cardiac evaluation essentially normal. GI evaluation normal, however waiting on most recent visit notes. Lab evaluation normal. Will set up neurology evaluation for Parkinson's. Follow up in 4 weeks.

## 2015-04-30 NOTE — Progress Notes (Signed)
Subjective:    Patient ID: Timothy Turner, male    DOB: [birth year], 74 y.o.   MRN: [id number]  HPI  [name] presents for follow up.  Last seen 9/8 for dyspnea. Cardiac evaluation was normal. Pulmonary evaluation was pending at that time. Six min walk test and PFTs were performed. These were normal. Also seen by neurology for dysphagia. Had MRI brain performed which was normal.  Continues to have trouble swallowing. Seen by GI and neurology. Plan was to be seen at [facility]. Has to puree foods. Unable to eat foods such as chicken, steak.   Told by neurology he may have MG or Parkinsons. Lab evaluation for MG normal. Feels that gait is off at times. Feels dizzy at times. Continues to have some dyspnea on exertion. Has not started back walking.   Wt Readings from Last 3 Encounters:  04/30/15 174 lb 4 oz (79.039 kg)  03/15/15 173 lb (78.472 kg)  02/28/15 171 lb 2 oz (77.622 kg)   BP Readings from Last 3 Encounters:  04/30/15 125/73  03/15/15 118/70  02/28/15 105/66    Past Medical History  Diagnosis Date  . Heart murmur     History of  . Mitral valve disease   . Hypertension   . Hemorrhoids   . Psoriasis   . Thyroiditis   . Rheumatic heart valve incompetence   . Pain syndrome, chronic     Severe  . Myalgia     Multiple  . Gallstones   . Barrett esophagus   . Neuromuscular disorder ([place])   . Shortness of breath dyspnea   . Pneumonia     three times, last time >10 years ago  . Hypothyroidism   . GERD (gastroesophageal reflux disease)   . Depression    Family History  Problem Relation Age of Onset  . Heart failure Mother   . Heart failure Father   . Cancer Sister   . Aneurysm Sister     Brain   Past Surgical History  Procedure Laterality Date  . Back surgery    . Hemorroidectomy    . Upper gi endoscopy  03-06-2013    Dr Donnella Sham  . Colonoscopy  03-08-12    Dr Donnella Sham  . Cholecystectomy  08/03/13  . Esophagogastroduodenoscopy (egd) with propofol N/A  02/18/2015    Procedure: ESOPHAGOGASTRODUODENOSCOPY (EGD) WITH PROPOFOL;  Surgeon: Lollie Sails, MD;  Location: [facility] ENDOSCOPY;  Service: Endoscopy;  Laterality: N/A;  . Cardiac catheterization N/A 02/22/2015    Procedure: Left Heart Cath and Coronary Angiography;  Surgeon: Minna Merritts, MD;  Location: [place] CV LAB;  Service: Cardiovascular;  Laterality: N/A;  . Esophageal manometry N/A 02/27/2015    Procedure: ESOPHAGEAL MANOMETRY (EM);  Surgeon: Lollie Sails, MD;  Location: [facility] ENDOSCOPY;  Service: Endoscopy;  Laterality: N/A;   Social History   Social History  . Marital Status: Married    Spouse Name: N/A  . Number of Children: 2  . Years of Education: N/A   Occupational History  . 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 Smoking status: Former Smoker -- 1.00 packs/day for 30 years    Quit date: 06/22/2004  . Smokeless tobacco: Never Used  . Alcohol Use: No  . Drug Use: No  . Sexual Activity: Not Asked   Other Topics Concern  . None   Social History Narrative   Drinks 4-5 cups of coffee  a day and 3-4 cans of soda a day.    Review of Systems  Constitutional: Positive for fatigue. Negative for fever, chills, activity change, appetite change and unexpected weight change.  HENT: Positive for trouble swallowing.   Eyes: Negative for visual disturbance.  Respiratory: Positive for choking and shortness of breath. Negative for cough.   Cardiovascular: Negative for chest pain, palpitations and leg swelling.  Gastrointestinal: Negative for nausea, vomiting, abdominal pain, diarrhea, constipation and abdominal distention.  Genitourinary: Negative for dysuria, urgency and difficulty urinating.  Musculoskeletal: Positive for gait problem. Negative for myalgias and arthralgias.  Skin: Negative for color change and rash.  Neurological: Positive for dizziness and weakness. Negative for light-headedness and headaches.    Hematological: Negative for adenopathy.  Psychiatric/Behavioral: Negative for suicidal ideas, sleep disturbance and dysphoric mood. The patient is not nervous/anxious.        Objective:    BP 125/73 mmHg  Pulse 61  Temp(Src) 97.8 F (36.6 C) (Oral)  Ht 6' (1.829 m)  Wt 174 lb 4 oz (79.039 kg)  BMI 23.63 kg/m2  SpO2 96% Physical Exam  Constitutional: He is oriented to person, place, and time. He appears well-developed and well-nourished. No distress.  HENT:  Head: Normocephalic and atraumatic.  Right Ear: External ear normal.  Left Ear: External ear normal.  Nose: Nose normal.  Mouth/Throat: Oropharynx is clear and moist. No oropharyngeal exudate.  Eyes: Conjunctivae and EOM are normal. Pupils are equal, round, and reactive to light. Right eye exhibits no discharge. Left eye exhibits no discharge. No scleral icterus.  Neck: Normal range of motion. Neck supple. No tracheal deviation present. No thyromegaly present.  Cardiovascular: Normal rate, regular rhythm and normal heart sounds.  Exam reveals no gallop and no friction rub.   No murmur heard. Pulmonary/Chest: Effort normal and breath sounds normal. No accessory muscle usage. No tachypnea. No respiratory distress. He has no decreased breath sounds. He has no wheezes. He has no rhonchi. He has no rales. He exhibits no tenderness.  Musculoskeletal: Normal range of motion. He exhibits no edema.  Lymphadenopathy:    He has no cervical adenopathy.  Neurological: He is alert and oriented to person, place, and time. No cranial nerve deficit. Coordination normal.  Skin: Skin is warm and dry. No rash noted. He is not diaphoretic. No erythema. No pallor.  Psychiatric: He has a normal mood and affect. His behavior is normal. Judgment and thought content normal.          Assessment & Plan:   Problem List Items Addressed This Visit      Unprioritized   Dysphagia, oropharyngeal phase - Primary    Fairly rapid progression of  dysphagia over last few months. No clear etiology. Neurology evaluation including lab evaluation for MG normal. MRI brain normal. Question Parkinson's however unusual presentation with lack of tremor. Will set up evaluation with Dr. Carles Collet. Follow up here in 4 weeks.      Relevant Orders   Ambulatory referral to Neurology   Dyspnea    Pt reports continued dyspnea with exertion. Exam today normal. Pulmonary and cardiac evaluation essentially normal. GI evaluation normal, however waiting on most recent visit notes. Lab evaluation normal. Will set up neurology evaluation for Parkinson's. Follow up in 4 weeks.          Return in about 4 weeks (around 05/28/2015) for Recheck.

## 2015-04-30 NOTE — Progress Notes (Signed)
Pre visit review using our clinic review tool, if applicable. No additional management support is needed unless otherwise documented below in the visit note. 

## 2015-04-30 NOTE — Patient Instructions (Signed)
We will set up evaluation with Dr. Carles Collet in Movement Disorders.  Follow up in 4 weeks.

## 2015-04-30 NOTE — Assessment & Plan Note (Signed)
Fairly rapid progression of dysphagia over last few months. No clear etiology. Neurology evaluation including lab evaluation for MG normal. MRI brain normal. Question Parkinson's however unusual presentation with lack of tremor. Will set up evaluation with Dr. Carles Collet. Follow up here in 4 weeks.

## 2015-04-30 NOTE — Progress Notes (Signed)
Sounds good.  Did he have an EMG?

## 2015-05-01 ENCOUNTER — Encounter: Payer: Self-pay | Admitting: Neurology

## 2015-05-01 ENCOUNTER — Other Ambulatory Visit: Payer: Self-pay | Admitting: Neurology

## 2015-05-01 ENCOUNTER — Other Ambulatory Visit (INDEPENDENT_AMBULATORY_CARE_PROVIDER_SITE_OTHER): Payer: Medicare Other

## 2015-05-01 ENCOUNTER — Telehealth: Payer: Self-pay | Admitting: Internal Medicine

## 2015-05-01 ENCOUNTER — Ambulatory Visit (INDEPENDENT_AMBULATORY_CARE_PROVIDER_SITE_OTHER): Payer: Medicare Other | Admitting: Neurology

## 2015-05-01 VITALS — BP 126/80 | HR 68 | Ht 72.0 in | Wt 175.0 lb

## 2015-05-01 DIAGNOSIS — R251 Tremor, unspecified: Secondary | ICD-10-CM | POA: Diagnosis not present

## 2015-05-01 DIAGNOSIS — R1310 Dysphagia, unspecified: Secondary | ICD-10-CM | POA: Diagnosis not present

## 2015-05-01 DIAGNOSIS — H532 Diplopia: Secondary | ICD-10-CM

## 2015-05-01 DIAGNOSIS — W19XXXA Unspecified fall, initial encounter: Secondary | ICD-10-CM

## 2015-05-01 DIAGNOSIS — R0602 Shortness of breath: Secondary | ICD-10-CM

## 2015-05-01 DIAGNOSIS — R292 Abnormal reflex: Secondary | ICD-10-CM

## 2015-05-01 LAB — PHOSPHORUS: Phosphorus: 3.3 mg/dL (ref 2.3–4.6)

## 2015-05-01 LAB — CALCIUM: Calcium: 9.7 mg/dL (ref 8.4–10.5)

## 2015-05-01 LAB — TSH: TSH: 0.1 u[IU]/mL — ABNORMAL LOW (ref 0.35–4.50)

## 2015-05-01 NOTE — Telephone Encounter (Signed)
Patient called to inform Dr. Gilford Rile that he will follow up with her within a month after seeing Dr. Carles Collet.

## 2015-05-01 NOTE — Progress Notes (Signed)
Timothy Turner was seen today in neurologic consultation at the request of Rica Mast, MD.   Prior records that were made available to me were reviewed.  The patient reports that he began to have dysphagia and increasing dyspnea around February, 2016.  He has seen and been evaluated by multiple specialties for this.  He has been followed by GI since 2014 for Barrett's esophagus.  He followed back up with them and an EGD was done and did not reveal any stricture. He states that biopsies were done but I don't personally have results of that (he states negative). He saw [facility] in September, 2016 for shortness of breath.  No etiology was identified, and at that point in time the patient reported to them that he felt somewhat better with clonazepam.  He had a cardiac catheterization for dyspnea on exertion on 02/22/2015.  This was normal.  He underwent a modified barium swallow on 01/25/2015.  There was possible trace penetration with thin liquids, but was otherwise reported to be normal.  Despite this, the patient states that he cannot even eat soft foods, let alone more solid foods and he has to pure everything because of dysphagia.  He is eating some baby food.    He was seen by Dr. Manuella Ghazi in [place] on 04/05/2015.  He ordered an MRI of the brain, which was completed on 04/22/2015.  I did review this personally.  This was done with and without gadolinium and was normal.  A B12 level was ordered and was 717.  Acetylcholine receptor antibodies were negative.  Pt states that balance is off, mostly when he bends over or is on uneven surfaces.  Had a "bad" fall about 5 weeks ago.  He was hanging a light outside and he stood on a table to do that; when he stepped off the table, he tripped over a tool on the table and he fell and hit the door and had rib fx's.  He states that before this all started, he had no trouble negotiating stairs but now he is having trouble, particularly down the  stairs.  "Before this started I would walk an hour a day and that would be 6 laps.  The day I quit walking it took me 32 minutes to walk one lap."  States that had to quit because of SOB.  No diplopia.  No ptosis. He is not currently on statins; states that he was seen at [facility] and had a muscle biopsy and told to never take a statin again.  Had an EMG back then but nothing since then.     ALLERGIES:   Allergies  Allergen Reactions  . Cymbalta [Duloxetine Hcl]     Unable to urinate  . Rosuvastatin   . Statins     CURRENT MEDICATIONS:  Outpatient Encounter Prescriptions as of 05/01/2015  Medication Sig  . calcipotriene (DOVONEX) 0.005 % cream Apply topically as needed.   . celecoxib (CELEBREX) 200 MG capsule Take 1 capsule (200 mg total) by mouth 2 (two) times daily.  . cetirizine (ZYRTEC) 10 MG tablet Take 10 mg by mouth as needed.   . Cholecalciferol (VITAMIN D HIGH POTENCY) 1000 UNITS capsule Take 1,000 Units by mouth daily. Take one tablet by mouth two times a day.   . clobetasol (TEMOVATE) 0.05 % cream Apply topically as needed.   . clonazePAM (KLONOPIN) 1 MG tablet Take 1.5 tablets (1.5 mg total) by mouth 3 (three) times daily as needed (severe muscle spasm).  [name]  colesevelam (WELCHOL) 625 MG tablet TAKE 3 TABLETS TWICE DAILY WITH MEALS (NEED TO CONTACT OFFICE TO SCHEDULE FUTURE APPOINTMENT AND REFILLS)  . colestipol (COLESTID) 1 G tablet Take 3 tablets (3 g total) by mouth 2 (two) times daily.  [name] escitalopram (LEXAPRO) 10 MG tablet Take 1 tablet (10 mg total) by mouth daily.  [name] ezetimibe (ZETIA) 10 MG tablet Take 1 tablet (10 mg total) by mouth daily.  . finasteride (PROSCAR) 5 MG tablet Take 5 mg by mouth daily.    . Glucosamine-Chondroitin (OSTEO BI-FLEX REGULAR STRENGTH) 250-200 MG TABS Take by mouth. Take one tablet daily.   [name] levothyroxine (SYNTHROID, LEVOTHROID) 75 MCG tablet TAKE 1 TABLET DAILY  . loratadine (CLARITIN) 10 MG tablet Take 10 mg by mouth as needed.   . Magnesium 250  MG TABS Take by mouth.  . metoprolol tartrate (LOPRESSOR) 25 MG tablet Take 25 mg by mouth 2 (two) times daily as needed. As needed for palpitations  . Multiple Vitamins-Minerals (CENTRUM SILVER PO) Take by mouth.    . Omega-3 Fatty Acids (FISH OIL) 1000 MG CAPS Take 1,000 mg by mouth. Take two tablets daily.   . pantoprazole (PROTONIX) 40 MG tablet Take 1 tablet (40 mg total) by mouth 2 (two) times daily.  . Testosterone 30 MG/ACT SOLN Place onto the skin. Axiron   No facility-administered encounter medications on file as of 05/01/2015.    PAST MEDICAL HISTORY:   Past Medical History  Diagnosis Date  . Heart murmur     History of  . Mitral valve disease   . Hypertension   . Hemorrhoids   . Psoriasis   . Thyroiditis   . Rheumatic heart valve incompetence   . Pain syndrome, chronic     Severe  . Myalgia     Multiple  . Gallstones   . Barrett esophagus   . Neuromuscular disorder ([place])   . Shortness of breath dyspnea   . Pneumonia     three times, last time >10 years ago  . Hypothyroidism   . GERD (gastroesophageal reflux disease)   . Depression     PAST SURGICAL HISTORY:   Past Surgical History  Procedure Laterality Date  . Back surgery    . Hemorroidectomy    . Upper gi endoscopy  03-06-2013    Dr Donnella Sham  . Colonoscopy  03-08-12    Dr Donnella Sham  . Cholecystectomy  08/03/13  . Esophagogastroduodenoscopy (egd) with propofol N/A 02/18/2015    Procedure: ESOPHAGOGASTRODUODENOSCOPY (EGD) WITH PROPOFOL;  Surgeon: Lollie Sails, MD;  Location: [facility] ENDOSCOPY;  Service: Endoscopy;  Laterality: N/A;  . Cardiac catheterization N/A 02/22/2015    Procedure: Left Heart Cath and Coronary Angiography;  Surgeon: Minna Merritts, MD;  Location: [facility] LAB;  Service: Cardiovascular;  Laterality: N/A;  . Esophageal manometry N/A 02/27/2015    Procedure: ESOPHAGEAL MANOMETRY (EM);  Surgeon: Lollie Sails, MD;  Location: [facility] ENDOSCOPY;  Service: Endoscopy;  Laterality: N/A;     SOCIAL HISTORY:   Social History   Social History  . Marital Status: Married    Spouse Name: N/A  . Number of Children: 2  . Years of Education: N/A   Occupational History  . 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 Smoking status: Former Smoker -- 1.00 packs/day for 30 years    Quit date: 06/22/2004  . Smokeless tobacco: Never Used  . Alcohol Use: No  .  Drug Use: No  . Sexual Activity: Not on file   Other Topics Concern  . Not on file   Social History Narrative   Drinks 4-5 cups of coffee a day and 3-4 cans of soda a day.    FAMILY HISTORY:   Family Status  Relation Status Death Age  . Mother Deceased 83    CHF  . Father Deceased 68    CHF  . Sister Deceased 6    cancer  . Brother Deceased 57    brain tumor  . Sister Deceased 31    aneurysm of the brain  . Sister Alive     healthy  . Sister Alive     heart disease  . Brother Alive     HEART DISEASE    ROS:  The patient reports that he has lost about 20 pounds since the beginning of this in February, although when I look back at records he weight 182 in 08/2013 and weighs 175 today.  A complete 10 system review of systems was obtained and was unremarkable apart from what is mentioned above.  PHYSICAL EXAMINATION:    VITALS:   Filed Vitals:   05/01/15 0827  BP: 126/80  Pulse: 68  Height: 6' (1.829 m)  Weight: 175 lb (79.379 kg)    Patient was undressed and placed into examining shorts for the examination.  GEN:  Normal appears male in no acute distress.  Appears stated age. HEENT:  Normocephalic, atraumatic. The mucous membranes are moist. The superficial temporal arteries are without ropiness or tenderness. Cardiovascular: Regular rate and rhythm. Lungs: Clear to auscultation bilaterally. Neck/Heme: There are no carotid bruits noted bilaterally.  NEUROLOGICAL: Orientation:  The patient is alert and oriented x 3.  Fund of knowledge is  appropriate.  Recent and remote memory intact.  Attention span and concentration normal.  Repeats and names without difficulty. Cranial nerves: There is good facial symmetry. The pupils are equal round and reactive to light bilaterally. Fundoscopic exam reveals clear disc margins bilaterally. Extraocular muscles are intact and visual fields are full to confrontational testing.  There is no ptosis.  Speech is fluent and clear. Soft palate rises symmetrically and there is no tongue deviation. Hearing is intact to conversational tone. Tone: There is mild right upper extremity rigidity. Sensation: Sensation is intact to light touch and pinprick throughout (facial, trunk, extremities). Vibration is intact at the bilateral big toe. There is no extinction with double simultaneous stimulation. There is no sensory dermatomal level identified. Coordination:  The patient has no difficulty with RAM's or FNF bilaterally. Motor: Strength is 5/5 in the bilateral upper and lower extremities.  Shoulder shrug is equal and symmetric. There is no pronator drift.  There are no fasciculations noted, including in the tongue. DTR's: Deep tendon reflexes are 2+/4 at the bilateral biceps, triceps, brachioradialis, 3/4 at the bilateral patella and achilles.  There is no ankle clonus.  Plantar responses are downgoing bilaterally. Gait and Station: The patient is able to ambulate without difficulty.  He does have decreased arm swing on the right and fairly significantly so. Abnormal movements: There is a mild right upper extremity resting tremor with distraction.  There is a chin tremor.  He does unaware of either of these.    Lab Results  Component Value Date   TSH 3.18 12/19/2013   Labs to be ordered:  Labs: Tsh, SPEP and UPEP with immunofixation, calcium, phosphorous, PTH, Copper, Lyme, MuSK   IMPRESSION/PLAN  1. Dysphagia and shortness  of breath  -His acetylcholine receptor antibodies were negative.  I would like to  schedule an EMG with rep stimulation along with musk antibodies, if his insurance will pay for that.  I did not see evidence of motor neuron disease on his examination, although he is hyperreflexic.  No fasciculations were noted.  -His swallow study (mild dysphagia and only with thin liquids) is very different than his perception of the dysphagia (primarily solids and eating baby food/pureing food).  He apparently has had a GI evaluation with biopsies.  I do not have those biopsy results, but it would certainly be interesting to see if he had eosinophilic esophagitis.  This is certainly out of my area of expertise and I would leave that to his other specialists. 2.. Tremor  -As above, the patient is unaware of this.  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  However, he otherwise does not have significant bradykinesia.  It would also be unusual for idiopathic Parkinson's disease to present as dysphagia.  Some of the atypical states can have more significant dysphagia, but it is usually not the presenting complaint.  It would also be very unusual for parkinsonism to cause this degree of shortness of breath that he is describing.  We will go ahead and proceed with a dat scan at [facility]. 3.  Hyperreflexia  -His MRI of the brain was negative.  We will do an MRI of the cervical spine. 4.  Further recommendations will follow the above testing.  Much greater than 50% of this visit was spent in counseling with the patient and coordinating care.  Total face to face time:  60 min

## 2015-05-01 NOTE — Patient Instructions (Addendum)
1. We will call you with an appt for your EMG.  2. Your provider has requested that you have labwork completed today. Please go to [facility] on the second floor of this building before leaving the office today. You do not need to check in. If you are not called within 15 minutes please check with the front desk.  3. We are referring you to [facility] for a DaT scan. They will contact you directly to set up a time for this testing. If you do not hear from them they can be contacted at [phone]. 4. We have scheduled you at [facility] for your MRI on 05/15/2015 at 11:00 am. Please arrive 15 minutes prior. If you need to reschedule for any reason please call [phone]. 5. [facility] lab request sent to [facility]. They will run your insurance and contact you about price of testing.

## 2015-05-02 ENCOUNTER — Telehealth: Payer: Self-pay | Admitting: Neurology

## 2015-05-02 ENCOUNTER — Other Ambulatory Visit (INDEPENDENT_AMBULATORY_CARE_PROVIDER_SITE_OTHER): Payer: Medicare Other

## 2015-05-02 DIAGNOSIS — R1310 Dysphagia, unspecified: Secondary | ICD-10-CM | POA: Diagnosis not present

## 2015-05-02 LAB — PARATHYROID HORMONE, INTACT (NO CA): PTH: 38 pg/mL (ref 14–64)

## 2015-05-02 LAB — T4, FREE: Free T4: 1.35 ng/dL (ref 0.60–1.60)

## 2015-05-02 LAB — LYME AB/WESTERN BLOT REFLEX: B BURGDORFERI AB IGG+ IGM: 0.17 {ISR}

## 2015-05-02 NOTE — Telephone Encounter (Signed)
Spoke with [facility] and added Free T4 to order. They will get this resulted.

## 2015-05-02 NOTE — Telephone Encounter (Signed)
-----   Message from [place], DO sent at 05/02/2015  7:38 AM EST ----- Luvenia Starch, will you call lab and see if they can add a Free T4.  Dr. Gilford Rile needs that

## 2015-05-02 NOTE — Addendum Note (Signed)
Addended byAnnamaria Helling on: 05/02/2015 08:22 AM   Modules accepted: Orders

## 2015-05-03 LAB — UIFE/LIGHT CHAINS/TP QN, 24-HR UR
Albumin, U: DETECTED
Alpha 1, Urine: DETECTED — AB
Alpha 2, Urine: DETECTED — AB
Beta, Urine: DETECTED — AB
Gamma Globulin, Urine: DETECTED — AB
TOTAL PROTEIN, URINE-UPE24: 8 mg/dL (ref 5–25)

## 2015-05-03 LAB — SPEP & IFE WITH QIG
ALBUMIN ELP: 4 g/dL (ref 3.8–4.8)
Alpha-1-Globulin: 0.3 g/dL (ref 0.2–0.3)
Alpha-2-Globulin: 0.8 g/dL (ref 0.5–0.9)
BETA 2: 0.3 g/dL (ref 0.2–0.5)
BETA GLOBULIN: 0.4 g/dL (ref 0.4–0.6)
GAMMA GLOBULIN: 0.8 g/dL (ref 0.8–1.7)
IGG (IMMUNOGLOBIN G), SERUM: 773 mg/dL (ref 650–1600)
IgA: 166 mg/dL (ref 68–379)
IgM, Serum: 24 mg/dL — ABNORMAL LOW (ref 41–251)
TOTAL PROTEIN, SERUM ELECTROPHOR: 6.6 g/dL (ref 6.1–8.1)

## 2015-05-04 LAB — COPPER, SERUM: Copper: 95 ug/dL (ref 70–175)

## 2015-05-10 ENCOUNTER — Telehealth: Payer: Self-pay | Admitting: Neurology

## 2015-05-10 NOTE — Telephone Encounter (Signed)
Left message on machine for patient to call back. To discuss him stopping Lexapro for 8 days prior to DAT scan at [facility]. Awaiting call back.

## 2015-05-10 NOTE — Telephone Encounter (Signed)
Patient made aware to stop Lexapro 8 days prior to scan.

## 2015-05-13 ENCOUNTER — Telehealth: Payer: Self-pay | Admitting: Internal Medicine

## 2015-05-13 NOTE — Telephone Encounter (Signed)
No. That level would indicate thyroid function on the high side. We can plan to recheck in 4-6 weeks.

## 2015-05-13 NOTE — Telephone Encounter (Signed)
Pt called to stated he was seen by Dr. Carles Collet and had blood work done. Pt TSH came back 0.10 and want know he need to increase his dosage. Please advise pt/msn

## 2015-05-13 NOTE — Telephone Encounter (Signed)
Spoke with patient he verbalized understanding

## 2015-05-13 NOTE — Telephone Encounter (Signed)
Please advise 

## 2015-05-14 ENCOUNTER — Other Ambulatory Visit: Payer: Medicare Other

## 2015-05-15 ENCOUNTER — Ambulatory Visit
Admission: RE | Admit: 2015-05-15 | Discharge: 2015-05-15 | Disposition: A | Discharge status: Home / Self Care | Payer: Medicare Other | Source: Ambulatory Visit | Attending: Neurology | Admitting: Neurology

## 2015-05-15 DIAGNOSIS — R1310 Dysphagia, unspecified: Secondary | ICD-10-CM | POA: Insufficient documentation

## 2015-05-15 DIAGNOSIS — W19XXXA Unspecified fall, initial encounter: Secondary | ICD-10-CM | POA: Diagnosis not present

## 2015-05-15 DIAGNOSIS — M5022 Other cervical disc displacement, mid-cervical region, unspecified level: Secondary | ICD-10-CM | POA: Insufficient documentation

## 2015-05-15 DIAGNOSIS — R292 Abnormal reflex: Secondary | ICD-10-CM | POA: Diagnosis present

## 2015-05-15 DIAGNOSIS — M5382 Other specified dorsopathies, cervical region: Secondary | ICD-10-CM | POA: Diagnosis not present

## 2015-05-15 DIAGNOSIS — R251 Tremor, unspecified: Secondary | ICD-10-CM | POA: Insufficient documentation

## 2015-05-20 ENCOUNTER — Telehealth: Payer: Self-pay | Admitting: Neurology

## 2015-05-20 NOTE — Telephone Encounter (Signed)
-----   Message from [place], DO sent at 05/20/2015  7:39 AM EST ----- Reviewed.  No significant stenosis.  Luvenia Starch, you can let pt know that nothing on cervical spine MR to explain dysphagia

## 2015-05-20 NOTE — Telephone Encounter (Signed)
Patient made aware of results.   He had [facility] lab drawn and shipped off but he has not gotten results yet.

## 2015-05-23 ENCOUNTER — Telehealth: Payer: Self-pay | Admitting: Neurology

## 2015-05-23 NOTE — Telephone Encounter (Signed)
You can let pt know that his DaT scan from [facility] was reported to be normal

## 2015-05-23 NOTE — Telephone Encounter (Signed)
Patient made aware Dat scan normal per [facility].

## 2015-05-24 ENCOUNTER — Ambulatory Visit (INDEPENDENT_AMBULATORY_CARE_PROVIDER_SITE_OTHER): Payer: Medicare Other | Admitting: Neurology

## 2015-05-24 ENCOUNTER — Telehealth: Payer: Self-pay | Admitting: Neurology

## 2015-05-24 DIAGNOSIS — R292 Abnormal reflex: Secondary | ICD-10-CM

## 2015-05-24 DIAGNOSIS — W19XXXA Unspecified fall, initial encounter: Secondary | ICD-10-CM

## 2015-05-24 DIAGNOSIS — R251 Tremor, unspecified: Secondary | ICD-10-CM | POA: Diagnosis not present

## 2015-05-24 DIAGNOSIS — R0602 Shortness of breath: Secondary | ICD-10-CM

## 2015-05-24 DIAGNOSIS — R1310 Dysphagia, unspecified: Secondary | ICD-10-CM

## 2015-05-24 DIAGNOSIS — M5417 Radiculopathy, lumbosacral region: Secondary | ICD-10-CM

## 2015-05-24 NOTE — Telephone Encounter (Signed)
Patient made aware of results.  

## 2015-05-24 NOTE — Procedures (Signed)
[facility]  [address], [address]  [place], [place] [address] Tel: [phone] Fax:  [phone] Test Date:  05/24/2015  Patient: Timothy Turner DOB: Oct 03, 1940 Physician: Narda Amber, DO  Sex: Male Height: 6\' 0"  Ref Phys: Alonza Bogus, M.D.  ID#: [id number] Temp: 35.0C Technician: Jerilynn Mages. Dean   Patient Complaints: This is a 74 year old gentleman referred for evaluation of difficulty swallowing and shortness of breath.  NCV & EMG Findings: Extensive electrodiagnostic testing of the right upper extremity, right lower extremity and additional repetitive nerve stimulation of the spinal accessory, median, and peroneal nerves shows the following: 1. Right median and sural sensory responses are within normal limits. 2. Right median motor response shows prolonged latency and significantly reduced amplitude; however, there is a marked motor response when stimulating at the ulnar wrist and recording at the abductor pollicis brevis is consistent with a Martin-Gruber anastomosis. Right ulnar and peroneal motor responses are within normal limits. 3. Repetitive nerve stimulation of the spinal accessory, median, and peroneal nerves recorded at the trapezius, abductor pollicis brevis, and anterior tibialis muscles, respectively, is within normal limitsand without evidence of decrement. 4. There is no evidence of active or chronic motor axon loss changes affecting any of the muscles of the upper extremity or bulbar muscles (hyoglossus and mentalis). 5. Chronic motor axon loss changes are seen affecting the right tibialis anterior and gluteus medius muscles, without accompanied active denervation.   Impression: 1. 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 2. Incidentally, there is a prominent right Martin-Gruber anastomosis, a normal variant. 3. There is no evidence of a neuromuscular junction disorder, diffuse myopathy, disorder of anterior  horn cells, or sensorimotor polyneuropathy.    ___________________________ Narda Amber, DO    Nerve Conduction Studies Anti Sensory Summary Table   Site NR Peak (ms) Norm Peak (ms) P-T Amp (V) Norm P-T Amp  Right Median Anti Sensory (2nd Digit)  35C  Wrist    3.7 <3.8 13.0 >10  Right Sural Anti Sensory (Lat Mall)  Calf    2.7 <4.6 6.4 >3   Motor Summary Table   Site NR Onset (ms) Norm Onset (ms) O-P Amp (mV) Norm O-P Amp Site1 Site2 Delta-0 (ms) Dist (cm) Vel (m/s) Norm Vel (m/s)  Right Median Motor (Abd Poll Brev)  35C  Wrist    5.1 <4.0 0.9 >5 Elbow Wrist 6.1 31.0 51 >50  Elbow    11.2  0.9  Ulnar-wrist crossover Elbow 6.7 0.0    Ulnar-wrist crossover    4.5  7.8  Ulnar-el6ow Ulnar-wrist crossover  0.0    Ulnar-elbow NR            Right Peroneal Motor (Ext Dig Brev)  35C  Ankle    4.3 <6.0 5.7 >2.5 B Fib Ankle 8.4 34.5 41 >40  B Fib    12.7  4.9  Poplt B Fib 1.8 10.0 56 >40  Poplt    14.5  4.9         Right Peroneal TA Motor (Tib Ant)  35C  Fib Head    3.5 <4.5 4.3 >3 Poplit Fib Head 1.7 10.0 59 >40  Poplit    5.2  4.2          EMG   Side Muscle Ins Act Fibs Psw Fasc Number Recrt Dur Dur. Amp Amp. Poly Poly. Comment  Right AntTibialis Nml Nml Nml Nml 1- Rapid Some 1+ Some 1+ Nml Nml N/A  Right 1stDorInt Nml Nml  Nml Nml Nml Nml Nml Nml Nml Nml Nml Nml N/A  Right FlexPolLong Nml Nml Nml Nml Nml Nml Nml Nml Nml Nml Nml Nml N/A  Right BrachioRad Nml Nml Nml Nml Nml Nml Nml Nml Nml Nml Nml Nml N/A  Right PronatorTeres Nml Nml Nml Nml Nml Nml Nml Nml Nml Nml Nml Nml N/A  Right Biceps Nml Nml Nml Nml Nml Nml Nml Nml Nml Nml Nml Nml N/A  Right Triceps Nml Nml Nml Nml Nml Nml Nml Nml Nml Nml Nml Nml N/A  Right Deltoid Nml Nml Nml Nml Nml Nml Nml Nml Nml Nml Nml Nml N/A  Right GluteusMed Nml Nml Nml Nml 1- Rapid Some 1+ Some 1+ Nml Nml N/A  Right BicepsFemS Nml Nml Nml Nml Nml Nml Nml Nml Nml Nml Nml Nml N/A  Right RectFemoris Nml Nml Nml Nml Nml Nml Nml Nml Nml Nml Nml  Nml N/A  Right Gastroc Nml Nml Nml Nml Nml Nml Nml Nml Nml Nml Nml Nml N/A  Right Mentalis Nml Nml Nml Nml Nml Nml Nml Nml Nml Nml Nml Nml N/A  Right Hyoglossus Nml Nml Nml Nml Nml Nml Nml Nml Nml Nml Nml Nml N/A   RNS   Trial # Label Amp 1 (mV)  O-P Amp 5 (mV)  O-P Amp % Dif Area 1 (mVms) Area 5 (mVms) Area % Dif Rep Rate Train Length Pause Time (min:sec) Comments  Right AntTibialis  Tr 1 Baseline 4.03 4.06 0.5 16.18 15.96 -1.3 3.00 10 00:30   Tr 2 Post Exercise 4.72 5.01 6.2 22.85 23.95 4.8 3.00 10 01:00   Tr 3 1 Min Post 4.90 4.97 1.4 23.72 21.38 -9.9 3.00 10 01:00   Tr 4 2 Min Post 5.01 4.94 -1.4 21.19 20.24 -4.5 3.00 10 01:00   Tr 5 3 Min Post 4.98 4.80 -3.8 21.01 19.38 -7.8 3.00 10 00:00   Right Abd Poll Brev  Tr 1 Baseline 0.80 0.77 -3.2 3.54 3.38 -4.4 3.00 10 00:30   Tr 2 Post Exercise 0.77 0.75 -2.3 3.29 3.13 -4.9 3.00 10 01:00   Tr 3 1 Min Post 0.75 0.73 -3.0 3.34 3.10 -7.4 3.00 10 01:00   Tr 4 2 Min Post 0.75 0.71 -5.2 3.39 3.12 -8.2 3.00 10 01:00   Tr 5 3 Min Post 0.69 0.71 2.8 3.08 3.11 1.0 3.00 10 00:00   Right Trapezius  Tr 1 Baseline 5.27 5.42 3.0 33.56 32.75 -2.4 3.00 10 00:30   Tr 3 Post Exercise 5.31 5.59 5.4 33.47 33.43 -0.1 3.00 10 01:00   Tr 4 1 Min Post 5.36 5.49 2.4 34.02 33.18 -2.5 3.00 10 01:00   Tr 5 2 Min Post 5.46 5.42 -0.7 34.65 33.01 -4.7 3.00 10 01:00   Tr 6 3 Min Post 5.16 5.19 0.7 32.58 31.58 -3.1 3.00 10 00:00   Act:         3.00 10 00:00   7        3.00 10 00:00              Waveforms:

## 2015-05-24 NOTE — Telephone Encounter (Signed)
-----   Message from [place], DO sent at 05/24/2015  2:41 PM EST ----- Let pt know that EMG looked good besides mild pinched nerve on R leg.  Now that he has had very extensive testing, I cannot think of anything else neurologic that would cause his dysphagia.

## 2015-05-27 ENCOUNTER — Ambulatory Visit: Payer: Medicare Other | Admitting: Internal Medicine

## 2015-05-28 ENCOUNTER — Encounter: Payer: Self-pay | Admitting: Internal Medicine

## 2015-05-28 ENCOUNTER — Ambulatory Visit (INDEPENDENT_AMBULATORY_CARE_PROVIDER_SITE_OTHER): Payer: Medicare Other | Admitting: Internal Medicine

## 2015-05-28 VITALS — BP 131/68 | HR 61 | Temp 97.9°F | Ht 72.0 in | Wt 173.0 lb

## 2015-05-28 DIAGNOSIS — M791 Myalgia, unspecified site: Secondary | ICD-10-CM

## 2015-05-28 DIAGNOSIS — R1312 Dysphagia, oropharyngeal phase: Secondary | ICD-10-CM

## 2015-05-28 DIAGNOSIS — R1033 Periumbilical pain: Secondary | ICD-10-CM

## 2015-05-28 MED ORDER — CLONAZEPAM 2 MG PO TABS: 2.0000 mg | ORAL_TABLET | Freq: Three times a day (TID) | ORAL | Status: DC | PRN

## 2015-05-28 NOTE — Assessment & Plan Note (Signed)
Reviewed workup by Dr. Carles Collet. To date, no clear cause of dysphagia. Notes early satiety today. Will get CT abdomen for evaluation. Follow up with GI. Follow up here in 2 weeks.

## 2015-05-28 NOTE — Progress Notes (Signed)
Subjective:    Patient ID: Timothy Turner, male    DOB: Jan 14, 1941, 74 y.o.   MRN: [id number]  HPI  73YO male presents for follow up.  Recently seen by neurology for evaluation of dysphagia.  Continues to have dysphagia. Has to puree all food.  Abdominal pain - periumbilical over last few days. Described as soreness. Not taking anything for pain. Pain is constant and not changed by position, food intake. Abdomen feels "full." slight distension of abdomen. No NV. No diarrhea or constipation. No fever, chlls. No urinary symptoms.  Muscle pain - Would like to go back up on dosing of Clonazepam. Feels he had better control of muscle pain with 2mg  dosing. No sedation noted at this dose.     Wt Readings from Last 3 Encounters:  05/28/15 173 lb (78.472 kg)  05/01/15 175 lb (79.379 kg)  04/30/15 174 lb 4 oz (79.039 kg)   BP Readings from Last 3 Encounters:  05/28/15 131/68  05/01/15 126/80  04/30/15 125/73    Past Medical History  Diagnosis Date  . Heart murmur     History of  . Mitral valve disease   . Hypertension   . Hemorrhoids   . Psoriasis   . Thyroiditis   . Rheumatic heart valve incompetence   . Pain syndrome, chronic     Severe  . Myalgia     Multiple  . Gallstones   . Barrett esophagus   . Neuromuscular disorder ([place])   . Shortness of breath dyspnea   . Pneumonia     three times, last time >10 years ago  . Hypothyroidism   . GERD (gastroesophageal reflux disease)   . Depression    Family History  Problem Relation Age of Onset  . Heart failure Mother   . Heart failure Father   . Cancer Sister   . Aneurysm Sister     Brain   Past Surgical History  Procedure Laterality Date  . Back surgery    . Hemorroidectomy    . Upper gi endoscopy  03-06-2013    Dr Donnella Sham  . Colonoscopy  03-08-12    Dr Donnella Sham  . Cholecystectomy  08/03/13  . Esophagogastroduodenoscopy (egd) with propofol N/A 02/18/2015    Procedure: ESOPHAGOGASTRODUODENOSCOPY (EGD) WITH  PROPOFOL;  Surgeon: Lollie Sails, MD;  Location: [facility] ENDOSCOPY;  Service: Endoscopy;  Laterality: N/A;  . Cardiac catheterization N/A 02/22/2015    Procedure: Left Heart Cath and Coronary Angiography;  Surgeon: Minna Merritts, MD;  Location: [place] CV LAB;  Service: Cardiovascular;  Laterality: N/A;  . Esophageal manometry N/A 02/27/2015    Procedure: ESOPHAGEAL MANOMETRY (EM);  Surgeon: Lollie Sails, MD;  Location: [facility] ENDOSCOPY;  Service: Endoscopy;  Laterality: N/A;   Social History   Social History  . Marital Status: Married    Spouse Name: N/A  . Number of Children: 2  . Years of Education: N/A   Occupational History  . 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 Smoking status: Former Smoker -- 1.00 packs/day for 30 years    Quit date: 06/22/2004  . Smokeless tobacco: Never Used  . Alcohol Use: No  . Drug Use: No  . Sexual Activity: Not Asked   Other Topics Concern  . None   Social History Narrative   Drinks 4-5 cups of coffee a day and 3-4 cans of soda a day.    Review of  Systems  Constitutional: Negative for fever, chills, activity change, appetite change, fatigue and unexpected weight change.  HENT: Positive for trouble swallowing and voice change.   Eyes: Negative for visual disturbance.  Respiratory: Positive for shortness of breath. Negative for cough and wheezing.   Cardiovascular: Negative for chest pain, palpitations and leg swelling.  Gastrointestinal: Positive for abdominal pain. Negative for nausea, vomiting, diarrhea, constipation, abdominal distention and rectal pain.  Genitourinary: Negative for dysuria, urgency and difficulty urinating.  Musculoskeletal: Positive for myalgias. Negative for arthralgias and gait problem.  Skin: Negative for color change and rash.  Neurological: Positive for weakness.  Hematological: Negative for adenopathy.  Psychiatric/Behavioral: Negative for sleep  disturbance and dysphoric mood. The patient is not nervous/anxious.        Objective:    BP 131/68 mmHg  Pulse 61  Temp(Src) 97.9 F (36.6 C) (Oral)  Ht 6' (1.829 m)  Wt 173 lb (78.472 kg)  BMI 23.46 kg/m2  SpO2 96% Physical Exam  Constitutional: He is oriented to person, place, and time. He appears well-developed and well-nourished. No distress.  HENT:  Head: Normocephalic and atraumatic.  Right Ear: External ear normal.  Left Ear: External ear normal.  Nose: Nose normal.  Mouth/Throat: Oropharynx is clear and moist. No oropharyngeal exudate.  Eyes: Conjunctivae and EOM are normal. Pupils are equal, round, and reactive to light. Right eye exhibits no discharge. Left eye exhibits no discharge. No scleral icterus.  Neck: Normal range of motion. Neck supple. No tracheal deviation present. No thyromegaly present.  Cardiovascular: Normal rate, regular rhythm and normal heart sounds.  Exam reveals no gallop and no friction rub.   No murmur heard. Pulmonary/Chest: Effort normal and breath sounds normal. No respiratory distress. He has no wheezes. He has no rales. He exhibits no tenderness.  Abdominal: Soft. Bowel sounds are normal. He exhibits no distension and no mass. There is no hepatosplenomegaly. There is tenderness in the periumbilical area. There is no rebound and no guarding.  Musculoskeletal: Normal range of motion. He exhibits no edema.  Lymphadenopathy:    He has no cervical adenopathy.  Neurological: He is alert and oriented to person, place, and time. No cranial nerve deficit. Coordination normal.  Skin: Skin is warm and dry. No rash noted. He is not diaphoretic. No erythema. No pallor.  Psychiatric: He has a normal mood and affect. His behavior is normal. Judgment and thought content normal.          Assessment & Plan:   Problem List Items Addressed This Visit      Unprioritized   Dysphagia, oropharyngeal phase    Reviewed workup by Dr. Carles Collet. To date, no clear  cause of dysphagia. Notes early satiety today. Will get CT abdomen for evaluation. Follow up with GI. Follow up here in 2 weeks.      Muscle pain    Chronic. Extensive workup unrevealing. Started initially on Clonazepam to help with muscle spasm. Will increase dose back to 2mg  tid. He understands the risks of this medication.      Relevant Medications   clonazePAM (KLONOPIN) 2 MG tablet   Periumbilical abdominal pain - Primary    Periumbilical abdominal pain several days. Exam remarkable for tenderness in this area and mild tenderness in epigastric area. ROS remarkable for early satiety. Will get CT abdomen for further evaluation. Note recent dysphagia and extensive workup which has been unrevealing.      Relevant Orders   CT Abdomen Pelvis W Contrast  Return in about 2 weeks (around 06/11/2015) for Recheck.

## 2015-05-28 NOTE — Progress Notes (Signed)
Pre visit review using our clinic review tool, if applicable. No additional management support is needed unless otherwise documented below in the visit note. 

## 2015-05-28 NOTE — Assessment & Plan Note (Signed)
Chronic. Extensive workup unrevealing. Started initially on Clonazepam to help with muscle spasm. Will increase dose back to 2mg  tid. He understands the risks of this medication.

## 2015-05-28 NOTE — Patient Instructions (Signed)
Increase Clonazepam to 2mg  three times daily.  We will schedule a CT abdomen to evaluate abdominal pain.  Follow up 2 weeks.

## 2015-05-28 NOTE — Assessment & Plan Note (Signed)
Periumbilical abdominal pain several days. Exam remarkable for tenderness in this area and mild tenderness in epigastric area. ROS remarkable for early satiety. Will get CT abdomen for further evaluation. Note recent dysphagia and extensive workup which has been unrevealing.

## 2015-06-03 ENCOUNTER — Ambulatory Visit
Admission: RE | Admit: 2015-06-03 | Discharge: 2015-06-03 | Disposition: A | Discharge status: Home / Self Care | Payer: Medicare Other | Source: Ambulatory Visit | Attending: Internal Medicine | Admitting: Internal Medicine

## 2015-06-03 DIAGNOSIS — I700 Atherosclerosis of aorta: Secondary | ICD-10-CM | POA: Diagnosis not present

## 2015-06-03 DIAGNOSIS — R1033 Periumbilical pain: Secondary | ICD-10-CM | POA: Insufficient documentation

## 2015-06-03 DIAGNOSIS — Z9049 Acquired absence of other specified parts of digestive tract: Secondary | ICD-10-CM | POA: Insufficient documentation

## 2015-06-03 MED ORDER — IOHEXOL 300 MG/ML  SOLN
100.0000 mL | Freq: Once | INTRAMUSCULAR | Status: AC | PRN
  Administered 2015-06-03: 100 mL via INTRAVENOUS

## 2015-06-10 ENCOUNTER — Ambulatory Visit: Payer: Medicare Other | Admitting: Internal Medicine

## 2015-06-10 ENCOUNTER — Telehealth: Payer: Self-pay | Admitting: Neurology

## 2015-06-10 NOTE — Telephone Encounter (Signed)
Last test finally came back from [facility]) and it was negative.  I know that we have told patient before that we couldn't find neuro reason for his sx's but wanted him to know that this last test was negative

## 2015-06-10 NOTE — Telephone Encounter (Signed)
Patient made aware.

## 2015-06-12 ENCOUNTER — Ambulatory Visit: Payer: Medicare Other | Admitting: Internal Medicine

## 2015-06-21 ENCOUNTER — Encounter: Payer: Self-pay | Admitting: Internal Medicine

## 2015-06-21 ENCOUNTER — Ambulatory Visit (INDEPENDENT_AMBULATORY_CARE_PROVIDER_SITE_OTHER): Payer: Medicare Other | Admitting: Internal Medicine

## 2015-06-21 VITALS — BP 103/63 | HR 54 | Temp 97.4°F | Ht 72.0 in | Wt 175.1 lb

## 2015-06-21 DIAGNOSIS — R1312 Dysphagia, oropharyngeal phase: Secondary | ICD-10-CM

## 2015-06-21 DIAGNOSIS — R0600 Dyspnea, unspecified: Secondary | ICD-10-CM | POA: Diagnosis not present

## 2015-06-21 MED ORDER — ALBUTEROL SULFATE HFA 108 (90 BASE) MCG/ACT IN AERS
2.0000 | INHALATION_SPRAY | Freq: Four times a day (QID) | RESPIRATORY_TRACT | Status: DC | PRN
Start: 2015-06-21 — End: 2015-07-23

## 2015-06-21 NOTE — Progress Notes (Signed)
Subjective:    Patient ID: Timothy Turner, male    DOB: 01/06/1941, 74 y.o.   MRN: [id number]  HPI  [name] presents for follow up.  Seen on 12/6 for early satiety and peri-umbilical abdominal pain. CT scan showed no acute findings to explain symptoms.  Continues to have to puree all foods. Also thickening liquids, which helps with coughing after drinking. No NV. Occasional mild epigastric or mid abdominal pain.  Dyspnea - Unable to walk more than a few yards without dyspnea. Had PFTs which showed mild COPD. Smoked in distant past. No cough, chest pain.   Wt Readings from Last 3 Encounters:  06/21/15 175 lb 2 oz (79.436 kg)  05/28/15 173 lb (78.472 kg)  05/01/15 175 lb (79.379 kg)   BP Readings from Last 3 Encounters:  06/21/15 103/63  05/28/15 131/68  05/01/15 126/80    Past Medical History  Diagnosis Date  . Heart murmur     History of  . Mitral valve disease   . Hypertension   . Hemorrhoids   . Psoriasis   . Thyroiditis   . Rheumatic heart valve incompetence   . Pain syndrome, chronic     Severe  . Myalgia     Multiple  . Gallstones   . Barrett esophagus   . Neuromuscular disorder ([place])   . Shortness of breath dyspnea   . Pneumonia     three times, last time >10 years ago  . Hypothyroidism   . GERD (gastroesophageal reflux disease)   . Depression    Family History  Problem Relation Age of Onset  . Heart failure Mother   . Heart failure Father   . Cancer Sister   . Aneurysm Sister     Brain   Past Surgical History  Procedure Laterality Date  . Back surgery    . Hemorroidectomy    . Upper gi endoscopy  03-06-2013    Dr Donnella Sham  . Colonoscopy  03-08-12    Dr Donnella Sham  . Cholecystectomy  08/03/13  . Esophagogastroduodenoscopy (egd) with propofol N/A 02/18/2015    Procedure: ESOPHAGOGASTRODUODENOSCOPY (EGD) WITH PROPOFOL;  Surgeon: Lollie Sails, MD;  Location: [facility] ENDOSCOPY;  Service: Endoscopy;  Laterality: N/A;  . Cardiac  catheterization N/A 02/22/2015    Procedure: Left Heart Cath and Coronary Angiography;  Surgeon: Minna Merritts, MD;  Location: [place] CV LAB;  Service: Cardiovascular;  Laterality: N/A;  . Esophageal manometry N/A 02/27/2015    Procedure: ESOPHAGEAL MANOMETRY (EM);  Surgeon: Lollie Sails, MD;  [facility] ENDOSCOPY;  Service: Endoscopy;  Laterality: N/A;   Social History   Social History  . Marital Status: Married    Spouse Name: N/A  . Number of Children: 2  . Years of Education: N/A   Occupational History  . 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 Smoking status: Former Smoker -- 1.00 packs/day for 30 years    Quit date: 06/22/2004  . Smokeless tobacco: Never Used  . Alcohol Use: No  . Drug Use: No  . Sexual Activity: Not on file   Other Topics Concern  . Not on file   Social History Narrative   Drinks 4-5 cups of coffee a day and 3-4 cans of soda a day.    Review of Systems  Constitutional: Negative for fever, chills, activity change, appetite change, fatigue and unexpected weight change.  HENT: Positive for trouble swallowing.  Eyes: Negative for visual disturbance.  Respiratory: Positive for shortness of breath. Negative for cough.   Cardiovascular: Negative for chest pain, palpitations and leg swelling.  Gastrointestinal: Positive for abdominal pain. Negative for nausea, vomiting, diarrhea, constipation and abdominal distention.  Genitourinary: Negative for dysuria, urgency and difficulty urinating.  Musculoskeletal: Negative for arthralgias and gait problem.  Skin: Negative for color change and rash.  Hematological: Negative for adenopathy.  Psychiatric/Behavioral: Negative for sleep disturbance and dysphoric mood. The patient is not nervous/anxious.        Objective:    BP 103/63 mmHg  Pulse 54  Temp(Src) 97.4 F (36.3 C) (Oral)  Ht 6' (1.829 m)  Wt 175 lb 2 oz (79.436 kg)  BMI 23.75 kg/m2   SpO2 97% Physical Exam  Constitutional: He is oriented to person, place, and time. He appears well-developed and well-nourished. No distress.  HENT:  Head: Normocephalic and atraumatic.  Right Ear: External ear normal.  Left Ear: External ear normal.  Nose: Nose normal.  Mouth/Throat: Oropharynx is clear and moist. No oropharyngeal exudate.  Eyes: Conjunctivae and EOM are normal. Pupils are equal, round, and reactive to light. Right eye exhibits no discharge. Left eye exhibits no discharge. No scleral icterus.  Neck: Normal range of motion. Neck supple. No tracheal deviation present. No thyromegaly present.  Cardiovascular: Normal rate, regular rhythm and normal heart sounds.  Exam reveals no gallop and no friction rub.   No murmur heard. Pulmonary/Chest: Effort normal and breath sounds normal. No accessory muscle usage. No tachypnea. No respiratory distress. He has no decreased breath sounds. He has no wheezes. He has no rhonchi. He has no rales. He exhibits no tenderness.  Musculoskeletal: Normal range of motion. He exhibits no edema.  Lymphadenopathy:    He has no cervical adenopathy.  Neurological: He is alert and oriented to person, place, and time. No cranial nerve deficit. Coordination normal.  Skin: Skin is warm and dry. No rash noted. He is not diaphoretic. No erythema. No pallor.  Psychiatric: He has a normal mood and affect. His behavior is normal. Judgment and thought content normal.          Assessment & Plan:   Problem List Items Addressed This Visit      Unprioritized   Dysphagia, oropharyngeal phase    Chronic dysphagia. Extensive workup including GI evaluation, neurology evaluation show no clear cause. Reviewed previous EGD which showed sharp angle between esophagus and stomach. CT showed no acute process at this location. Question if Nissen would be helpful for him to reduce reflux and improve symptoms. Will set up second opinion with GI at [facility].      Relevant  Orders   Ambulatory referral to Gastroenterology   Dyspnea - Primary    Persistent dyspnea with exertion. PFTs suggested mild COPD. Will try adding Albuterol prn to see if any improvement.  Also question if hiatal hernia may be contributing. Will set up second opinion with GI at [facility].      Relevant Medications   albuterol (PROVENTIL HFA;VENTOLIN HFA) 108 (90 Base) MCG/ACT inhaler       Return in about 4 weeks (around 07/19/2015) for Recheck.

## 2015-06-21 NOTE — Progress Notes (Signed)
Pre visit review using our clinic review tool, if applicable. No additional management support is needed unless otherwise documented below in the visit note. 

## 2015-06-21 NOTE — Assessment & Plan Note (Signed)
Persistent dyspnea with exertion. PFTs suggested mild COPD. Will try adding Albuterol prn to see if any improvement.  Also question if hiatal hernia may be contributing. Will set up second opinion with GI at [facility].

## 2015-06-21 NOTE — Patient Instructions (Addendum)
Take 2 puffs of Albuterol inhaler prior to exercise.  Email with update. If this is helpful, we may try a long-acting inhaler.  We will set up evaluation with GI at [facility].

## 2015-06-21 NOTE — Assessment & Plan Note (Signed)
Chronic dysphagia. Extensive workup including GI evaluation, neurology evaluation show no clear cause. Reviewed previous EGD which showed sharp angle between esophagus and stomach. CT showed no acute process at this location. Question if Nissen would be helpful for him to reduce reflux and improve symptoms. Will set up second opinion with GI at [facility].

## 2015-06-22 ENCOUNTER — Encounter: Payer: Self-pay | Admitting: Internal Medicine

## 2015-06-28 ENCOUNTER — Encounter: Payer: Self-pay | Admitting: Internal Medicine

## 2015-07-23 ENCOUNTER — Encounter: Payer: Self-pay | Admitting: Internal Medicine

## 2015-07-23 ENCOUNTER — Ambulatory Visit (INDEPENDENT_AMBULATORY_CARE_PROVIDER_SITE_OTHER): Payer: Medicare Other | Admitting: Internal Medicine

## 2015-07-23 VITALS — BP 124/72 | HR 52 | Temp 97.7°F | Ht 72.0 in | Wt 172.4 lb

## 2015-07-23 DIAGNOSIS — R1312 Dysphagia, oropharyngeal phase: Secondary | ICD-10-CM | POA: Diagnosis not present

## 2015-07-23 DIAGNOSIS — R0600 Dyspnea, unspecified: Secondary | ICD-10-CM | POA: Diagnosis not present

## 2015-07-23 MED ORDER — ALBUTEROL SULFATE HFA 108 (90 BASE) MCG/ACT IN AERS: 2.0000 | INHALATION_SPRAY | Freq: Four times a day (QID) | RESPIRATORY_TRACT | Status: DC | PRN

## 2015-07-23 NOTE — Assessment & Plan Note (Signed)
Progressive dysphagia starting late last summer. Reviewed barium swallow with pt again today which showed aspiration of liquids. S/p ENT evaluation, with recommendation of GI follow up. Local GI has referred to [facility]. Question if he might be a candidate for Nissen to help with reflux symptoms. Will follow up after [facility] evaluation.

## 2015-07-23 NOTE — Patient Instructions (Addendum)
Follow up after visit at [facility]  Continue Albuterol as needed prior to walking.

## 2015-07-23 NOTE — Progress Notes (Addendum)
Subjective:    Patient ID: Timothy Turner, male    DOB: January 14, 1941, 75 y.o.   MRN: [id number]  HPI  75YO male presents for follow up.  Having dysphagia and dyspnea. Second opinion referral placed to [facility], however denied by [employer], unless local GI provider would make referral. Local GI provider referred to [facility]. Pt now on wait list. Also started on Albuterol last visit.  No change to dysphagia. Has to puree foods. Using thickening powder by [employer] to thicken all liquids with some improvement in reflux/coughing after drinking.  Using Albuterol prior to walks. Up to five laps of hills around property, about 6 miles total. Taking inhaler about 62min prior to walk. No dyspnea during walk.   Wt Readings from Last 3 Encounters:  07/23/15 172 lb 6 oz (78.189 kg)  06/21/15 175 lb 2 oz (79.436 kg)  05/28/15 173 lb (78.472 kg)   BP Readings from Last 3 Encounters:  07/23/15 124/72  06/21/15 103/63  05/28/15 131/68    Past Medical History  Diagnosis Date  . Heart murmur     History of  . Mitral valve disease   . Hypertension   . Hemorrhoids   . Psoriasis   . Thyroiditis   . Rheumatic heart valve incompetence   . Pain syndrome, chronic     Severe  . Myalgia     Multiple  . Gallstones   . Barrett esophagus   . Neuromuscular disorder ([place])   . Shortness of breath dyspnea   . Pneumonia     three times, last time >10 years ago  . Hypothyroidism   . GERD (gastroesophageal reflux disease)   . Depression    Family History  Problem Relation Age of Onset  . Heart failure Mother   . Heart failure Father   . Cancer Sister   . Aneurysm Sister     Brain   Past Surgical History  Procedure Laterality Date  . Back surgery    . Hemorroidectomy    . Upper gi endoscopy  03-06-2013    Dr Donnella Sham  . Colonoscopy  03-08-12    Dr Donnella Sham  . Cholecystectomy  08/03/13  . Esophagogastroduodenoscopy (egd) with propofol N/A 02/18/2015    Procedure: ESOPHAGOGASTRODUODENOSCOPY (EGD) WITH  PROPOFOL;  Surgeon: Lollie Sails, MD;  Location: [facility] ENDOSCOPY;  Service: Endoscopy;  Laterality: N/A;  . Cardiac catheterization N/A 02/22/2015    Procedure: Left Heart Cath and Coronary Angiography;  Surgeon: Minna Merritts, MD;  Location: [place] CV LAB;  Service: Cardiovascular;  Laterality: N/A;  . Esophageal manometry N/A 02/27/2015    Procedure: ESOPHAGEAL MANOMETRY (EM);  Surgeon: Lollie Sails, MD;  Location: [facility] ENDOSCOPY;  Service: Endoscopy;  Laterality: N/A;   Social History   Social History  . Marital Status: Married    Spouse Name: N/A  . Number of Children: 2  . Years of Education: N/A   Occupational History  . 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 Smoking status: Former Smoker -- 1.00 packs/day for 30 years    Quit date: 06/22/2004  . Smokeless tobacco: Never Used  . Alcohol Use: No  . Drug Use: No  . Sexual Activity: Not Asked   Other Topics Concern  . None   Social History Narrative   Drinks 4-5 cups of coffee a day and 3-4 cans of soda a day.    Review of Systems  Constitutional:  Negative for fever, chills, activity change, appetite change, fatigue and unexpected weight change.  HENT: Positive for trouble swallowing and voice change.   Eyes: Negative for visual disturbance.  Respiratory: Positive for shortness of breath. Negative for cough and chest tightness.   Cardiovascular: Negative for chest pain, palpitations and leg swelling.  Gastrointestinal: Negative for nausea, vomiting, abdominal pain, diarrhea, constipation and abdominal distention.  Genitourinary: Negative for dysuria, urgency and difficulty urinating.  Musculoskeletal: Negative for arthralgias and gait problem.  Skin: Negative for color change and rash.  Hematological: Negative for adenopathy.  Psychiatric/Behavioral: Negative for sleep disturbance and dysphoric mood. The patient is not nervous/anxious.          Objective:    BP 124/72 mmHg  Pulse 52  Temp(Src) 97.7 F (36.5 C) (Oral)  Ht 6' (1.829 m)  Wt 172 lb 6 oz (78.189 kg)  BMI 23.37 kg/m2  SpO2 97% Physical Exam  Constitutional: He is oriented to person, place, and time. He appears well-developed and well-nourished. No distress.  HENT:  Head: Normocephalic and atraumatic.  Right Ear: External ear normal.  Left Ear: External ear normal.  Nose: Nose normal.  Mouth/Throat: Oropharynx is clear and moist. No oropharyngeal exudate.  Eyes: Conjunctivae and EOM are normal. Pupils are equal, round, and reactive to light. Right eye exhibits no discharge. Left eye exhibits no discharge. No scleral icterus.  Neck: Normal range of motion. Neck supple. No tracheal deviation present. No thyromegaly present.  Cardiovascular: Normal rate, regular rhythm and normal heart sounds.  Exam reveals no gallop and no friction rub.   No murmur heard. Pulmonary/Chest: Effort normal and breath sounds normal. No accessory muscle usage. No tachypnea. No respiratory distress. He has no decreased breath sounds. He has no wheezes. He has no rhonchi. He has no rales. He exhibits no tenderness.  Musculoskeletal: Normal range of motion. He exhibits no edema.  Lymphadenopathy:    He has no cervical adenopathy.  Neurological: He is alert and oriented to person, place, and time. No cranial nerve deficit. Coordination normal.  Skin: Skin is warm and dry. No rash noted. He is not diaphoretic. No erythema. No pallor.  Psychiatric: He has a normal mood and affect. His behavior is normal. Judgment and thought content normal.          Assessment & Plan:  Over 13min of which >50% spent in face-to-face contact with patient discussing plan of care  Problem List Items Addressed This Visit      Unprioritized   Dysphagia, oropharyngeal phase    Progressive dysphagia starting late last summer. Reviewed barium swallow with pt again today which showed aspiration of liquids. S/p  ENT evaluation, with recommendation of GI follow up. Local GI has referred to [facility]. Question if he might be a candidate for Nissen to help with reflux symptoms. Will follow up after [facility] evaluation.      Dyspnea - Primary    Symptoms improved with addition of Albuterol prior to exercise and with thickening liquids. Will follow      Relevant Medications   albuterol (PROVENTIL HFA;VENTOLIN HFA) 108 (90 Base) MCG/ACT inhaler       Return in about 2 months (around 09/20/2015) for Recheck.

## 2015-07-23 NOTE — Assessment & Plan Note (Signed)
Symptoms improved with addition of Albuterol prior to exercise and with thickening liquids. Will follow

## 2015-07-23 NOTE — Progress Notes (Signed)
Pre visit review using our clinic review tool, if applicable. No additional management support is needed unless otherwise documented below in the visit note. 

## 2015-07-31 DIAGNOSIS — R339 Retention of urine, unspecified: Secondary | ICD-10-CM | POA: Insufficient documentation

## 2015-09-16 ENCOUNTER — Ambulatory Visit: Payer: Medicare Other | Admitting: Internal Medicine

## 2015-10-07 DIAGNOSIS — R1319 Other dysphagia: Secondary | ICD-10-CM | POA: Insufficient documentation

## 2015-10-17 ENCOUNTER — Telehealth: Payer: Self-pay | Admitting: Gastroenterology

## 2015-10-17 ENCOUNTER — Ambulatory Visit (INDEPENDENT_AMBULATORY_CARE_PROVIDER_SITE_OTHER): Payer: Medicare Other | Admitting: Internal Medicine

## 2015-10-17 ENCOUNTER — Encounter: Payer: Self-pay | Admitting: Internal Medicine

## 2015-10-17 VITALS — BP 112/70 | HR 60 | Temp 97.7°F | Ht 72.0 in | Wt 180.0 lb

## 2015-10-17 DIAGNOSIS — F4321 Adjustment disorder with depressed mood: Secondary | ICD-10-CM | POA: Diagnosis not present

## 2015-10-17 DIAGNOSIS — R1312 Dysphagia, oropharyngeal phase: Secondary | ICD-10-CM

## 2015-10-17 DIAGNOSIS — M791 Myalgia, unspecified site: Secondary | ICD-10-CM

## 2015-10-17 DIAGNOSIS — R5382 Chronic fatigue, unspecified: Secondary | ICD-10-CM | POA: Diagnosis not present

## 2015-10-17 DIAGNOSIS — K2270 Barrett's esophagus without dysplasia: Secondary | ICD-10-CM | POA: Diagnosis not present

## 2015-10-17 DIAGNOSIS — Z1159 Encounter for screening for other viral diseases: Secondary | ICD-10-CM

## 2015-10-17 LAB — COMPREHENSIVE METABOLIC PANEL
ALK PHOS: 63 U/L (ref 39–117)
ALT: 29 U/L (ref 0–53)
AST: 22 U/L (ref 0–37)
Albumin: 4.3 g/dL (ref 3.5–5.2)
BUN: 20 mg/dL (ref 6–23)
CO2: 32 mEq/L (ref 19–32)
CREATININE: 1.2 mg/dL (ref 0.40–1.50)
Calcium: 9.7 mg/dL (ref 8.4–10.5)
Chloride: 105 mEq/L (ref 96–112)
GFR: 63.17 mL/min (ref >60.00)
GLUCOSE: 96 mg/dL (ref 70–99)
POTASSIUM: 4.5 meq/L (ref 3.5–5.1)
Sodium: 142 mEq/L (ref 135–145)
Total Bilirubin: 0.8 mg/dL (ref 0.2–1.2)
Total Protein: 6.7 g/dL (ref 6.0–8.3)

## 2015-10-17 LAB — CBC WITH DIFFERENTIAL/PLATELET
BASOS PCT: 0.6 % (ref 0.0–3.0)
Basophils Absolute: 0 10*3/uL (ref 0.0–0.1)
EOS ABS: 0.2 10*3/uL (ref 0.0–0.7)
EOS PCT: 2.7 % (ref 0.0–5.0)
HCT: 45.4 % (ref 39.0–52.0)
Hemoglobin: 14.8 g/dL (ref 13.0–17.0)
LYMPHS ABS: 3 10*3/uL (ref 0.7–4.0)
Lymphocytes Relative: 33.4 % (ref 12.0–46.0)
MCHC: 32.6 g/dL (ref 30.0–36.0)
MCV: 88.6 fl (ref 78.0–100.0)
MONO ABS: 1 10*3/uL (ref 0.1–1.0)
Monocytes Relative: 11.1 % (ref 3.0–12.0)
NEUTROS PCT: 52.2 % (ref 43.0–77.0)
Neutro Abs: 4.7 10*3/uL (ref 1.4–7.7)
Platelets: 194 10*3/uL (ref 150.0–400.0)
RBC: 5.13 Mil/uL (ref 4.22–5.81)
RDW: 14 % (ref 11.5–15.5)
WBC: 9 10*3/uL (ref 4.0–10.5)

## 2015-10-17 LAB — TSH: TSH: 2.59 u[IU]/mL (ref 0.35–4.50)

## 2015-10-17 LAB — VITAMIN B12: VITAMIN B 12: 706 pg/mL (ref 211–911)

## 2015-10-17 MED ORDER — CLONAZEPAM 2 MG PO TABS: 2.0000 mg | ORAL_TABLET | Freq: Three times a day (TID) | ORAL | Status: DC | PRN

## 2015-10-17 MED ORDER — ESCITALOPRAM OXALATE 10 MG PO TABS: 10.0000 mg | ORAL_TABLET | Freq: Every day | ORAL | Status: DC

## 2015-10-17 MED ORDER — CELECOXIB 200 MG PO CAPS: 200.0000 mg | ORAL_CAPSULE | Freq: Two times a day (BID) | ORAL | Status: DC

## 2015-10-17 MED ORDER — EZETIMIBE 10 MG PO TABS: 10.0000 mg | ORAL_TABLET | Freq: Every day | ORAL | Status: DC

## 2015-10-17 MED ORDER — ESOMEPRAZOLE MAGNESIUM 40 MG PO PACK: 40.0000 mg | PACK | Freq: Every day | ORAL | Status: DC

## 2015-10-17 NOTE — Progress Notes (Signed)
Subjective:    Patient ID: Timothy Turner, male    DOB: [birth year], 75 y.o.   MRN: [id number]  HPI  75YO male presents for follow up.  Dysphagia - Seen at [facility]. Rec Speech Therapy. Continues to have trouble swallowing and reflux. Has to puree all foods. No abdominal pain.  Continues to feel short of breath at times. Particularly when bending forward.  Wt Readings from Last 3 Encounters:  10/17/15 180 lb (81.647 kg)  07/23/15 172 lb 6 oz (78.189 kg)  06/21/15 175 lb 2 oz (79.436 kg)   BP Readings from Last 3 Encounters:  10/17/15 112/70  07/23/15 124/72  06/21/15 103/63    Past Medical History  Diagnosis Date  . Heart murmur     History of  . Mitral valve disease   . Hypertension   . Hemorrhoids   . Psoriasis   . Thyroiditis   . Rheumatic heart valve incompetence   . Pain syndrome, chronic     Severe  . Myalgia     Multiple  . Gallstones   . Barrett esophagus   . Neuromuscular disorder ([place])   . Shortness of breath dyspnea   . Pneumonia     three times, last time >10 years ago  . Hypothyroidism   . GERD (gastroesophageal reflux disease)   . Depression    Family History  Problem Relation Age of Onset  . Heart failure Mother   . Heart failure Father   . Cancer Sister   . Aneurysm Sister     Brain   Past Surgical History  Procedure Laterality Date  . Back surgery    . Hemorroidectomy    . Upper gi endoscopy  03-06-2013    Dr Donnella Sham  . Colonoscopy  03-08-12    Dr Donnella Sham  . Cholecystectomy  08/03/13  . Esophagogastroduodenoscopy (egd) with propofol N/A 02/18/2015    Procedure: ESOPHAGOGASTRODUODENOSCOPY (EGD) WITH PROPOFOL;  Surgeon: Lollie Sails, MD;  Location: [facility] ENDOSCOPY;  Service: Endoscopy;  Laterality: N/A;  . Cardiac catheterization N/A 02/22/2015    Procedure: Left Heart Cath and Coronary Angiography;  Surgeon: Minna Merritts, MD;  Location: [place] CV LAB;  Service: Cardiovascular;  Laterality: N/A;  . Esophageal manometry N/A  02/27/2015    Procedure: ESOPHAGEAL MANOMETRY (EM);  Surgeon: Lollie Sails, MD;  Location: [facility] ENDOSCOPY;  Service: Endoscopy;  Laterality: N/A;   Social History   Social History  . Marital Status: Married    Spouse Name: N/A  . Number of Children: 2  . Years of Education: N/A   Occupational History  . 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 Smoking status: Former Smoker -- 1.00 packs/day for 30 years    Quit date: 06/22/2004  . Smokeless tobacco: Never Used  . Alcohol Use: No  . Drug Use: No  . Sexual Activity: Not Asked   Other Topics Concern  . None   Social History Narrative   Drinks 4-5 cups of coffee a day and 3-4 cans of soda a day.    Review of Systems  Constitutional: Positive for fatigue. Negative for fever, chills, activity change, appetite change and unexpected weight change.  HENT: Positive for trouble swallowing.   Eyes: Negative for visual disturbance.  Respiratory: Positive for shortness of breath. Negative for cough.   Cardiovascular: Negative for chest pain, palpitations and leg swelling.  Gastrointestinal: Negative for vomiting, abdominal pain, diarrhea,  constipation, abdominal distention and rectal pain.  Genitourinary: Negative for dysuria, urgency and difficulty urinating.  Musculoskeletal: Negative for arthralgias and gait problem.  Skin: Negative for color change and rash.  Hematological: Negative for adenopathy.  Psychiatric/Behavioral: Negative for sleep disturbance and dysphoric mood. The patient is not nervous/anxious.        Objective:    BP 112/70 mmHg  Pulse 60  Temp(Src) 97.7 F (36.5 C) (Oral)  Ht 6' (1.829 m)  Wt 180 lb (81.647 kg)  BMI 24.41 kg/m2  SpO2 95% Physical Exam  Constitutional: He is oriented to person, place, and time. He appears well-developed and well-nourished. No distress.  HENT:  Head: Normocephalic and atraumatic.  Right Ear: External ear normal.  Left  Ear: External ear normal.  Nose: Nose normal.  Mouth/Throat: Oropharynx is clear and moist. No oropharyngeal exudate.  Eyes: Conjunctivae and EOM are normal. Pupils are equal, round, and reactive to light. Right eye exhibits no discharge. Left eye exhibits no discharge. No scleral icterus.  Neck: Normal range of motion. Neck supple. No tracheal deviation present. No thyromegaly present.  Cardiovascular: Normal rate, regular rhythm and normal heart sounds.  Exam reveals no gallop and no friction rub.   No murmur heard. Pulmonary/Chest: Effort normal and breath sounds normal. No accessory muscle usage. No tachypnea. No respiratory distress. He has no decreased breath sounds. He has no wheezes. He has no rhonchi. He has no rales. He exhibits no tenderness.  Musculoskeletal: Normal range of motion. He exhibits no edema.  Lymphadenopathy:    He has no cervical adenopathy.  Neurological: He is alert and oriented to person, place, and time. No cranial nerve deficit. Coordination normal.  Skin: Skin is warm and dry. No rash noted. He is not diaphoretic. No erythema. No pallor.  Psychiatric: He has a normal mood and affect. His behavior is normal. Judgment and thought content normal.          Assessment & Plan:  Over 34min of which >50% spent in face-to-face contact with patient discussing plan of care  Problem List Items Addressed This Visit      Unprioritized   Adjustment disorder with depressed mood   Relevant Medications   escitalopram (LEXAPRO) 10 MG tablet   Barrett esophagus   Dysphagia, oropharyngeal phase - Primary    Persistent dysphagia and inability to swallow solid foods. Also has significant GERD. Reviewed work up from both [employer]. if he might benefit from Nissen to help with GERD. Also question if any surgical intervention might help with swallowing. This has had a significant negative impact on his quality of life. Will set up second opinion.      Relevant Orders    Ambulatory referral to Gastroenterology   Muscle pain   Relevant Medications   clonazePAM (KLONOPIN) 2 MG tablet    Other Visit Diagnoses    Chronic fatigue        Relevant Orders    TSH    Comprehensive metabolic panel    CBC with Differential/Platelet    B12    Need for hepatitis C screening test        Relevant Orders    Hepatitis C antibody        Return in about 4 weeks (around 11/14/2015) for Recheck.  Ronette Deter, MD Internal Medicine [facility]

## 2015-10-17 NOTE — Telephone Encounter (Signed)
He can see me for second opinion.  However, I will need all office notes, procedure reports and esophageal motility (if any) and baruim swallow XRay reports to be received by the time of his office visit.

## 2015-10-17 NOTE — Assessment & Plan Note (Addendum)
Persistent dysphagia and inability to swallow solid foods. Also has significant GERD. Reviewed work up from both [employer]. if he might benefit from Nissen to help with GERD. Also question if any surgical intervention might help with swallowing. This has had a significant negative impact on his quality of life. Will set up second opinion.

## 2015-10-17 NOTE — Progress Notes (Signed)
Pre visit review using our clinic review tool, if applicable. No additional management support is needed unless otherwise documented below in the visit note. 

## 2015-10-17 NOTE — Patient Instructions (Addendum)
We will set up evaluation with [facility].  Follow up in 4 weeks.

## 2015-10-18 LAB — HEPATITIS C ANTIBODY: HCV AB: NEGATIVE

## 2015-10-23 ENCOUNTER — Encounter: Payer: Self-pay | Admitting: Internal Medicine

## 2015-10-23 ENCOUNTER — Telehealth: Payer: Self-pay | Admitting: Internal Medicine

## 2015-10-24 ENCOUNTER — Telehealth: Payer: Self-pay

## 2015-10-24 NOTE — Telephone Encounter (Signed)
Received a fax today that Celecoxib is not covered with is insurance.  Covered alternatives are Etodolac ER tabs, Ibuprofen tabs, meloxicam tabs, or naproxen tabs.  Please advise?

## 2015-10-24 NOTE — Telephone Encounter (Signed)
We will need to do PA for Celebrex. He cannot take other NSAIDS

## 2015-10-28 NOTE — Telephone Encounter (Signed)
PA submitted and awaiting results.

## 2015-10-29 NOTE — Telephone Encounter (Signed)
I will submit an appeal for now and see if I can get it approved. thanks

## 2015-10-29 NOTE — Telephone Encounter (Signed)
PA denied, please advise.    Alternatives are Etodolac, Ibuprofen, meloxicam, or naproxem.  Thanks

## 2015-10-29 NOTE — Telephone Encounter (Signed)
He cannot take any of those. He has Barrett's esophagus. We should try to fight this. Perhaps his GI doctor would be willing to write letter in support

## 2015-11-04 NOTE — Telephone Encounter (Signed)
5/15 records received from PCP - routed to Dr. Loletha Carrow .

## 2015-11-05 ENCOUNTER — Telehealth: Payer: Self-pay | Admitting: Internal Medicine

## 2015-11-05 NOTE — Telephone Encounter (Signed)
Physician requested celebtrex 200 mg for the patient on 5.8.17. [employer] is requiring that the patient try these covered medications before dispensing. Naproxen ,Etodolac or Meloxicam.   [employer] can be reached at [phone]   [id number]

## 2015-11-07 ENCOUNTER — Telehealth: Payer: Self-pay | Admitting: Internal Medicine

## 2015-11-07 ENCOUNTER — Encounter (HOSPITAL_COMMUNITY): Payer: Self-pay | Admitting: Emergency Medicine

## 2015-11-07 ENCOUNTER — Emergency Department (HOSPITAL_COMMUNITY)
Admission: EM | Admit: 2015-11-07 | Discharge: 2015-11-07 | Disposition: A | Discharge status: Home / Self Care | Payer: Medicare Other | Attending: Emergency Medicine | Admitting: Emergency Medicine

## 2015-11-07 ENCOUNTER — Emergency Department (HOSPITAL_COMMUNITY): Payer: Medicare Other

## 2015-11-07 DIAGNOSIS — F329 Major depressive disorder, single episode, unspecified: Secondary | ICD-10-CM | POA: Insufficient documentation

## 2015-11-07 DIAGNOSIS — R011 Cardiac murmur, unspecified: Secondary | ICD-10-CM | POA: Insufficient documentation

## 2015-11-07 DIAGNOSIS — E069 Thyroiditis, unspecified: Secondary | ICD-10-CM | POA: Diagnosis not present

## 2015-11-07 DIAGNOSIS — E039 Hypothyroidism, unspecified: Secondary | ICD-10-CM | POA: Diagnosis not present

## 2015-11-07 DIAGNOSIS — K921 Melena: Secondary | ICD-10-CM | POA: Diagnosis present

## 2015-11-07 DIAGNOSIS — G894 Chronic pain syndrome: Secondary | ICD-10-CM | POA: Diagnosis not present

## 2015-11-07 DIAGNOSIS — Z872 Personal history of diseases of the skin and subcutaneous tissue: Secondary | ICD-10-CM | POA: Insufficient documentation

## 2015-11-07 DIAGNOSIS — Z87891 Personal history of nicotine dependence: Secondary | ICD-10-CM | POA: Diagnosis not present

## 2015-11-07 DIAGNOSIS — Z8701 Personal history of pneumonia (recurrent): Secondary | ICD-10-CM | POA: Insufficient documentation

## 2015-11-07 DIAGNOSIS — I10 Essential (primary) hypertension: Secondary | ICD-10-CM | POA: Diagnosis not present

## 2015-11-07 DIAGNOSIS — R1310 Dysphagia, unspecified: Secondary | ICD-10-CM | POA: Insufficient documentation

## 2015-11-07 DIAGNOSIS — K219 Gastro-esophageal reflux disease without esophagitis: Secondary | ICD-10-CM | POA: Diagnosis not present

## 2015-11-07 DIAGNOSIS — Z8669 Personal history of other diseases of the nervous system and sense organs: Secondary | ICD-10-CM | POA: Diagnosis not present

## 2015-11-07 DIAGNOSIS — Z79899 Other long term (current) drug therapy: Secondary | ICD-10-CM | POA: Diagnosis not present

## 2015-11-07 LAB — CBC
HCT: 47.4 % (ref 39.0–52.0)
Hemoglobin: 15.4 g/dL (ref 13.0–17.0)
MCH: 28.9 pg (ref 26.0–34.0)
MCHC: 32.5 g/dL (ref 30.0–36.0)
MCV: 88.9 fL (ref 78.0–100.0)
PLATELETS: 208 10*3/uL (ref 150–400)
RBC: 5.33 MIL/uL (ref 4.22–5.81)
RDW: 13.5 % (ref 11.5–15.5)
WBC: 8.9 10*3/uL (ref 4.0–10.5)

## 2015-11-07 LAB — COMPREHENSIVE METABOLIC PANEL
ALK PHOS: 78 U/L (ref 38–126)
ALT: 35 U/L (ref 17–63)
AST: 28 U/L (ref 15–41)
Albumin: 4.2 g/dL (ref 3.5–5.0)
Anion gap: 10 (ref 5–15)
BUN: 19 mg/dL (ref 6–20)
CHLORIDE: 108 mmol/L (ref 101–111)
CO2: 25 mmol/L (ref 22–32)
CREATININE: 1.33 mg/dL — AB (ref 0.61–1.24)
Calcium: 9.5 mg/dL (ref 8.9–10.3)
GFR calc Af Amer: >60 mL/min (ref >60)
GFR, EST NON AFRICAN AMERICAN: 52 mL/min — AB (ref >60)
Glucose, Bld: 123 mg/dL — ABNORMAL HIGH (ref 65–99)
Potassium: 4.1 mmol/L (ref 3.5–5.1)
SODIUM: 143 mmol/L (ref 135–145)
Total Bilirubin: 0.4 mg/dL (ref 0.3–1.2)
Total Protein: 7 g/dL (ref 6.5–8.1)

## 2015-11-07 LAB — POC OCCULT BLOOD, ED: Fecal Occult Bld: NEGATIVE

## 2015-11-07 NOTE — ED Notes (Signed)
Pt reports questionable blood in stools for 8 weeks, pt also reports that while walking today he coughed up a mild amount of blood and what he believes to be part of his throat muscle. Pt alert x4. NAD at this time.

## 2015-11-07 NOTE — Discharge Instructions (Signed)
You should be getting a call from [facility] gastroenterology. If you do not get a call from them in the next couple days, their phone numbers and her paperwork.  Go to primary care doctor to have a repeat hemoglobin in 2-3 days.  Return to the emergency department should you have worsening trouble swallowing, trouble breathing, onset of pain, worsening black spots in your stool, or any other concerning symptoms.

## 2015-11-07 NOTE — Telephone Encounter (Signed)
No. He needs to be seen immediately in the ER if "hacking up blood" and solid materials. This area could started to bleed repeated and this could be life threatening.

## 2015-11-07 NOTE — Telephone Encounter (Signed)
I spoke with pt today to remind him of his appt and he stated that this morning he started hacking for about 10-15 min and he hacked up some meat substance about half by one half covered in blood and while he was hacking up he was hacking up blood. Pt thinks it is a part of his tonsils because he was on liquids since last June and July. Pt said he will bring it in with in. Pt also mentioned blood in stool for about 6-8 weeks he said it looks like little round balls size of a pea. I transferred pt nurse line. Call pt @ [phone]. Thank you!

## 2015-11-07 NOTE — Telephone Encounter (Signed)
Please advise, thanks.

## 2015-11-07 NOTE — Telephone Encounter (Signed)
Spoke with the patient and his wife, expressed concern that he has coughed up a object that looked like raw meat.  Explained that he needs to be evaluated today at a er for this, he doesn't want to go to [facility], I explained he could go to [facility] ER, and he and the wife agreed. They will follow up with [place].

## 2015-11-07 NOTE — ED Notes (Signed)
MD at bedside. 

## 2015-11-07 NOTE — Telephone Encounter (Signed)
[facility] Patient Name: Timothy Turner DOB: 09/12/1940 Initial Comment Caller states, this morning while walking, he felt like something was stuck in his esophagus. He started coughing up a little blood, then he finally coughed up a piece of meat. -- Also in the last 6-8 weeks he had little black balls in his stool. Nurse Assessment Nurse: Dimas Chyle, RN, Dellis Filbert Date/Time Eilene Ghazi Time): 11/07/2015 1:27:04 PM Confirm and document reason for call. If symptomatic, describe symptoms. You must click the next button to save text entered. ---Caller states, this morning while walking, he felt like something was stuck in his esophagus. He started coughing up a little blood, then he finally coughed up a piece of meat. -- Also in the last 6-8 weeks he had little black balls in his stool. Upper GI done 02/14/15. Has the patient traveled out of the country within the last 30 days? ---No Does the patient have any new or worsening symptoms? ---Yes Will a triage be completed? ---Yes Related visit to physician within the last 2 weeks? ---No Does the PT have any chronic conditions? (i.e. diabetes, asthma, etc.) ---Yes List chronic conditions. ---Dysphagia Is this a behavioral health or substance abuse call? ---No Guidelines Guideline Title Affirmed Question Affirmed Notes Swallowing Difficulty Difficulty swallowing is a chronic symptom (recurrent or ongoing AND present > 4 weeks) Rectal Bleeding Age > 50 years Final Disposition User See PCP within [place], RN, Dellis Filbert Comments Caller already has appointment scheduled for 11/12/15 with PCP. Disagree/Comply: Comply Disagree/Comply: Comply

## 2015-11-07 NOTE — ED Provider Notes (Signed)
CSN: [id number]     Arrival date & time 11/07/15  1520 History   First MD Initiated Contact with Patient 11/07/15 1732     Chief Complaint  Patient presents with  . Blood In Stools     (Consider location/radiation/quality/duration/timing/severity/associated sxs/prior Treatment) Patient is a 75 y.o. male presenting with general illness. The history is provided by the patient and medical records.  Illness Location:  'Hacking up' material Severity:  Mild Onset quality:  Sudden Duration: occurred at 7a. Timing:  Unable to specify Progression:  Resolved Chronicity:  New Context:  History of Barrett's esophagus. Supposed to follow up with GI on an outpatient basis. Today the patient was clearing his throat and then began to hack up material from his esophagus. Small piece of material that looks like me, per his report, with some scant blood. Now resolved. Has never done this before. No worsening of difficulty with swallowing and no trouble with breathing. No fevers or chills or infectious symptoms. Also mentions having some black pellets in his stool for the last couple of months. Associated symptoms: no abdominal pain, no chest pain, no congestion, no cough, no diarrhea, no fever, no headaches, no nausea, no shortness of breath, no sore throat and no vomiting     Past Medical History  Diagnosis Date  . Heart murmur     History of  . Mitral valve disease   . Hypertension   . Hemorrhoids   . Psoriasis   . Thyroiditis   . Rheumatic heart valve incompetence   . Pain syndrome, chronic     Severe  . Myalgia     Multiple  . Gallstones   . Barrett esophagus   . Neuromuscular disorder ([place])   . Shortness of breath dyspnea   . Pneumonia     three times, last time >10 years ago  . Hypothyroidism   . GERD (gastroesophageal reflux disease)   . Depression    Past Surgical History  Procedure Laterality Date  . Back surgery    . Hemorroidectomy    . Upper gi endoscopy  03-06-2013    Dr  Donnella Sham  . Colonoscopy  03-08-12    Dr Donnella Sham  . Cholecystectomy  08/03/13  . Esophagogastroduodenoscopy (egd) with propofol N/A 02/18/2015    Procedure: ESOPHAGOGASTRODUODENOSCOPY (EGD) WITH PROPOFOL;  Surgeon: Lollie Sails, MD;  Location: [facility] ENDOSCOPY;  Service: Endoscopy;  Laterality: N/A;  . Cardiac catheterization N/A 02/22/2015    Procedure: Left Heart Cath and Coronary Angiography;  Surgeon: Minna Merritts, MD;  Location: [address] CV LAB;  Service: Cardiovascular;  Laterality: N/A;  . Esophageal manometry N/A 02/27/2015    Procedure: ESOPHAGEAL MANOMETRY (EM);  Surgeon: Lollie Sails, MD;  Location: [facility] ENDOSCOPY;  Service: Endoscopy;  Laterality: N/A;   Family History  Problem Relation Age of Onset  . Heart failure Mother   . Heart failure Father   . Cancer Sister   . Aneurysm Sister     Brain   Social History  Substance Use Topics  . Smoking status: Former Smoker -- 1.00 packs/day for 30 years    Quit date: 06/22/2004  . Smokeless tobacco: Never Used  . Alcohol Use: No    Review of Systems  Constitutional: Negative for fever and chills.  HENT: Positive for trouble swallowing (c/w baseline (on chronic aspiration diet)). Negative for congestion and sore throat.   Respiratory: Negative for cough and shortness of breath.   Cardiovascular: Negative for chest pain.  Gastrointestinal: Negative for  nausea, vomiting, abdominal pain and diarrhea.       Black pellets in stool  Musculoskeletal: Negative for back pain.  Neurological: Negative for light-headedness and headaches.  All other systems reviewed and are negative.     Allergies  Cymbalta; Nsaids; Rosuvastatin; and Statins  Home Medications   Prior to Admission medications   Medication Sig Start Date End Date Taking? Authorizing Provider  albuterol (PROVENTIL HFA;VENTOLIN HFA) 108 (90 Base) MCG/ACT inhaler Inhale 2 puffs into the lungs every 6 (six) hours as needed for wheezing or shortness of breath.  07/23/15  Yes Jackolyn Confer, MD  calcipotriene (DOVONEX) 0.005 % cream Apply 1 application topically daily as needed.    Yes Historical Provider, MD  celecoxib (CELEBREX) 200 MG capsule Take 1 capsule (200 mg total) by mouth 2 (two) times daily. 10/17/15  Yes Jackolyn Confer, MD  cetirizine (ZYRTEC) 10 MG tablet Take 10 mg by mouth daily as needed for allergies.    Yes Historical Provider, MD  Cholecalciferol (VITAMIN D-3 PO) Take 1,000 Units by mouth 2 (two) times daily.    Yes Historical Provider, MD  clobetasol (TEMOVATE) 0.05 % cream Apply topically as needed.    Yes Historical Provider, MD  clonazePAM (KLONOPIN) 2 MG tablet Take 1 tablet (2 mg total) by mouth 3 (three) times daily as needed (severe muscle spasm). 10/17/15  Yes Jackolyn Confer, MD  colesevelam (WELCHOL) 625 MG tablet TAKE 3 TABLETS TWICE DAILY WITH MEALS (NEED TO CONTACT OFFICE TO SCHEDULE FUTURE APPOINTMENT AND REFILLS) Patient taking differently: TAKE 3 TABLETS BY MOUTH IN THE EVENING 03/11/15  Yes Minna Merritts, MD  colestipol (COLESTID) 1 G tablet Take 3 tablets (3 g total) by mouth 2 (two) times daily. 03/19/15  Yes Minna Merritts, MD  escitalopram (LEXAPRO) 10 MG tablet Take 1 tablet (10 mg total) by mouth daily. 10/17/15  Yes Jackolyn Confer, MD  esomeprazole (NEXIUM) 40 MG packet Take 40 mg by mouth daily before breakfast. Patient taking differently: Take 40 mg by mouth at bedtime.  10/17/15  Yes Jackolyn Confer, MD  ezetimibe (ZETIA) 10 MG tablet Take 1 tablet (10 mg total) by mouth daily. 10/17/15  Yes Jackolyn Confer, MD  finasteride (PROSCAR) 5 MG tablet Take 5 mg by mouth daily.     Yes Historical Provider, MD  Glucosamine-Chondroitin (OSTEO BI-FLEX REGULAR STRENGTH) 250-200 MG TABS Take 1 tablet by mouth daily.    Yes Historical Provider, MD  levothyroxine (SYNTHROID, LEVOTHROID) 75 MCG tablet TAKE 1 TABLET DAILY Patient taking differently: Take 75 mcg by mouth every morning 03/25/15  Yes Jackolyn Confer, MD  Magnesium 250 MG TABS Take 1 tablet by mouth daily with breakfast.    Yes Historical Provider, MD  metoprolol tartrate (LOPRESSOR) 25 MG tablet Take 25 mg by mouth 2 (two) times daily as needed. As needed for palpitations   Yes Historical Provider, MD  Multiple Vitamins-Minerals (CENTRUM SILVER PO) Take 1 tablet by mouth every evening.    Yes Historical Provider, MD  Omega-3 Fatty Acids (FISH OIL) 1000 MG CAPS Take 1,000 mg by mouth 2 (two) times daily.    Yes Historical Provider, MD  Red Yeast Rice Extract (RED YEAST RICE PO) Take 1 capsule by mouth 2 (two) times daily.   Yes Historical Provider, MD  Testosterone (AXIRON) 30 MG/ACT SOLN Apply 1 pump under each armpit every night   Yes Historical Provider, MD   BP 154/74 mmHg  Pulse 78  Temp(Src)  98 F (36.7 C) (Oral)  Resp 18  SpO2 100% Physical Exam  Constitutional: He is oriented to person, place, and time. He appears well-developed and well-nourished. No distress.  HENT:  Head: Normocephalic and atraumatic.  Mouth/Throat: No oral lesions.  No active bleeding identified.  Eyes: EOM are normal. Pupils are equal, round, and reactive to light.  Neck: No tracheal tenderness present. No rigidity. No tracheal deviation present.  No tenderness palpation of his anterior neck.  Cardiovascular: Normal rate, regular rhythm and intact distal pulses.   Pulmonary/Chest: Effort normal. No respiratory distress.  Abdominal: Soft. There is no tenderness. There is no rebound, no guarding, no CVA tenderness, no tenderness at McBurney's point and negative Murphy's sign.  Musculoskeletal: He exhibits no edema.  Neurological: He is alert and oriented to person, place, and time.  Skin: Skin is warm and dry.    ED Course  Procedures (including critical care time) Labs Review Labs Reviewed  COMPREHENSIVE METABOLIC PANEL - Abnormal; Notable for the following:    Glucose, Bld 123 (*)    Creatinine, Ser 1.33 (*)    GFR calc non Af Amer 52 (*)     All other components within normal limits  CBC  POC OCCULT BLOOD, ED    Imaging Review Dg Chest 2 View  11/07/2015  CLINICAL DATA:  Bloody stools for 8 weeks. Recently with food bolus stuck in throat which he was able to cough up. History of mitral valve disease, hypertension, Barrett's esophagus. EXAM: CHEST  2 VIEW COMPARISON:  Chest x-ray dated 02/06/2015. FINDINGS: Heart size is normal. Overall cardiomediastinal silhouette is stable in size and configuration. Lungs are clear. No pleural effusion or pneumothorax seen. Mild degenerative spurring again noted within the slightly kyphotic thoracic spine. No acute -appearing osseous abnormality. IMPRESSION: Lungs are clear and there is no evidence of acute cardiopulmonary abnormality. Electronically Signed   By: Franki Cabot M.D.   On: 11/07/2015 19:23   I have personally reviewed and evaluated these images and lab results as part of my medical decision-making.   EKG Interpretation None      MDM   Final diagnoses:  Dysphagia    Severity 3-year-old male with a history of Barrett's esophagus, chronic dysphagia, presenting after "hacking up a piece of meat." Patient also has a history of having some black small pea-shaped objects in his stool. He called his primary care doctor. Beta concern for a GI bleed. Sent here. On arrival patient is well-appearing and in no acute distress. He denies any pain. Denies throat pain, chest pain, abdominal pain. No nausea, vomiting, diarrhea. Denies any hematochezia. Unable to see any active bleeding within his oropharynx. He has no tenderness palpation of his abdomen. No peritoneal signs. Doubt acute abdomen. He's had no fevers and chills. No infectious symptoms.  The patient showed me what he had coughed up. It does look like a piece of meat. The patient does not eat any meat at home so unclear where this could've come from except for the patient himself.  Hemoccult test was negative. Hemoglobin then was  greater than 15, higher than prior presentations to the emergency department. Lower suspicion for a clinically significant GI bleed at this time. Patient is already on the maximal dose of Nexium. Chest x-ray doesn't show any acute cardiopulmonary disease. Creatinine appears to be consistent with his baseline. Electrolytes are normal. No leukocytosis.  Patient supposed to follow up with gastroenterology on an outpatient basis. He would prefer to follow up with [facility]  gastroenterology. Unable to reach them on the phone. Provided him information to also contact [facility] gastroenterology should he not be able to get in with [facility]. Feel that he will likely require outpatient endoscopy for further evaluation. No evidence of necessity for emergent endoscopy at this time.  Encouraged the patient to follow up with his primary care doctor in 2-3 days for reevaluation and hemoglobin recheck to determine if he is having continual bleed.  Strict return precautions provided. Patient discharged in stable condition.  Maryan Puls, MD 11/07/15 [place], MD 11/07/15 [phone]

## 2015-11-08 NOTE — Telephone Encounter (Signed)
I spoke with Janett Billow at [facility] and sending note to [employer].

## 2015-11-08 NOTE — Telephone Encounter (Signed)
Same note form yesterday, was received.  Patient was advised to go to the ED.  See notes in the chart. thanks

## 2015-11-12 ENCOUNTER — Ambulatory Visit (INDEPENDENT_AMBULATORY_CARE_PROVIDER_SITE_OTHER): Payer: Medicare Other | Admitting: Internal Medicine

## 2015-11-12 ENCOUNTER — Telehealth: Payer: Self-pay | Admitting: Gastroenterology

## 2015-11-12 ENCOUNTER — Encounter: Payer: Self-pay | Admitting: Internal Medicine

## 2015-11-12 VITALS — BP 104/62 | HR 68 | Ht 72.0 in | Wt 180.0 lb

## 2015-11-12 DIAGNOSIS — R1312 Dysphagia, oropharyngeal phase: Secondary | ICD-10-CM | POA: Diagnosis not present

## 2015-11-12 NOTE — Progress Notes (Signed)
Dr. Gilford Rile,  I tried to see what I could do to get patient in.  See below message and advise.    Rachel Bo, I reviewed the chart and he is a Dr Gustavo Lah pt, he should follow up with that office. After Dr Loletha Carrow reviews the records and he accepts the pt then he can be seen here but needs follow up now with [facility].

## 2015-11-12 NOTE — Telephone Encounter (Signed)
I reviewed the records he left for [place] (with the exception of the MRI brain, which I am not trained to read).  He appears to have had a complete workup for his swallowing difficulty, which then led to a neurology evaluation.  If he is hoping that I can find find and fix the cause for his swallowing difficulty, I am afraid I cannot do that.  The testing is complete and it appears to be a condition that a GI doctor cannot fix.  However, it seems he has lost a lot of weight as a result.  So if he wants to see me regarding a possible feeding tube, then I am happy to see him as an office consult.

## 2015-11-12 NOTE — Patient Instructions (Addendum)
We will set up evaluation with GI.  Follow up here in 4 weeks.

## 2015-11-12 NOTE — Progress Notes (Signed)
Please see message!  Records are on Dr. Loletha Carrow desk for review - pt is coughing/vomitting up chunks of tissue like material from esophagus.

## 2015-11-12 NOTE — Telephone Encounter (Signed)
I will review it as soon as I am able

## 2015-11-12 NOTE — Telephone Encounter (Signed)
PCP states that patient refuses to go to Dr. Donnella Sham; patient has filed a Tour[employer] information centre manager thru insurance with that MD and office.  PCP wishes for Dr. Loletha Carrow to review records and advise on patient please.

## 2015-11-12 NOTE — Progress Notes (Signed)
Subjective:    Patient ID: Timothy Turner, male    DOB: [birth year], 75 y.o.   MRN: [id number]  HPI  75YO male presents for follow up.  Recently seen in ER after coughing up some solid material.  5/18 was walking and suddenly developed coughing. Felt like something trapped in throat. Coughed up piece of about 2cm wide piece of body tissue. Went to ER for evaluation. Was instructed to see GI asap for endoscopy. No further material see. Some bleeding initially, but this resolved. He has not eaten any solid foods in nearly 1 year because of dysphagia.  Taking Nexium powder. No abdominal pain.  Wt Readings from Last 3 Encounters:  11/12/15 180 lb (81.647 kg)  10/17/15 180 lb (81.647 kg)  07/23/15 172 lb 6 oz (78.189 kg)   BP Readings from Last 3 Encounters:  11/12/15 104/62  11/07/15 154/74  10/17/15 112/70    Past Medical History  Diagnosis Date  . Heart murmur     History of  . Mitral valve disease   . Hypertension   . Hemorrhoids   . Psoriasis   . Thyroiditis   . Rheumatic heart valve incompetence   . Pain syndrome, chronic     Severe  . Myalgia     Multiple  . Gallstones   . Barrett esophagus   . Neuromuscular disorder ([place])   . Shortness of breath dyspnea   . Pneumonia     three times, last time >10 years ago  . Hypothyroidism   . GERD (gastroesophageal reflux disease)   . Depression    Family History  Problem Relation Age of Onset  . Heart failure Mother   . Heart failure Father   . Cancer Sister   . Aneurysm Sister     Brain   Past Surgical History  Procedure Laterality Date  . Back surgery    . Hemorroidectomy    . Upper gi endoscopy  03-06-2013    Dr Donnella Sham  . Colonoscopy  03-08-12    Dr Donnella Sham  . Cholecystectomy  08/03/13  . Esophagogastroduodenoscopy (egd) with propofol N/A 02/18/2015    Procedure: ESOPHAGOGASTRODUODENOSCOPY (EGD) WITH PROPOFOL;  Surgeon: Lollie Sails, MD;  Location: [facility] ENDOSCOPY;  Service: Endoscopy;   Laterality: N/A;  . Cardiac catheterization N/A 02/22/2015    Procedure: Left Heart Cath and Coronary Angiography;  Surgeon: Minna Merritts, MD;  Location: [place] CV LAB;  Service: Cardiovascular;  Laterality: N/A;  . Esophageal manometry N/A 02/27/2015    Procedure: ESOPHAGEAL MANOMETRY (EM);  Surgeon: Lollie Sails, MD;  Location: [facility] ENDOSCOPY;  Service: Endoscopy;  Laterality: N/A;   Social History   Social History  . Marital Status: Married    Spouse Name: N/A  . Number of Children: 2  . Years of Education: N/A   Occupational History  . 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 Smoking status: Former Smoker -- 1.00 packs/day for 30 years    Quit date: 06/22/2004  . Smokeless tobacco: Never Used  . Alcohol Use: No  . Drug Use: No  . Sexual Activity: Not Asked   Other Topics Concern  . None   Social History Narrative   Drinks 4-5 cups of coffee a day and 3-4 cans of soda a day.    Review of Systems  Constitutional: Negative for fever, chills, activity change, appetite change, fatigue and unexpected weight change.  HENT: Positive  for trouble swallowing.   Eyes: Negative for visual disturbance.  Respiratory: Negative for cough and shortness of breath.   Cardiovascular: Negative for chest pain, palpitations and leg swelling.  Gastrointestinal: Positive for vomiting. Negative for nausea, abdominal pain, diarrhea, constipation and abdominal distention.  Genitourinary: Negative for dysuria, urgency and difficulty urinating.  Musculoskeletal: Negative for myalgias, arthralgias and gait problem.  Skin: Negative for color change and rash.  Hematological: Negative for adenopathy.  Psychiatric/Behavioral: Negative for suicidal ideas, sleep disturbance and dysphoric mood. The patient is not nervous/anxious.        Objective:    BP 104/62 mmHg  Pulse 68  Ht 6' (1.829 m)  Wt 180 lb (81.647 kg)  BMI 24.41 kg/m2  SpO2  95% Physical Exam  Constitutional: He is oriented to person, place, and time. He appears well-developed and well-nourished. No distress.  HENT:  Head: Normocephalic and atraumatic.  Right Ear: External ear normal.  Left Ear: External ear normal.  Nose: Nose normal.  Mouth/Throat: Oropharynx is clear and moist. No oropharyngeal exudate.  Eyes: Conjunctivae and EOM are normal. Pupils are equal, round, and reactive to light. Right eye exhibits no discharge. Left eye exhibits no discharge. No scleral icterus.  Neck: Normal range of motion. Neck supple. No tracheal deviation present. No thyromegaly present.  Cardiovascular: Normal rate, regular rhythm and normal heart sounds.  Exam reveals no gallop and no friction rub.   No murmur heard. Pulmonary/Chest: Effort normal and breath sounds normal. No accessory muscle usage. No tachypnea. No respiratory distress. He has no decreased breath sounds. He has no wheezes. He has no rhonchi. He has no rales. He exhibits no tenderness.  Musculoskeletal: Normal range of motion. He exhibits no edema.  Lymphadenopathy:    He has no cervical adenopathy.  Neurological: He is alert and oriented to person, place, and time. No cranial nerve deficit. Coordination normal.  Skin: Skin is warm and dry. No rash noted. He is not diaphoretic. No erythema. No pallor.  Psychiatric: He has a normal mood and affect. His behavior is normal. Judgment and thought content normal.          Assessment & Plan:   Problem List Items Addressed This Visit      Unprioritized   Dysphagia, oropharyngeal phase - Primary    Dysphagia over the last year which has forced him to puree all foods. Endoscopy in 2016 unrevealing as to cause. Recent episode in which he vomited or coughed up an approx 2cm chuck of tissue. Will set up GI evaluation asap for repeat endoscopy. Reviewed ER notes and lab work. He understands potential risks and need to seek care immediately if any further vomiting  of bloody tissue.          Return in about 4 weeks (around 12/10/2015) for Recheck.  Ronette Deter, MD Internal Medicine [facility]

## 2015-11-12 NOTE — Telephone Encounter (Signed)
Dr Loletha Carrow, the records are on your desk for review.

## 2015-11-12 NOTE — Assessment & Plan Note (Signed)
Dysphagia over the last year which has forced him to puree all foods. Endoscopy in 2016 unrevealing as to cause. Recent episode in which he vomited or coughed up an approx 2cm chuck of tissue. Will set up GI evaluation asap for repeat endoscopy. Reviewed ER notes and lab work. He understands potential risks and need to seek care immediately if any further vomiting of bloody tissue.

## 2015-11-28 ENCOUNTER — Encounter: Payer: Self-pay | Admitting: Gastroenterology

## 2015-11-28 ENCOUNTER — Ambulatory Visit (INDEPENDENT_AMBULATORY_CARE_PROVIDER_SITE_OTHER): Payer: Medicare Other | Admitting: Gastroenterology

## 2015-11-28 VITALS — BP 130/82 | HR 64 | Ht 72.0 in | Wt 176.6 lb

## 2015-11-28 DIAGNOSIS — K920 Hematemesis: Secondary | ICD-10-CM

## 2015-11-28 DIAGNOSIS — R634 Abnormal weight loss: Secondary | ICD-10-CM

## 2015-11-28 DIAGNOSIS — R1314 Dysphagia, pharyngoesophageal phase: Secondary | ICD-10-CM

## 2015-11-28 NOTE — Patient Instructions (Signed)
If you are age 73 or older, your body mass index should be between 23-30. Your Body mass index is 23.95 kg/(m^2). If this is out of the aforementioned range listed, please consider follow up with your Primary Care Provider.  If you are age 84 or younger, your body mass index should be between 19-25. Your Body mass index is 23.95 kg/(m^2). If this is out of the aformentioned range listed, please consider follow up with your Primary Care Provider.   You have been scheduled for an endoscopy. Please follow written instructions given to you at your visit today. If you use inhalers (even only as needed), please bring them with you on the day of your procedure. Your physician has requested that you go to www.startemmi.com and enter the access code given to you at your visit today. This web site gives a general overview about your procedure. However, you should still follow specific instructions given to you by our office regarding your preparation for the procedure.  Thank you for choosing [facility]  Dr Wilfrid Lund III

## 2015-11-28 NOTE — Progress Notes (Signed)
Referring MD: Ronette Deter, MD  Reason for consultation/chief complaint:  Dysphagia and hematemesis  HPI: This is a 75 year old man seen as a second opinion consultation at the request of his PCP Dr. Gilford Rile. I have reviewed extensive records from his workup with Dr. Loistine Simas. Patient reported that he ultimately had bad interactions with Dr. Monika Salk study, did not feel he was getting consistent and reliable information, and did not wish to return to him. In fact, he told me that whatever records are received regarding that physician's care should be thrown out because they were all lies. Briefly, Timothy Turner has had well over a year of chronic oro- pharyngeal dysphagia, where solid food will feel stuck in the neck and he may have to bring it back up. As a result, he is subsisting on a liquid diet, he is lost at least 30 pounds in the last year, as stated adamantly that he does not want a PEG placement. He seems to be under the impression that on his upper endoscopy last year, that there was a "muscle that was torn" of the procedure itself. There is no note of any such finding recurrence on the upper endoscopy report. 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 Patient had a modified barium study which showed oropharyngeal dysphagia with aspiration. The patient has had neurological evaluation and does not appear to have a specific neurologic diagnosis. He also reports having seen an ENT physician with no specific findings, and thinks he may get another opinion there as well. Would bothered him and his wife more recently was an episode where some food got stuck, which then caused him to pack and cough and bring up a piece of "meat". This repeated hacking also brought up some blood, and he came to the local ED. The patient believes very strongly that whatever he brought up must of been  this muscle that was damage during his original EGD. At any rate, his dysphagia continues and his weight loss as well. He has had no further episodes like the one that brought him to the ED last month.  He wants to know why he cannot have a fundoplication for his symptoms of regurgitation that occur when he bends over or at bedtime, and why he cannot have either Botox or a "muscle cutting surgery" for his oropharyngeal swallowing dysfunction.  ROS:  Review of Systems  Constitutional: Positive for unexpected weight change. Negative for appetite change.  HENT: Negative for mouth sores and voice change.   Eyes: Negative for pain and redness.  Respiratory: Negative for cough and shortness of breath.   Cardiovascular: Negative for chest pain and palpitations.  Genitourinary: Negative for dysuria and hematuria.  Musculoskeletal: Negative for myalgias and arthralgias.  Skin: Negative for pallor and rash.  Neurological: Negative for weakness and headaches.  Hematological: Negative for adenopathy.  Psychiatric/Behavioral: Positive for dysphoric mood.     Past Medical History: Past Medical History  Diagnosis Date  . Heart murmur     History of  . Mitral valve disease   . Hypertension   . Hemorrhoids   . Psoriasis   . Thyroiditis   . Rheumatic heart valve incompetence   . Pain syndrome, chronic     Severe  . Myalgia     Multiple  . Gallstones   . Barrett esophagus   . Neuromuscular disorder ([place])   . Shortness of breath dyspnea   .  Pneumonia     three times, last time >10 years ago  . Hypothyroidism   . GERD (gastroesophageal reflux disease)   . Depression      Past Surgical History: Past Surgical History  Procedure Laterality Date  . Back surgery    . Hemorroidectomy    . Upper gi endoscopy  03-06-2013    Dr Donnella Sham  . Colonoscopy  03-08-12    Dr Donnella Sham  . Cholecystectomy  08/03/13  . Esophagogastroduodenoscopy (egd) with propofol N/A 02/18/2015    Procedure:  ESOPHAGOGASTRODUODENOSCOPY (EGD) WITH PROPOFOL;  Surgeon: Lollie Sails, MD;  Location: [facility] ENDOSCOPY;  Service: Endoscopy;  Laterality: N/A;  . Cardiac catheterization N/A 02/22/2015    Procedure: Left Heart Cath and Coronary Angiography;  Surgeon: Minna Merritts, MD;  Location: [place] CV LAB;  Service: Cardiovascular;  Laterality: N/A;  . Esophageal manometry N/A 02/27/2015    Procedure: ESOPHAGEAL MANOMETRY (EM);  Surgeon: Lollie Sails, MD;  Location: [facility] ENDOSCOPY;  Service: Endoscopy;  Laterality: N/A;     Family History: Family History  Problem Relation Age of Onset  . Heart failure Mother   . Heart failure Father   . Cancer Sister   . Aneurysm Sister     Brain    Social History: Social History   Social History  . Marital Status: Married    Spouse Name: N/A  . Number of Children: 2  . Years of Education: N/A   Occupational History  . 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 Smoking status: Former Smoker -- 1.00 packs/day for 30 years    Quit date: 06/22/2004  . Smokeless tobacco: Never Used  . Alcohol Use: No  . Drug Use: No  . Sexual Activity: Not Asked   Other Topics Concern  . None   Social History Narrative   Drinks 4-5 cups of coffee a day and 3-4 cans of soda a day.    Allergies: Allergies  Allergen Reactions  . Cymbalta [Duloxetine Hcl]     Unable to urinate  . Nsaids Other (See Comments)    CANNOT TAKE NSAIDS BECAUSE OF ESOPHAGUS ISSUES  . Rosuvastatin Other (See Comments)    SEVERE MUSCLE ACHES  . Statins Other (See Comments)    SEVERE MUSCLE ACHES    Outpatient Meds: Current Outpatient Prescriptions  Medication Sig Dispense Refill  . albuterol (PROVENTIL HFA;VENTOLIN HFA) 108 (90 Base) MCG/ACT inhaler Inhale 2 puffs into the lungs every 6 (six) hours as needed for wheezing or shortness of breath. 3 Inhaler 4  . calcipotriene (DOVONEX) 0.005 % cream Apply 1 application  topically daily as needed.     . celecoxib (CELEBREX) 200 MG capsule Take 1 capsule (200 mg total) by mouth 2 (two) times daily. 180 capsule 1  . cetirizine (ZYRTEC) 10 MG tablet Take 10 mg by mouth daily as needed for allergies.     . Cholecalciferol (VITAMIN D-3 PO) Take 1,000 Units by mouth 2 (two) times daily.     . clobetasol (TEMOVATE) 0.05 % cream Apply topically as needed.     . clonazePAM (KLONOPIN) 2 MG tablet Take 1 tablet (2 mg total) by mouth 3 (three) times daily as needed (severe muscle spasm). 90 tablet 3  . colesevelam (WELCHOL) 625 MG tablet TAKE 3 TABLETS TWICE DAILY WITH MEALS (NEED TO CONTACT OFFICE TO SCHEDULE FUTURE APPOINTMENT AND REFILLS) (Patient taking differently: TAKE 3 TABLETS BY MOUTH IN  THE EVENING) 540 tablet 3  . colestipol (COLESTID) 1 G tablet Take 3 tablets (3 g total) by mouth 2 (two) times daily. 540 tablet 3  . escitalopram (LEXAPRO) 10 MG tablet Take 1 tablet (10 mg total) by mouth daily. 90 tablet 3  . esomeprazole (NEXIUM) 40 MG packet Take 40 mg by mouth daily before breakfast. (Patient taking differently: Take 40 mg by mouth at bedtime. ) 90 each 3  . ezetimibe (ZETIA) 10 MG tablet Take 1 tablet (10 mg total) by mouth daily. 90 tablet 1  . finasteride (PROSCAR) 5 MG tablet Take 5 mg by mouth daily.      . Glucosamine-Chondroitin (OSTEO BI-FLEX REGULAR STRENGTH) 250-200 MG TABS Take 1 tablet by mouth daily.     [name] levothyroxine (SYNTHROID, LEVOTHROID) 75 MCG tablet TAKE 1 TABLET DAILY (Patient taking differently: Take 75 mcg by mouth every morning) 90 tablet 2  . Magnesium 250 MG TABS Take 1 tablet by mouth daily with breakfast.     . Multiple Vitamins-Minerals (CENTRUM SILVER PO) Take 1 tablet by mouth every evening.     . Omega-3 Fatty Acids (FISH OIL) 1000 MG CAPS Take 1,000 mg by mouth 2 (two) times daily.     . Red Yeast Rice Extract (RED YEAST RICE PO) Take 1 capsule by mouth 2 (two) times daily.    . Testosterone (AXIRON) 30 MG/ACT SOLN Apply 1 pump  under each armpit every night     No current facility-administered medications for this visit.      ___________________________________________________________________ Objective  Exam:  BP 130/82 mmHg  Pulse 64  Ht 6' (1.829 m)  Wt 176 lb 9.6 oz (80.105 kg)  BMI 23.95 kg/m2   General: this is a(n) Thin elderly man accompanied by his wife. There is mild hoarseness of his voice. He is also quite clearly angry and frustrated.   Eyes: sclera anicteric, no redness  ENT: oral mucosa moist without lesions, no cervical or supraclavicular lymphadenopathy, good dentition  CV: RRR without murmur, S1/S2, no JVD, no peripheral edema  Resp: clear to auscultation bilaterally, normal RR and effort noted  GI: soft, no tenderness, with active bowel sounds. No guarding or palpable organomegaly noted.  Skin; warm and dry, no rash or jaundice noted  Neuro: awake, alert and oriented x 3. Normal gross motor function and fluent speech   Radiologic Studies:  In addition to the testing above, esophageal manometry apparently showed a hypotensive LES in addition to the 4 cm hiatal hernia. The esophageal motility was otherwise reportedly normal.  Assessment: Encounter Diagnoses  Name Primary?  [name] Dysphagia, pharyngoesophageal phase Yes  . Hematemesis without nausea   . Abnormal loss of weight     This patient clearly has oral pharyngeal dysphagia causing aspiration, and the cause is unknown after a thorough workup. When asked to see him by Dr. Gilford Rile, I reviewed the records and indicated that while I would be happy to see him, I doubted I would have any other diagnostic testing or therapy that would be helpful for this dysphagia. After evaluating him, this appears clearly to be the case. As for the more recent episode that is concerning to him and his wife where he had something gets document brought up a piece of tissue and some blood, it sounds like he may have had some pharyngeal trauma from a  piece of food getting stuck. Despite my reassurances that I do not believe that he brought up any "muscle that was damaged during an  endoscopy", and that he has not had another episode since then, he said he is very concerned because he does not trust any of the endoscopic findings are reports from the previous GI doctor.  His weight loss is bothersome, he felt strongly he does not want a PEG placement.  There are no indications that he has cricopharyngeal achalasia, thus I think there is no role for the Botox or surgery he is suggesting.  While he has significant regurgitation symptoms related to his hiatal hernia, I disagree with pursuing a fundoplication, given his chronic dysphagia and malnutrition.  Plan:  Upper endoscopy.  The benefits and risks of the planned procedure were described in detail with the patient or (when appropriate) their health care proxy.  Risks were outlined as including, but not limited to, bleeding, infection, perforation, adverse medication reaction leading to cardiac or pulmonary decompensation, or pancreatitis (if ERCP).  The limitation of incomplete mucosal visualization was also discussed.  No guarantees or warranties were given.   Thank you for the courtesy of this consult.  Please call me with any questions or concerns.  Timothy Turner  CC: Rica Mast, MD

## 2015-12-03 ENCOUNTER — Ambulatory Visit (AMBULATORY_SURGERY_CENTER): Payer: Medicare Other | Admitting: Gastroenterology

## 2015-12-03 ENCOUNTER — Encounter: Payer: Self-pay | Admitting: Gastroenterology

## 2015-12-03 VITALS — BP 125/68 | HR 52 | Temp 97.3°F | Resp 14 | Ht 72.0 in | Wt 176.0 lb

## 2015-12-03 DIAGNOSIS — R1319 Other dysphagia: Secondary | ICD-10-CM

## 2015-12-03 DIAGNOSIS — R1310 Dysphagia, unspecified: Secondary | ICD-10-CM

## 2015-12-03 DIAGNOSIS — R1314 Dysphagia, pharyngoesophageal phase: Secondary | ICD-10-CM

## 2015-12-03 MED ORDER — SODIUM CHLORIDE 0.9 % IV SOLN: 500.0000 mL | INTRAVENOUS | Status: DC

## 2015-12-03 NOTE — Progress Notes (Signed)
Report to PACU, RN, vss, BBS= Clear.  

## 2015-12-03 NOTE — Op Note (Signed)
[place] Patient Name: Timothy Turner Procedure Date: 12/03/2015 7:44 AM MRN: [id number] Endoscopist: Mallie Mussel L. Loletha Carrow , MD Age: 75 Referring MD:  Date of Birth: [birth year] Gender: Male Procedure:                Upper GI endoscopy Indications:              Oropharyngeal phase dysphagia, Hematemesis, see                            recent office consult for details Medicines:                Monitored Anesthesia Care Procedure:                Pre-Anesthesia Assessment:                           - Prior to the procedure, a History and Physical                            was performed, and patient medications and                            allergies were reviewed. The patient's tolerance of                            previous anesthesia was also reviewed. The risks                            and benefits of the procedure and the sedation                            options and risks were discussed with the patient.                            All questions were answered, and informed consent                            was obtained. Prior Anticoagulants: The patient has                            taken no previous anticoagulant or antiplatelet                            agents. ASA Grade Assessment: II - A patient with                            mild systemic disease. After reviewing the risks                            and benefits, the patient was deemed in                            satisfactory condition to undergo the procedure.  After obtaining informed consent, the endoscope was                            passed under direct vision. Throughout the                            procedure, the patient's blood pressure, pulse, and                            oxygen saturations were monitored continuously. The                            Model GIF-HQ190 [id number] scope was introduced                            through the mouth, and advanced to the  second part                            of duodenum. The upper GI endoscopy was                            accomplished without difficulty. The patient                            tolerated the procedure well. Scope In: Scope Out: Findings:                 A small hiatal hernia was present. The entire                            esophagus was otherwise normal.                           The stomach was normal.                           The cardia and gastric fundus were normal on                            retroflexion.                           The examined duodenum was normal.                           The larynx was normal. Complications:            No immediate complications. Estimated Blood Loss:     Estimated blood loss: none. Impression:               - Small hiatal hernia.                           - Normal stomach.                           - Normal examined duodenum.                           -  Normal larynx.                           - No specimens collected.                           No cause for reported hematemesis. May have been                            from acute pharyngeal mucosal trauma due to                            dysphagia-related coughing that has now healed. Recommendation:           - Patient has a contact number available for                            emergencies. The signs and symptoms of potential                            delayed complications were discussed with the                            patient. Return to normal activities tomorrow.                            Written discharge instructions were provided to the                            patient.                           - Resume previous diet.                           - Continue present medications. Henry L. Loletha Carrow, MD 12/03/2015 8:28:54 AM This report has been signed electronically.

## 2015-12-03 NOTE — Patient Instructions (Signed)
Discharge instructions given. Handout on a hiatal hernia. Normal exam. Resume previous medications. YOU HAD AN ENDOSCOPIC PROCEDURE TODAY AT [facility]:   Refer to the procedure report that was given to you for any specific questions about what was found during the examination.  If the procedure report does not answer your questions, please call your gastroenterologist to clarify.  If you requested that your care partner not be given the details of your procedure findings, then the procedure report has been included in a sealed envelope for you to review at your convenience later.  YOU SHOULD EXPECT: Some feelings of bloating in the abdomen. Passage of more gas than usual.  Walking can help get rid of the air that was put into your GI tract during the procedure and reduce the bloating. If you had a lower endoscopy (such as a colonoscopy or flexible sigmoidoscopy) you may notice spotting of blood in your stool or on the toilet paper. If you underwent a bowel prep for your procedure, you may not have a normal bowel movement for a few days.  Please Note:  You might notice some irritation and congestion in your nose or some drainage.  This is from the oxygen used during your procedure.  There is no need for concern and it should clear up in a day or so.  SYMPTOMS TO REPORT IMMEDIATELY:    Following upper endoscopy (EGD)  Vomiting of blood or coffee ground material  New chest pain or pain under the shoulder blades  Painful or persistently difficult swallowing  New shortness of breath  Fever of 100F or higher  Black, tarry-looking stools  For urgent or emergent issues, a gastroenterologist can be reached at any hour by calling [phone].   DIET: Your first meal following the procedure should be a small meal and then it is ok to progress to your normal diet. Heavy or fried foods are harder to digest and may make you feel nauseous or bloated.  Likewise, meals heavy in dairy  and vegetables can increase bloating.  Drink plenty of fluids but you should avoid alcoholic beverages for 24 hours.  ACTIVITY:  You should plan to take it easy for the rest of today and you should NOT DRIVE or use heavy machinery until tomorrow (because of the sedation medicines used during the test).    FOLLOW UP: Our staff will call the number listed on your records the next business day following your procedure to check on you and address any questions or concerns that you may have regarding the information given to you following your procedure. If we do not reach you, we will leave a message.  However, if you are feeling well and you are not experiencing any problems, there is no need to return our call.  We will assume that you have returned to your regular daily activities without incident.  If any biopsies were taken you will be contacted by phone or by letter within the next 1-3 weeks.  Please call us at [phone] if you have not heard about the biopsies in 3 weeks.    SIGNATURES/CONFIDENTIALITY: You and/or your care partner have signed paperwork which will be entered into your electronic medical record.  These signatures attest to the fact that that the information above on your After Visit Summary has been reviewed and is understood.  Full responsibility of the confidentiality of this discharge information lies with you and/or your care-partner.

## 2015-12-04 ENCOUNTER — Telehealth: Payer: Self-pay

## 2015-12-04 NOTE — Telephone Encounter (Signed)
  Follow up Call-  Call back number 12/03/2015  Post procedure Call Back phone  # [phone]  Permission to leave phone message Yes     Patient questions:  Do you have a fever, pain , or abdominal swelling? No. Pain Score  0 *  Have you tolerated food without any problems? Yes.    Have you been able to return to your normal activities? Yes.    Do you have any questions about your discharge instructions: Diet   No. Medications  No. Follow up visit  No.  Do you have questions or concerns about your Care? No.  Actions: * If pain score is 4 or above: No action needed, pain <4.

## 2015-12-18 ENCOUNTER — Ambulatory Visit (INDEPENDENT_AMBULATORY_CARE_PROVIDER_SITE_OTHER): Payer: Medicare Other | Admitting: Internal Medicine

## 2015-12-18 ENCOUNTER — Encounter: Payer: Self-pay | Admitting: Internal Medicine

## 2015-12-18 VITALS — BP 126/76 | HR 58 | Ht 72.0 in | Wt 176.6 lb

## 2015-12-18 DIAGNOSIS — R1312 Dysphagia, oropharyngeal phase: Secondary | ICD-10-CM

## 2015-12-18 NOTE — Patient Instructions (Signed)
We will set up evaluation with ENT.

## 2015-12-18 NOTE — Progress Notes (Signed)
Pre visit review using our clinic review tool, if applicable. No additional management support is needed unless otherwise documented below in the visit note. 

## 2015-12-18 NOTE — Progress Notes (Signed)
Subjective:    Patient ID: KAINALU LADY, male    DOB: 1941-03-18, 75 y.o.   MRN: [id number]  HPI  75YO male presents for follow up.  Recently seen for dysphagia. Had second opinion at [facility] and underwent EGD. This was normal except for small hiatal hernia.  Continues to have trouble swallowing. Pureeing all foods.   Wt Readings from Last 3 Encounters:  12/18/15 176 lb 9.6 oz (80.105 kg)  12/03/15 176 lb (79.833 kg)  11/28/15 176 lb 9.6 oz (80.105 kg)   BP Readings from Last 3 Encounters:  12/18/15 126/76  12/03/15 125/68  11/28/15 130/82    Past Medical History  Diagnosis Date  . Heart murmur     History of  . Mitral valve disease   . Hypertension   . Hemorrhoids   . Psoriasis   . Thyroiditis   . Rheumatic heart valve incompetence   . Pain syndrome, chronic     Severe  . Myalgia     Multiple  . Gallstones   . Barrett esophagus   . Neuromuscular disorder ([place])   . Shortness of breath dyspnea   . Pneumonia     three times, last time >10 years ago  . Hypothyroidism   . GERD (gastroesophageal reflux disease)   . Depression    Family History  Problem Relation Age of Onset  . Heart failure Mother   . Heart failure Father   . Cancer Sister   . Aneurysm Sister     Brain   Past Surgical History  Procedure Laterality Date  . Back surgery    . Hemorroidectomy    . Upper gi endoscopy  03-06-2013    Dr Donnella Sham  . Colonoscopy  03-08-12    Dr Donnella Sham  . Cholecystectomy  08/03/13  . Esophagogastroduodenoscopy (egd) with propofol N/A 02/18/2015    Procedure: ESOPHAGOGASTRODUODENOSCOPY (EGD) WITH PROPOFOL;  Surgeon: Lollie Sails, MD;  Location: [facility] ENDOSCOPY;  Service: Endoscopy;  Laterality: N/A;  . Cardiac catheterization N/A 02/22/2015    Procedure: Left Heart Cath and Coronary Angiography;  Surgeon: Minna Merritts, MD;  Location: [facility];  Service: Cardiovascular;  Laterality: N/A;  . Esophageal manometry N/A 02/27/2015    Procedure:  ESOPHAGEAL MANOMETRY (EM);  Surgeon: Lollie Sails, MD;  Location: [facility] ENDOSCOPY;  Service: Endoscopy;  Laterality: N/A;   Social History   Social History  . Marital Status: Married    Spouse Name: N/A  . Number of Children: 2  . Years of Education: N/A   Occupational History  . 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 Smoking status: Former Smoker -- 1.00 packs/day for 30 years    Quit date: 06/22/2004  . Smokeless tobacco: Never Used  . Alcohol Use: No  . Drug Use: No  . Sexual Activity: Not Asked   Other Topics Concern  . None   Social History Narrative   Drinks 4-5 cups of coffee a day and 3-4 cans of soda a day.    Review of Systems  Constitutional: Negative for fever, chills, activity change, appetite change, fatigue and unexpected weight change.  HENT: Positive for trouble swallowing.   Eyes: Negative for visual disturbance.  Respiratory: Negative for cough and shortness of breath.   Cardiovascular: Negative for chest pain, palpitations and leg swelling.  Gastrointestinal: Negative for nausea, vomiting, abdominal pain, diarrhea, constipation and abdominal distention.  Genitourinary: Negative  for dysuria, urgency and difficulty urinating.  Musculoskeletal: Negative for arthralgias and gait problem.  Skin: Negative for color change and rash.  Hematological: Negative for adenopathy.  Psychiatric/Behavioral: Negative for sleep disturbance and dysphoric mood. The patient is not nervous/anxious.        Objective:    BP 126/76 mmHg  Pulse 58  Ht 6' (1.829 m)  Wt 176 lb 9.6 oz (80.105 kg)  BMI 23.95 kg/m2  SpO2 95% Physical Exam  Constitutional: He is oriented to person, place, and time. He appears well-developed and well-nourished. No distress.  HENT:  Head: Normocephalic and atraumatic.  Right Ear: External ear normal.  Left Ear: External ear normal.  Nose: Nose normal.  Mouth/Throat: Oropharynx is clear  and moist. No oropharyngeal exudate.  Eyes: Conjunctivae and EOM are normal. Pupils are equal, round, and reactive to light. Right eye exhibits no discharge. Left eye exhibits no discharge. No scleral icterus.  Neck: Normal range of motion. Neck supple. No tracheal deviation present. No thyromegaly present.  Cardiovascular: Normal rate, regular rhythm and normal heart sounds.  Exam reveals no gallop and no friction rub.   No murmur heard. Pulmonary/Chest: Effort normal and breath sounds normal. No accessory muscle usage. No tachypnea. No respiratory distress. He has no decreased breath sounds. He has no wheezes. He has no rhonchi. He has no rales. He exhibits no tenderness.  Musculoskeletal: Normal range of motion. He exhibits no edema.  Lymphadenopathy:    He has no cervical adenopathy.  Neurological: He is alert and oriented to person, place, and time. No cranial nerve deficit. Coordination normal.  Skin: Skin is warm and dry. No rash noted. He is not diaphoretic. No erythema. No pallor.  Psychiatric: He has a normal mood and affect. His behavior is normal. Judgment and thought content normal.          Assessment & Plan:  Over 75min of which >50% spent in face-to-face contact with patient discussing plan of care.  Problem List Items Addressed This Visit      Unprioritized   Dysphagia, oropharyngeal phase - Primary    Persistent symptoms of dysphagia for over 1 year. Continues on pureed diet. Recent EGD was normal. Will get second opinion from ENT in [place]. Follow up with Dr. Caryl Bis.      Relevant Orders   Ambulatory referral to ENT       Return in about 4 weeks (around 01/15/2016) for New Patient.  Ronette Deter, MD [facility]

## 2015-12-18 NOTE — Assessment & Plan Note (Signed)
Persistent symptoms of dysphagia for over 1 year. Continues on pureed diet. Recent EGD was normal. Will get second opinion from ENT in [place]. Follow up with Dr. Caryl Bis.

## 2016-01-14 DIAGNOSIS — T17908A Unspecified foreign body in respiratory tract, part unspecified causing other injury, initial encounter: Secondary | ICD-10-CM | POA: Insufficient documentation

## 2016-01-17 ENCOUNTER — Ambulatory Visit: Payer: Medicare Other | Admitting: Family Medicine

## 2016-01-17 ENCOUNTER — Other Ambulatory Visit: Payer: Self-pay | Admitting: Internal Medicine

## 2016-01-23 ENCOUNTER — Encounter: Payer: Self-pay | Admitting: Family Medicine

## 2016-01-23 ENCOUNTER — Ambulatory Visit (INDEPENDENT_AMBULATORY_CARE_PROVIDER_SITE_OTHER): Payer: Medicare Other | Admitting: Family Medicine

## 2016-01-23 ENCOUNTER — Telehealth: Payer: Self-pay | Admitting: *Deleted

## 2016-01-23 VITALS — BP 128/86 | HR 59 | Temp 97.7°F | Ht 72.0 in | Wt 174.6 lb

## 2016-01-23 DIAGNOSIS — R1033 Periumbilical pain: Secondary | ICD-10-CM | POA: Diagnosis not present

## 2016-01-23 DIAGNOSIS — R0600 Dyspnea, unspecified: Secondary | ICD-10-CM

## 2016-01-23 DIAGNOSIS — R1312 Dysphagia, oropharyngeal phase: Secondary | ICD-10-CM | POA: Diagnosis not present

## 2016-01-23 NOTE — Progress Notes (Signed)
Pre visit review using our clinic review tool, if applicable. No additional management support is needed unless otherwise documented below in the visit note. 

## 2016-01-23 NOTE — Telephone Encounter (Signed)
Please advise a 30 min follow up visit, in one month for pt. He requested a morning appt.

## 2016-01-23 NOTE — Assessment & Plan Note (Signed)
Continues to have issues with dysphagia. Continuing pured diet. Has improved somewhat with splitting his Nexium dose. He will continue to follow with ENT for further evaluation and possible intervention.

## 2016-01-23 NOTE — Telephone Encounter (Signed)
Patient scheduled.

## 2016-01-23 NOTE — Progress Notes (Signed)
[name], MD Phone: [phone]  Timothy Turner is a 75 y.o. male who presents today for follow-up.  Dysphagia: This is a chronic issue. He has seen multiple GI physicians. He has seen neurology. No obvious cause at this time. He is recently seen ENT and they had him split his Nexium into 2 doses and this is been beneficial. Still with issues swallowing. Only does liquids. They are planning to do a laryngoscopy and esophagoscopy to evaluate further. Considering Botox injections if needed.  Dyspnea: This is a chronic issue as well. He saw cardiology and had a negative catheterization. 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 States his breathing is terrible. If he bends his head over he has trouble breathing. If he bends over at the waist he has trouble breathing. No trouble breathing with walking up a hill. No fevers or cough or chest pain with this. There was potentially chronic infection in his lungs seen on CT scan of his abdomen and pelvis. Of note he had a benign x-ray of his chest previously.  Patient notes chronic soreness in his abdomen just superior to his umbilicus for the last 8-9 months. Notes it is sore that was previously. Only hurts if he leans on his abdomen or if he presses deeply. Does have diarrhea. No vomiting. He had a CT scan that was unremarkable for cause in December.  PMH: Former smoker   ROS see history of present illness  Objective  Physical Exam Vitals:   01/23/16 0755  BP: 128/86  Pulse: (!) 59  Temp: 97.7 F (36.5 C)    BP Readings from Last 3 Encounters:  01/23/16 128/86  12/18/15 126/76  12/03/15 125/68   Wt Readings from Last 3 Encounters:  01/23/16 174 lb 9.6 oz (79.2 kg)  12/18/15 176 lb 9.6 oz (80.1 kg)  12/03/15 176 lb (79.8 kg)    Physical Exam  Constitutional: No distress.  HENT:  Head: Normocephalic and atraumatic.  Mouth/Throat: Oropharynx is clear and moist. No oropharyngeal exudate.    Eyes: Conjunctivae are normal. Pupils are equal, round, and reactive to light.  Neck: Neck supple.  Cardiovascular: Normal rate, regular rhythm and normal heart sounds.   Pulmonary/Chest: Effort normal and breath sounds normal.  Abdominal: Soft. Bowel sounds are normal. He exhibits no distension. There is tenderness (superior to the umbilicus). There is no rebound and no guarding.  Lymphadenopathy:    He has no cervical adenopathy.  Neurological: He is alert. Gait normal.  Skin: Skin is warm and dry. He is not diaphoretic.     Assessment/Plan: Please see individual problem list.  Dyspnea Patient's description of his dyspnea is somewhat odd. Does bother him on exertion though also with bending his neck and bending over. He has had a negative catheterization. Unlikely cardiac in nature. Possibly has mild COPD. Had a benign chest x-ray earlier this year. We are going to refer to a different pulmonologist for a second opinion. He is given return precautions.  Dysphagia, oropharyngeal phase Continues to have issues with dysphagia. Continuing pured diet. Has improved somewhat with splitting his Nexium dose. He will continue to follow with ENT for further evaluation and possible intervention.  Periumbilical abdominal pain Continues to have a soreness superior to his umbilicus only when he leans forward and leans on it or if someone presses in this area. No other abdominal discomfort. Does have some diarrhea. Had a CT scan that was unrevealing of cause. Could be muscular  skeletal issue. We will have him reevaluated by his GI physician to get their input. We'll have our referral coordinator call to try to schedule this appointment. He is given return precautions.   [name], MD [facility]

## 2016-01-23 NOTE — Assessment & Plan Note (Signed)
Continues to have a soreness superior to his umbilicus only when he leans forward and leans on it or if someone presses in this area. No other abdominal discomfort. Does have some diarrhea. Had a CT scan that was unrevealing of cause. Could be muscular skeletal issue. We will have him reevaluated by his GI physician to get their input. We'll have our referral coordinator call to try to schedule this appointment. He is given return precautions.

## 2016-01-23 NOTE — Assessment & Plan Note (Signed)
Patient's description of his dyspnea is somewhat odd. Does bother him on exertion though also with bending his neck and bending over. He has had a negative catheterization. Unlikely cardiac in nature. Possibly has mild COPD. Had a benign chest x-ray earlier this year. We are going to refer to a different pulmonologist for a second opinion. He is given return precautions.

## 2016-01-23 NOTE — Patient Instructions (Addendum)
Nice to meet you. Please follow up with ENT. Please follow up with your GI physician as well. Please call to schedule an appointment with them for follow-up. We will refer you to pulmonology at [facility] for further evaluation of your breathing issue. If you develop worsening abdominal discomfort, blood in her stool, worsening trouble breathing, chest pain, or any new or change in symptoms please seek medical attention.

## 2016-01-27 ENCOUNTER — Other Ambulatory Visit: Payer: Self-pay | Admitting: Surgical

## 2016-01-27 DIAGNOSIS — M791 Myalgia, unspecified site: Secondary | ICD-10-CM

## 2016-01-27 MED ORDER — CLONAZEPAM 2 MG PO TABS: 2.0000 mg | ORAL_TABLET | Freq: Three times a day (TID) | ORAL | 1 refills | Status: DC | PRN

## 2016-02-11 DIAGNOSIS — K219 Gastro-esophageal reflux disease without esophagitis: Secondary | ICD-10-CM | POA: Insufficient documentation

## 2016-02-12 ENCOUNTER — Other Ambulatory Visit: Payer: Self-pay | Admitting: Otolaryngology

## 2016-02-12 ENCOUNTER — Encounter (HOSPITAL_BASED_OUTPATIENT_CLINIC_OR_DEPARTMENT_OTHER): Payer: Self-pay | Admitting: *Deleted

## 2016-02-13 NOTE — H&P (Signed)
Otolaryngology Clinic Note  HPI:    Timothy Turner is a 75 y.o. male patient of Jackolyn Confer, MD for reevaluation of dysphagia.  He is taking Nexium twice daily.  He feels like the reflux burning and belching is much improved, but no change in his swallowing.  He is still choking on most everything.  A barium swallow failed immediately on account of aspiration so we do not have much information from that.    His doctors think perhaps the reflux and aspiration is contributing to some pulmonary dysfunction.  He did have recent pulmonary function testing at the [facility] in [place]. Past Medical History:  Diagnosis Date  . Acid reflux   . Hyperthyroidism     Past Surgical History:  Procedure Laterality Date  . GALLBLADDER SURGERY      No family history of bleeding disorders, wound healing problems or difficulty with anesthesia.   Social History   Social History  . Marital status: Married    Spouse name: N/A  . Number of children: N/A  . Years of education: N/A   Occupational History  . Not on file.   Social History Main Topics  . Smoking status: Former Research scientist (life sciences)  . Smokeless tobacco: Not on file  . Alcohol use Not on file  . Drug use: Not on file  . Sexual activity: Not on file   Other Topics Concern  . Not on file   Social History Narrative     Current Outpatient Prescriptions:  .  calcipotriene (DOVONOX) 0.005 % cream, Apply topically., Disp: , Rfl:  .  celecoxib (CELEBREX) 200 MG capsule, Take by mouth., Disp: , Rfl:  .  cetirizine (ZYRTEC) 10 MG tablet, Take by mouth., Disp: , Rfl:  .  cholecalciferol, vitamin D3, 1,000 unit capsule, Take by mouth., Disp: , Rfl:  .  clobetasol (TEMOVATE) 0.05 % cream, Frequency:QD   Dosage:0.0     Instructions:  Note:Dose: 0.05 %, Disp: , Rfl:  .  clonazePAM (KLONOPIN) 2 MG tablet, Take by mouth., Disp: , Rfl:  .  colesevelam (WELCHOL) 625 mg tablet, TAKE 3 TABLETS TWICE DAILY WITH MEALS (NEED TO CONTACT OFFICE TO  SCHEDULE FUTURE APPOINTMENT AND REFILLS), Disp: , Rfl:  .  colestipol (COLESTID) 1 gram tablet, Take by mouth., Disp: , Rfl:  .  escitalopram oxalate (LEXAPRO) 10 MG tablet, Take by mouth., Disp: , Rfl:  .  esomeprazole (NEXIUM PACKET) 40 mg packet, , Disp: , Rfl:  .  esomeprazole (NEXIUM) 40 mg packet, Take by mouth., Disp: , Rfl:  .  ezetimibe (ZETIA) 10 mg tablet, Take by mouth., Disp: , Rfl:  .  finasteride (PROSCAR) 5 mg tablet, Take by mouth., Disp: , Rfl:  .  fluticasone (FLONASE) 50 mcg/actuation nasal spray, 2 sprays by Nasal route., Disp: , Rfl:  .  glucosamine-chondroitin 250-200 mg Tab, Take by mouth., Disp: , Rfl:  .  levothyroxine (SYNTHROID, LEVOTHROID) 75 MCG tablet, TAKE 1 TABLET DAILY, Disp: , Rfl:  .  loratadine (CLARITIN) 10 mg tablet, Take 10 mg by mouth., Disp: , Rfl:  .  magnesium 250 mg Tab, Take by mouth., Disp: , Rfl:  .  metoPROLOL tartrate (LOPRESSOR) 25 MG tablet, Take by mouth., Disp: , Rfl:  .  multivit-min-FA-lycopen-lutein 300-600-300 mcg Tab, Take by mouth., Disp: , Rfl:  .  omega-3 fatty acids (FISH OIL) 340-1,000 mg Cap, Take by mouth., Disp: , Rfl:  .  testosterone 30 mg/actuation (1.5 mL) SlPm, Place onto the skin., Disp: , Rfl:  A complete ROS was performed with pertinent positives/negatives noted in the HPI. The remainder of the ROS are negative.    Physical Exam:    There were no vitals taken for this visit. He is trim and energetic.  His voice is clear.  He is not coughing or choking unusually.  Oral cavity and pharynx clear.  Neck unremarkable. Lungs: Clear to auscultation Heart: Regular rate and rhythm without murmurs Abdomen: Soft, active Extremities: Normal configuration Neurologic: Symmetric, grossly intact.      Impression & Plans:   Persistent dysphagia with aspiration.  This could be drying and incoordination, stricture, cricopharyngeus achalasia, or possibly even a Zenker's diverticulum.  Plan: I think we need to look under  anesthesia, dilate if necessary.  We could possibly do Botox to the cricopharyngeus.  If he has a Zenker's diverticulum we would do a diverticulotomy at the same time.  For now he will continue Nexium.  I will see him back 3 weeks postop.  We will try to retrieve his recent PFTs.  Part or all of this encounter was generated using voice transcription/voice recognition  software and imported.   Lilyan Gilford, MD  [id number]

## 2016-02-17 ENCOUNTER — Ambulatory Visit (HOSPITAL_BASED_OUTPATIENT_CLINIC_OR_DEPARTMENT_OTHER): Payer: Medicare Other | Admitting: Anesthesiology

## 2016-02-17 ENCOUNTER — Ambulatory Visit (HOSPITAL_BASED_OUTPATIENT_CLINIC_OR_DEPARTMENT_OTHER)
Admission: RE | Admit: 2016-02-17 | Discharge: 2016-02-17 | Disposition: A | Discharge status: Home / Self Care | Payer: Medicare Other | Source: Ambulatory Visit | Attending: Otolaryngology | Admitting: Otolaryngology

## 2016-02-17 ENCOUNTER — Encounter (HOSPITAL_BASED_OUTPATIENT_CLINIC_OR_DEPARTMENT_OTHER)
Admission: RE | Disposition: A | Discharge status: Home / Self Care | Payer: Self-pay | Source: Ambulatory Visit | Attending: Otolaryngology

## 2016-02-17 ENCOUNTER — Encounter (HOSPITAL_BASED_OUTPATIENT_CLINIC_OR_DEPARTMENT_OTHER): Payer: Self-pay | Admitting: *Deleted

## 2016-02-17 DIAGNOSIS — Z87891 Personal history of nicotine dependence: Secondary | ICD-10-CM | POA: Insufficient documentation

## 2016-02-17 DIAGNOSIS — F329 Major depressive disorder, single episode, unspecified: Secondary | ICD-10-CM | POA: Insufficient documentation

## 2016-02-17 DIAGNOSIS — E039 Hypothyroidism, unspecified: Secondary | ICD-10-CM | POA: Insufficient documentation

## 2016-02-17 DIAGNOSIS — R1310 Dysphagia, unspecified: Secondary | ICD-10-CM | POA: Diagnosis present

## 2016-02-17 DIAGNOSIS — Z79899 Other long term (current) drug therapy: Secondary | ICD-10-CM | POA: Insufficient documentation

## 2016-02-17 DIAGNOSIS — K219 Gastro-esophageal reflux disease without esophagitis: Secondary | ICD-10-CM | POA: Insufficient documentation

## 2016-02-17 DIAGNOSIS — I10 Essential (primary) hypertension: Secondary | ICD-10-CM | POA: Insufficient documentation

## 2016-02-17 HISTORY — PX: ESOPHAGOSCOPY W/ BOTOX INJECTION: SHX1533

## 2016-02-17 HISTORY — PX: DIRECT LARYNGOSCOPY: SHX5326

## 2016-02-17 SURGERY — LARYNGOSCOPY, DIRECT
Anesthesia: General | Site: Throat

## 2016-02-17 MED ORDER — PROPOFOL 10 MG/ML IV BOLUS
INTRAVENOUS | Status: DC | PRN
  Administered 2016-02-17: 200 mg via INTRAVENOUS

## 2016-02-17 MED ORDER — PROPOFOL 10 MG/ML IV BOLUS
INTRAVENOUS | Status: AC
  Filled 2016-02-17: qty 20

## 2016-02-17 MED ORDER — SODIUM CHLORIDE 0.9 % IJ SOLN
INTRAMUSCULAR | Status: DC | PRN
  Administered 2016-02-17: 3 mL via INTRAVENOUS

## 2016-02-17 MED ORDER — LACTATED RINGERS IV SOLN
INTRAVENOUS | Status: DC
  Administered 2016-02-17: 08:00:00 via INTRAVENOUS

## 2016-02-17 MED ORDER — MIDAZOLAM HCL 2 MG/2ML IJ SOLN
INTRAMUSCULAR | Status: AC
  Filled 2016-02-17: qty 2

## 2016-02-17 MED ORDER — DEXAMETHASONE SODIUM PHOSPHATE 10 MG/ML IJ SOLN
INTRAMUSCULAR | Status: AC
  Filled 2016-02-17: qty 1

## 2016-02-17 MED ORDER — EPINEPHRINE HCL 1 MG/ML IJ SOLN
INTRAMUSCULAR | Status: AC
  Filled 2016-02-17: qty 1

## 2016-02-17 MED ORDER — FENTANYL CITRATE (PF) 100 MCG/2ML IJ SOLN
INTRAMUSCULAR | Status: AC
  Filled 2016-02-17: qty 2

## 2016-02-17 MED ORDER — PANTOPRAZOLE SODIUM 40 MG PO TBEC
40.0000 mg | DELAYED_RELEASE_TABLET | Freq: Once | ORAL | Status: AC
  Administered 2016-02-17: 40 mg via ORAL

## 2016-02-17 MED ORDER — MIDAZOLAM HCL 2 MG/2ML IJ SOLN
1.0000 mg | INTRAMUSCULAR | Status: DC | PRN
  Administered 2016-02-17: 2 mg via INTRAVENOUS

## 2016-02-17 MED ORDER — SCOPOLAMINE 1 MG/3DAYS TD PT72: 1.0000 | MEDICATED_PATCH | Freq: Once | TRANSDERMAL | Status: DC | PRN

## 2016-02-17 MED ORDER — EPHEDRINE 5 MG/ML INJ
INTRAVENOUS | Status: AC
  Filled 2016-02-17: qty 10

## 2016-02-17 MED ORDER — FENTANYL CITRATE (PF) 100 MCG/2ML IJ SOLN
50.0000 ug | INTRAMUSCULAR | Status: DC | PRN
  Administered 2016-02-17: 100 ug via INTRAVENOUS

## 2016-02-17 MED ORDER — ROCURONIUM BROMIDE 10 MG/ML (PF) SYRINGE
PREFILLED_SYRINGE | INTRAVENOUS | Status: AC
  Filled 2016-02-17: qty 10

## 2016-02-17 MED ORDER — PANTOPRAZOLE SODIUM 40 MG IV SOLR
INTRAVENOUS | Status: AC
  Filled 2016-02-17: qty 40

## 2016-02-17 MED ORDER — ONDANSETRON HCL 4 MG/2ML IJ SOLN
INTRAMUSCULAR | Status: AC
  Filled 2016-02-17: qty 2

## 2016-02-17 MED ORDER — SODIUM CHLORIDE 0.9 % IJ SOLN
INTRAMUSCULAR | Status: AC
  Filled 2016-02-17: qty 10

## 2016-02-17 MED ORDER — LIDOCAINE 2% (20 MG/ML) 5 ML SYRINGE
INTRAMUSCULAR | Status: AC
  Filled 2016-02-17: qty 5

## 2016-02-17 MED ORDER — ONABOTULINUMTOXINA 100 UNITS IJ SOLR
INTRAMUSCULAR | Status: DC | PRN
  Administered 2016-02-17: 100 [IU] via INTRAMUSCULAR

## 2016-02-17 MED ORDER — SUGAMMADEX SODIUM 200 MG/2ML IV SOLN
INTRAVENOUS | Status: DC | PRN
  Administered 2016-02-17: 160 mg via INTRAVENOUS

## 2016-02-17 MED ORDER — LIDOCAINE HCL (CARDIAC) 20 MG/ML IV SOLN
INTRAVENOUS | Status: DC | PRN
  Administered 2016-02-17: 50 mg via INTRAVENOUS

## 2016-02-17 MED ORDER — DEXAMETHASONE SODIUM PHOSPHATE 4 MG/ML IJ SOLN
INTRAMUSCULAR | Status: DC | PRN
  Administered 2016-02-17: 10 mg via INTRAVENOUS

## 2016-02-17 MED ORDER — ONDANSETRON HCL 4 MG/2ML IJ SOLN
INTRAMUSCULAR | Status: DC | PRN
  Administered 2016-02-17: 4 mg via INTRAVENOUS

## 2016-02-17 MED ORDER — SUCCINYLCHOLINE CHLORIDE 200 MG/10ML IV SOSY
PREFILLED_SYRINGE | INTRAVENOUS | Status: AC
  Filled 2016-02-17: qty 10

## 2016-02-17 MED ORDER — GLYCOPYRROLATE 0.2 MG/ML IJ SOLN: 0.2000 mg | Freq: Once | INTRAMUSCULAR | Status: DC | PRN

## 2016-02-17 MED ORDER — ROCURONIUM BROMIDE 100 MG/10ML IV SOLN
INTRAVENOUS | Status: DC | PRN
  Administered 2016-02-17: 30 mg via INTRAVENOUS

## 2016-02-17 SURGICAL SUPPLY — 35 items
ADAPTER TUBE FLEX ULTRASET (MISCELLANEOUS) IMPLANT
CANISTER SUCT 1200ML W/VALVE (MISCELLANEOUS) ×4 IMPLANT
CONT SPEC 4OZ CLIKSEAL STRL BL (MISCELLANEOUS) IMPLANT
GAUZE PACKING FOLDED 2  STR (GAUZE/BANDAGES/DRESSINGS) ×2
GAUZE PACKING FOLDED 2 STR (GAUZE/BANDAGES/DRESSINGS) ×2 IMPLANT
GLOVE ECLIPSE 8.0 STRL XLNG CF (GLOVE) ×4 IMPLANT
GOWN STRL REUS W/ TWL LRG LVL3 (GOWN DISPOSABLE) ×2 IMPLANT
GOWN STRL REUS W/ TWL XL LVL3 (GOWN DISPOSABLE) ×2 IMPLANT
GOWN STRL REUS W/TWL LRG LVL3 (GOWN DISPOSABLE) ×2
GOWN STRL REUS W/TWL XL LVL3 (GOWN DISPOSABLE) ×2
GUARD TEETH (MISCELLANEOUS) ×4 IMPLANT
IV NS 500ML (IV SOLUTION)
IV NS 500ML BAXH (IV SOLUTION) IMPLANT
MARKER SKIN DUAL TIP RULER LAB (MISCELLANEOUS) IMPLANT
NDL SAFETY ECLIPSE 18X1.5 (NEEDLE) IMPLANT
NEEDLE HYPO 18GX1.5 BLUNT FILL (NEEDLE) IMPLANT
NEEDLE HYPO 18GX1.5 SHARP (NEEDLE)
NEEDLE SPNL 22GX7 QUINCKE BK (NEEDLE) IMPLANT
NS IRRIG 1000ML POUR BTL (IV SOLUTION) ×4 IMPLANT
PATTIES SURGICAL .5 X3 (DISPOSABLE) ×4 IMPLANT
SHEET MEDIUM DRAPE 40X70 STRL (DRAPES) ×4 IMPLANT
SLEEVE SCD COMPRESS KNEE MED (MISCELLANEOUS) ×4 IMPLANT
SOLUTION ANTI FOG 6CC (MISCELLANEOUS) ×4 IMPLANT
SOLUTION BUTLER CLEAR DIP (MISCELLANEOUS) ×4 IMPLANT
SPONGE GAUZE 4X4 12PLY STER LF (GAUZE/BANDAGES/DRESSINGS) ×8 IMPLANT
SURGILUBE 2OZ TUBE FLIPTOP (MISCELLANEOUS) ×8 IMPLANT
SYR 20CC LL (SYRINGE) IMPLANT
SYR 5ML LL (SYRINGE) IMPLANT
SYR CONTROL 10ML LL (SYRINGE) IMPLANT
SYR TB 1ML LL NO SAFETY (SYRINGE) IMPLANT
TOWEL OR 17X24 6PK STRL BLUE (TOWEL DISPOSABLE) ×4 IMPLANT
TRAP SPECIMEN MUCOUS 40CC (MISCELLANEOUS) IMPLANT
TUBE CONNECTING 20'X1/4 (TUBING) ×1
TUBE CONNECTING 20X1/4 (TUBING) ×3 IMPLANT
YANKAUER SUCT BULB TIP NO VENT (SUCTIONS) IMPLANT

## 2016-02-17 NOTE — Op Note (Signed)
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 Esophagoscopy. Botox injection cricopharyngeus   Surg:  Jodi Marble T MD  Anes:  GOT  EBL:  None  Comp:  None  Findings:  Prominent cricopharyngeus band. No lesions in the oropharynx, hypopharynx, larynx or esophagus. No Zenker's diverticulum.   Procedure: With the patient in a comfortable supine position, GOT anesthesia was induced without difficulty.  At an appropriate level, the table was turned 90 degrees away from Anesthesia.  A clean preparation and draping was performed in the standard fashion.  A surgical time out was obtained in the standard fashion.  A rubber tooth guard was placed.     The anterior commissure laryngoscope was introduced taking care to protect lips, teeth, and endotracheal tube.  Complete laryngoscopy was performed in the standard fashion.  The findings were as described above.  The laryngoscope was removed.  The cervical esophagoscope was lubricated and inserted into the hypopharynx.  With gentle pressure, it was passed through the cricopharyngeus and advanced to its full length without difficulty with the findings as described above.  It was removed.   The full length esophagoscope was also used and removed.  The anterior commissure laryngoscope was introduced once again and placed into the postcricoid area. The cricopharyngeus band was easily visible. 100 units of Botox were infiltrated into the cricopharyngeus muscle in 3 sites. Hemostasis was spontaneous.  The oropharynx, oral cavity, nasopharynx and hypopharynx were palpated with findings as described above.  The neck was palpated on both sides with the findings as described above.  At this point the procedure was completed. The tooth guard was removed.   Dental status was intact.  The patient was returned to  Anesthesia, awakened, extubated, and transferred to PACU in satisfactory condition.   Dispo:   PACU to home  Plan:  We'll wait to see how the Botox works. Recheck my office one month. Advance diet slowly beginning around 2 weeks.  Tyson Alias MD

## 2016-02-17 NOTE — Discharge Instructions (Signed)
Maintain nutrition and hydration per gastrostomy tube Ice collar as desired today We placed Botox in the upper throat sphincter muscle. This will take 2 weeks to take effect Attempt to advance diet carefully starting at about 2 weeks. Recheck my office 1 mo please [phone] for an appointment   Post Anesthesia Home Care Instructions  Activity: Get plenty of rest for the remainder of the day. A responsible adult should stay with you for 24 hours following the procedure.  For the next 24 hours, DO NOT: -Drive a car -Paediatric nurse -Drink alcoholic beverages -Take any medication unless instructed by your physician -Make any legal decisions or sign important papers.  Meals: Start with liquid foods such as gelatin or soup. Progress to regular foods as tolerated. Avoid greasy, spicy, heavy foods. If nausea and/or vomiting occur, drink only clear liquids until the nausea and/or vomiting subsides. Call your physician if vomiting continues.  Special Instructions/Symptoms: Your throat may feel dry or sore from the anesthesia or the breathing tube placed in your throat during surgery. If this causes discomfort, gargle with warm salt water. The discomfort should disappear within 24 hours.  If you had a scopolamine patch placed behind your ear for the management of post- operative nausea and/or vomiting:  1. The medication in the patch is effective for 72 hours, after which it should be removed.  Wrap patch in a tissue and discard in the trash. Wash hands thoroughly with soap and water. 2. You may remove the patch earlier than 72 hours if you experience unpleasant side effects which may include dry mouth, dizziness or visual disturbances. 3. Avoid touching the patch. Wash your hands with soap and water after contact with the patch.

## 2016-02-17 NOTE — Anesthesia Preprocedure Evaluation (Signed)
Anesthesia Evaluation  Patient identified by MRN, date of birth, ID band Patient awake    Reviewed: Allergy & Precautions, NPO status , Patient's Chart, lab work & pertinent test results  Airway Mallampati: II   Neck ROM: full    Dental   Pulmonary shortness of breath, former smoker,    breath sounds clear to auscultation       Cardiovascular hypertension, + angina + Peripheral Vascular Disease  + Valvular Problems/Murmurs MR  Rhythm:regular Rate:Normal  Mild to moderate MR.   Neuro/Psych Depression  Neuromuscular disease    GI/Hepatic GERD  ,  Endo/Other  Hypothyroidism   Renal/GU      Musculoskeletal   Abdominal   Peds  Hematology   Anesthesia Other Findings   Reproductive/Obstetrics                             Anesthesia Physical Anesthesia Plan  ASA: III  Anesthesia Plan: General   Post-op Pain Management:    Induction: Intravenous  Airway Management Planned: Oral ETT  Additional Equipment:   Intra-op Plan:   Post-operative Plan: Extubation in OR  Informed Consent: I have reviewed the patients History and Physical, chart, labs and discussed the procedure including the risks, benefits and alternatives for the proposed anesthesia with the patient or authorized representative who has indicated his/her understanding and acceptance.     Plan Discussed with: CRNA, Anesthesiologist and Surgeon  Anesthesia Plan Comments:         Anesthesia Quick Evaluation

## 2016-02-17 NOTE — Anesthesia Postprocedure Evaluation (Signed)
Anesthesia Post Note  Patient: Timothy Turner  Procedure(s) Performed: Procedure(s) (LRB): DIRECT LARYNGOSCOPY (N/A) ESOPHAGOSCOPY POSSIBLE AND BOTOX   (N/A)  Patient location during evaluation: PACU Anesthesia Type: General Level of consciousness: awake and alert and patient cooperative Pain management: pain level controlled Vital Signs Assessment: post-procedure vital signs reviewed and stable Respiratory status: spontaneous breathing and respiratory function stable Cardiovascular status: stable Anesthetic complications: no    Last Vitals:  Vitals:   02/17/16 1019 02/17/16 1020  BP:    Pulse: (!) 59 (!) 58  Resp:  20  Temp:  (P) 36.4 C    Last Pain:  Vitals:   02/17/16 0758  TempSrc: Oral                 Abb Gobert S

## 2016-02-17 NOTE — Anesthesia Procedure Notes (Signed)
Procedure Name: Intubation Date/Time: 02/17/2016 9:40 AM Performed by: Melynda Ripple D Pre-anesthesia Checklist: Patient identified, Emergency Drugs available, Suction available and Patient being monitored Patient Re-evaluated:Patient Re-evaluated prior to inductionOxygen Delivery Method: Circle system utilized Preoxygenation: Pre-oxygenation with 100% oxygen Intubation Type: IV induction Ventilation: Mask ventilation without difficulty Laryngoscope Size: Mac and 3 Grade View: Grade II Tube type: Oral Tube size: 6.0 mm Number of attempts: 1 Airway Equipment and Method: Stylet and Oral airway Placement Confirmation: ETT inserted through vocal cords under direct vision,  positive ETCO2 and breath sounds checked- equal and bilateral Secured at: 24 cm Tube secured with: Tape Dental Injury: Teeth and Oropharynx as per pre-operative assessment

## 2016-02-17 NOTE — Transfer of Care (Signed)
Immediate Anesthesia Transfer of Care Note  Patient: Timothy Turner  Procedure(s) Performed: Procedure(s): DIRECT LARYNGOSCOPY (N/A) ESOPHAGOSCOPY POSSIBLE AND BOTOX   (N/A)  Patient Location: PACU  Anesthesia Type:General  Level of Consciousness: awake, alert  and oriented  Airway & Oxygen Therapy: Patient Spontanous Breathing and Patient connected to face mask oxygen  Post-op Assessment: Report given to RN and Post -op Vital signs reviewed and stable  Post vital signs: Reviewed and stable  Last Vitals:  Vitals:   02/17/16 1019 02/17/16 1020  BP:    Pulse: (!) 59 (!) 58  Resp:  20  Temp:  (P) 36.4 C    Last Pain:  Vitals:   02/17/16 0758  TempSrc: Oral         Complications: No apparent anesthesia complications

## 2016-02-17 NOTE — Interval H&P Note (Signed)
History and Physical Interval Note:  02/17/2016 7:55 AM  Timothy Turner  has presented today for surgery, with the diagnosis of DYSPHAGIA  The various methods of treatment have been discussed with the patient and family. After consideration of risks, benefits and other options for treatment, the patient has consented to  Procedure(s): DIRECT LARYNGOSCOPY (N/A) ESOPHAGOSCOPY POSSIBLE DILATION.POSSIBLE BOTOX  CRICOPHARYNGEUS (N/A) POSSIBLE ZENKER'S DIVERTICULECTOMY (N/A) as a surgical intervention .  The patient's history has been re-reviewed, patient re-examined, no change in status, stable for surgery.  I have re-reviewed the patient's chart and labs.  Questions were answered to the patient's satisfaction.     Jodi Marble

## 2016-02-18 ENCOUNTER — Encounter (HOSPITAL_BASED_OUTPATIENT_CLINIC_OR_DEPARTMENT_OTHER): Payer: Self-pay | Admitting: Otolaryngology

## 2016-02-25 ENCOUNTER — Encounter: Payer: Self-pay | Admitting: Gastroenterology

## 2016-02-25 ENCOUNTER — Ambulatory Visit (INDEPENDENT_AMBULATORY_CARE_PROVIDER_SITE_OTHER): Payer: Medicare Other | Admitting: Gastroenterology

## 2016-02-25 VITALS — BP 104/76 | HR 74 | Ht 72.0 in | Wt 171.4 lb

## 2016-02-25 DIAGNOSIS — R1312 Dysphagia, oropharyngeal phase: Secondary | ICD-10-CM | POA: Diagnosis not present

## 2016-02-25 DIAGNOSIS — R1033 Periumbilical pain: Secondary | ICD-10-CM | POA: Diagnosis not present

## 2016-02-25 NOTE — Progress Notes (Signed)
[facility] Progress Note  Chief Complaint: Periumbilical abdominal pain  Subjective  History:  Timothy Turner sees me to evaluate periumbilical abdominal pain. It started sometime late last year, and he had a CT scan for it in December 2016 which was unremarkable. He feels in various locations around the umbilicus, and sometimes in the right lower quadrant as well. It only hurts if he presses deeply on the area or if he puts pressure on it such as bending over. He has noticed no bulging no change in bowel habits or rectal bleeding. Regarding his chronic dysphagia and weight loss, he underwent cricopharyngeal Botox injection by ENT on 02/17/2016. He has not yet seen an improvement in swallowing, they told him that in another week he should start trying solid food. He therefore continues to lose weight and previously declined PEG placement.  ROS: Cardiovascular:  no chest pain Respiratory: He has dyspnea with exertion  The patient's Past Medical, Family and Social History were reviewed and are on file in the EMR.  Objective:  Med list reviewed  Vital signs in last 24 hrs: Vitals:   02/25/16 0935  BP: 104/76  Pulse: 74    Physical Exam Poor muscle mass as before  HEENT: sclera anicteric, oral mucosa moist without lesions  Neck: supple, no thyromegaly, JVD or lymphadenopathy  Cardiac: RRR without murmurs, S1S2 heard, no peripheral edema  Pulm: clear to auscultation bilaterally, normal RR and effort noted  Abdomen: soft, mild abdominal wall tenderness around the umbilicus, no hernia or other visible or palpable abnormality, active bowel sounds of normal character. No guarding or palpable hepatosplenomegaly.  Skin; warm and dry, no jaundice or rash  Radiologic studies:  CTAP 12/16 for this was normal  @ASSESSMENTPLANBEGIN @ Assessment: Encounter Diagnoses  Name Primary?  [name] Dysphagia, oropharyngeal phase Yes  . Periumbilical abdominal pain    This appears to be abdominal wall  muscular pain. He has no associated GI symptoms. CT scan was unremarkable, I do not think an endoscopic workup will be helpful. It really seems it was causing him more worry then physical distress. He is reassured now.  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 Perhaps some more time after the Botox injection will yield improvement.  Plan:  No further testing planned at this time. Apply heat to the area as needed. He will see me as needed.  Total time 30 minutes, over half spent in counseling and coordination of care.   Nelida Meuse III

## 2016-02-25 NOTE — Patient Instructions (Signed)
If you are age 73 or older, your body mass index should be between 23-30. Your Body mass index is 23.24 kg/m. If this is out of the aforementioned range listed, please consider follow up with your Primary Care Provider.  If you are age 50 or younger, your body mass index should be between 19-25. Your Body mass index is 23.24 kg/m. If this is out of the aformentioned range listed, please consider follow up with your Primary Care Provider.   Thank you for choosing [facility]  Dr Wilfrid Lund III

## 2016-03-24 ENCOUNTER — Other Ambulatory Visit: Payer: Self-pay | Admitting: Otolaryngology

## 2016-03-24 DIAGNOSIS — R1314 Dysphagia, pharyngoesophageal phase: Secondary | ICD-10-CM

## 2016-03-24 DIAGNOSIS — K219 Gastro-esophageal reflux disease without esophagitis: Secondary | ICD-10-CM

## 2016-03-25 ENCOUNTER — Ambulatory Visit (INDEPENDENT_AMBULATORY_CARE_PROVIDER_SITE_OTHER): Payer: Medicare Other | Admitting: Family Medicine

## 2016-03-25 ENCOUNTER — Encounter: Payer: Self-pay | Admitting: Family Medicine

## 2016-03-25 VITALS — BP 118/70 | HR 72 | Temp 98.2°F

## 2016-03-25 DIAGNOSIS — J449 Chronic obstructive pulmonary disease, unspecified: Secondary | ICD-10-CM

## 2016-03-25 DIAGNOSIS — R1310 Dysphagia, unspecified: Secondary | ICD-10-CM

## 2016-03-25 DIAGNOSIS — R1312 Dysphagia, oropharyngeal phase: Secondary | ICD-10-CM

## 2016-03-25 DIAGNOSIS — R0600 Dyspnea, unspecified: Secondary | ICD-10-CM

## 2016-03-25 DIAGNOSIS — E039 Hypothyroidism, unspecified: Secondary | ICD-10-CM | POA: Diagnosis not present

## 2016-03-25 DIAGNOSIS — Z23 Encounter for immunization: Secondary | ICD-10-CM | POA: Diagnosis not present

## 2016-03-25 LAB — COMPREHENSIVE METABOLIC PANEL
ALK PHOS: 80 U/L (ref 39–117)
ALT: 20 U/L (ref 0–53)
AST: 22 U/L (ref 0–37)
Albumin: 4 g/dL (ref 3.5–5.2)
BUN: 13 mg/dL (ref 6–23)
CHLORIDE: 106 meq/L (ref 96–112)
CO2: 30 meq/L (ref 19–32)
Calcium: 9.5 mg/dL (ref 8.4–10.5)
Creatinine, Ser: 1.17 mg/dL (ref 0.40–1.50)
GFR: 64.97 mL/min (ref >60.00)
GLUCOSE: 103 mg/dL — AB (ref 70–99)
POTASSIUM: 4.2 meq/L (ref 3.5–5.1)
Sodium: 144 mEq/L (ref 135–145)
TOTAL PROTEIN: 6.9 g/dL (ref 6.0–8.3)
Total Bilirubin: 0.6 mg/dL (ref 0.2–1.2)

## 2016-03-25 LAB — TSH: TSH: 3.61 u[IU]/mL (ref 0.35–4.50)

## 2016-03-25 MED ORDER — TIOTROPIUM BROMIDE MONOHYDRATE 18 MCG IN CAPS: 18.0000 ug | ORAL_CAPSULE | Freq: Every day | RESPIRATORY_TRACT | 1 refills | Status: DC

## 2016-03-25 MED ORDER — ALBUTEROL SULFATE HFA 108 (90 BASE) MCG/ACT IN AERS: 2.0000 | INHALATION_SPRAY | Freq: Four times a day (QID) | RESPIRATORY_TRACT | 4 refills | Status: DC | PRN

## 2016-03-25 NOTE — Assessment & Plan Note (Signed)
We'll check a TSH today. Continue Synthroid.

## 2016-03-25 NOTE — Patient Instructions (Signed)
Nice to see you. Please keep the appointment for a barium swallow. We'll check some lab work and call you with the results. I sent in an albuterol inhaler for to try for your breathing. If this is not beneficial please let us know.

## 2016-03-25 NOTE — Assessment & Plan Note (Signed)
Patient with what appears to be mild COPD based on PFTs. Did not respond to anoro. Appears to have not responded to albuterol in the past. Discussed having albuterol in hand in case he has any acute changes in his breathing. We will trial Spiriva as it appears that he is a gold class a patient. He'll continue to follow with pulmonology as well.

## 2016-03-25 NOTE — Progress Notes (Signed)
Timothy Rumps, MD Phone: [phone]  Timothy Turner is a 75 y.o. male who presents today for follow-up.  Dysphagia: Patient notes this is actually worse than previously. He has been seen by ENT who did Botox injections though patient had no improvement with this. They're planning on repeating a barium swallow and starting the workup over. Notes he saw GI as well and they've stated nothing to do at this time. Notes he can't eat anything solid. He's blending all his foods. At times recently he bends over 30-45 minutes after eating and liquids will come out. He is on Nexium.  COPD: Has been evaluated by 2 pulmonologists and they both stated he had moderate COPD. He notes dyspnea with exertion. Notes this is unchanged from previously. He was placed on anoro by pulmonology and notes he used this for a month and had no benefit. He additionally notes he has tried albuterol in the past with little benefit. Has had cardiac workup for his shortness of breath and is unremarkable.  HYPOTHYROIDISM Disease Monitoring Weight changes: Some decrease  Skin Changes: No Heat/Cold intolerance: Some cold intolerance  Medication Monitoring Compliance:  Taking Synthroid   Last TSH:   Lab Results  Component Value Date   TSH 2.59 10/17/2015    PMH: Former smoker   ROS see history of present illness  Objective  Physical Exam Vitals:   03/25/16 0818  BP: 118/70  Pulse: 72  Temp: 98.2 F (36.8 C)    BP Readings from Last 3 Encounters:  03/25/16 118/70  02/25/16 104/76  02/17/16 136/70   Wt Readings from Last 3 Encounters:  02/25/16 171 lb 6 oz (77.7 kg)  02/17/16 174 lb (78.9 kg)  01/23/16 174 lb 9.6 oz (79.2 kg)    Physical Exam  Constitutional: He is well-developed, well-nourished, and in no distress.  HENT:  Head: Normocephalic and atraumatic.  Cardiovascular: Normal rate, regular rhythm and normal heart sounds.   Pulmonary/Chest: Effort normal and breath sounds normal.    Abdominal: Soft. Bowel sounds are normal. He exhibits no distension. There is no tenderness. There is no rebound and no guarding.  Musculoskeletal: He exhibits no edema.  Neurological: He is alert. Gait normal.  Skin: Skin is warm and dry.     Assessment/Plan: Please see individual problem list.  Dysphagia, oropharyngeal phase Continues to have issues with this. I'm unsure of the cause of this time. He will continue to follow with ENT and proceed with the barium swallow this week.  Hypothyroidism We'll check a TSH today. Continue Synthroid.  COPD (chronic obstructive pulmonary disease) ([place]) Patient with what appears to be mild COPD based on PFTs. Did not respond to anoro. Appears to have not responded to albuterol in the past. Discussed having albuterol in hand in case he has any acute changes in his breathing. We will trial Spiriva as it appears that he is a gold class a patient. He'll continue to follow with pulmonology as well.   Orders Placed This Encounter  Procedures  . Flu vaccine HIGH DOSE PF  . TSH  . Comp Met (CMET)    Meds ordered this encounter  Medications  . albuterol (PROVENTIL HFA;VENTOLIN HFA) 108 (90 Base) MCG/ACT inhaler    Sig: Inhale 2 puffs into the lungs every 6 (six) hours as needed for wheezing or shortness of breath.    Dispense:  3 Inhaler    Refill:  4  . tiotropium (SPIRIVA HANDIHALER) 18 MCG inhalation capsule    Sig: Place 1  capsule (18 mcg total) into inhaler and inhale daily.    Dispense:  30 capsule    Refill:  [place], MD [place]

## 2016-03-25 NOTE — Assessment & Plan Note (Signed)
Continues to have issues with this. I'm unsure of the cause of this time. He will continue to follow with ENT and proceed with the barium swallow this week.

## 2016-03-27 ENCOUNTER — Ambulatory Visit
Admission: RE | Admit: 2016-03-27 | Discharge: 2016-03-27 | Disposition: A | Discharge status: Home / Self Care | Payer: Medicare Other | Source: Ambulatory Visit | Attending: Otolaryngology | Admitting: Otolaryngology

## 2016-03-27 DIAGNOSIS — K219 Gastro-esophageal reflux disease without esophagitis: Secondary | ICD-10-CM | POA: Diagnosis present

## 2016-03-27 DIAGNOSIS — R1314 Dysphagia, pharyngoesophageal phase: Secondary | ICD-10-CM | POA: Insufficient documentation

## 2016-03-31 ENCOUNTER — Encounter (HOSPITAL_BASED_OUTPATIENT_CLINIC_OR_DEPARTMENT_OTHER): Payer: Self-pay | Admitting: Otolaryngology

## 2016-04-27 ENCOUNTER — Telehealth: Payer: Self-pay | Admitting: *Deleted

## 2016-04-27 DIAGNOSIS — M791 Myalgia, unspecified site: Secondary | ICD-10-CM

## 2016-04-27 MED ORDER — CLONAZEPAM 2 MG PO TABS: 2.0000 mg | ORAL_TABLET | Freq: Three times a day (TID) | ORAL | 1 refills | Status: DC | PRN

## 2016-04-27 NOTE — Telephone Encounter (Signed)
Refill given. Please faxed to pharmacy.

## 2016-04-27 NOTE — Telephone Encounter (Signed)
Pt has requested a medication refill for clonazepam-30 day supply  Pharmacy [facility]

## 2016-04-27 NOTE — Telephone Encounter (Signed)
RX faxed

## 2016-04-27 NOTE — Telephone Encounter (Signed)
Please advise on refill.

## 2016-05-29 ENCOUNTER — Encounter: Payer: Self-pay | Admitting: Gastroenterology

## 2016-05-29 ENCOUNTER — Ambulatory Visit (INDEPENDENT_AMBULATORY_CARE_PROVIDER_SITE_OTHER): Payer: Medicare Other | Admitting: Gastroenterology

## 2016-05-29 VITALS — BP 110/60 | HR 64 | Ht 72.0 in | Wt 171.5 lb

## 2016-05-29 DIAGNOSIS — K219 Gastro-esophageal reflux disease without esophagitis: Secondary | ICD-10-CM

## 2016-05-29 DIAGNOSIS — R1312 Dysphagia, oropharyngeal phase: Secondary | ICD-10-CM | POA: Diagnosis not present

## 2016-05-29 NOTE — Progress Notes (Signed)
[facility] Progress Note  Chief Complaint: Dysphagia and aspiration  Subjective  History:  Timothy Turner was back for a reevaluation after his UES Botox therapy with Timothy Turner from [facility]. It gave transient improvement in dysphagia, but then made his regurgitation much worse. He previously had episodes of stomach contents regurgitating into the chest, and after the Botox treatment they then regurgitated even higher, especially at night. He then had a barium study done in October which showed passage of almost no barium and in the immediate aspiration. He says that is because he was required to stand up straight with headache to the back instead of chin tuck as he normally swallows. He and his wife indicated that is the point when Timothy Turner wanted him reevaluated by GI.  ROS: Cardiovascular:  no chest pain Respiratory: no dyspnea  The patient's Past Medical, Family and Social History were reviewed and are on file in the EMR.  Objective:  Med list reviewed  Vital signs in last 24 hrs: Vitals:   05/29/16 0920  BP: 110/60  Pulse: 64    Physical Exam  Wt stable at 171 pounds, he remains thin with fair muscle mass Vocal quality normal  Remainder of exam not performed today. Total 20 minute visit, all of which was spent reviewing records, counseling and coordinating care.   Radiologic studies:  See October 2017 barium study in Epic system  @ASSESSMENTPLANBEGIN @ Assessment: Encounter Diagnoses  Name Primary?  [name] Dysphagia, oropharyngeal phase Yes  . 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 It is not clear if it is only related to the UES, or a phenomenon that involves the entire pharyngeal muscles as well. It has had a thorough workup in [place], then he was seen by the chief of GI at [facility], and then by me. My upper endoscopy revealed nothing mechanical in  nothing amenable to dilation. Studies clearly indicate that the problem is centered in the oropharyngeal region. He does have GERD as well, and like most patients probably has some degree of lax LES tone. Nevertheless, that is a secondary issue to his more serious problem. He asked me for what must be the third or fourth time whether he should have a fundoplication procedure. I have again told him that I strongly advise against that given his more proximal motility disorder. As before, he is still angry and fixated on his belief that the physician in [place] "did something during that endoscopy to cause all of this".  I'm afraid that Timothy Turner's problem remains one that I simply cannot fix. He has had speech and language pathology and has learned safer chin tuck maneuvers, but he will always remain at risk for aspiration. He must always have his head of bed elevated about 45 because he is at serious risk of nocturnal reflux and regurgitation. It seems unlikely that this problem is amenable to further Botox or surgical therapy. That would also carry the very serious risk of worsening nocturnal regurgitation and aspiration. I again left open the offer for gastrostomy placement, and he again replied that he would never want a feeding tube. I have therefore made no plans for him to follow up with me.  Total time 20 minutes, all spent in counseling and coordination of care.   Timothy Turner  CC: Timothy Turner, [facility]

## 2016-05-29 NOTE — Patient Instructions (Signed)
If you are age 73 or older, your body mass index should be between 23-30. Your Body mass index is 23.26 kg/m. If this is out of the aforementioned range listed, please consider follow up with your Primary Care Provider.  If you are age 33 or younger, your body mass index should be between 19-25. Your Body mass index is 23.26 kg/m. If this is out of the aformentioned range listed, please consider follow up with your Primary Care Provider.   Thank you for choosing [facility]  Dr Wilfrid Lund III

## 2016-06-29 ENCOUNTER — Ambulatory Visit (INDEPENDENT_AMBULATORY_CARE_PROVIDER_SITE_OTHER): Payer: Medicare Other | Admitting: Family Medicine

## 2016-06-29 ENCOUNTER — Encounter: Payer: Self-pay | Admitting: Family Medicine

## 2016-06-29 DIAGNOSIS — J449 Chronic obstructive pulmonary disease, unspecified: Secondary | ICD-10-CM | POA: Diagnosis not present

## 2016-06-29 DIAGNOSIS — F4321 Adjustment disorder with depressed mood: Secondary | ICD-10-CM

## 2016-06-29 DIAGNOSIS — R1312 Dysphagia, oropharyngeal phase: Secondary | ICD-10-CM | POA: Diagnosis not present

## 2016-06-29 DIAGNOSIS — M791 Myalgia, unspecified site: Secondary | ICD-10-CM

## 2016-06-29 MED ORDER — FLUTICASONE FUROATE-VILANTEROL 100-25 MCG/INH IN AEPB: 1.0000 | INHALATION_SPRAY | Freq: Every day | RESPIRATORY_TRACT | 1 refills | Status: DC

## 2016-06-29 MED ORDER — CLONAZEPAM 2 MG PO TABS
2.0000 mg | ORAL_TABLET | Freq: Three times a day (TID) | ORAL | 1 refills | Status: DC | PRN
Start: 2016-06-29 — End: 2016-08-26

## 2016-06-29 NOTE — Assessment & Plan Note (Signed)
Relatively stable. No SI or HI. Continue Lexapro.

## 2016-06-29 NOTE — Assessment & Plan Note (Signed)
Continues to have issues with this. Appears to be idiopathic at this time. He has undergone extensive evaluation with multiple specialists with no identified cause. He should continue what he has learned through speech therapy and continue to eat thick liquids. We will continue to monitor.

## 2016-06-29 NOTE — Progress Notes (Signed)
  [name], MD Phone: [phone]  Timothy Turner is a 76 y.o. male who presents today for follow-up.  Dysphagia: Patient notes he followed up with GI and had a repeat barium swallow through ENT though he aspirated into his lungs. Notes he was told the GI physician was going to talk to ENT. He has to blend everything up to be able to swallow solids. He is able to get 3 meals in a day this way. He's been evaluated by multiple GI physicians, ENT, and neurology. It does not seem that anybody has any significant idea of what is causing this issue.  COPD: Patient notes this is unchanged. He gets shortness of breath if he bends over. Also can occur if he is walking up a big hill. He has no chest pain with this. He had negative cardiac catheter at the start of this workup. He does note some cough. Notes's Spiriva and albuterol have not been beneficial. Was also on anoro with little benefit.  Depression/anxiety: Taking Klonopin for chronic muscle pain. Also on Lexapro. Does note some depression and anxiety with regards to his chronic issues remaining unexplained. He notes no SI or HI.  PMH: Former smoker   ROS see history of present illness  Objective  Physical Exam Vitals:   06/29/16 0839  BP: 112/68  Pulse: 63  Temp: 97.8 F (36.6 C)    BP Readings from Last 3 Encounters:  06/29/16 112/68  05/29/16 110/60  03/25/16 118/70   Wt Readings from Last 3 Encounters:  06/29/16 178 lb 9.6 oz (81 kg)  05/29/16 171 lb 8 oz (77.8 kg)  02/25/16 171 lb 6 oz (77.7 kg)    Physical Exam  Constitutional: No distress.  HENT:  Head: Normocephalic and atraumatic.  Cardiovascular: Normal rate, regular rhythm and normal heart sounds.   Pulmonary/Chest: Effort normal and breath sounds normal.  Musculoskeletal: He exhibits no edema.  Neurological: He is alert. Gait normal.  Skin: He is not diaphoretic.     Assessment/Plan: Please see individual problem list.  Dysphagia,  oropharyngeal phase Continues to have issues with this. Appears to be idiopathic at this time. He has undergone extensive evaluation with multiple specialists with no identified cause. He should continue what he has learned through speech therapy and continue to eat thick liquids. We will continue to monitor.  COPD (chronic obstructive pulmonary disease) ([facility]) Continues to have issues with his breathing particularly when bending forward. Negative cardiac workup previously. PFTs did reveal moderate obstruction. Has not responded to multiple inhalers. We will attempt an inhaled corticosteroid and LABA to see if this will be beneficial. Memory Dance will be prescribed. Discussed rinsing his mouth and throat out after using this.  Adjustment disorder with depressed mood Relatively stable. No SI or HI. Continue Lexapro.   No orders of the defined types were placed in this encounter.   Meds ordered this encounter  Medications  . fluticasone furoate-vilanterol (BREO ELLIPTA) 100-25 MCG/INH AEPB    Sig: Inhale 1 puff into the lungs daily.    Dispense:  60 each    Refill:  1  . clonazePAM (KLONOPIN) 2 MG tablet    Sig: Take 1 tablet (2 mg total) by mouth 3 (three) times daily as needed (severe muscle spasm).    Dispense:  90 tablet    Refill:  1    [name], MD [place]

## 2016-06-29 NOTE — Assessment & Plan Note (Signed)
Continues to have issues with his breathing particularly when bending forward. Negative cardiac workup previously. PFTs did reveal moderate obstruction. Has not responded to multiple inhalers. We will attempt an inhaled corticosteroid and LABA to see if this will be beneficial. Memory Dance will be prescribed. Discussed rinsing his mouth and throat out after using this.

## 2016-06-29 NOTE — Progress Notes (Signed)
Pre visit review using our clinic review tool, if applicable. No additional management support is needed unless otherwise documented below in the visit note. 

## 2016-06-29 NOTE — Patient Instructions (Signed)
Nice to see you. We are going to trial you on Breo to see if this will be beneficial for your COPD. Please monitor your swallowing and this worsens please let us know. Please monitor your depression and anxiety and that this worsens please let us know. If you develop thoughts of harming herself or others please seek medical attention medially.

## 2016-07-03 ENCOUNTER — Telehealth: Payer: Self-pay | Admitting: Family Medicine

## 2016-07-03 MED ORDER — EZETIMIBE 10 MG PO TABS: 10.0000 mg | ORAL_TABLET | Freq: Every day | ORAL | 1 refills | Status: DC

## 2016-07-03 NOTE — Telephone Encounter (Signed)
Last lipid panel 11/12/14 last filled 10/17/15 90 1rf

## 2016-07-03 NOTE — Telephone Encounter (Signed)
Pt needs a refill for a year on ezetimibe (ZETIA) 10 MG tablet sent to [place].[name] Please advise

## 2016-07-03 NOTE — Telephone Encounter (Signed)
Sent to pharmacy 

## 2016-07-10 ENCOUNTER — Telehealth: Payer: Self-pay | Admitting: Family Medicine

## 2016-07-10 NOTE — Telephone Encounter (Signed)
Please advise 

## 2016-07-10 NOTE — Telephone Encounter (Signed)
Patient has had severe muscle aches with Crestor and other statins. Please let his pharmacy know and see if there is a way to keep him on Zetia. Thanks.

## 2016-07-10 NOTE — Telephone Encounter (Signed)
[place] called from [employer]. She stating that the patient's Zetia is on a non preferred list and they are asking if the patient could be switched to simvastatin, pravastatin or rosuvastatin.  Please call back at [phone] ref# [id number]. Hours are from 9-5:30 EST.

## 2016-07-10 NOTE — Telephone Encounter (Signed)
Pharmacy notified.

## 2016-07-10 NOTE — Telephone Encounter (Signed)
Pt does not want to do the AWV at this time he said he will call back. Thank you!

## 2016-07-17 NOTE — Telephone Encounter (Signed)
Pt has stated that a prior authorization will be needed for this medication.  Pharmacy [facility]  Contact [phone]

## 2016-07-17 NOTE — Telephone Encounter (Signed)
Started PA over phone with [facility] was approved for one year patient aware. Will receive approval letter for PCP and patient will receive approval.

## 2016-07-17 NOTE — Telephone Encounter (Signed)
Please advise 

## 2016-07-23 ENCOUNTER — Encounter: Payer: Self-pay | Admitting: *Deleted

## 2016-07-23 NOTE — Discharge Instructions (Signed)
Cataract Surgery, Care After °Refer to this sheet in the next few weeks. These instructions provide you with information about caring for yourself after your procedure. Your health care provider may also give you more specific instructions. Your treatment has been planned according to current medical practices, but problems sometimes occur. Call your health care provider if you have any problems or questions after your procedure. °What can I expect after the procedure? °After the procedure, it is common to have: °· Itching. °· Discomfort. °· Fluid discharge. °· Sensitivity to light and to touch. °· Bruising. °Follow these instructions at home: °Eye Care  °· Check your eye every day for signs of infection. Watch for: °¨ Redness, swelling, or pain. °¨ Fluid, blood, or pus. °¨ Warmth. °¨ Bad smell. °Activity  °· Avoid strenuous activities, such as playing contact sports, for as long as told by your health care provider. °· Do not drive or operate heavy machinery until your health care provider approves. °· Do not bend or lift heavy objects . Bending increases pressure in the eye. You can walk, climb stairs, and do light household chores. °· Ask your health care provider when you can return to work. If you work in a dusty environment, you may be advised to wear protective eyewear for a period of time. °General instructions  °· Take or apply over-the-counter and prescription medicines only as told by your health care provider. This includes eye drops. °· Do not touch or rub your eyes. °· If you were given a protective shield, wear it as told by your health care provider. If you were not given a protective shield, wear sunglasses as told by your health care provider to protect your eyes. °· Keep the area around your eye clean and dry. Avoid swimming or allowing water to hit you directly in the face while showering until told by your health care provider. Keep soap and shampoo out of your eyes. °· Do not put a contact lens  into the affected eye or eyes until your health care provider approves. °· Keep all follow-up visits as told by your health care provider. This is important. °Contact a health care provider if: ° °· You have increased bruising around your eye. °· You have pain that is not helped with medicine. °· You have a fever. °· You have redness, swelling, or pain in your eye. °· You have fluid, blood, or pus coming from your incision. °· Your vision gets worse. °Get help right away if: °· You have sudden vision loss. °This information is not intended to replace advice given to you by your health care provider. Make sure you discuss any questions you have with your health care provider. °Document Released: 12/26/2004 Document Revised: 10/17/2015 Document Reviewed: 04/18/2015 °Elsevier Interactive Patient Education © 2017 [employer] ° ° ° ° °General Anesthesia, Adult, Care After °These instructions provide you with information about caring for yourself after your procedure. Your health care provider may also give you more specific instructions. Your treatment has been planned according to current medical practices, but problems sometimes occur. Call your health care provider if you have any problems or questions after your procedure. °What can I expect after the procedure? °After the procedure, it is common to have: °· Vomiting. °· A sore throat. °· Mental slowness. °It is common to feel: °· Nauseous. °· Cold or shivery. °· Sleepy. °· Tired. °· Sore or achy, even in parts of your body where you did not have surgery. °Follow these instructions at   home: °For at least 24 hours after the procedure:  °· Do not: °¨ Participate in activities where you could fall or become injured. °¨ Drive. °¨ Use heavy machinery. °¨ Drink alcohol. °¨ Take sleeping pills or medicines that cause drowsiness. °¨ Make important decisions or sign legal documents. °¨ Take care of children on your own. °· Rest. °Eating and drinking  °· If you vomit, drink  water, juice, or soup when you can drink without vomiting. °· Drink enough fluid to keep your urine clear or pale yellow. °· Make sure you have little or no nausea before eating solid foods. °· Follow the diet recommended by your health care provider. °General instructions  °· Have a responsible adult stay with you until you are awake and alert. °· Return to your normal activities as told by your health care provider. Ask your health care provider what activities are safe for you. °· Take over-the-counter and prescription medicines only as told by your health care provider. °· If you smoke, do not smoke without supervision. °· Keep all follow-up visits as told by your health care provider. This is important. °Contact a health care provider if: °· You continue to have nausea or vomiting at home, and medicines are not helpful. °· You cannot drink fluids or start eating again. °· You cannot urinate after 8-12 hours. °· You develop a skin rash. °· You have fever. °· You have increasing redness at the site of your procedure. °Get help right away if: °· You have difficulty breathing. °· You have chest pain. °· You have unexpected bleeding. °· You feel that you are having a life-threatening or urgent problem. °This information is not intended to replace advice given to you by your health care provider. Make sure you discuss any questions you have with your health care provider. °Document Released: 09/14/2000 Document Revised: 11/11/2015 Document Reviewed: 05/23/2015 °Elsevier Interactive Patient Education © 2017 [employer] ° °

## 2016-07-29 ENCOUNTER — Ambulatory Visit: Payer: Medicare Other | Admitting: Anesthesiology

## 2016-07-29 ENCOUNTER — Ambulatory Visit
Admission: RE | Admit: 2016-07-29 | Discharge: 2016-07-29 | Disposition: A | Discharge status: Home / Self Care | Payer: Medicare Other | Source: Ambulatory Visit | Attending: Ophthalmology | Admitting: Ophthalmology

## 2016-07-29 ENCOUNTER — Other Ambulatory Visit: Payer: Self-pay

## 2016-07-29 ENCOUNTER — Encounter
Admission: RE | Disposition: A | Discharge status: Home / Self Care | Payer: Self-pay | Source: Ambulatory Visit | Attending: Ophthalmology

## 2016-07-29 DIAGNOSIS — E039 Hypothyroidism, unspecified: Secondary | ICD-10-CM | POA: Diagnosis not present

## 2016-07-29 DIAGNOSIS — J449 Chronic obstructive pulmonary disease, unspecified: Secondary | ICD-10-CM | POA: Insufficient documentation

## 2016-07-29 DIAGNOSIS — Z87891 Personal history of nicotine dependence: Secondary | ICD-10-CM | POA: Insufficient documentation

## 2016-07-29 DIAGNOSIS — H2512 Age-related nuclear cataract, left eye: Secondary | ICD-10-CM | POA: Insufficient documentation

## 2016-07-29 DIAGNOSIS — K219 Gastro-esophageal reflux disease without esophagitis: Secondary | ICD-10-CM | POA: Diagnosis not present

## 2016-07-29 DIAGNOSIS — I10 Essential (primary) hypertension: Secondary | ICD-10-CM | POA: Insufficient documentation

## 2016-07-29 DIAGNOSIS — I739 Peripheral vascular disease, unspecified: Secondary | ICD-10-CM | POA: Insufficient documentation

## 2016-07-29 HISTORY — PX: CATARACT EXTRACTION W/PHACO: SHX586

## 2016-07-29 HISTORY — DX: Other specified personal risk factors, not elsewhere classified: Z91.89

## 2016-07-29 HISTORY — DX: Peripheral vascular disease, unspecified: I73.9

## 2016-07-29 SURGERY — PHACOEMULSIFICATION, CATARACT, WITH IOL INSERTION
Anesthesia: Monitor Anesthesia Care | Laterality: Left | Wound class: Clean

## 2016-07-29 MED ORDER — ACETAMINOPHEN 325 MG PO TABS: 325.0000 mg | ORAL_TABLET | ORAL | Status: DC | PRN

## 2016-07-29 MED ORDER — MIDAZOLAM HCL 2 MG/2ML IJ SOLN
INTRAMUSCULAR | Status: DC | PRN
  Administered 2016-07-29: 2 mg via INTRAVENOUS

## 2016-07-29 MED ORDER — MOXIFLOXACIN HCL 0.5 % OP SOLN
1.0000 [drp] | OPHTHALMIC | Status: DC | PRN
  Administered 2016-07-29 (×3): 1 [drp] via OPHTHALMIC

## 2016-07-29 MED ORDER — NA HYALUR & NA CHOND-NA HYALUR 0.4-0.35 ML IO KIT
PACK | INTRAOCULAR | Status: DC | PRN
  Administered 2016-07-29: 1 mL via INTRAOCULAR

## 2016-07-29 MED ORDER — CEFUROXIME OPHTHALMIC INJECTION 1 MG/0.1 ML
INJECTION | OPHTHALMIC | Status: DC | PRN
  Administered 2016-07-29: 0.1 mL via OPHTHALMIC

## 2016-07-29 MED ORDER — LIDOCAINE HCL (PF) 2 % IJ SOLN
INTRAMUSCULAR | Status: DC | PRN
  Administered 2016-07-29: 1 mL

## 2016-07-29 MED ORDER — ACETAMINOPHEN 160 MG/5ML PO SOLN: 325.0000 mg | ORAL | Status: DC | PRN

## 2016-07-29 MED ORDER — ARMC OPHTHALMIC DILATING DROPS
1.0000 "application " | OPHTHALMIC | Status: DC | PRN
  Administered 2016-07-29 (×3): 1 via OPHTHALMIC

## 2016-07-29 MED ORDER — LACTATED RINGERS IV SOLN: INTRAVENOUS | Status: DC

## 2016-07-29 MED ORDER — LEVOTHYROXINE SODIUM 75 MCG PO TABS: 75.0000 ug | ORAL_TABLET | Freq: Every day | ORAL | 1 refills | Status: DC

## 2016-07-29 MED ORDER — FENTANYL CITRATE (PF) 100 MCG/2ML IJ SOLN
INTRAMUSCULAR | Status: DC | PRN
  Administered 2016-07-29: 50 ug via INTRAVENOUS

## 2016-07-29 MED ORDER — BRIMONIDINE TARTRATE-TIMOLOL 0.2-0.5 % OP SOLN
OPHTHALMIC | Status: DC | PRN
  Administered 2016-07-29: 1 [drp] via OPHTHALMIC

## 2016-07-29 MED ORDER — EPINEPHRINE PF 1 MG/ML IJ SOLN
INTRAMUSCULAR | Status: DC | PRN
  Administered 2016-07-29: 64 mL via OPHTHALMIC

## 2016-07-29 SURGICAL SUPPLY — 27 items
CANNULA ANT/CHMB 27GA (MISCELLANEOUS) ×3 IMPLANT
CARTRIDGE ABBOTT (MISCELLANEOUS) IMPLANT
GLOVE SURG LX 7.5 STRW (GLOVE) ×2
GLOVE SURG LX STRL 7.5 STRW (GLOVE) ×1 IMPLANT
GLOVE SURG TRIUMPH 8.0 PF LTX (GLOVE) ×3 IMPLANT
GOWN STRL REUS W/ TWL LRG LVL3 (GOWN DISPOSABLE) ×2 IMPLANT
GOWN STRL REUS W/TWL LRG LVL3 (GOWN DISPOSABLE) ×4
LENS IOL TECNIS TRC I 150 15.5 ×1 IMPLANT
LENS IOL TORIC 15.5 ×2 IMPLANT
LENS IOL TORIC 150 15.5 ×1 IMPLANT
MARKER SKIN DUAL TIP RULER LAB (MISCELLANEOUS) ×3 IMPLANT
NDL RETROBULBAR .5 NSTRL (NEEDLE) IMPLANT
NEEDLE FILTER BLUNT 18X 1/2SAF (NEEDLE) ×2
NEEDLE FILTER BLUNT 18X1 1/2 (NEEDLE) ×1 IMPLANT
PACK CATARACT BRASINGTON (MISCELLANEOUS) ×3 IMPLANT
PACK EYE AFTER SURG (MISCELLANEOUS) ×3 IMPLANT
PACK OPTHALMIC (MISCELLANEOUS) ×3 IMPLANT
RING MALYGIN 7.0 (MISCELLANEOUS) IMPLANT
SUT ETHILON 10-0 CS-B-6CS-B-6 (SUTURE)
SUT VICRYL  9 0 (SUTURE)
SUT VICRYL 9 0 (SUTURE) IMPLANT
SUTURE EHLN 10-0 CS-B-6CS-B-6 (SUTURE) IMPLANT
SYR 3ML LL SCALE MARK (SYRINGE) ×3 IMPLANT
SYR 5ML LL (SYRINGE) ×3 IMPLANT
SYR TB 1ML LUER SLIP (SYRINGE) ×3 IMPLANT
WATER STERILE IRR 250ML POUR (IV SOLUTION) ×3 IMPLANT
WIPE NON LINTING 3.25X3.25 (MISCELLANEOUS) ×3 IMPLANT

## 2016-07-29 NOTE — Anesthesia Procedure Notes (Signed)
Procedure Name: MAC Performed by: Mayme Genta Pre-anesthesia Checklist: Patient identified, Emergency Drugs available, Suction available, Timeout performed and Patient being monitored Patient Re-evaluated:Patient Re-evaluated prior to inductionOxygen Delivery Method: Nasal cannula Placement Confirmation: positive ETCO2

## 2016-07-29 NOTE — Telephone Encounter (Signed)
Last OV 06/29/2016 last filled by Dr.Walker 01/20/16 90 1rf

## 2016-07-29 NOTE — Op Note (Signed)
LOCATION:  [facility]   PREOPERATIVE DIAGNOSIS:  Nuclear sclerotic cataract of the left eye.  H25.12  POSTOPERATIVE DIAGNOSIS:  Nuclear sclerotic cataract of the left eye.   PROCEDURE:  Phacoemulsification with Toric posterior chamber intraocular lens placement of the left eye.   LENS:  Implant Name Type Inv. Item Serial No. Manufacturer Lot No. LRB No. Used  tecnis iol toric lens     [id number] [facility]   Left 1   ZCT150 15.5 D Toric intraocular lens with 1.5 diopters of cylindrical power with axis orientation at 175 degrees.   ULTRASOUND TIME: 15 % of 1 minutes, 1 seconds.  CDE 9.0   SURGEON:  Wyonia Hough, MD   ANESTHESIA:  Topical with tetracaine drops and 2% Xylocaine jelly, augmented with 1% preservative-free intracameral lidocaine.  COMPLICATIONS:  None.   DESCRIPTION OF PROCEDURE:  The patient was identified in the holding room and transported to the operating suite and placed in the supine position under the operating microscope.  The left eye was identified as the operative eye, and it was prepped and draped in the usual sterile ophthalmic fashion.    A clear-corneal paracentesis incision was made at the 1:30 position.  0.5 ml of preservative-free 1% lidocaine was injected into the anterior chamber. The anterior chamber was filled with Viscoat.  A 2.4 millimeter near clear corneal incision was then made at the 10:30 position.  A cystotome and capsulorrhexis forceps were then used to make a curvilinear capsulorrhexis.  Hydrodissection and hydrodelineation were then performed using balanced salt solution.   Phacoemulsification was then used in stop and chop fashion to remove the lens, nucleus and epinucleus.  The remaining cortex was aspirated using the irrigation and aspiration handpiece.  Provisc viscoelastic was then placed into the capsular bag to distend it for lens placement.  The Verion digital marker was used to align the implant at the intended  axis.   A 15.5 diopter lens was then injected into the capsular bag.  It was rotated clockwise until the axis marks on the lens were approximately 15 degrees in the counterclockwise direction to the intended alignment.  The viscoelastic was aspirated from the eye using the irrigation aspiration handpiece.  Then, a Koch spatula through the sideport incision was used to rotate the lens in a clockwise direction until the axis markings of the intraocular lens were lined up with the Verion alignment.  Balanced salt solution was then used to hydrate the wounds. Cefuroxime 0.1 ml of a 10mg /ml solution was injected into the anterior chamber for a dose of 1 mg of intracameral antibiotic at the completion of the case.    The eye was noted to have a physiologic pressure and there was no wound leak noted.   Timolol and Brimonidine drops were applied to the eye.  The patient was taken to the recovery room in stable condition having had no complications of anesthesia or surgery.  Timothy Turner 07/29/2016, 8:36 AM

## 2016-07-29 NOTE — Transfer of Care (Signed)
Immediate Anesthesia Transfer of Care Note  Patient: Timothy Turner  Procedure(s) Performed: Procedure(s): CATARACT EXTRACTION PHACO AND INTRAOCULAR LENS PLACEMENT (IOC)  Left eye IVA Topical (Left)  Patient Location: PACU  Anesthesia Type: MAC  Level of Consciousness: awake, alert  and patient cooperative  Airway and Oxygen Therapy: Patient Spontanous Breathing and Patient connected to supplemental oxygen  Post-op Assessment: Post-op Vital signs reviewed, Patient's Cardiovascular Status Stable, Respiratory Function Stable, Patent Airway and No signs of Nausea or vomiting  Post-op Vital Signs: Reviewed and stable  Complications: No apparent anesthesia complications

## 2016-07-29 NOTE — Anesthesia Preprocedure Evaluation (Signed)
Anesthesia Evaluation  Patient identified by MRN, date of birth, ID band Patient awake    Reviewed: Allergy & Precautions, H&P , NPO status , Patient's Chart, lab work & pertinent test results  Airway Mallampati: II  TM Distance: >3 FB Neck ROM: full    Dental no notable dental hx.    Pulmonary shortness of breath, COPD, former smoker,    Pulmonary exam normal        Cardiovascular hypertension, + Peripheral Vascular Disease  Normal cardiovascular exam+ Valvular Problems/Murmurs      Neuro/Psych    GI/Hepatic GERD  ,  Endo/Other  Hypothyroidism   Renal/GU Renal disease     Musculoskeletal   Abdominal   Peds  Hematology   Anesthesia Other Findings   Reproductive/Obstetrics                             Anesthesia Physical Anesthesia Plan  ASA: III  Anesthesia Plan: MAC   Post-op Pain Management:    Induction:   Airway Management Planned:   Additional Equipment:   Intra-op Plan:   Post-operative Plan:   Informed Consent: I have reviewed the patients History and Physical, chart, labs and discussed the procedure including the risks, benefits and alternatives for the proposed anesthesia with the patient or authorized representative who has indicated his/her understanding and acceptance.     Plan Discussed with:   Anesthesia Plan Comments:         Anesthesia Quick Evaluation

## 2016-07-29 NOTE — Anesthesia Postprocedure Evaluation (Signed)
Anesthesia Post Note  Patient: Timothy Turner  Procedure(s) Performed: Procedure(s) (LRB): CATARACT EXTRACTION PHACO AND INTRAOCULAR LENS PLACEMENT (IOC)  Left eye IVA Topical (Left)  Patient location during evaluation: PACU Anesthesia Type: MAC Level of consciousness: awake and alert and oriented Pain management: satisfactory to patient Vital Signs Assessment: post-procedure vital signs reviewed and stable Respiratory status: spontaneous breathing, nonlabored ventilation and respiratory function stable Cardiovascular status: blood pressure returned to baseline and stable Postop Assessment: Adequate PO intake and No signs of nausea or vomiting Anesthetic complications: no    Raliegh Ip

## 2016-07-29 NOTE — H&P (Signed)
The History and Physical notes are on paper, have been signed, and are to be scanned. The patient remains stable and unchanged from the H&P.   Previous H&P reviewed, patient examined, and there are no changes.  Erice Ahles 07/29/2016 7:42 AM

## 2016-07-30 ENCOUNTER — Encounter: Payer: Self-pay | Admitting: Ophthalmology

## 2016-08-12 ENCOUNTER — Encounter: Payer: Self-pay | Admitting: *Deleted

## 2016-08-13 NOTE — Discharge Instructions (Signed)
Cataract Surgery, Care After °Refer to this sheet in the next few weeks. These instructions provide you with information about caring for yourself after your procedure. Your health care provider may also give you more specific instructions. Your treatment has been planned according to current medical practices, but problems sometimes occur. Call your health care provider if you have any problems or questions after your procedure. °What can I expect after the procedure? °After the procedure, it is common to have: °· Itching. °· Discomfort. °· Fluid discharge. °· Sensitivity to light and to touch. °· Bruising. °Follow these instructions at home: °Eye Care  °· Check your eye every day for signs of infection. Watch for: °¨ Redness, swelling, or pain. °¨ Fluid, blood, or pus. °¨ Warmth. °¨ Bad smell. °Activity  °· Avoid strenuous activities, such as playing contact sports, for as long as told by your health care provider. °· Do not drive or operate heavy machinery until your health care provider approves. °· Do not bend or lift heavy objects . Bending increases pressure in the eye. You can walk, climb stairs, and do light household chores. °· Ask your health care provider when you can return to work. If you work in a dusty environment, you may be advised to wear protective eyewear for a period of time. °General instructions  °· Take or apply over-the-counter and prescription medicines only as told by your health care provider. This includes eye drops. °· Do not touch or rub your eyes. °· If you were given a protective shield, wear it as told by your health care provider. If you were not given a protective shield, wear sunglasses as told by your health care provider to protect your eyes. °· Keep the area around your eye clean and dry. Avoid swimming or allowing water to hit you directly in the face while showering until told by your health care provider. Keep soap and shampoo out of your eyes. °· Do not put a contact lens  into the affected eye or eyes until your health care provider approves. °· Keep all follow-up visits as told by your health care provider. This is important. °Contact a health care provider if: ° °· You have increased bruising around your eye. °· You have pain that is not helped with medicine. °· You have a fever. °· You have redness, swelling, or pain in your eye. °· You have fluid, blood, or pus coming from your incision. °· Your vision gets worse. °Get help right away if: °· You have sudden vision loss. °This information is not intended to replace advice given to you by your health care provider. Make sure you discuss any questions you have with your health care provider. °Document Released: 12/26/2004 Document Revised: 10/17/2015 Document Reviewed: 04/18/2015 °Elsevier Interactive Patient Education © 2017 [employer] ° ° ° ° °General Anesthesia, Adult, Care After °These instructions provide you with information about caring for yourself after your procedure. Your health care provider may also give you more specific instructions. Your treatment has been planned according to current medical practices, but problems sometimes occur. Call your health care provider if you have any problems or questions after your procedure. °What can I expect after the procedure? °After the procedure, it is common to have: °· Vomiting. °· A sore throat. °· Mental slowness. °It is common to feel: °· Nauseous. °· Cold or shivery. °· Sleepy. °· Tired. °· Sore or achy, even in parts of your body where you did not have surgery. °Follow these instructions at   home: °For at least 24 hours after the procedure:  °· Do not: °¨ Participate in activities where you could fall or become injured. °¨ Drive. °¨ Use heavy machinery. °¨ Drink alcohol. °¨ Take sleeping pills or medicines that cause drowsiness. °¨ Make important decisions or sign legal documents. °¨ Take care of children on your own. °· Rest. °Eating and drinking  °· If you vomit, drink  water, juice, or soup when you can drink without vomiting. °· Drink enough fluid to keep your urine clear or pale yellow. °· Make sure you have little or no nausea before eating solid foods. °· Follow the diet recommended by your health care provider. °General instructions  °· Have a responsible adult stay with you until you are awake and alert. °· Return to your normal activities as told by your health care provider. Ask your health care provider what activities are safe for you. °· Take over-the-counter and prescription medicines only as told by your health care provider. °· If you smoke, do not smoke without supervision. °· Keep all follow-up visits as told by your health care provider. This is important. °Contact a health care provider if: °· You continue to have nausea or vomiting at home, and medicines are not helpful. °· You cannot drink fluids or start eating again. °· You cannot urinate after 8-12 hours. °· You develop a skin rash. °· You have fever. °· You have increasing redness at the site of your procedure. °Get help right away if: °· You have difficulty breathing. °· You have chest pain. °· You have unexpected bleeding. °· You feel that you are having a life-threatening or urgent problem. °This information is not intended to replace advice given to you by your health care provider. Make sure you discuss any questions you have with your health care provider. °Document Released: 09/14/2000 Document Revised: 11/11/2015 Document Reviewed: 05/23/2015 °Elsevier Interactive Patient Education © 2017 [employer] ° °

## 2016-08-19 ENCOUNTER — Encounter
Admission: RE | Disposition: A | Discharge status: Home / Self Care | Payer: Self-pay | Source: Ambulatory Visit | Attending: Ophthalmology

## 2016-08-19 ENCOUNTER — Ambulatory Visit: Payer: Medicare Other | Admitting: Anesthesiology

## 2016-08-19 ENCOUNTER — Ambulatory Visit
Admission: RE | Admit: 2016-08-19 | Discharge: 2016-08-19 | Disposition: A | Discharge status: Home / Self Care | Payer: Medicare Other | Source: Ambulatory Visit | Attending: Ophthalmology | Admitting: Ophthalmology

## 2016-08-19 ENCOUNTER — Encounter: Payer: Self-pay | Admitting: Ophthalmology

## 2016-08-19 DIAGNOSIS — I10 Essential (primary) hypertension: Secondary | ICD-10-CM | POA: Insufficient documentation

## 2016-08-19 DIAGNOSIS — Z87891 Personal history of nicotine dependence: Secondary | ICD-10-CM | POA: Diagnosis not present

## 2016-08-19 DIAGNOSIS — K219 Gastro-esophageal reflux disease without esophagitis: Secondary | ICD-10-CM | POA: Insufficient documentation

## 2016-08-19 DIAGNOSIS — H2511 Age-related nuclear cataract, right eye: Secondary | ICD-10-CM | POA: Diagnosis not present

## 2016-08-19 DIAGNOSIS — E039 Hypothyroidism, unspecified: Secondary | ICD-10-CM | POA: Diagnosis not present

## 2016-08-19 DIAGNOSIS — N289 Disorder of kidney and ureter, unspecified: Secondary | ICD-10-CM | POA: Insufficient documentation

## 2016-08-19 DIAGNOSIS — I739 Peripheral vascular disease, unspecified: Secondary | ICD-10-CM | POA: Diagnosis not present

## 2016-08-19 DIAGNOSIS — J449 Chronic obstructive pulmonary disease, unspecified: Secondary | ICD-10-CM | POA: Insufficient documentation

## 2016-08-19 HISTORY — PX: CATARACT EXTRACTION W/PHACO: SHX586

## 2016-08-19 SURGERY — PHACOEMULSIFICATION, CATARACT, WITH IOL INSERTION
Anesthesia: Monitor Anesthesia Care | Laterality: Right | Wound class: Clean

## 2016-08-19 MED ORDER — LIDOCAINE HCL (PF) 2 % IJ SOLN
INTRAOCULAR | Status: DC | PRN
  Administered 2016-08-19: 1 mL via INTRAOCULAR

## 2016-08-19 MED ORDER — NA HYALUR & NA CHOND-NA HYALUR 0.4-0.35 ML IO KIT
PACK | INTRAOCULAR | Status: DC | PRN
  Administered 2016-08-19: 1 mL via INTRAOCULAR

## 2016-08-19 MED ORDER — BRIMONIDINE TARTRATE-TIMOLOL 0.2-0.5 % OP SOLN
OPHTHALMIC | Status: DC | PRN
  Administered 2016-08-19: 1 [drp] via OPHTHALMIC

## 2016-08-19 MED ORDER — MIDAZOLAM HCL 2 MG/2ML IJ SOLN
INTRAMUSCULAR | Status: DC | PRN
  Administered 2016-08-19: 2 mg via INTRAVENOUS

## 2016-08-19 MED ORDER — LACTATED RINGERS IV SOLN: INTRAVENOUS | Status: DC

## 2016-08-19 MED ORDER — CYCLOPENTOLATE HCL 2 % OP SOLN
1.0000 [drp] | OPHTHALMIC | Status: DC | PRN
  Administered 2016-08-19 (×3): 1 [drp] via OPHTHALMIC

## 2016-08-19 MED ORDER — FENTANYL CITRATE (PF) 100 MCG/2ML IJ SOLN
INTRAMUSCULAR | Status: DC | PRN
  Administered 2016-08-19: 50 ug via INTRAVENOUS

## 2016-08-19 MED ORDER — EPINEPHRINE PF 1 MG/ML IJ SOLN
INTRAOCULAR | Status: DC | PRN
  Administered 2016-08-19: 65 mL via OPHTHALMIC

## 2016-08-19 MED ORDER — ACETAMINOPHEN 160 MG/5ML PO SOLN: 325.0000 mg | ORAL | Status: DC | PRN

## 2016-08-19 MED ORDER — TETRACAINE HCL 0.5 % OP SOLN
1.0000 [drp] | OPHTHALMIC | Status: DC | PRN
  Administered 2016-08-19 (×2): 1 [drp] via OPHTHALMIC

## 2016-08-19 MED ORDER — MOXIFLOXACIN HCL 0.5 % OP SOLN
1.0000 [drp] | OPHTHALMIC | Status: DC | PRN
  Administered 2016-08-19 (×3): 1 [drp] via OPHTHALMIC

## 2016-08-19 MED ORDER — PHENYLEPHRINE HCL 10 % OP SOLN
1.0000 [drp] | OPHTHALMIC | Status: DC | PRN
  Administered 2016-08-19 (×3): 1 [drp] via OPHTHALMIC

## 2016-08-19 MED ORDER — ACETAMINOPHEN 325 MG PO TABS: 325.0000 mg | ORAL_TABLET | ORAL | Status: DC | PRN

## 2016-08-19 SURGICAL SUPPLY — 25 items
CANNULA ANT/CHMB 27GA (MISCELLANEOUS) ×3 IMPLANT
CARTRIDGE ABBOTT (MISCELLANEOUS) IMPLANT
GLOVE SURG LX 7.5 STRW (GLOVE) ×2
GLOVE SURG LX STRL 7.5 STRW (GLOVE) ×1 IMPLANT
GLOVE SURG TRIUMPH 8.0 PF LTX (GLOVE) ×3 IMPLANT
GOWN STRL REUS W/ TWL LRG LVL3 (GOWN DISPOSABLE) ×2 IMPLANT
GOWN STRL REUS W/TWL LRG LVL3 (GOWN DISPOSABLE) ×4
MARKER SKIN DUAL TIP RULER LAB (MISCELLANEOUS) ×3 IMPLANT
NDL RETROBULBAR .5 NSTRL (NEEDLE) IMPLANT
NEEDLE FILTER BLUNT 18X 1/2SAF (NEEDLE) ×2
NEEDLE FILTER BLUNT 18X1 1/2 (NEEDLE) ×1 IMPLANT
PACK CATARACT BRASINGTON (MISCELLANEOUS) ×3 IMPLANT
PACK EYE AFTER SURG (MISCELLANEOUS) ×3 IMPLANT
PACK OPTHALMIC (MISCELLANEOUS) ×3 IMPLANT
RING MALYGIN 7.0 (MISCELLANEOUS) IMPLANT
SUT ETHILON 10-0 CS-B-6CS-B-6 (SUTURE)
SUT VICRYL  9 0 (SUTURE)
SUT VICRYL 9 0 (SUTURE) IMPLANT
SUTURE EHLN 10-0 CS-B-6CS-B-6 (SUTURE) IMPLANT
SYR 3ML LL SCALE MARK (SYRINGE) ×3 IMPLANT
SYR 5ML LL (SYRINGE) ×3 IMPLANT
SYR TB 1ML LUER SLIP (SYRINGE) ×3 IMPLANT
WATER STERILE IRR 250ML POUR (IV SOLUTION) ×3 IMPLANT
WIPE NON LINTING 3.25X3.25 (MISCELLANEOUS) ×3 IMPLANT
tecnis toric aspheric IOL (Intraocular Lens) ×3 IMPLANT

## 2016-08-19 NOTE — H&P (Signed)
The History and Physical notes are on paper, have been signed, and are to be scanned. The patient remains stable and unchanged from the H&P.   Previous H&P reviewed, patient examined, and there are no changes.  Timothy Turner 08/19/2016 9:04 AM

## 2016-08-19 NOTE — Transfer of Care (Signed)
Immediate Anesthesia Transfer of Care Note  Patient: Timothy Turner  Procedure(s) Performed: Procedure(s) with comments: CATARACT EXTRACTION PHACO AND INTRAOCULAR LENS PLACEMENT (IOC) right  TORIC (Right) - prefers early morning  Patient Location: PACU  Anesthesia Type: MAC  Level of Consciousness: awake, alert  and patient cooperative  Airway and Oxygen Therapy: Patient Spontanous Breathing and Patient connected to supplemental oxygen  Post-op Assessment: Post-op Vital signs reviewed, Patient's Cardiovascular Status Stable, Respiratory Function Stable, Patent Airway and No signs of Nausea or vomiting  Post-op Vital Signs: Reviewed and stable  Complications: No apparent anesthesia complications

## 2016-08-19 NOTE — Anesthesia Procedure Notes (Signed)
Procedure Name: MAC Performed by: Brantlee Hinde Pre-anesthesia Checklist: Patient identified, Emergency Drugs available, Suction available, Timeout performed and Patient being monitored Patient Re-evaluated:Patient Re-evaluated prior to inductionOxygen Delivery Method: Nasal cannula Placement Confirmation: positive ETCO2     

## 2016-08-19 NOTE — Op Note (Signed)
LOCATION:  [facility]   PREOPERATIVE DIAGNOSIS:  Nuclear sclerotic cataract of the right eye.  H25.11   POSTOPERATIVE DIAGNOSIS:  Nuclear sclerotic cataract of the right eye.   PROCEDURE:  Phacoemulsification with Toric posterior chamber intraocular lens placement of the right eye.   LENS:   Implant Name Type Inv. Item Serial No. Manufacturer Lot No. LRB No. Used  tecnis toric aspheric IOL Intraocular Lens   [id number] [employer]   Right 1     ZCT300 15.5 D Toric intraocular lens with 3.0 diopters of cylindrical power with axis orientation at 50 degrees.   ULTRASOUND TIME: 18 % of 1 minutes, 16 seconds.  CDE 13.8   SURGEON:  Wyonia Hough, MD   ANESTHESIA: Topical with tetracaine drops and 2% Xylocaine jelly, augmented with 1% preservative-free intracameral lidocaine. .   COMPLICATIONS:  None.   DESCRIPTION OF PROCEDURE:  The patient was identified in the holding room and transported to the operating suite and placed in the supine position under the operating microscope.  The right eye was identified as the operative eye, and it was prepped and draped in the usual sterile ophthalmic fashion.    A clear-corneal paracentesis incision was made at the 12:00 position.  0.5 ml of preservative-free 1% lidocaine was injected into the anterior chamber. The anterior chamber was filled with Viscoat.  A 2.4 millimeter near clear corneal incision was then made at the 9:00 position.  A cystotome and capsulorrhexis forceps were then used to make a curvilinear capsulorrhexis.  Hydrodissection and hydrodelineation were then performed using balanced salt solution.   Phacoemulsification was then used in stop and chop fashion to remove the lens, nucleus and epinucleus.  The remaining cortex was aspirated using the irrigation and aspiration handpiece.  Provisc viscoelastic was then placed into the capsular bag to distend it for lens placement.  The Verion digital marker was used to  align the implant at the intended axis.   A Toric lens was then injected into the capsular bag.  It was rotated clockwise until the axis marks on the lens were approximately 15 degrees in the counterclockwise direction to the intended alignment.  The viscoelastic was aspirated from the eye using the irrigation aspiration handpiece.  Then, a Koch spatula through the sideport incision was used to rotate the lens in a clockwise direction until the axis markings of the intraocular lens were lined up with the Verion alignment.  Balanced salt solution was then used to hydrate the wounds. Cefuroxime 0.1 ml of a 10mg /ml solution was injected into the anterior chamber for a dose of 1 mg of intracameral antibiotic at the completion of the case.    The eye was noted to have a physiologic pressure and there was no wound leak noted.   Timolol and Brimonidine drops were applied to the eye.  The patient was taken to the recovery room in stable condition having had no complications of anesthesia or surgery.  Timothy Turner 08/19/2016, 10:19 AM

## 2016-08-19 NOTE — Anesthesia Postprocedure Evaluation (Signed)
Anesthesia Post Note  Patient: Timothy Turner  Procedure(s) Performed: Procedure(s) (LRB): CATARACT EXTRACTION PHACO AND INTRAOCULAR LENS PLACEMENT (IOC) right  TORIC (Right)  Patient location during evaluation: PACU Anesthesia Type: MAC Level of consciousness: awake and alert and oriented Pain management: satisfactory to patient Vital Signs Assessment: post-procedure vital signs reviewed and stable Respiratory status: spontaneous breathing, nonlabored ventilation and respiratory function stable Cardiovascular status: blood pressure returned to baseline and stable Postop Assessment: Adequate PO intake and No signs of nausea or vomiting Anesthetic complications: no    Raliegh Ip

## 2016-08-19 NOTE — Anesthesia Preprocedure Evaluation (Addendum)
Anesthesia Evaluation  Patient identified by MRN, date of birth, ID band Patient awake    Reviewed: Allergy & Precautions, H&P , NPO status , Patient's Chart, lab work & pertinent test results  Airway Mallampati: II  TM Distance: >3 FB Neck ROM: full    Dental  (+) Upper Dentures, Lower Dentures   Pulmonary shortness of breath, COPD, former smoker,    Pulmonary exam normal        Cardiovascular hypertension, + angina + Peripheral Vascular Disease  Normal cardiovascular exam+ Valvular Problems/Murmurs      Neuro/Psych    GI/Hepatic GERD  ,  Endo/Other  Hypothyroidism   Renal/GU Renal disease     Musculoskeletal   Abdominal   Peds  Hematology   Anesthesia Other Findings   Reproductive/Obstetrics                            Anesthesia Physical  Anesthesia Plan  ASA: III  Anesthesia Plan: MAC   Post-op Pain Management:    Induction:   Airway Management Planned:   Additional Equipment:   Intra-op Plan:   Post-operative Plan:   Informed Consent: I have reviewed the patients History and Physical, chart, labs and discussed the procedure including the risks, benefits and alternatives for the proposed anesthesia with the patient or authorized representative who has indicated his/her understanding and acceptance.     Plan Discussed with:   Anesthesia Plan Comments:         Anesthesia Quick Evaluation

## 2016-08-20 ENCOUNTER — Encounter: Payer: Self-pay | Admitting: Ophthalmology

## 2016-08-21 ENCOUNTER — Other Ambulatory Visit: Payer: Self-pay | Admitting: *Deleted

## 2016-08-21 NOTE — Telephone Encounter (Signed)
[facility] requested a medication refill for [facility] inhaler  Contact [phone] Ref # [id number]

## 2016-08-21 NOTE — Telephone Encounter (Signed)
Last OV 06/29/16 last filled 06/29/16 60 1rf

## 2016-08-22 MED ORDER — FLUTICASONE FUROATE-VILANTEROL 100-25 MCG/INH IN AEPB: 1.0000 | INHALATION_SPRAY | Freq: Every day | RESPIRATORY_TRACT | 1 refills | Status: DC

## 2016-08-26 ENCOUNTER — Encounter: Payer: Self-pay | Admitting: Family Medicine

## 2016-08-26 ENCOUNTER — Ambulatory Visit (INDEPENDENT_AMBULATORY_CARE_PROVIDER_SITE_OTHER): Payer: Medicare Other | Admitting: Family Medicine

## 2016-08-26 VITALS — BP 120/64 | HR 57 | Temp 97.7°F | Ht 72.0 in | Wt 179.1 lb

## 2016-08-26 DIAGNOSIS — Z0000 Encounter for general adult medical examination without abnormal findings: Secondary | ICD-10-CM

## 2016-08-26 DIAGNOSIS — E039 Hypothyroidism, unspecified: Secondary | ICD-10-CM

## 2016-08-26 DIAGNOSIS — M791 Myalgia, unspecified site: Secondary | ICD-10-CM

## 2016-08-26 DIAGNOSIS — F4321 Adjustment disorder with depressed mood: Secondary | ICD-10-CM

## 2016-08-26 DIAGNOSIS — J449 Chronic obstructive pulmonary disease, unspecified: Secondary | ICD-10-CM | POA: Diagnosis not present

## 2016-08-26 DIAGNOSIS — R1312 Dysphagia, oropharyngeal phase: Secondary | ICD-10-CM

## 2016-08-26 DIAGNOSIS — I739 Peripheral vascular disease, unspecified: Secondary | ICD-10-CM | POA: Diagnosis not present

## 2016-08-26 LAB — TSH: TSH: 3.97 u[IU]/mL (ref 0.35–4.50)

## 2016-08-26 MED ORDER — CLONAZEPAM 2 MG PO TABS: 2.0000 mg | ORAL_TABLET | Freq: Three times a day (TID) | ORAL | 1 refills | Status: DC | PRN

## 2016-08-26 MED ORDER — EZETIMIBE 10 MG PO TABS: 10.0000 mg | ORAL_TABLET | Freq: Every day | ORAL | 1 refills | Status: DC

## 2016-08-26 MED ORDER — LEVOTHYROXINE SODIUM 75 MCG PO TABS: 75.0000 ug | ORAL_TABLET | Freq: Every day | ORAL | 1 refills | Status: DC

## 2016-08-26 NOTE — Patient Instructions (Addendum)
       This is a list of the screening recommended for you and due dates:  Health Maintenance  Topic Date Due  . Tetanus Vaccine  02/09/2022  . Colon Cancer Screening  03/08/2022  . Flu Shot  Completed  . Pneumonia vaccines  Completed     Nice to see you. We'll check your thyroid today. We will get you to see your pulmonologist. I refilled your medications as requested.

## 2016-08-26 NOTE — Assessment & Plan Note (Signed)
Amazingly has had no issues with this over the last 3 weeks. Encouraged him to chew small bites of food very well and continue to monitor for any recurrence.

## 2016-08-26 NOTE — Assessment & Plan Note (Signed)
Check TSH.  Continue Synthroid. 

## 2016-08-26 NOTE — Assessment & Plan Note (Signed)
Seems to be relatively stable. No SI. We'll continue Lexapro. Klonopin refilled. If he gets drowsy with this he'll let us know.

## 2016-08-26 NOTE — Progress Notes (Signed)
Timothy Rumps, MD Phone: [phone]  Timothy Turner is a 76 y.o. male who presents today for follow-up.  Peripheral vascular disease: Notes blockages in right and left legs. He notes claudication in the past though none recently. He walks with no pain in his legs. He has been following with cardiology and previously saw vascular surgery for this. He is currently on Zetia.  Hypothyroidism: Taking his Synthroid. No skin changes. Notes he has gained a little bit of weight. Some cold intolerance though this is chronic and unchanged.  COPD: Patient wonders if he has alpha-1 antitrypsin deficiency. The breo was not beneficial. He has not been using this. He still gets short of breath at times particularly with bending over. No cough. No reported wheezing.  Depression: Patient does note some depression and anxiety regarding not knowing what is going on with him given his multiple health issues. He does take Klonopin. No SI. Does take Effexor.  Additionally notes that over the last 3 weeks has been able to swallow with no issues. Previously was having difficulty swallowing anything more than liquids. He has been chewing his food very well and swallowing soft small amounts of food.  PMH: Former smoker   ROS see history of present illness  Objective  Physical Exam Vitals:   08/26/16 0840  BP: 120/64  Pulse: (!) 57  Temp: 97.7 F (36.5 C)    BP Readings from Last 3 Encounters:  08/26/16 120/64  08/19/16 132/78  07/29/16 135/83   Wt Readings from Last 3 Encounters:  08/26/16 179 lb 1.9 oz (81.2 kg)  08/19/16 176 lb (79.8 kg)  07/29/16 177 lb (80.3 kg)    Physical Exam  Constitutional: No distress.  Cardiovascular: Normal rate, regular rhythm and normal heart sounds.   1+ DP pulses bilaterally, 2+ PT pulses bilaterally, bilateral feet are warm with no skin breakdown or ulcerations  Pulmonary/Chest: Effort normal. No respiratory distress. He has wheezes (Faint wheezes in  bilateral bases). He has no rales.  Musculoskeletal: He exhibits no edema.  Neurological: He is alert. Gait normal.  Skin: Skin is warm and dry. He is not diaphoretic.     Assessment/Plan: Please see individual problem list.  PVD (peripheral vascular disease) Seems to be doing quite well with this. No claudication. Does have palpable pulses. No evidence of skin breakdown or ulceration. He'll continue cholesterol control and continue to monitor.  Dysphagia, oropharyngeal phase Amazingly has had no issues with this over the last 3 weeks. Encouraged him to chew small bites of food very well and continue to monitor for any recurrence.  COPD (chronic obstructive pulmonary disease) ([place]) Still continues to have symptoms and has not responded any inhalers. We will refer back to his pulmonologist for further evaluation.  Hypothyroidism Check TSH. Continue Synthroid.  Adjustment disorder with depressed mood Seems to be relatively stable. No SI. We'll continue Lexapro. Klonopin refilled. If he gets drowsy with this he'll let us know.   Orders Placed This Encounter  Procedures  . TSH    Meds ordered this encounter  Medications  . clonazePAM (KLONOPIN) 2 MG tablet    Sig: Take 1 tablet (2 mg total) by mouth 3 (three) times daily as needed (severe muscle spasm).    Dispense:  90 tablet    Refill:  1  . DISCONTD: ezetimibe (ZETIA) 10 MG tablet    Sig: Take 1 tablet (10 mg total) by mouth daily.    Dispense:  90 tablet    Refill:  1  .  DISCONTD: levothyroxine (SYNTHROID, LEVOTHROID) 75 MCG tablet    Sig: Take 1 tablet (75 mcg total) by mouth daily.    Dispense:  90 tablet    Refill:  1  . levothyroxine (SYNTHROID, LEVOTHROID) 75 MCG tablet    Sig: Take 1 tablet (75 mcg total) by mouth daily.    Dispense:  90 tablet    Refill:  1  . ezetimibe (ZETIA) 10 MG tablet    Sig: Take 1 tablet (10 mg total) by mouth daily.    Dispense:  90 tablet    Refill:  1   Timothy Rumps,  MD [place]

## 2016-08-26 NOTE — Assessment & Plan Note (Signed)
Still continues to have symptoms and has not responded any inhalers. We will refer back to his pulmonologist for further evaluation.

## 2016-08-26 NOTE — Progress Notes (Signed)
Subjective:   Timothy Turner is a 76 y.o. male who presents for Medicare Annual/Subsequent preventive examination.  Review of Systems:  No ROS.  Medicare Wellness Visit.  Cardiac Risk Factors include: male gender;advanced age (>106men, >60 women)     Objective:    Vitals: BP 120/64   Pulse (!) 57   Temp 97.7 F (36.5 C) (Oral)   Ht 6' (1.829 m)   Wt 179 lb 1.9 oz (81.2 kg)   SpO2 96%   BMI 24.29 kg/m   Body mass index is 24.29 kg/m.  Tobacco History  Smoking Status  . Former Smoker  . Packs/day: 1.00  . Years: 30.00  . Quit date: 06/22/2004  Smokeless Tobacco  . Never Used     Counseling given: Not Answered   Past Medical History:  Diagnosis Date  . Aspiration precautions    uses thicken in liquids.  Eats pureed food.  . Barrett esophagus   . Depression   . Gallstones   . GERD (gastroesophageal reflux disease)   . Heart murmur    History of  . Hemorrhoids   . Hypertension   . Hypothyroidism   . Mitral valve disease   . Myalgia    Multiple  . Neuromuscular disorder ([place])   . PAD (peripheral artery disease) (HCC)    lower legs  . Pain syndrome, chronic    Severe  . Pneumonia    three times, last time >10 years ago  . Psoriasis   . Rheumatic heart valve incompetence   . Shortness of breath dyspnea   . Thyroiditis    Past Surgical History:  Procedure Laterality Date  . BACK SURGERY    . CARDIAC CATHETERIZATION N/A 02/22/2015   Procedure: Left Heart Cath and Coronary Angiography;  Surgeon: Minna Merritts, MD;  Location: [facility];  Service: Cardiovascular;  Laterality: N/A;  . CATARACT EXTRACTION W/PHACO Left 07/29/2016   Procedure: CATARACT EXTRACTION PHACO AND INTRAOCULAR LENS PLACEMENT (IOC)  Left eye IVA Topical;  Surgeon: Leandrew Koyanagi, MD;  Location: [facility];  Service: Ophthalmology;  Laterality: Left;  . CATARACT EXTRACTION W/PHACO Right 08/19/2016   Procedure: CATARACT EXTRACTION PHACO AND INTRAOCULAR LENS  PLACEMENT ([place]) right  TORIC;  Surgeon: Leandrew Koyanagi, MD;  Location: [facility];  Service: Ophthalmology;  Laterality: Right;  prefers early morning  . CHOLECYSTECTOMY  08/03/13  . COLONOSCOPY  03-08-12   Dr Donnella Sham  . DIRECT LARYNGOSCOPY N/A 02/17/2016   Procedure: DIRECT LARYNGOSCOPY;  Surgeon: Jodi Marble, MD;  Location: [facility];  Service: ENT;  Laterality: N/A;  . ESOPHAGEAL MANOMETRY N/A 02/27/2015   Procedure: ESOPHAGEAL MANOMETRY (EM);  Surgeon: Lollie Sails, MD;  Location: [facility];  Service: Endoscopy;  Laterality: N/A;  . ESOPHAGOGASTRODUODENOSCOPY (EGD) WITH PROPOFOL N/A 02/18/2015   Procedure: ESOPHAGOGASTRODUODENOSCOPY (EGD) WITH PROPOFOL;  Surgeon: Lollie Sails, MD;  Location: [facility] ENDOSCOPY;  Service: Endoscopy;  Laterality: N/A;  . ESOPHAGOSCOPY W/ BOTOX INJECTION N/A 02/17/2016   Procedure: ESOPHAGOSCOPY WITH BOTOX INJECTION  ;  Surgeon: Jodi Marble, MD;  Location: [facility];  Service: ENT;  Laterality: N/A;  . HEMORROIDECTOMY    . UPPER GI ENDOSCOPY  03-06-2013   Dr Donnella Sham   Family History  Problem Relation Age of Onset  . Heart failure Mother   . Heart failure Father   . Cancer Sister   . Aneurysm Sister     Brain   History  Sexual Activity  . Sexual activity:  No    Outpatient Encounter Prescriptions as of 08/26/2016  Medication Sig  . albuterol (PROVENTIL HFA;VENTOLIN HFA) 108 (90 Base) MCG/ACT inhaler Inhale 2 puffs into the lungs every 6 (six) hours as needed for wheezing or shortness of breath.  . calcipotriene (DOVONEX) 0.005 % cream Apply 1 application topically daily as needed. Reported on 12/03/2015  . celecoxib (CELEBREX) 200 MG capsule Take 1 capsule (200 mg total) by mouth 2 (two) times daily.  . cetirizine (ZYRTEC) 10 MG tablet Take 10 mg by mouth daily as needed for allergies.   . Cholecalciferol (VITAMIN D-3 PO) Take 1,000 Units by mouth 2 (two) times daily.   . clobetasol (TEMOVATE) 0.05  % cream Apply topically as needed. Reported on 12/03/2015  . clonazePAM (KLONOPIN) 2 MG tablet Take 1 tablet (2 mg total) by mouth 3 (three) times daily as needed (severe muscle spasm).  . colesevelam (WELCHOL) 625 MG tablet TAKE 3 TABLETS TWICE DAILY WITH MEALS (NEED TO CONTACT OFFICE TO SCHEDULE FUTURE APPOINTMENT AND REFILLS) (Patient taking differently: TAKE 3 TABLETS BY MOUTH IN THE EVENING)  . colestipol (COLESTID) 1 G tablet Take 3 tablets (3 g total) by mouth 2 (two) times daily.  [name] escitalopram (LEXAPRO) 10 MG tablet Take 1 tablet (10 mg total) by mouth daily.  [name] esomeprazole (NEXIUM) 40 MG packet Take 40 mg by mouth daily before breakfast. (Patient taking differently: Take 40 mg by mouth at bedtime. )  . ezetimibe (ZETIA) 10 MG tablet Take 1 tablet (10 mg total) by mouth daily.  . finasteride (PROSCAR) 5 MG tablet Take 5 mg by mouth daily.    . fluticasone (FLONASE) 50 MCG/ACT nasal spray Place into both nostrils daily as needed for allergies or rhinitis.  . fluticasone furoate-vilanterol (BREO ELLIPTA) 100-25 MCG/INH AEPB Inhale 1 puff into the lungs daily.  [name] levothyroxine (SYNTHROID, LEVOTHROID) 75 MCG tablet Take 1 tablet (75 mcg total) by mouth daily.  . Multiple Vitamins-Minerals (CENTRUM SILVER PO) Take 1 tablet by mouth every evening.   . Omega-3 350 MG CPDR Take 350 mg by mouth daily.  . OnabotulinumtoxinA (BOTOX IJ) Inject as directed every 3 (three) months. As needed for dysphagia  . Red Yeast Rice Extract (RED YEAST RICE PO) Take 1 capsule by mouth 2 (two) times daily.  . Testosterone (AXIRON) 30 MG/ACT SOLN Apply 1 pump under each armpit every night   No facility-administered encounter medications on file as of 08/26/2016.     Activities of Daily Living In your present state of health, do you have any difficulty performing the following activities: 08/26/2016 08/19/2016  Hearing? N N  Vision? N -  Difficulty concentrating or making decisions? N N  Walking or climbing stairs?  Y N  Dressing or bathing? N N  Doing errands, shopping? N -  Preparing Food and eating ? N -  Using the Toilet? N -  In the past six months, have you accidently leaked urine? N -  Do you have problems with loss of bowel control? N -  Managing your Medications? N -  Managing your Finances? N -  Housekeeping or managing your Housekeeping? Y -  Some recent data might be hidden    Patient Care Team: Leone Haven, MD as PCP - General (Family Medicine) [name], MD (General Surgery) Jackolyn Confer, MD (Internal Medicine)   Assessment:    This is a routine wellness examination for Jamarcus. The goal of the wellness visit is to assist the patient how to  close the gaps in care and create a preventative care plan for the patient.   Osteoporosis risk reviewed.  Medications reviewed; taking without issues or barriers.  Safety issues reviewed; smoke detectors in the home. Firearms locked up in the home. Wears seatbelts when driving or riding with others. Patient does wear sunscreen or protective clothing when in direct sunlight. No violence in the home.  Patient is alert, normal appearance, oriented to person/place/and time. Correctly identified the president of the [place], recall of 3/3 objects, and performing simple calculations.  Patient displays appropriate judgement and can read correct time from watch face.  No new identified risk were noted.  No failures at ADL's or IADL's.   BMI- discussed the importance of a healthy diet, water intake and exercise. Educational material provided.   Diet: Breakfast: Ensure with milk 6oz  Lunch: Ensure with milk 6oz Dinner: Soft foods, soup, hamburger Daily fluid intake: thickened liquids  Dental- every six months.  Eye- Visual acuity not assessed per patient preference since they have regular follow up with the ophthalmologist.  Wears corrective lenses.  Sleep patterns- Sleeps 3-4 hours at night with head raised.  Health  maintenance gaps- closed.  Patient Concerns: None at this time. Follow up with PCP as needed.  Exercise Activities and Dietary recommendations Current Exercise Habits: The patient does not participate in regular exercise at present  Goals    . Healthy Lifestyle          Stay hydrated Stay active, as tolerated Pace with adding food to diet      Fall Risk Fall Risk  08/26/2016 12/18/2015 11/12/2015 11/12/2014 12/19/2013  Falls in the past year? No No Yes No No  Injury with Fall? - - No - -   Depression Screen PHQ 2/9 Scores 08/26/2016 12/18/2015 11/12/2015 12/17/2014  PHQ - 2 Score 1 0 0 6  PHQ- 9 Score 2 - - 25    Cognitive Function MMSE - Mini Mental State Exam 08/26/2016  Orientation to time 5  Orientation to Place 5  Registration 3  Attention/ Calculation 5  Recall 3  Language- name 2 objects 2  Language- repeat 1  Language- follow 3 step command 3  Language- read & follow direction 1  Write a sentence 1  Copy design 1  Total score 30        Immunization History  Administered Date(s) Administered  . Influenza Split 03/12/2011, 03/14/2012  . Influenza, High Dose Seasonal PF 02/28/2015, 03/25/2016  . Influenza,inj,Quad PF,36+ Mos 03/31/2013, 04/24/2014  . Pneumococcal Conjugate-13 12/19/2013  . Pneumococcal Polysaccharide-23 08/12/2011  . Tdap 02/10/2012  . Zoster 02/17/2003   Screening Tests Health Maintenance  Topic Date Due  . TETANUS/TDAP  02/09/2022  . COLONOSCOPY  03/08/2022  . INFLUENZA VACCINE  Completed  . PNA vac Low Risk Adult  Completed      Plan:    End of life planning; Advance aging; Advanced directives discussed. Copy of current HCPOA/Living Will requested.    Medicare Attestation I have personally reviewed: The patient's medical and social history Their use of alcohol, tobacco or illicit drugs Their current medications and supplements The patient's functional ability including ADLs,fall risks, home safety risks, cognitive, and hearing and  visual impairment Diet and physical activities Evidence for depression   The patient's weight, height, BMI, and visual acuity have been recorded in the chart.  I have made referrals and provided education to the patient based on review of the above and I have provided the patient with  a written personalized care plan for preventive services.    During the course of the visit the patient was educated and counseled about the following appropriate screening and preventive services:   Vaccines to include Pneumoccal, Influenza, Hepatitis B, Td, Zostavax, HCV  Colorectal cancer screening-UTD  Glaucoma screening-annual eye exam  Nutrition counseling   Patient Instructions (the written plan) was given to the patient.    Varney Biles, LPN  0/09/457

## 2016-08-26 NOTE — Progress Notes (Signed)
Pre visit review using our clinic review tool, if applicable. No additional management support is needed unless otherwise documented below in the visit note. 

## 2016-08-26 NOTE — Progress Notes (Signed)
I have reviewed the above wellness visit and agree.  [name], M.D.

## 2016-08-26 NOTE — Assessment & Plan Note (Signed)
Seems to be doing quite well with this. No claudication. Does have palpable pulses. No evidence of skin breakdown or ulceration. He'll continue cholesterol control and continue to monitor.

## 2016-08-27 ENCOUNTER — Other Ambulatory Visit: Payer: Self-pay | Admitting: Unknown Physician Specialty

## 2016-08-27 NOTE — Progress Notes (Signed)
He is scheduled with Dr.Flemming on March 26 and he is aware of the appt.

## 2016-09-07 ENCOUNTER — Telehealth: Payer: Self-pay | Admitting: Family Medicine

## 2016-09-07 DIAGNOSIS — M791 Myalgia, unspecified site: Secondary | ICD-10-CM

## 2016-09-07 MED ORDER — CLONAZEPAM 2 MG PO TABS: 2.0000 mg | ORAL_TABLET | Freq: Three times a day (TID) | ORAL | 1 refills | Status: DC | PRN

## 2016-09-07 NOTE — Telephone Encounter (Signed)
I have reprinted rx per DrSonnenberg

## 2016-09-07 NOTE — Telephone Encounter (Signed)
Pt called looking to find out if we went his clonazePAM (KLONOPIN) 2 MG tablet to the pharmacy. It looks like it was sent to the wrong pharmacy. It needs to go to [place], [place] for a 90 day supply. He only has 5 pills left. Please advise, thank you!

## 2016-09-07 NOTE — Telephone Encounter (Signed)
[facility] notified [facility]

## 2016-10-28 ENCOUNTER — Telehealth (HOSPITAL_COMMUNITY): Payer: Self-pay | Admitting: *Deleted

## 2016-10-28 NOTE — Telephone Encounter (Signed)
CPX appointment information mailed to home address.

## 2016-11-09 ENCOUNTER — Ambulatory Visit (HOSPITAL_COMMUNITY): Payer: Medicare Other | Attending: Cardiology

## 2016-11-09 DIAGNOSIS — R0602 Shortness of breath: Secondary | ICD-10-CM | POA: Diagnosis not present

## 2016-11-10 ENCOUNTER — Telehealth (HOSPITAL_COMMUNITY): Payer: Self-pay | Admitting: Vascular Surgery

## 2016-11-10 ENCOUNTER — Other Ambulatory Visit (HOSPITAL_COMMUNITY): Payer: Self-pay | Admitting: *Deleted

## 2016-11-10 ENCOUNTER — Other Ambulatory Visit: Payer: Self-pay | Admitting: Family Medicine

## 2016-11-10 DIAGNOSIS — F4321 Adjustment disorder with depressed mood: Secondary | ICD-10-CM

## 2016-11-10 DIAGNOSIS — R0602 Shortness of breath: Secondary | ICD-10-CM

## 2016-11-10 MED ORDER — ESCITALOPRAM OXALATE 10 MG PO TABS: 10.0000 mg | ORAL_TABLET | Freq: Every day | ORAL | 3 refills | Status: DC

## 2016-11-10 NOTE — Telephone Encounter (Signed)
Called Dr. Gust Brooms office to get order for cpx

## 2016-11-10 NOTE — Telephone Encounter (Signed)
Pt wife called about pt needing a new Rx for escitalopram (LEXAPRO) 10 MG tablet in a generic form. The medication was in Dr Gilford Rile name.   Pharmacy is [facility], [place]  Call wife @ [phone]. Thank you!

## 2016-11-10 NOTE — Telephone Encounter (Signed)
Last OV 08/26/16 last filled 10/17/15 90 3rf

## 2016-11-11 ENCOUNTER — Other Ambulatory Visit: Payer: Self-pay

## 2016-11-11 DIAGNOSIS — M791 Myalgia, unspecified site: Secondary | ICD-10-CM

## 2016-11-11 MED ORDER — CLONAZEPAM 2 MG PO TABS: 2.0000 mg | ORAL_TABLET | Freq: Three times a day (TID) | ORAL | 1 refills | Status: DC | PRN

## 2016-11-11 NOTE — Telephone Encounter (Signed)
Last Ov 08/26/16 last filled 09/07/16 90 1rf

## 2016-11-12 ENCOUNTER — Telehealth: Payer: Self-pay | Admitting: Family Medicine

## 2016-11-12 MED ORDER — ESOMEPRAZOLE MAGNESIUM 40 MG PO CPDR: 40.0000 mg | DELAYED_RELEASE_CAPSULE | Freq: Every day | ORAL | 3 refills | Status: DC

## 2016-11-12 NOTE — Telephone Encounter (Signed)
Sent to pharmacy 

## 2016-11-12 NOTE — Telephone Encounter (Signed)
[facility] and [facility] at [facility], please send in nexium tablets for [facility]

## 2016-11-12 NOTE — Telephone Encounter (Signed)
Pt wife called stating that pt needs a refill on clonazePAM (KLONOPIN) 2 MG tablet and esomeprazole (NEXIUM) 40 MG packet PT WOULD LIKE TO SWITCH FROM THE POWDER PACKETS TO THE TABLETS  Please advise these need to be sent to [facility]

## 2016-11-12 NOTE — Telephone Encounter (Signed)
[facility] request

## 2016-11-14 DIAGNOSIS — R0600 Dyspnea, unspecified: Secondary | ICD-10-CM | POA: Diagnosis not present

## 2016-12-03 ENCOUNTER — Other Ambulatory Visit: Payer: Self-pay | Admitting: Specialist

## 2016-12-03 DIAGNOSIS — R0609 Other forms of dyspnea: Secondary | ICD-10-CM

## 2016-12-03 DIAGNOSIS — J449 Chronic obstructive pulmonary disease, unspecified: Secondary | ICD-10-CM

## 2016-12-10 ENCOUNTER — Ambulatory Visit
Admission: RE | Admit: 2016-12-10 | Discharge: 2016-12-10 | Disposition: A | Discharge status: Home / Self Care | Payer: Medicare Other | Source: Ambulatory Visit | Attending: Specialist | Admitting: Specialist

## 2016-12-10 DIAGNOSIS — J449 Chronic obstructive pulmonary disease, unspecified: Secondary | ICD-10-CM | POA: Diagnosis present

## 2016-12-10 DIAGNOSIS — R0609 Other forms of dyspnea: Secondary | ICD-10-CM | POA: Diagnosis present

## 2016-12-10 NOTE — Progress Notes (Signed)
*  PRELIMINARY RESULTS* Echocardiogram 2D Echocardiogram has been performed.  Timothy Turner 12/10/2016, 10:25 AM

## 2016-12-24 ENCOUNTER — Other Ambulatory Visit: Payer: Self-pay | Admitting: Specialist

## 2016-12-24 DIAGNOSIS — R0609 Other forms of dyspnea: Principal | ICD-10-CM

## 2016-12-24 DIAGNOSIS — J439 Emphysema, unspecified: Secondary | ICD-10-CM

## 2017-01-01 ENCOUNTER — Ambulatory Visit
Admission: RE | Admit: 2017-01-01 | Discharge: 2017-01-01 | Disposition: A | Discharge status: Home / Self Care | Payer: Medicare Other | Source: Ambulatory Visit | Attending: Specialist | Admitting: Specialist

## 2017-01-01 DIAGNOSIS — J439 Emphysema, unspecified: Secondary | ICD-10-CM | POA: Diagnosis not present

## 2017-01-01 DIAGNOSIS — R0609 Other forms of dyspnea: Secondary | ICD-10-CM | POA: Diagnosis present

## 2017-01-01 DIAGNOSIS — I700 Atherosclerosis of aorta: Secondary | ICD-10-CM | POA: Diagnosis not present

## 2017-01-18 ENCOUNTER — Other Ambulatory Visit: Payer: Self-pay

## 2017-01-18 DIAGNOSIS — M791 Myalgia, unspecified site: Secondary | ICD-10-CM

## 2017-01-18 MED ORDER — CLONAZEPAM 2 MG PO TABS: 2.0000 mg | ORAL_TABLET | Freq: Three times a day (TID) | ORAL | 1 refills | Status: DC | PRN

## 2017-01-18 NOTE — Telephone Encounter (Signed)
Last OV 08/26/16 last filled 11/11/16 90 1rf

## 2017-02-10 ENCOUNTER — Encounter: Payer: Self-pay | Admitting: Family Medicine

## 2017-02-10 ENCOUNTER — Ambulatory Visit (INDEPENDENT_AMBULATORY_CARE_PROVIDER_SITE_OTHER): Payer: Medicare Other | Admitting: Family Medicine

## 2017-02-10 DIAGNOSIS — F4321 Adjustment disorder with depressed mood: Secondary | ICD-10-CM

## 2017-02-10 DIAGNOSIS — J449 Chronic obstructive pulmonary disease, unspecified: Secondary | ICD-10-CM

## 2017-02-10 DIAGNOSIS — R1312 Dysphagia, oropharyngeal phase: Secondary | ICD-10-CM

## 2017-02-10 DIAGNOSIS — D075 Carcinoma in situ of prostate: Secondary | ICD-10-CM | POA: Diagnosis not present

## 2017-02-10 NOTE — Assessment & Plan Note (Signed)
Some depression. Continue current medication at this time. Continue to monitor.

## 2017-02-10 NOTE — Assessment & Plan Note (Signed)
Continues to have issues with this. He is maintaining his weight with blending his food. Discussed referral to [facility] for a third opinion given his continued issues with this though he declined this and opted to continue to monitor. We'll continue to monitor his weight and dietary intake.

## 2017-02-10 NOTE — Progress Notes (Signed)
Timothy Rumps, MD Phone: [phone]  Timothy Turner is a 76 y.o. male who presents today for follow-up.  COPD: Has been following with pulmonology. They placed him on Spiriva. He has continued breo. He notes neither has helped him at all. He gets short of breath with walking up and incline and he will stop and it improves. He notes no cough or wheezing. Not using his albuterol.   Dysphagia: This is unchanged. He is able to swallow liquids easily and blends his foods. He has seen ENT, GI, and neurology. States he was advised that the nerves were not telling the muscles to work correctly. Reports his weight has been stable. He is taking Nexium. Occasionally gets reflux. No blood in the stool. Weight appears to have trended up slightly over the past 2 years. On exam his face does appear slightly thinner than previously though he notes this is not significantly different for him. Notes bilateral temples are always slightly sunken in and pictures reviewed on his driver's license from several months ago revealing similar appearance. Driver's license from several years ago prior to weight loss did reveal a fuller appearing face.  Depression: Takes Lexapro. He feels depressed because he likes to do things but can't do them anymore. No anxiety. No SI or HI.  Follows with urology for his prostate. He had a nodule at some point and they did a biopsy and the patient states this wasn't cancer. He recently saw his urologist. That note was reviewed. They're going to continue testosterone and repeat PSAs.  PMH: Former smoker   ROS see history of present illness  Objective  Physical Exam Vitals:   02/10/17 0845  BP: 116/82  Pulse: (!) 59  Temp: 98.1 F (36.7 C)  SpO2: 95%    BP Readings from Last 3 Encounters:  02/10/17 116/82  08/26/16 120/64  08/19/16 132/78   Wt Readings from Last 3 Encounters:  02/10/17 176 lb (79.8 kg)  08/26/16 179 lb 1.9 oz (81.2 kg)  08/19/16 176 lb (79.8 kg)     Physical Exam  Constitutional: No distress.  HENT:  Head: Normocephalic and atraumatic.  Cardiovascular: Normal rate, regular rhythm and normal heart sounds.   Pulmonary/Chest: Effort normal and breath sounds normal.  Abdominal: Soft. Bowel sounds are normal. He exhibits no distension. There is no tenderness. There is no rebound and no guarding.  Musculoskeletal: He exhibits no edema.  Neurological: He is alert. Gait normal.  Skin: Skin is warm and dry. He is not diaphoretic.     Assessment/Plan: Please see individual problem list.  Dysphagia, oropharyngeal phase Continues to have issues with this. He is maintaining his weight with blending his food. Discussed referral to [facility] for a third opinion given his continued issues with this though he declined this and opted to continue to monitor. We'll continue to monitor his weight and dietary intake.  COPD (chronic obstructive pulmonary disease) (HCC) Stable. Continues to have issues with this. His inhalers have not been beneficial. He has an appointment with a new pulmonologist in the next several weeks and I discussed that he could try discontinuing the inhalers if he would like and see if there is any difference or he could continue them until he sees pulmonology again. If he has worsening breathing issues off of the inhalers he will restart them. Given return precautions.  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 It appears in their note there is mention of cancer in situ of the prostate though they appear  to be following PSAs as monitoring. He'll continue to follow with them.  Adjustment disorder with depressed mood Some depression. Continue current medication at this time. Continue to monitor.  Timothy Rumps, MD [place]

## 2017-02-10 NOTE — Assessment & Plan Note (Signed)
Stable. Continues to have issues with this. His inhalers have not been beneficial. He has an appointment with a new pulmonologist in the next several weeks and I discussed that he could try discontinuing the inhalers if he would like and see if there is any difference or he could continue them until he sees pulmonology again. If he has worsening breathing issues off of the inhalers he will restart them. Given return precautions.

## 2017-02-10 NOTE — Patient Instructions (Signed)
Nice to see you. Please see pulmonology as scheduled. If the inhalers are not beneficial if you could try discontinuing them and see if her breathing changes. If you develop worsening breathing or increased issues swallowing or any new or changing symptoms please seek medical attention.

## 2017-02-10 NOTE — Assessment & Plan Note (Signed)
Followed by urology. It appears in their note there is mention of cancer in situ of the prostate though they appear to be following PSAs as monitoring. He'll continue to follow with them.

## 2017-02-23 ENCOUNTER — Ambulatory Visit (INDEPENDENT_AMBULATORY_CARE_PROVIDER_SITE_OTHER): Payer: Medicare Other | Admitting: Internal Medicine

## 2017-02-23 ENCOUNTER — Encounter: Payer: Self-pay | Admitting: Internal Medicine

## 2017-02-23 VITALS — BP 112/64 | HR 64 | Ht 72.0 in | Wt 171.6 lb

## 2017-02-23 DIAGNOSIS — R0609 Other forms of dyspnea: Secondary | ICD-10-CM

## 2017-02-23 DIAGNOSIS — R911 Solitary pulmonary nodule: Secondary | ICD-10-CM | POA: Diagnosis not present

## 2017-02-23 NOTE — Progress Notes (Signed)
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     02/23/2017 1st [facility] office visit/ Emani Taussig   Chief Complaint  Patient presents with  . Pulmonary Consult    Self referral. Pt c/o SOB since March 2016. He states that he gets SOB. He states he gets winded with "doing anything except for sitting down"- but can walk normal pace on a flat surface "all day".    Doe x MMRC2 = can't walk a nl pace on a flat grade s sob but does fine slow and flat eg walking slow indoors at big stores  Onset was sudden assoc with choking on food and obvious reflux symptoms and since the onset March 2016 neither problem has changed for the better or worse on mulitple meds for gerd and copd   In terms of sob never have trouble at rest or problem with sleeping but freq noct sensation of GERD better with special bed to keep hob > 30 degrees / swallowing better if uses apple sauce consistency and turns neck a certain way   No obvious day to day or daytime variability or assoc excess/ purulent sputum or mucus plugs or hemoptysis or cp or chest tightness, subjective wheeze or overt sinus or hb symptoms. No unusual exp hx or h/o childhood pna/ asthma or knowledge of premature birth.  Sleeping ok without nocturnal  or early am exacerbation  of respiratory  c/o's or need for noct saba. Also denies any obvious fluctuation  of symptoms with weather or environmental changes or other aggravating or alleviating factors except as outlined above   Current Medications, Allergies, Complete Past Medical History, Past Surgical History, Family History, and Social History were reviewed in [employer] record.  ROS  The following are not active complaints unless bolded sore throat, dysphagia, dental problems, itching, sneezing,  nasal congestion or excess/ purulent secretions, ear ache,   fever, chills, sweats, unintended wt loss, classically pleuritic or exertional cp,  orthopnea pnd or leg swelling, presyncope, palpitations, abdominal pain, anorexia, nausea, vomiting, diarrhea  or change in bowel or bladder habits, change in stools or urine, dysuria,hematuria,  rash, arthralgias, visual complaints, headache, numbness, weakness or ataxia or problems with walking or coordination,  change in mood/affect or memory.             Review of Systems     Objective:   Physical Exam   Hoarse amb wm nad  Wt Readings from Last 3 Encounters:  02/23/17 171 lb 9.6 oz (77.8 kg)  02/10/17 176 lb (79.8 kg)  08/26/16 179 lb 1.9 oz (81.2 kg)    Vital signs reviewed  - - Note on arrival 02 sats  94% on RA      HEENT: nl  turbinates bilaterally,  and oropharynx. Nl external ear canals without cough reflex -upper and lower dentures   NECK :  without JVD/Nodes/TM/ nl carotid upstrokes bilaterally   LUNGS: no acc muscle use,  Nl contour chest with minimal inps and exp rhonchi bilaterally esp in bases posteriorly and L somewhat more prominent than R    CV:  RRR  no s3 or murmur or increase in P2, and no edema   ABD:  soft and nontender with nl inspiratory excursion in the supine position. No bruits or organomegaly appreciated, bowel sounds nl  MS:  Nl gait/ ext warm without deformities, calf tenderness, cyanosis or clubbing No obvious joint restrictions   SKIN: warm and dry without lesions    NEURO:  alert,  approp, nl sensorium with  no motor or cerebellar deficits apparent.      I personally reviewed images and agree with radiology impression as follows:   Chest CT  01/01/17 [place] of prior aspiration, left lower lobe predominant.  Mild subpleural nodularity measuring up to 3 mm. Non-contrast chest CT can be considered in 12 months in this high-risk patient.     Assessment:

## 2017-02-23 NOTE — Patient Instructions (Addendum)
GERD (REFLUX)  is an extremely common cause of respiratory symptoms just like yours , many times with no obvious heartburn at all.    It can be treated with medication, but also with lifestyle changes including elevation of the head of your bed (ideally with 6 inch  bed blocks),  Smoking cessation, avoidance of late meals, excessive alcohol, and avoid fatty foods, chocolate, peppermint, colas, red wine, and acidic juices such as orange juice.  NO MINT OR MENTHOL PRODUCTS SO NO COUGH DROPS   USE SUGARLESS CANDY INSTEAD (Jolley ranchers or Stover's or Life Savers) or even ice chips will also do - the key is to swallow to prevent all throat clearing. NO OIL BASED VITAMINS - use powdered substitutes  Continue Nexium 40 mg Take 30-60 min before last meal of the day   Fleischner society guidelines do not indicate a need for a directed ct for your 3 mm nodule   I will see if we can get you seen by Dr Nyoka Cowden who is a swallowing specialist    To get the most out of exercise, you need to be continuously aware that you are short of breath, but never out of breath, for 30 minutes daily. As you improve, it will actually be easier for you to do the same amount of exercise  in  30 minutes so always push to the level where you are short of breath.    No pulmonary follow up needed at this point

## 2017-02-24 DIAGNOSIS — R0609 Other forms of dyspnea: Principal | ICD-10-CM | POA: Insufficient documentation

## 2017-02-24 DIAGNOSIS — R911 Solitary pulmonary nodule: Secondary | ICD-10-CM | POA: Insufficient documentation

## 2017-02-24 NOTE — Assessment & Plan Note (Addendum)
See CT chest 01/01/17 x 3 mm > f/u yearly optional/ last smoked 2006   His risk of ca for this lesion is very low but not zero.  My concern is all the radiation he has already had and continues to have to sort out his GI problems   CT results reviewed with pt >>> way too small for PET or bx, not suspicious enough for excisional bx > really only option for now is follow the Fleischner society guidelines as rec by radiology > ok to do another CT  in one year but optional from my perspective given above concerns  Discussed in detail all the  indications, usual  risks and alternatives  relative to the benefits with patient who agrees to proceed with conservative f/u as outlined

## 2017-02-24 NOTE — Assessment & Plan Note (Addendum)
-  PFTs 03/27/15    FEV1 3.35 (97 % ) ratio 76  p 10 % improvement from saba p ? prior to study with DLCO  77 % corrects to 81 % for alv volume  With mild air trapping/ min curvature on f/v loop  -CPST 11/10/16 Exercise testing with gas exchange demonstrates mild functional impairment when compared to matched sedentary norms. There is no evidence for exercise-induced bronchospasm. There is no clear evidence for cardiovascular limitation. There was a mild hypertensive response to exercise in addition to mild chronotropic incompetence. - See CT chest c/w recurrent aspiration 01/01/17 and DgEs 03/27/16 c/w frank aspiration with ST maneuvers only partially effective and pt refusing PEG per last GI note here 05/29/16 Dr Loletha Carrow   I really could not reproduce any limiting sob here and don't see enough evidence of ILD from recurrent aspiration or copd from smoking to warrant any pulmonary intervention other than more regular submaximal exercise and  to focus on approp GI f/u and follow their recs.  I did give him my gerd diet restrictions and rec to use the ppi 30 min ac instead of with meals (before supper since most of his gerd is noct)    I looks like he's been seen by head of GI at [facility] and our GI doctor Product manager) and will ask Dr Nyoka Cowden if she is willing to see him but I am pessimistic because efforts to help him swallow better have resulted (from his perception) in worse overt GERD/ noct aspiration so even if he gets a PET the problem won't be entirely solved    Total time devoted to counseling  > 50 % of initial 60 min office visit:  review case/extensive records with pt/wife  discussion of options/alternatives/ personally creating written customized instructions  in presence of pt  then going over those specific  Instructions directly with the pt including how to use all of the meds but in particular covering each new medication in detail and the difference between the maintenance= "automatic" meds and the prns  using an action plan format for the latter (If this problem/symptom => do that organization reading Left to right).  Please see AVS from this visit for a full list of these instructions which I personally wrote for this pt and  are unique to this visit.

## 2017-02-26 ENCOUNTER — Ambulatory Visit: Payer: Medicare Other | Admitting: Family Medicine

## 2017-02-26 ENCOUNTER — Telehealth: Payer: Self-pay | Admitting: *Deleted

## 2017-02-26 NOTE — Telephone Encounter (Signed)
-----   Message from Tanda Rockers, MD sent at 02/25/2017  5:17 PM EDT ----- Let pt know we had his record reviewed by our best GI doc and nothing to offer him here so rec he return to [facility] or let his pcp or neurologist  refer to GI at [facility],  I reviewed the chart, appears patient has predominantly oropharyngeal dysphagia/ aspiration with thin liquids. Unfortunately I will not be able to help in this scenario. His esophageal motility appeared relatively normal. He may benefit from speech and swallow therapy referral for PT. I agree PEG is not going to prevent aspiration episodes. Thank you Hughes Better

## 2017-02-26 NOTE — Telephone Encounter (Signed)
Spoke with the pt and notified of recs per MW  He verbalized understanding  Nothing further needed  

## 2017-03-18 ENCOUNTER — Other Ambulatory Visit: Payer: Self-pay

## 2017-03-18 DIAGNOSIS — M791 Myalgia, unspecified site: Secondary | ICD-10-CM

## 2017-03-18 MED ORDER — CLONAZEPAM 2 MG PO TABS: 2.0000 mg | ORAL_TABLET | Freq: Three times a day (TID) | ORAL | 1 refills | Status: DC | PRN

## 2017-03-18 NOTE — Telephone Encounter (Signed)
Last OV 02/10/17 last filled 01/18/17 90 1rf

## 2017-03-18 NOTE — Telephone Encounter (Signed)
Refill given. Please place on my desk to sign. Thanks. 

## 2017-03-19 NOTE — Telephone Encounter (Signed)
faxed

## 2017-03-31 ENCOUNTER — Other Ambulatory Visit: Payer: Self-pay | Admitting: Family Medicine

## 2017-04-27 ENCOUNTER — Ambulatory Visit: Payer: Self-pay | Admitting: *Deleted

## 2017-04-27 NOTE — Telephone Encounter (Signed)
Reason for Disposition . Diarrhea is a chronic symptom (recurrent or ongoing AND present > 4 weeks)  Answer Assessment - Initial Assessment Questions 1. COLOR: "What color is it?" "Is that color in part or all of the stool?"      Whitish   Stool   With  Grayish    2. ONSET: "When was the unusual color first noted?"       Pt  States   Happened    About  5 years  Ago     3. CAUSE: "Have you eaten any food or taken any medicine of this color?" (See listing in BACKGROUND)       No  New  Medications     4. OTHER SYMPTOMS: "Do you have any other symptoms?" (e.g., diarrhea, jaundice, abdominal pain, fever).        Acid  Reflux   Recent  Dietary  Changes      No   Great    No  Jaundice               Pt  Reports   Some  Itching   At  Times    No  abd   Swelling  Answer Assessment - Initial Assessment Questions 1. DIARRHEA SEVERITY: "How bad is the diarrhea?" "How many extra stools have you had in the past 24 hours than normal?"    - MILD: Few loose or mushy BMs; increase of 1-3 stools over normal daily number of stools; mild increase in ostomy output.   - MODERATE: Increase of 4-6 stools daily over normal; moderate increase in ostomy output.   - SEVERE (or Worst Possible): Increase of 7 or more stools daily over normal; moderate increase in ostomy output; incontinence.     mild 2. ONSET: "When did the diarrhea begin?"       5  Years   3. BM CONSISTENCY: "How loose or watery is the diarrhea?"       intermittant  4. VOMITING: "Are you also vomiting?" If so, ask: "How many times in the past 24 hours?"       no 5. ABDOMINAL PAIN: "Are you having any abdominal pain?" If yes: "What does it feel like?" (e.g., crampy, dull, intermittent, constant)       no 6. ABDOMINAL PAIN SEVERITY: If present, ask: "How bad is the pain?"  (e.g., Scale 1-10; mild, moderate, or severe)    - MILD (1-3): doesn't interfere with normal activities, abdomen soft and not tender to touch     - MODERATE (4-7): interferes with  normal activities or awakens from sleep, tender to touch     - SEVERE (8-10): excruciating pain, doubled over, unable to do any normal activities        mild 7. ORAL INTAKE: If vomiting, "Have you been able to drink liquids?" "How much fluids have you had in the past 24 hours?"      yes 8. HYDRATION: "Any signs of dehydration?" (e.g., dry mouth [not just dry lips], too weak to stand, dizziness, new weight loss) "When did you last urinate?"     6- 8  Months  9. EXPOSURE: "Have you traveled to a foreign country recently?" "Have you been exposed to anyone with diarrhea?" "Could you have eaten any food that was spoiled?"    no 10. OTHER SYMPTOMS: "Do you have any other symptoms?" (e.g., fever, blood in stool)      White  Stool   Some  reflux 11. PREGNANCY: "Is  there any chance you are pregnant?" "When was your last menstrual period?"       no  Protocols used: DIARRHEA-A-AH, [place]

## 2017-04-30 ENCOUNTER — Ambulatory Visit (INDEPENDENT_AMBULATORY_CARE_PROVIDER_SITE_OTHER): Payer: Medicare Other | Admitting: Family Medicine

## 2017-04-30 ENCOUNTER — Encounter: Payer: Self-pay | Admitting: Family Medicine

## 2017-04-30 VITALS — BP 122/68 | HR 56 | Temp 97.5°F | Wt 168.8 lb

## 2017-04-30 DIAGNOSIS — Z23 Encounter for immunization: Secondary | ICD-10-CM

## 2017-04-30 DIAGNOSIS — J309 Allergic rhinitis, unspecified: Secondary | ICD-10-CM | POA: Diagnosis not present

## 2017-04-30 DIAGNOSIS — R0609 Other forms of dyspnea: Secondary | ICD-10-CM | POA: Diagnosis not present

## 2017-04-30 DIAGNOSIS — R5383 Other fatigue: Secondary | ICD-10-CM

## 2017-04-30 DIAGNOSIS — R195 Other fecal abnormalities: Secondary | ICD-10-CM | POA: Diagnosis not present

## 2017-04-30 DIAGNOSIS — R1312 Dysphagia, oropharyngeal phase: Secondary | ICD-10-CM | POA: Diagnosis not present

## 2017-04-30 LAB — COMPREHENSIVE METABOLIC PANEL
ALT: 23 U/L (ref 0–53)
AST: 24 U/L (ref 0–37)
Albumin: 4 g/dL (ref 3.5–5.2)
Alkaline Phosphatase: 72 U/L (ref 39–117)
BILIRUBIN TOTAL: 0.6 mg/dL (ref 0.2–1.2)
BUN: 11 mg/dL (ref 6–23)
CALCIUM: 10 mg/dL (ref 8.4–10.5)
CO2: 32 mEq/L (ref 19–32)
CREATININE: 1.16 mg/dL (ref 0.40–1.50)
Chloride: 102 mEq/L (ref 96–112)
GFR: 65.41 mL/min (ref >60.00)
Glucose, Bld: 109 mg/dL — ABNORMAL HIGH (ref 70–99)
Potassium: 4.3 mEq/L (ref 3.5–5.1)
Sodium: 141 mEq/L (ref 135–145)
Total Protein: 6.6 g/dL (ref 6.0–8.3)

## 2017-04-30 LAB — CBC WITH DIFFERENTIAL/PLATELET
BASOS ABS: 0.1 10*3/uL (ref 0.0–0.1)
Basophils Relative: 1.2 % (ref 0.0–3.0)
EOS ABS: 0.2 10*3/uL (ref 0.0–0.7)
Eosinophils Relative: 3.5 % (ref 0.0–5.0)
HEMATOCRIT: 47 % (ref 39.0–52.0)
HEMOGLOBIN: 15.4 g/dL (ref 13.0–17.0)
LYMPHS PCT: 32 % (ref 12.0–46.0)
Lymphs Abs: 2.2 10*3/uL (ref 0.7–4.0)
MCHC: 32.8 g/dL (ref 30.0–36.0)
MCV: 90.3 fl (ref 78.0–100.0)
Monocytes Absolute: 0.7 10*3/uL (ref 0.1–1.0)
Monocytes Relative: 10.9 % (ref 3.0–12.0)
NEUTROS ABS: 3.5 10*3/uL (ref 1.4–7.7)
Neutrophils Relative %: 52.4 % (ref 43.0–77.0)
PLATELETS: 236 10*3/uL (ref 150.0–400.0)
RBC: 5.21 Mil/uL (ref 4.22–5.81)
RDW: 13.9 % (ref 11.5–15.5)
WBC: 6.7 10*3/uL (ref 4.0–10.5)

## 2017-04-30 LAB — VITAMIN B12: Vitamin B-12: 832 pg/mL (ref 211–911)

## 2017-04-30 LAB — VITAMIN D 25 HYDROXY (VIT D DEFICIENCY, FRACTURES): VITD: 67.71 ng/mL (ref 30.00–100.00)

## 2017-04-30 NOTE — Progress Notes (Signed)
Subjective:    Patient ID: Timothy Turner, male    DOB: 1940/07/31, 76 y.o.   MRN: [id number]  HPI This is a 76 yo male who presents today with stool color change x 4 years. Has light colored, oily stools. Has a lot of flatulence. Occasionally has stool with flatulence. Stools float in toilet. No blood in stool or on toilet paper. No abdominal pain.   He is also having clear sinus drainage, this is problematic because of his swallowing difficulties (has to eat a pureed diet). He wakes up at night and has to clear his throat and "hock up," his drainage. No cough or SOB. Tried flonase briefly, finds Mucinex makes him drain more.   Feels fatigued. This has been going on for a long time. Not sleeping well with drainage.   Past Medical History:  Diagnosis Date  . Aspiration precautions    uses thicken in liquids.  Eats pureed food.  . Barrett esophagus   . Depression   . Gallstones   . GERD (gastroesophageal reflux disease)   . Heart murmur    History of  . Hemorrhoids   . Hypertension   . Hypothyroidism   . Mitral valve disease   . Myalgia    Multiple  . Neuromuscular disorder ([place])   . PAD (peripheral artery disease) ([facility])    lower legs  . Pain syndrome, chronic    Severe  . Pneumonia    three times, last time >10 years ago  . Psoriasis   . Rheumatic heart valve incompetence   . Shortness of breath dyspnea   . Thyroiditis    Past Surgical History:  Procedure Laterality Date  . BACK SURGERY    . CHOLECYSTECTOMY  08/03/13  . COLONOSCOPY  03-08-12   Dr Donnella Sham  . HEMORROIDECTOMY    . UPPER GI ENDOSCOPY  03-06-2013   Dr Donnella Sham   Family History  Problem Relation Age of Onset  . Heart failure Mother   . Heart failure Father   . Emphysema Father        smoked  . Cancer Sister   . Aneurysm Sister        Brain   Social History   Tobacco Use  . Smoking status: Former Smoker    Packs/day: 1.00    Years: 30.00    Pack years: 30.00    Types: Cigarettes    Last  attempt to quit: 06/22/2004    Years since quitting: 12.8  . Smokeless tobacco: Never Used  Substance Use Topics  . Alcohol use: No    Alcohol/week: 0.0 oz  . Drug use: No      Review of Systems Per HPI    Objective:   Physical Exam  Constitutional: He is oriented to person, place, and time. He appears well-developed and well-nourished. No distress.  HENT:  Head: Normocephalic and atraumatic.  Right Ear: Tympanic membrane, external ear and ear canal normal.  Left Ear: Tympanic membrane, external ear and ear canal normal.  Nose: Mucosal edema and rhinorrhea present.  Mouth/Throat: Uvula is midline and oropharynx is clear and moist.  Cardiovascular: Normal rate, regular rhythm and normal heart sounds.  Pulmonary/Chest: Effort normal and breath sounds normal.  Neurological: He is alert and oriented to person, place, and time.  Skin: Skin is warm and dry. He is not diaphoretic.  Psychiatric: He has a normal mood and affect. His behavior is normal. Judgment and thought content normal.  Vitals reviewed.  BP 122/68 (BP Location: Left Arm, Patient Position: Sitting, Cuff Size: Normal)   Pulse (!) 56   Temp (!) 97.5 F (36.4 C) (Oral)   Wt 168 lb 12 oz (76.5 kg)   SpO2 96%   BMI 22.89 kg/m  Wt Readings from Last 3 Encounters:  04/30/17 168 lb 12 oz (76.5 kg)  02/23/17 171 lb 9.6 oz (77.8 kg)  02/10/17 176 lb (79.8 kg)    Assessment & Plan:  1. Change in stool - this is a longstanding problems that seems to have started after his cholecystectomy.  - CBC with Differential/Platelet - Comprehensive metabolic panel - Vitamin U86  2. Allergic rhinitis, unspecified seasonality, unspecified trigger -  Patient Instructions  For your nasal drainage, use your Flonase daily and take an over the counter cetirizine (generic Zyrtec)  3. DOE (dyspnea on exertion) - CBC with Differential/Platelet - Comprehensive metabolic panel - Vitamin Y84 - VITAMIN D 25 Hydroxy (Vit-D  Deficiency, Fractures)  4. Oropharyngeal dysphagia - continue pureed foods  5. Fatigue, unspecified type - Vitamin B12 - VITAMIN D 25 Hydroxy (Vit-D Deficiency, Fractures)  6. Need for influenza vaccination - Flu vaccine HIGH DOSE PF   Clarene Reamer, FNP-BC  [facility], [facility]  04/30/2017 6:46 PM

## 2017-04-30 NOTE — Patient Instructions (Signed)
For your nasal drainage, use your Flonase daily and take an over the counter cetirizine (generic Zyrtec)  I will notify you of your lab results next week

## 2017-05-04 ENCOUNTER — Other Ambulatory Visit: Payer: Self-pay | Admitting: Family Medicine

## 2017-05-05 ENCOUNTER — Telehealth: Payer: Self-pay

## 2017-05-05 NOTE — Telephone Encounter (Signed)
I sent him a message via mychart.

## 2017-05-05 NOTE — Telephone Encounter (Signed)
Copied from [place] [id number]. Topic: Quick Communication - Lab Results >> May 05, 2017  1:59 PM Lennox Solders wrote: Pt would like blood work from 04-30-17 to be release on [employer]

## 2017-05-11 ENCOUNTER — Encounter: Payer: Self-pay | Admitting: Internal Medicine

## 2017-05-11 ENCOUNTER — Ambulatory Visit (INDEPENDENT_AMBULATORY_CARE_PROVIDER_SITE_OTHER): Payer: Medicare Other | Admitting: Internal Medicine

## 2017-05-11 DIAGNOSIS — R195 Other fecal abnormalities: Secondary | ICD-10-CM

## 2017-05-11 DIAGNOSIS — R634 Abnormal weight loss: Secondary | ICD-10-CM

## 2017-05-11 DIAGNOSIS — J449 Chronic obstructive pulmonary disease, unspecified: Secondary | ICD-10-CM | POA: Diagnosis not present

## 2017-05-11 DIAGNOSIS — R140 Abdominal distension (gaseous): Secondary | ICD-10-CM

## 2017-05-11 NOTE — Patient Instructions (Signed)
Clay colored stools can be due to a problem with your liver or your pancreas.  Your liver enzymes were normal 2 weeks ago, so we are not repeating them at this time since your stools have been abnormal for nearly 4 years.   I am ordering a  CT scan of your liver and this will include your pancreas and all of your other organs

## 2017-05-11 NOTE — Progress Notes (Signed)
Subjective:  Patient ID: Timothy Turner, male    DOB: 1941/04/08  Age: 76 y.o. MRN: [id number]  CC: Diagnoses of Abdominal distension (gaseous), Abnormal weight loss, Clay-colored stools, and Chronic obstructive pulmonary disease, unspecified COPD type ([place]) were pertinent to this visit.  HPI Timothy Turner presents for presents for evaluation of clay colored stools. Patient states that his stools have been clay colored for  The past 3-4 years,  ever since his cholecystectomy om February 2015. , but he recently mentioned it to a doctor he saw at Urgent Care and was told to follow up with his PCP immediately. His last visit with his PCP was less than 3 months ago.Timothy Turner  He has been reading about clay colored stools on the internet and is ver concerned that he has an problem with his liver or pancreas. He has no history of nausea, epigastric pain or diarrhea.  Stools have been solid but "oily" in appearance.  He has not mentioned the appearance of his stools to his PCP  .  Liver enzymes have been repeatedly normal. .After considerable discussion, during which time it is clear that he has profound mistrust of most doctors , a CT of abd and pelvis with contrast was ordered for further evaluation.   Greater than half of our 40 minute visit was spent discussing his concerns about his  persistent dyspnea .  He has a2 year history of sudden onset of dyspnea in the setting of  Former tobacco abuse , chronic dysphagia with recurrent aspiration in the setting of  Short segment Barrett's esophagus noted  In 2014, persistent by 2016 EGD by [facility].     States that virtually overnight back in 2016 he became unable to walk his usual daily distance of six  [place] without becoming very short of breath.  Also notes that he loses his breath when he bends over or squats. He was recently seen by Dr Melvyn Novas who told him that "everything was fine with his lungs" despite findings and PFTS by Dr. Raul Del in 2017 consistent  with  moderate COPD,  Which he refuses to accept   His workup thus far   April 2016 : TTE:  EF 60 to 65%  Moderate MR.  Normal pulmonary pressure July 2016: swallow eval and esophageal manometry : normal motility,  Hypotensive LES resting pressure,  Hiatal  hernia . 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 Dec 2016: NCV with EMG: normal except for "pinched nerve" right leg    April 2017 : persistent dysphagia ,  Weight loss,  Pureed diet and  use of nectar thickened liquids to prevent aspiration.  Referred to [facility] for second opinion, referred to speech pathology by Dr Irine Seal .   May 2017: ER Visit for dysphagia with bloody material coughed up  June 2017: EGD repeated due to reports of persistent  hematemesis:  Normal except for hiatal hernia  October 2017:  barium swallow: prominent aspiration of barium    June 2018:  Repeat TTE with bubble study Raul Del) : normal  July 2018 : non  contrasted chest CT Raul Del) : centrilobular  And paraseptal emphysematous changes, with scattering opacities in LLL,  Right upper middle and lower lobes c/w sequelae of prior aspiration     He has had a weight loss of 14 lbs inc the last 4 years due to his change in diet due to dysphagia .  BMI is 23    Outpatient Medications Prior to Visit  Medication Sig Dispense Refill  . celecoxib (CELEBREX) 200 MG capsule Take 1 capsule (200 mg total) by mouth 2 (two) times daily. 180 capsule 1  . Cholecalciferol (VITAMIN D-3 PO) Take 1,000 Units by mouth 2 (two) times daily.     . clobetasol  (TEMOVATE) 0.05 % cream Apply topically as needed. Reported on 12/03/2015    . clonazePAM (KLONOPIN) 2 MG tablet Take 1 tablet (2 mg total) by mouth 3 (three) times daily as needed (severe muscle spasm). 90 tablet 1  . colesevelam (WELCHOL) 625 MG tablet TAKE 3 TABLETS TWICE DAILY WITH MEALS (NEED TO CONTACT OFFICE TO SCHEDULE FUTURE APPOINTMENT AND REFILLS) (Patient taking differently: TAKE 3 TABLETS BY MOUTH IN THE EVENING) 540 tablet 3  . colestipol (COLESTID) 1 G tablet Take 3 tablets (3 g total) by mouth 2 (two) times daily. 540 tablet 3  . escitalopram (LEXAPRO) 10 MG tablet Take 1 tablet (10 mg total) by mouth daily. 90 tablet 3  . esomeprazole (NEXIUM) 40 MG capsule Take 1 capsule (40 mg total) by mouth daily. 90 capsule 3  . ezetimibe (ZETIA) 10 MG tablet TAKE 1 TABLET DAILY 90 tablet 1  . finasteride (PROSCAR) 5 MG tablet Take 5 mg by mouth daily.      . fluticasone (FLONASE) 50 MCG/ACT nasal spray Place into both nostrils daily as needed for allergies or rhinitis.    Timothy Turner levothyroxine (SYNTHROID, LEVOTHROID) 75 MCG tablet TAKE 1 TABLET DAILY 90 tablet 1  . Multiple Vitamins-Minerals (CENTRUM SILVER PO) Take 1 tablet by mouth every evening.     . Red Yeast Rice Extract (RED YEAST RICE PO) Take 1 capsule by mouth 2 (two) times daily.    . Testosterone (AXIRON) 30 MG/ACT SOLN Apply 1 pump under each armpit every night     No facility-administered medications prior to visit.     Review of Systems;  Patient denies headache, fevers, malaise, unintentional weight loss, skin rash, eye pain, sinus congestion and sinus pain, sore throat, dysphagia,  hemoptysis , cough, dyspnea, wheezing, chest pain, palpitations, orthopnea, edema, abdominal pain, nausea, melena, diarrhea, constipation, flank pain, dysuria, hematuria, urinary  Frequency, nocturia, numbness, tingling, seizures,  Focal weakness, Loss of consciousness,  Tremor, insomnia, depression, anxiety, and suicidal ideation.      Objective:    BP 124/76 (BP Location: Left Arm, Patient Position: Sitting, Cuff Size: Normal)   Pulse 64   Temp 97.8 F (36.6 C) (Oral)   Resp 15   Ht 6' (1.829 m)   Wt 170 lb 9.6 oz (77.4 kg)   SpO2 95%   BMI 23.14 kg/m   BP Readings from Last 3 Encounters:  05/11/17 124/76  04/30/17 122/68  02/23/17 112/64    Wt Readings from Last 3 Encounters:  05/11/17 170 lb 9.6 oz (77.4 kg)  04/30/17 168 lb 12 oz (76.5 kg)  02/23/17 171 lb 9.6 oz (77.8 kg)    General appearance: alert, cooperative and appears stated age Ears: normal TM's and external ear canals both ears Throat: lips, mucosa, and tongue normal; teeth and gums normal Neck: no adenopathy, no  carotid bruit, supple, symmetrical, trachea midline and thyroid not enlarged, symmetric, no tenderness/mass/nodules Back: symmetric, no curvature. ROM normal. No CVA tenderness. Lungs: clear to auscultation bilaterally Heart: regular rate and rhythm, S1, S2 normal, no murmur, click, rub or gallop Abdomen: soft, non-tender; bowel sounds normal; no masses,  no organomegaly Pulses: 2+ and symmetric Skin: Skin color, texture, turgor normal. No rashes or lesions Lymph nodes: Cervical, supraclavicular, and axillary nodes normal.  No results found for: HGBA1C  Lab Results  Component Value Date   CREATININE 1.16 04/30/2017   CREATININE 1.17 03/25/2016   CREATININE 1.33 (H) 11/07/2015    Lab Results  Component Value Date   WBC 6.7 04/30/2017   HGB 15.4 04/30/2017   HCT 47.0 04/30/2017   PLT 236.0 04/30/2017   GLUCOSE 109 (H) 04/30/2017   CHOL 181 11/12/2014   TRIG 113.0 11/12/2014   HDL 35.20 (L) 11/12/2014   LDLCALC 123 (H) 11/12/2014   ALT 23 04/30/2017   AST 24 04/30/2017   NA 141 04/30/2017   K 4.3 04/30/2017   CL 102 04/30/2017   CREATININE 1.16 04/30/2017   BUN 11 04/30/2017   CO2 32 04/30/2017   TSH 3.97 08/26/2016   INR 1.01 02/21/2015   MICROALBUR 1.0 11/12/2014    Ct Chest Wo Contrast  Result Date:  01/01/2017 CLINICAL DATA:  Chronic dyspnea on exertion x2 years EXAM: CT CHEST WITHOUT CONTRAST TECHNIQUE: Multidetector CT imaging of the chest was performed following the standard protocol without IV contrast. COMPARISON:  Chest radiographs dated 11/07/2015 FINDINGS: Cardiovascular: The heart is normal in size. No pericardial effusion. Coronary atherosclerosis of the LAD and left circumflex. No evidence of thoracic aortic aneurysm. Atherosclerotic calcifications of the aortic root/ valve and aortic arch. Mediastinum/Nodes: Small mediastinal lymph nodes which do not meet pathologic CT size criteria. Visualized right thyroid is notable for subcentimeter nodules. Lungs/Pleura: Mild centrilobular and paraseptal emphysematous changes, upper lobe predominant. Branching high density nodular opacities in the left lower lobe, favoring sequela of prior aspiration. Additional high density nodularity in the right upper, middle and lower lobes, also related to prior aspiration. Mild subpleural nodularity in the left upper lobe and right upper lobe which is not hyperdense, but measures 2-3 mm (for example, series 3/images 39 and 40). This is unlikely to be clinically significant. No focal consolidation. No pleural effusion or pneumothorax. Upper Abdomen: Moderate hepatic steatosis. Musculoskeletal: Degenerative changes of the visualized thoracolumbar spine. IMPRESSION: Sequela of prior aspiration, left lower lobe predominant. Mild subpleural nodularity measuring up to 3 mm. Non-contrast chest CT can be considered in 12 months in this high-risk patient. This recommendation follows the consensus statement: Guidelines for Management of Incidental Pulmonary Nodules Detected on CT Images: From the Fleischner Society 2017; Radiology 2017; 284:228-243. No evidence of acute cardiopulmonary disease. Aortic Atherosclerosis (ICD10-I70.0) and Emphysema (ICD10-J43.9). Electronically Signed   By: Julian Hy M.D.   On: 01/01/2017 10:19     Assessment & Plan:   Problem List Items Addressed This Visit    Clay-colored stools    Apparently chronic since cholecsytectomy in 2015.  Ct abd and pelvis noted a benign appearing mass in liver in 2016,  However,  LFTS were normal less than 3 months ago..  Repeat CT ordered  To rule out hepatic and pancreatic mass       COPD (chronic obstructive pulmonary disease) ([place])    He continues to have symptoms of dyspnea on exertion that limit his ability to exercise and are the cause of significant emotional  distress.  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 Melvyn Novas told him his "lungs were fine."  A total of 40 minutes was spent with patient more than half of which was spent in counseling patient on the above mentioned issues , reviewing and explaining recent labs and imaging studies done, and coordination of care.  He is mistrustful of the medical establishment in general and appears to be quite dissatisfied with the inability of his physicians to explain why he is so short of breath.  Advised him to follow up with Dr Melvyn Novas for clarification of the degree of lung disease        Other Visit Diagnoses    Abdominal distension (gaseous)       Relevant Orders   CT Abdomen Pelvis W Contrast   Abnormal weight loss       Relevant Orders   CT Abdomen Pelvis W Contrast      I am having Pardeep F. Tant "RANDY" maintain his Multiple Vitamins-Minerals (CENTRUM SILVER PO), clobetasol cream, finasteride, colesevelam, colestipol, celecoxib, Testosterone, Cholecalciferol (VITAMIN D-3 PO), Red Yeast Rice Extract (RED YEAST RICE PO), fluticasone, escitalopram, esomeprazole, clonazePAM, ezetimibe, and levothyroxine.  No orders of the defined types were placed in this encounter.   There are no discontinued medications.  Follow-up: No Follow-up on file.   Crecencio Mc, MD

## 2017-05-13 DIAGNOSIS — R195 Other fecal abnormalities: Secondary | ICD-10-CM | POA: Insufficient documentation

## 2017-05-13 NOTE — Assessment & Plan Note (Addendum)
Apparently chronic since cholecsytectomy in 2015.  Ct abd and pelvis noted a benign appearing mass in liver in 2016,  However,  LFTS were normal less than 3 months ago..  Repeat CT ordered  To rule out hepatic and pancreatic mass

## 2017-05-13 NOTE — Assessment & Plan Note (Signed)
He continues to have symptoms of dyspnea on exertion that limit his ability to exercise and are the cause of significant emotional distress.  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 Melvyn Novas told him his "lungs were fine."  A total of 40 minutes was spent with patient more than half of which was spent in counseling patient on the above mentioned issues , reviewing and explaining recent labs and imaging studies done, and coordination of care.  He is mistrustful of the medical establishment in general and appears to be quite dissatisfied with the inability of his physicians to explain why he is so short of breath.  Advised him to follow up with Dr Melvyn Novas for clarification of the degree of lung disease

## 2017-05-20 ENCOUNTER — Ambulatory Visit
Admission: RE | Admit: 2017-05-20 | Discharge: 2017-05-20 | Disposition: A | Discharge status: Home / Self Care | Payer: Medicare Other | Source: Ambulatory Visit | Attending: Internal Medicine | Admitting: Internal Medicine

## 2017-05-20 DIAGNOSIS — R918 Other nonspecific abnormal finding of lung field: Secondary | ICD-10-CM | POA: Insufficient documentation

## 2017-05-20 DIAGNOSIS — I700 Atherosclerosis of aorta: Secondary | ICD-10-CM | POA: Insufficient documentation

## 2017-05-20 DIAGNOSIS — R932 Abnormal findings on diagnostic imaging of liver and biliary tract: Secondary | ICD-10-CM | POA: Diagnosis not present

## 2017-05-20 DIAGNOSIS — Z6823 Body mass index (BMI) 23.0-23.9, adult: Secondary | ICD-10-CM | POA: Diagnosis not present

## 2017-05-20 DIAGNOSIS — R634 Abnormal weight loss: Secondary | ICD-10-CM | POA: Insufficient documentation

## 2017-05-20 DIAGNOSIS — R140 Abdominal distension (gaseous): Secondary | ICD-10-CM | POA: Insufficient documentation

## 2017-05-20 DIAGNOSIS — K5730 Diverticulosis of large intestine without perforation or abscess without bleeding: Secondary | ICD-10-CM | POA: Insufficient documentation

## 2017-05-20 MED ORDER — IOPAMIDOL (ISOVUE-300) INJECTION 61%
100.0000 mL | Freq: Once | INTRAVENOUS | Status: AC | PRN
  Administered 2017-05-20: 100 mL via INTRAVENOUS

## 2017-05-21 ENCOUNTER — Encounter: Payer: Self-pay | Admitting: Internal Medicine

## 2017-05-21 NOTE — Telephone Encounter (Signed)
Patient called and would like to be seen sooner has lots of concern about scan.Appt with PCP not until 06/02/17

## 2017-05-21 NOTE — Telephone Encounter (Signed)
Copied from [place]. Topic: Appointment Scheduling - Scheduling Inquiry for Clinic >> May 21, 2017 12:15 PM Boyd Kerbs wrote: Reason for CRM:  Patient had CT scan and got message on my chart from Dr. Derrel Nip to come in and discuss findings with her or primary Dr. Caryl Bis. Has appt on 12/12 with Dr. Caryl Bis, but is anxious and would like to know before then.

## 2017-05-22 NOTE — Telephone Encounter (Signed)
Imaging revealed chronic lung changes possibly related to aspiration.  Also a benign lung nodule.  He possibly has fatty liver.  He has several small benign renal cysts.  His prostate is prominent and has several small calcifications.  He appeared to be constipated.  He has some atherosclerosis in his abdominal aorta.  We can discuss these things further at follow-up.  Thanks.

## 2017-05-27 ENCOUNTER — Other Ambulatory Visit: Payer: Self-pay

## 2017-05-27 DIAGNOSIS — M791 Myalgia, unspecified site: Secondary | ICD-10-CM

## 2017-05-27 MED ORDER — CLONAZEPAM 2 MG PO TABS: 2.0000 mg | ORAL_TABLET | Freq: Three times a day (TID) | ORAL | 1 refills | Status: DC | PRN

## 2017-05-27 NOTE — Telephone Encounter (Signed)
Last OV 02/10/17 last filled 03/18/17 90 1rf

## 2017-05-27 NOTE — Telephone Encounter (Signed)
faxed

## 2017-06-02 ENCOUNTER — Encounter: Payer: Self-pay | Admitting: Family Medicine

## 2017-06-02 ENCOUNTER — Ambulatory Visit (INDEPENDENT_AMBULATORY_CARE_PROVIDER_SITE_OTHER): Payer: Medicare Other | Admitting: Family Medicine

## 2017-06-02 ENCOUNTER — Other Ambulatory Visit: Payer: Self-pay

## 2017-06-02 ENCOUNTER — Ambulatory Visit: Payer: Medicare Other | Admitting: Family Medicine

## 2017-06-02 VITALS — BP 120/82 | HR 61 | Temp 98.0°F | Wt 165.8 lb

## 2017-06-02 DIAGNOSIS — K130 Diseases of lips: Secondary | ICD-10-CM | POA: Diagnosis not present

## 2017-06-02 DIAGNOSIS — E039 Hypothyroidism, unspecified: Secondary | ICD-10-CM

## 2017-06-02 DIAGNOSIS — R1312 Dysphagia, oropharyngeal phase: Secondary | ICD-10-CM

## 2017-06-02 DIAGNOSIS — D075 Carcinoma in situ of prostate: Secondary | ICD-10-CM | POA: Diagnosis not present

## 2017-06-02 DIAGNOSIS — R195 Other fecal abnormalities: Secondary | ICD-10-CM | POA: Diagnosis not present

## 2017-06-02 DIAGNOSIS — R1013 Epigastric pain: Secondary | ICD-10-CM | POA: Diagnosis not present

## 2017-06-02 LAB — COMPREHENSIVE METABOLIC PANEL
ALBUMIN: 4.3 g/dL (ref 3.5–5.2)
ALK PHOS: 80 U/L (ref 39–117)
ALT: 34 U/L (ref 0–53)
AST: 32 U/L (ref 0–37)
BILIRUBIN TOTAL: 0.6 mg/dL (ref 0.2–1.2)
BUN: 11 mg/dL (ref 6–23)
CALCIUM: 9.6 mg/dL (ref 8.4–10.5)
CO2: 30 meq/L (ref 19–32)
CREATININE: 1.18 mg/dL (ref 0.40–1.50)
Chloride: 104 mEq/L (ref 96–112)
GFR: 64.12 mL/min (ref >60.00)
Glucose, Bld: 101 mg/dL — ABNORMAL HIGH (ref 70–99)
Potassium: 4.9 mEq/L (ref 3.5–5.1)
Sodium: 140 mEq/L (ref 135–145)
TOTAL PROTEIN: 7 g/dL (ref 6.0–8.3)

## 2017-06-02 LAB — TSH: TSH: 1.51 u[IU]/mL (ref 0.35–4.50)

## 2017-06-02 LAB — LIPASE: LIPASE: 8 U/L — AB (ref 11.0–59.0)

## 2017-06-02 MED ORDER — MUPIROCIN 2 % EX OINT: 1.0000 "application " | TOPICAL_OINTMENT | Freq: Two times a day (BID) | CUTANEOUS | 0 refills | Status: DC

## 2017-06-02 NOTE — Assessment & Plan Note (Signed)
Continues to have issues with this.  He has had an extensive evaluation regarding this previously with no obvious cause found.  We are referring to GI for evaluation of his stool color change as well as fatty liver with the hope that they may also have some input regarding his trouble swallowing.

## 2017-06-02 NOTE — Assessment & Plan Note (Addendum)
Referring to GI for further evaluation.  Check lab work given slight tenderness on exam today.

## 2017-06-02 NOTE — Progress Notes (Signed)
Timothy Rumps, MD Phone: [phone]  Timothy Turner is a 76 y.o. male who presents today for follow-up.  Patient notes continued issues with clay colored stools.  This has been going on for 3-4 years and he has not mentioned this to me previously.  He saw Dr. Derrel Turner for this and had a CT abdomen and pelvis which was gone over with the patient in detail.  He remains concerned about a pancreatic issue.  He is also concerned about the possibility of fatty liver disease.  He reports a liver biopsy 12 years ago where they advised him that he could progress to cirrhosis if he did not stop drinking beer.  He notes some epigastric discomfort since he found out the results of the CT scan.  Urine remains normal color.  He notes his swallowing remains an issue and he has to pure things up to swallow them.  He has had an extensive workup for that.  He did see a new pulmonologist who he states told him his lungs were okay and just advised him to walk.  He notes he does well with walking on flat surfaces though cannot do hills.  He also notes he is following with his urologist and GI prostate.  He continues to feel as though his abdomen gets distended at times.  Patient reports several weeks ago developing a redness around his lips.  He was evaluated at the walk-in clinic and they advised him to use nystatin.  It is somewhat improved though he still gets cracks at the corners of his mouth and occasional redness around his lips.  Social History   Tobacco Use  Smoking Status Former Smoker  . Packs/day: 1.00  . Years: 30.00  . Pack years: 30.00  . Types: Cigarettes  . Last attempt to quit: 06/22/2004  . Years since quitting: 12.9  Smokeless Tobacco Never Used     ROS see history of present illness  Objective  Physical Exam Vitals:   06/02/17 1421  BP: 120/82  Pulse: 61  Temp: 98 F (36.7 C)  SpO2: 96%    BP Readings from Last 3 Encounters:  06/02/17 120/82  05/11/17 124/76  04/30/17  122/68   Wt Readings from Last 3 Encounters:  06/02/17 165 lb 12.8 oz (75.2 kg)  05/11/17 170 lb 9.6 oz (77.4 kg)  04/30/17 168 lb 12 oz (76.5 kg)    Physical Exam  Constitutional: No distress.  HENT:  Slight erythema around his lips with cracking of the skin at the corners of his mouth  Cardiovascular: Normal rate, regular rhythm and normal heart sounds.  Pulmonary/Chest: Effort normal and breath sounds normal.  Abdominal: Soft. Bowel sounds are normal. He exhibits no distension. There is tenderness (minimal epigastric tenderness). There is no rebound and no guarding.  Musculoskeletal: He exhibits no edema.  Neurological: He is alert. Gait normal.  Skin: Skin is warm and dry. He is not diaphoretic.     Assessment/Plan: Please see individual problem list.  Dysphagia, oropharyngeal phase Continues to have issues with this.  He has had an extensive evaluation regarding this previously with no obvious cause found.  We are referring to GI for evaluation of his stool color change as well as fatty liver with the hope that they may also have some input regarding his trouble swallowing.  Clay-colored stools Referring to GI for further evaluation.  Check lab work given slight tenderness on exam today.  Cheilitis Suspect as the cause of his lip issues.  We  will trial mupirocin.  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  He has an appointment with his urologist already scheduled.   Timothy Turner was seen today for follow-up.  Diagnoses and all orders for this visit:  Epigastric discomfort -     Comp Met (CMET) -     Lipase  Dysphagia, oropharyngeal phase -     Ambulatory referral to Gastroenterology  Hypothyroidism, unspecified type -     TSH  Clay-colored stools  Cheilitis  CA in situ prostate  Other orders -     mupirocin ointment (BACTROBAN) 2 %; Apply 1 application topically 2 (two) times daily.    Orders Placed This Encounter  Procedures  . Comp  Met (CMET)  . Lipase  . TSH  . Ambulatory referral to Gastroenterology    Referral Priority:   Routine    Referral Type:   Consultation    Referral Reason:   Specialty Services Required    Number of Visits Requested:   1    Meds ordered this encounter  Medications  . mupirocin ointment (BACTROBAN) 2 %    Sig: Apply 1 application topically 2 (two) times daily.    Dispense:  22 g    Refill:  0     Timothy Rumps, MD [place]

## 2017-06-02 NOTE — Assessment & Plan Note (Signed)
Suspect as the cause of his lip issues.  We will trial mupirocin.

## 2017-06-02 NOTE — Assessment & Plan Note (Signed)
Calcifications in prostate on CT scan.  He has an appointment with his urologist already scheduled.

## 2017-06-02 NOTE — Patient Instructions (Signed)
Nice to see you. We can get you to see a new GI doctor to see if they have anything else to add. Please keep your appointment with your urologist. We will try mupirocin for around her lips. We will check lab work and contact you with the results.

## 2017-06-10 ENCOUNTER — Ambulatory Visit (INDEPENDENT_AMBULATORY_CARE_PROVIDER_SITE_OTHER): Payer: Medicare Other | Admitting: Gastroenterology

## 2017-06-10 ENCOUNTER — Encounter: Payer: Self-pay | Admitting: Gastroenterology

## 2017-06-10 ENCOUNTER — Other Ambulatory Visit: Payer: Self-pay

## 2017-06-10 VITALS — BP 130/84 | HR 69 | Temp 97.6°F | Wt 165.2 lb

## 2017-06-10 DIAGNOSIS — R1312 Dysphagia, oropharyngeal phase: Secondary | ICD-10-CM | POA: Diagnosis not present

## 2017-06-10 DIAGNOSIS — K2270 Barrett's esophagus without dysplasia: Secondary | ICD-10-CM

## 2017-06-10 DIAGNOSIS — Z1211 Encounter for screening for malignant neoplasm of colon: Secondary | ICD-10-CM

## 2017-06-10 DIAGNOSIS — K9089 Other intestinal malabsorption: Secondary | ICD-10-CM

## 2017-06-10 DIAGNOSIS — R197 Diarrhea, unspecified: Secondary | ICD-10-CM | POA: Diagnosis not present

## 2017-06-10 DIAGNOSIS — R140 Abdominal distension (gaseous): Secondary | ICD-10-CM

## 2017-06-10 DIAGNOSIS — Z1212 Encounter for screening for malignant neoplasm of rectum: Secondary | ICD-10-CM | POA: Diagnosis not present

## 2017-06-10 MED ORDER — CHOLESTYRAMINE 4 G PO PACK: 4.0000 g | PACK | Freq: Three times a day (TID) | ORAL | 12 refills | Status: DC

## 2017-06-10 NOTE — Progress Notes (Signed)
[name] MD, MRCP(U.K) [address]  [place]  [place], [place] [address]  Main: [phone]  Fax: [phone]   Gastroenterology Consultation  Referring Provider:     Leone Haven, MD Primary Care Physician:  Leone Haven, MD Primary Gastroenterologist:  Dr. [name]  Reason for Consultation:     Dysphagia        HPI:   Timothy Turner is a 76 y.o. y/o male referred for consultation & management  by Dr. Caryl Bis, Angela Adam, MD.     Review and summarization of old records:  He has been referred for dysphagia. He has previously been evelauted by Dr Loletha [facility] and was last seen back in 05/2016. Review of the last office note suggests he had treatment for the UES with botox at [facility] , which helped transiently , he felt it made his regurgitation worse. 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 He has been seen by speech therapy in the past . Appears the prior GI doctor and the patient couldn't agree to a treatment plan and no further follow up was offered. The patient had refused a feeding tube. Seen by Dr Adria Devon in 09/2015 , per last note in 09/2015 , the patient has had aspiration  Since 07/2014 , hoarseness ,reflux, no prior food impactions. Modified barium and barium swallow in the ast had shown aspiration . Vocal cords evaluated previously and was normal . His impression was that he would benefit from speech therapy . He had been seen by [facility] prior to that    Esophageal Manometry (01/10/2015): 1) The LES relaxed appropriately in all swallows 2) Normal peristalsis in all swallows 3) Hypotensive LES resting pressure of 7.2 (normal>10) 4) 4 cm hiatal hernia 90% complete bolus transit total transit time 5.6 secs   EGD (02/18/15): "There were esophageal mucosal changes secondary to establish short segment Barrett's disease present at the gastroesophageal junction. The maximum longitudinal extent of these mucosal  changes was 1 cm in length. Mucosa was biopsied with a cold forceps for histology in 4 quadrants. There is a very sharp angulation into the stomach at the level of the GE junction. The appearance is almost that of a small paraesophageal hernia. The examined esophagus was otherwise normal. Patchy mild inflammation characterized by congestion (edema) and erythema was found in the gastric antrum. Biopsies were taken with a cold forceps for histology. The cardia and gastric fundus were normal on retroflexion. The examined duodenum was normal. The examined esophagus was otherwise normal. IMPRESSION: Esophageal mucosal changes secondary to establish short segment Barrett's disease. Biopsied. Gastritis. Biopsied. Normal examined duodenum.  Stomach biopsies negative for H. pylori, dysplasia, malignancy.  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 Negative for dysplasia and malignancy.  Impression:  Radiographic studies: Barium swallow (01/14/15): FINDINGS: The patient ingested the initial sip of thick barium without apparent difficulty. However, there was free aspiration of the barium into the larynx and upper trachea without elicit station of a cough reflex. Thin barium was then administered without definite additional aspiration. Survey views of the lower esophagus revealed no evidence of stricture. The study was then terminated.  IMPRESSION: Free aspiration of ingested barium into the trachea and esophagus.  ENT evaluation is recommended. A modified barium swallow may be useful as well.  Modified barium swallow (02/04/15): Mild pharyngeal dysphagia characterized by decreased pharyngeal pressure generation, decrease hi low laryngeal movement, incomplete epiglottic inversion, decreased duration and amplitude of  the UES opening and mild pharyngeal residue. There was flash laryngeal penetration of liquids (nectar thick and thin). Hawking and swallow maneuver is  effective in clearing most pharyngeal residue. The patient does not appear to be at risk for significant amount of prandial aspiration. The patient was given written and peripheral teaching regarding the Shaker exercise.   MRI brain (04/18/15): Normal. 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 The brainstem and cerebellum otherwise within normal limits   CT head in 12/2016- pleural nodules, hepatic steatosis  Barium study 03/2016 showed no obstruction but immediate barium aspiration.  Last colonoscopy was in 2013 by Dr Gustavo Lah and was advised to return in 5 years.    Today he says :   He says he is here today to see me as he didn't like his previous doctors.   He has multiple issues to discuss today- advised to mention that most pressing issue today and we will address the other issues at the next visit.   He says he has grey stool , comes out solid, at times liquid. Ongoing since his gall bladder surgery. Some central abdominal pain. Lots of gas. The color of the stool bothers him the most. No blood in his stool. No matter what he eats the stool is colored the same. Feels weak. Consumes hard candy daily. For his dry mouth .   Says he feels issues of dysphagia persist. Refuses any feeding tube. Has had extensive evaluation in the past.    Past Medical History:  Diagnosis Date  . Aspiration precautions    uses thicken in liquids.  Eats pureed food.  . Barrett esophagus   . Depression   . Gallstones   . GERD (gastroesophageal reflux disease)   . Heart murmur    History of  . Hemorrhoids   . Hypertension   . Hypothyroidism   . Mitral valve disease   . Myalgia    Multiple  . Neuromuscular disorder ([place])   . PAD (peripheral artery disease) ([facility])    lower legs  . Pain syndrome, chronic    Severe  . Pneumonia    three times, last time >10 years ago  . Psoriasis   . Rheumatic heart valve incompetence   . Shortness of breath dyspnea   .  Thyroiditis     Past Surgical History:  Procedure Laterality Date  . BACK SURGERY    . CARDIAC CATHETERIZATION N/A 02/22/2015   Procedure: Left Heart Cath and Coronary Angiography;  Surgeon: Minna Merritts, MD;  Location: [facility];  Service: Cardiovascular;  Laterality: N/A;  . CATARACT EXTRACTION W/PHACO Left 07/29/2016   Procedure: CATARACT EXTRACTION PHACO AND INTRAOCULAR LENS PLACEMENT (I[facility])  Left eye IVA Topical;  Surgeon: Leandrew Koyanagi, MD;  Location: [place];  Service: Ophthalmology;  Laterality: Left;  . CATARACT EXTRACTION W/PHACO Right 08/19/2016   Procedure: CATARACT EXTRACTION PHACO AND INTRAOCULAR LENS PLACEMENT ([place]) right  TORIC;  Surgeon: Leandrew Koyanagi, MD;  Location: [facility];  Service: Ophthalmology;  Laterality: Right;  prefers early morning  . CHOLECYSTECTOMY  08/03/13  . COLONOSCOPY  03-08-12   Dr Donnella Sham  . DIRECT LARYNGOSCOPY N/A 02/17/2016   Procedure: DIRECT LARYNGOSCOPY;  Surgeon: Jodi Marble, MD;  Location: [facility];  Service: ENT;  Laterality: N/A;  . ESOPHAGEAL MANOMETRY N/A 02/27/2015   Procedure: ESOPHAGEAL MANOMETRY (EM);  Surgeon: Lollie Sails, MD;  Location[facility];  Service: Endoscopy;  Laterality: N/A;  . ESOPHAGOGASTRODUODENOSCOPY (  EGD) WITH PROPOFOL N/A 02/18/2015   Procedure: ESOPHAGOGASTRODUODENOSCOPY (EGD) WITH PROPOFOL;  Surgeon: Lollie Sails, MD;  Location: [facility] ENDOSCOPY;  Service: Endoscopy;  Laterality: N/A;  . ESOPHAGOSCOPY W/ BOTOX INJECTION N/A 02/17/2016   Procedure: ESOPHAGOSCOPY WITH BOTOX INJECTION  ;  Surgeon: Jodi Marble, MD;  Location: [place];  Service: ENT;  Laterality: N/A;  . HEMORROIDECTOMY    . UPPER GI ENDOSCOPY  03-06-2013   Dr Donnella Sham    Prior to Admission medications   Medication Sig Start Date End Date Taking? Authorizing Provider  celecoxib (CELEBREX) 200 MG capsule Take 1 capsule (200 mg total) by mouth 2 (two) times daily.  10/17/15   Jackolyn Confer, MD  clobetasol (TEMOVATE) 0.05 % cream Apply topically as needed. Reported on 12/03/2015    [provider]  clonazePAM (KLONOPIN) 2 MG tablet Take 1 tablet (2 mg total) by mouth 3 (three) times daily as needed (severe muscle spasm). 05/27/17   Leone Haven, MD  colesevelam (WELCHOL) 625 MG tablet TAKE 3 TABLETS TWICE DAILY WITH MEALS (NEED TO CONTACT OFFICE TO SCHEDULE FUTURE APPOINTMENT AND REFILLS) Patient not taking: Reported on 06/02/2017 03/11/15   Minna Merritts, MD  colestipol (COLESTID) 1 G tablet Take 3 tablets (3 g total) by mouth 2 (two) times daily. 03/19/15   Minna Merritts, MD  escitalopram (LEXAPRO) 10 MG tablet Take 1 tablet (10 mg total) by mouth daily. 11/10/16   Leone Haven, MD  esomeprazole (NEXIUM) 40 MG capsule Take 1 capsule (40 mg total) by mouth daily. 11/12/16   Leone Haven, MD  ezetimibe (ZETIA) 10 MG tablet TAKE 1 TABLET DAILY 03/31/17   Leone Haven, MD  finasteride (PROSCAR) 5 MG tablet Take 5 mg by mouth daily.      [provider]  fluticasone (FLONASE) 50 MCG/ACT nasal spray Place into both nostrils daily as needed for allergies or rhinitis.    [provider]  levothyroxine (SYNTHROID, LEVOTHROID) 75 MCG tablet TAKE 1 TABLET DAILY 05/04/17   Leone Haven, MD  Multiple Vitamins-Minerals (CENTRUM SILVER PO) Take 1 tablet by mouth every evening.     [provider]  mupirocin ointment (BACTROBAN) 2 % Apply 1 application topically 2 (two) times daily. 06/02/17   Leone Haven, MD  Red Yeast Rice Extract (RED YEAST RICE PO) Take 1 capsule by mouth 2 (two) times daily.    [provider]  Testosterone (AXIRON) 30 MG/ACT SOLN Apply 1 pump under each armpit every night    [provider]    Family History  Problem Relation Age of Onset  . Heart failure Mother   . Heart failure Father   . Emphysema Father        smoked  . Cancer Sister   .  Aneurysm Sister        Brain     Social History   Tobacco Use  . Smoking status: Former Smoker    Packs/day: 1.00    Years: 30.00    Pack years: 30.00    Types: Cigarettes    Last attempt to quit: 06/22/2004    Years since quitting: 12.9  . Smokeless tobacco: Never Used  Substance Use Topics  . Alcohol use: No    Alcohol/week: 0.0 oz  . Drug use: No    Allergies as of 06/10/2017 - Review Complete 06/02/2017  Allergen Reaction Noted  . Cymbalta [duloxetine hcl]  08/23/2012  . Nsaids Other (See Comments) 11/07/2015  .  Rosuvastatin Other (See Comments) 07/12/2009  . Statins Other (See Comments) 07/12/2009  . Tolmetin Other (See Comments) 11/07/2015    Review of Systems:    All systems reviewed and negative except where noted in HPI.   Physical Exam:  There were no vitals taken for this visit. No LMP for male patient. Psych:  Alert and cooperative. Normal mood and affect. General:   Alert,  Well-developed, well-nourished, pleasant and cooperative in NAD Head:  Normocephalic and atraumatic. Eyes:  Sclera clear, no icterus.   Conjunctiva pink. Ears:  Normal auditory acuity. Nose:  No deformity, discharge, or lesions. Mouth:  No deformity or lesions,oropharynx pink & moist. Neck:  Supple; no masses or thyromegaly. Lungs:  Respirations even and unlabored.  Clear throughout to auscultation.   No wheezes, crackles, or rhonchi. No acute distress. Heart:  Regular rate and rhythm; no murmurs, clicks, rubs, or gallops. Abdomen:  Normal bowel sounds.  No bruits.  Soft, non-tender and non-distended without masses, hepatosplenomegaly or hernias noted.  No guarding or rebound tenderness.    Msk:  Symmetrical without gross deformities. Good, equal movement & strength bilaterally. Pulses:  Normal pulses noted. Extremities:  No clubbing or edema.  No cyanosis. Neurologic:  Alert and oriented x3;  grossly normal neurologically. Skin:  Intact without significant lesions or rashes. No  jaundice. Lymph Nodes:  No significant cervical adenopathy. Psych:  Alert and cooperative. Normal mood and affect.  Imaging Studies: Ct Abdomen Pelvis W Contrast  Result Date: 05/20/2017 CLINICAL DATA:  Abdominal distention, unintended weight loss EXAM: CT ABDOMEN AND PELVIS WITH CONTRAST TECHNIQUE: Multidetector CT imaging of the abdomen and pelvis was performed using the standard protocol following bolus administration of intravenous contrast. CONTRAST:  167mL ISOVUE-300 IOPAMIDOL (ISOVUE-300) INJECTION 61% COMPARISON:  CT abdomen pelvis of 05/23/2015 and CT chest abdomen pelvis of 04/08/2004 FINDINGS: Lower chest: On lung window images, there are prominent markings within the left lower lobe. Some these changes could be due to aspiration chronically, being unchanged from the prior CTs. On the very first image a nodule in the left lower lobe appears unchanged compared prior study measuring 6 mm in diameter, consistent with a benign process. Hepatobiliary: The liver appears somewhat low in attenuation possibly indicating fatty infiltration. Surgical clips are noted prior cholecystectomy. No focal hepatic abnormality is seen. Pancreas: The pancreas is normal in size and the pancreatic duct is not dilated. Spleen: The spleen is unremarkable. Adrenals/Urinary Tract: The adrenal glands appear normal. No renal calculi are seen. Tiny renal cysts are noted bilaterally. No hydronephrosis is seen. On delayed images the pelvocaliceal systems are unremarkable. The ureters appear normal in caliber to the bladder. The urinary bladder is unremarkable other than indentation inferiorly by somewhat prominent prostate. Stomach/Bowel: The stomach is slightly distended with oral contrast media. No small bowel distention is seen. There are a few scattered rectosigmoid colon diverticula but no diverticulitis is seen. A moderate amount of feces is noted diffusely throughout the colon. The terminal ileum is unremarkable. The  appendix is not visualized, and no inflammatory process is seen within the right lower quadrant appear Vascular/Lymphatic: The abdominal aorta is normal in caliber with moderate abdominal aortic atherosclerosis present. No adenopathy is seen. Reproductive: The prostate gland is prominent with a few prostatic calcifications noted. Other: None. 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 No compression deformity is seen. A sclerotic focus within the upper sacrum is stable. IMPRESSION: 1. No explanation for the patient's abdominal distention and weight loss is seen. No mass  or adenopathy is noted. 2. Question fatty infiltration of the liver.  Correlate with LFTs. 3. Moderate amount of feces throughout the colon. Few scattered diverticula within the rectosigmoid colon. 4. Moderate abdominal aortic atherosclerosis. 5. Chronic changes within the left lower lobe. 6. Prominent prostate. Electronically Signed   By: Ivar Drape M.D.   On: 05/20/2017 12:25    Assessment and Plan:   Timothy Turner is a 76 y.o. y/o male has been referred for dysphagia, The patient had a multiple issues he wanted discuss ranging from shortness of breath , dysphgia, abdominal pressure, gas, diarrhea. We addressed the most pressing issues. I reassured him that he had nothing inside his abdomen causing him shortness of breath such as a growth. I explained his dysphagia has been extensively evaluated in the past and he has a transfer dysphagia. 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 He is due for a screening colonoscopy and surveillance for barrettes esophagus,   Plan:  1. 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 Try activated charcoal tablets for gas 3. Speech therapy for dysphagia- does not want a referral  4. I strongly feel a lot of his symptoms are due to stress/anxiety- I did mention that he consider options.  5. Screening colonoscopy + EGD to screen for barrettes  esophagus   Over 30 minutes was spent summarizing his prior records.In addition  I spent over 60 min's of time with the patient , over 50% was spent with the patient directly counseling and explaining his medical conditions.   I have discussed alternative options, risks & benefits,  which include, but are not limited to, bleeding, infection, perforation,respiratory complication & drug reaction.  The patient agrees with this plan & written consent will be obtained.      Follow up in 2 months   Dr [name] MD,MRCP(U.K)

## 2017-06-10 NOTE — Patient Instructions (Signed)
You have been seen by Dr. [name].  We appreciate you choosing to see [place] today.  Dr. Vicente Males has advised the following:  Plan:  1. 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 Try activated charcoal tablets for gas 3.. Screening colonoscopy + EGD to screen for barrettes esophagus  06/17/17

## 2017-06-10 NOTE — Addendum Note (Signed)
Addended by: Vanetta Mulders on: 06/10/2017 03:51 PM   Modules accepted: Orders, SmartSet

## 2017-06-11 ENCOUNTER — Encounter: Payer: Self-pay | Admitting: *Deleted

## 2017-06-17 ENCOUNTER — Encounter: Payer: Self-pay | Admitting: *Deleted

## 2017-06-17 ENCOUNTER — Encounter
Admission: RE | Disposition: A | Discharge status: Home / Self Care | Payer: Self-pay | Source: Ambulatory Visit | Attending: Gastroenterology

## 2017-06-17 ENCOUNTER — Ambulatory Visit: Payer: Medicare Other | Admitting: Anesthesiology

## 2017-06-17 ENCOUNTER — Ambulatory Visit
Admission: RE | Admit: 2017-06-17 | Discharge: 2017-06-17 | Disposition: A | Discharge status: Home / Self Care | Payer: Medicare Other | Source: Ambulatory Visit | Attending: Gastroenterology | Admitting: Gastroenterology

## 2017-06-17 DIAGNOSIS — D122 Benign neoplasm of ascending colon: Secondary | ICD-10-CM | POA: Insufficient documentation

## 2017-06-17 DIAGNOSIS — Z1211 Encounter for screening for malignant neoplasm of colon: Secondary | ICD-10-CM | POA: Insufficient documentation

## 2017-06-17 DIAGNOSIS — I10 Essential (primary) hypertension: Secondary | ICD-10-CM | POA: Insufficient documentation

## 2017-06-17 DIAGNOSIS — F329 Major depressive disorder, single episode, unspecified: Secondary | ICD-10-CM | POA: Insufficient documentation

## 2017-06-17 DIAGNOSIS — I739 Peripheral vascular disease, unspecified: Secondary | ICD-10-CM | POA: Insufficient documentation

## 2017-06-17 DIAGNOSIS — K5730 Diverticulosis of large intestine without perforation or abscess without bleeding: Secondary | ICD-10-CM | POA: Diagnosis not present

## 2017-06-17 DIAGNOSIS — K219 Gastro-esophageal reflux disease without esophagitis: Secondary | ICD-10-CM | POA: Insufficient documentation

## 2017-06-17 DIAGNOSIS — Z888 Allergy status to other drugs, medicaments and biological substances status: Secondary | ICD-10-CM | POA: Diagnosis not present

## 2017-06-17 DIAGNOSIS — K640 First degree hemorrhoids: Secondary | ICD-10-CM | POA: Diagnosis not present

## 2017-06-17 DIAGNOSIS — K2270 Barrett's esophagus without dysplasia: Secondary | ICD-10-CM | POA: Diagnosis not present

## 2017-06-17 DIAGNOSIS — E039 Hypothyroidism, unspecified: Secondary | ICD-10-CM | POA: Diagnosis not present

## 2017-06-17 DIAGNOSIS — Z79899 Other long term (current) drug therapy: Secondary | ICD-10-CM | POA: Insufficient documentation

## 2017-06-17 DIAGNOSIS — K210 Gastro-esophageal reflux disease with esophagitis: Secondary | ICD-10-CM | POA: Insufficient documentation

## 2017-06-17 DIAGNOSIS — Z87891 Personal history of nicotine dependence: Secondary | ICD-10-CM | POA: Diagnosis not present

## 2017-06-17 HISTORY — PX: ESOPHAGOGASTRODUODENOSCOPY (EGD) WITH PROPOFOL: SHX5813

## 2017-06-17 HISTORY — PX: COLONOSCOPY WITH PROPOFOL: SHX5780

## 2017-06-17 SURGERY — ESOPHAGOGASTRODUODENOSCOPY (EGD) WITH PROPOFOL
Anesthesia: General

## 2017-06-17 MED ORDER — EPHEDRINE SULFATE 50 MG/ML IJ SOLN
INTRAMUSCULAR | Status: DC | PRN
  Administered 2017-06-17 (×2): 10 mg via INTRAVENOUS
  Administered 2017-06-17: 5 mg via INTRAVENOUS
  Administered 2017-06-17: 10 mg via INTRAVENOUS
  Administered 2017-06-17: 5 mg via INTRAVENOUS

## 2017-06-17 MED ORDER — FENTANYL CITRATE (PF) 100 MCG/2ML IJ SOLN
INTRAMUSCULAR | Status: AC
  Filled 2017-06-17: qty 2

## 2017-06-17 MED ORDER — PROPOFOL 500 MG/50ML IV EMUL
INTRAVENOUS | Status: DC | PRN
  Administered 2017-06-17: 120 ug/kg/min via INTRAVENOUS

## 2017-06-17 MED ORDER — LIDOCAINE HCL (PF) 2 % IJ SOLN
INTRAMUSCULAR | Status: AC
Start: 2017-06-17 — End: ?
  Filled 2017-06-17: qty 10

## 2017-06-17 MED ORDER — PROPOFOL 500 MG/50ML IV EMUL
INTRAVENOUS | Status: AC
  Filled 2017-06-17: qty 50

## 2017-06-17 MED ORDER — BUTAMBEN-TETRACAINE-BENZOCAINE 2-2-14 % EX AERO
INHALATION_SPRAY | CUTANEOUS | Status: AC
  Filled 2017-06-17: qty 5

## 2017-06-17 MED ORDER — MIDAZOLAM HCL 2 MG/2ML IJ SOLN
INTRAMUSCULAR | Status: DC | PRN
  Administered 2017-06-17 (×2): 1 mg via INTRAVENOUS

## 2017-06-17 MED ORDER — SODIUM CHLORIDE 0.9 % IV SOLN
INTRAVENOUS | Status: DC
  Administered 2017-06-17: 09:00:00 via INTRAVENOUS

## 2017-06-17 MED ORDER — LIDOCAINE HCL (CARDIAC) 20 MG/ML IV SOLN
INTRAVENOUS | Status: DC | PRN
  Administered 2017-06-17: 30 mg via INTRAVENOUS

## 2017-06-17 MED ORDER — FENTANYL CITRATE (PF) 100 MCG/2ML IJ SOLN
INTRAMUSCULAR | Status: DC | PRN
  Administered 2017-06-17: 50 ug via INTRAVENOUS

## 2017-06-17 MED ORDER — MIDAZOLAM HCL 2 MG/2ML IJ SOLN
INTRAMUSCULAR | Status: AC
Start: 2017-06-17 — End: ?
  Filled 2017-06-17: qty 2

## 2017-06-17 MED ORDER — PROPOFOL 500 MG/50ML IV EMUL
INTRAVENOUS | Status: AC
Start: 2017-06-17 — End: ?
  Filled 2017-06-17: qty 50

## 2017-06-17 NOTE — Transfer of Care (Signed)
Immediate Anesthesia Transfer of Care Note  Patient: Cutler Sunday Dulski  Procedure(s) Performed: ESOPHAGOGASTRODUODENOSCOPY (EGD) WITH PROPOFOL (N/A ) COLONOSCOPY WITH PROPOFOL (N/A )  Patient Location: PACU  Anesthesia Type:General  Level of Consciousness: awake and sedated  Airway & Oxygen Therapy: Patient Spontanous Breathing and Patient connected to nasal cannula oxygen  Post-op Assessment: Report given to RN and Post -op Vital signs reviewed and stable  Post vital signs: Reviewed and stable  Last Vitals:  Vitals:   06/17/17 0836  BP: (!) 140/101  Pulse: 75  Resp: 18  Temp: (!) 35.9 C  SpO2: 99%    Last Pain:  Vitals:   06/17/17 0836  TempSrc: Tympanic         Complications: No apparent anesthesia complications

## 2017-06-17 NOTE — Anesthesia Post-op Follow-up Note (Signed)
Anesthesia QCDR form completed.        

## 2017-06-17 NOTE — Anesthesia Procedure Notes (Signed)
Performed by: Cook-Martin, Kendan Cornforth Pre-anesthesia Checklist: Patient identified, Emergency Drugs available, Suction available, Patient being monitored and Timeout performed Patient Re-evaluated:Patient Re-evaluated prior to induction Oxygen Delivery Method: Nasal cannula Preoxygenation: Pre-oxygenation with 100% oxygen Induction Type: IV induction Placement Confirmation: positive ETCO2 and CO2 detector       

## 2017-06-17 NOTE — H&P (Signed)
Timothy Bellows, MD [address] [place], [place], [place], [address] [address], [place], [place], [place], [address] Phone: [phone]  Fax: [phone]  Primary Care Physician:  Leone Haven, MD   Pre-Procedure History & Physical: HPI:  Timothy Turner is a 76 y.o. male is here for an endoscopy and colonoscopy    Past Medical History:  Diagnosis Date  . Aspiration precautions    uses thicken in liquids.  Eats pureed food.  . Barrett esophagus   . Depression   . Gallstones   . GERD (gastroesophageal reflux disease)   . Heart murmur    History of  . Hemorrhoids   . Hypertension   . Hypothyroidism   . Mitral valve disease   . Myalgia    Multiple  . Neuromuscular disorder ([place])   . PAD (peripheral artery disease) (HCC)    lower legs  . Pain syndrome, chronic    Severe  . Pneumonia    three times, last time >10 years ago  . Psoriasis   . Rheumatic heart valve incompetence   . Shortness of breath dyspnea   . Thyroiditis     Past Surgical History:  Procedure Laterality Date  . BACK SURGERY    . CARDIAC CATHETERIZATION N/A 02/22/2015   Procedure: Left Heart Cath and Coronary Angiography;  Surgeon: Minna Merritts, MD;  Location: [facility];  Service: Cardiovascular;  Laterality: N/A;  . CATARACT EXTRACTION W/PHACO Left 07/29/2016   Procedure: CATARACT EXTRACTION PHACO AND INTRAOCULAR LENS PLACEMENT (IOC)  Left eye IVA Topical;  Surgeon: Leandrew Koyanagi, MD;  Location: [facility];  Service: Ophthalmology;  Laterality: Left;  . CATARACT EXTRACTION W/PHACO Right 08/19/2016   Procedure: CATARACT EXTRACTION PHACO AND INTRAOCULAR LENS PLACEMENT ([place]) right  TORIC;  Surgeon: Leandrew Koyanagi, MD;  Location: [facility];  Service: Ophthalmology;  Laterality: Right;  prefers early morning  . CHOLECYSTECTOMY  08/03/13  . COLONOSCOPY  03-08-12   Dr Donnella Sham  . DIRECT LARYNGOSCOPY N/A 02/17/2016   Procedure: DIRECT LARYNGOSCOPY;   Surgeon: Jodi Marble, MD;  Location: [facility];  Service: ENT;  Laterality: N/A;  . ESOPHAGEAL MANOMETRY N/A 02/27/2015   Procedure: ESOPHAGEAL MANOMETRY (EM);  Surgeon: Lollie Sails, MD;  Location: [facility] ENDOSCOPY;  Service: Endoscopy;  Laterality: N/A;  . ESOPHAGOGASTRODUODENOSCOPY (EGD) WITH PROPOFOL N/A 02/18/2015   Procedure: ESOPHAGOGASTRODUODENOSCOPY (EGD) WITH PROPOFOL;  Surgeon: Lollie Sails, MD;  Location: [facility] ENDOSCOPY;  Service: Endoscopy;  Laterality: N/A;  . ESOPHAGOSCOPY W/ BOTOX INJECTION N/A 02/17/2016   Procedure: ESOPHAGOSCOPY WITH BOTOX INJECTION  ;  Surgeon: Jodi Marble, MD;  Location: [facility];  Service: ENT;  Laterality: N/A;  . HEMORROIDECTOMY    . UPPER GI ENDOSCOPY  03-06-2013   Dr Donnella Sham    Prior to Admission medications   Medication Sig Start Date End Date Taking? Authorizing Provider  cholestyramine (QUESTRAN) 4 g packet Take 1 packet (4 g total) by mouth 3 (three) times daily with meals. 06/10/17  Yes Timothy Bellows, MD  clonazePAM (KLONOPIN) 2 MG tablet Take 1 tablet (2 mg total) by mouth 3 (three) times daily as needed (severe muscle spasm). 05/27/17  Yes Leone Haven, MD  colesevelam (WELCHOL) 625 MG tablet TAKE 3 TABLETS TWICE DAILY WITH MEALS (NEED TO CONTACT OFFICE TO SCHEDULE FUTURE APPOINTMENT AND REFILLS) 03/11/15  Yes Gollan, Kathlene November, MD  colestipol (COLESTID) 1 G tablet Take 3 tablets (3 g total) by mouth 2 (  two) times daily. 03/19/15  Yes Gollan, Kathlene November, MD  escitalopram (LEXAPRO) 10 MG tablet Take 1 tablet (10 mg total) by mouth daily. 11/10/16  Yes Leone Haven, MD  esomeprazole (NEXIUM) 40 MG capsule Take 1 capsule (40 mg total) by mouth daily. 11/12/16  Yes Leone Haven, MD  ezetimibe (ZETIA) 10 MG tablet TAKE 1 TABLET DAILY 03/31/17  Yes Leone Haven, MD  finasteride (PROSCAR) 5 MG tablet Take 5 mg by mouth daily.     Yes [provider]  fluticasone (FLONASE) 50 MCG/ACT nasal  spray Place into both nostrils daily as needed for allergies or rhinitis.   Yes [provider]  levothyroxine (SYNTHROID, LEVOTHROID) 75 MCG tablet TAKE 1 TABLET DAILY 05/04/17  Yes Leone Haven, MD  Multiple Vitamins-Minerals (CENTRUM SILVER PO) Take 1 tablet by mouth every evening.    Yes [provider]  Red Yeast Rice Extract (RED YEAST RICE PO) Take 1 capsule by mouth 2 (two) times daily.   Yes [provider]  clobetasol (TEMOVATE) 0.05 % cream Apply topically as needed. Reported on 12/03/2015    [provider]  mupirocin ointment (BACTROBAN) 2 % Apply 1 application topically 2 (two) times daily. 06/02/17   Leone Haven, MD  Testosterone Hinda Kehr) 30 MG/ACT SOLN Apply 1 pump under each armpit every night    [provider]    Allergies as of 06/10/2017 - Review Complete 06/10/2017  Allergen Reaction Noted  . Cymbalta [duloxetine hcl]  08/23/2012  . Nsaids Other (See Comments) 11/07/2015  . Rosuvastatin Other (See Comments) 07/12/2009  . Statins Other (See Comments) 07/12/2009  . Tolmetin Other (See Comments) 11/07/2015    Family History  Problem Relation Age of Onset  . Heart failure Mother   . Heart failure Father   . Emphysema Father        smoked  . Cancer Sister   . Aneurysm Sister        Brain    Social History   Socioeconomic History  . Marital status: Married    Spouse name: Not on file  . Number of children: 2  . Years of education: Not on file  . Highest education level: Not on file  Social Needs  . Financial resource strain: Not on file  . Food insecurity - worry: Not on file  . Food insecurity - inability: Not on file  . Transportation needs - medical: Not on file  . Transportation needs - non-medical: Not on file  Occupational History  . 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 Occupation: Disabled  Tobacco Use  . Smoking status: Former Smoker    Packs/day: 1.00     Years: 30.00    Pack years: 30.00    Types: Cigarettes    Last attempt to quit: 06/22/2004    Years since quitting: 12.9  . Smokeless tobacco: Never Used  Substance and Sexual Activity  . Alcohol use: No    Alcohol/week: 0.0 oz  . Drug use: No  . Sexual activity: No  Other Topics Concern  . Not on file  Social History Narrative   Drinks 4-5 cups of coffee a day and 3-4 cans of soda a day.    Review of Systems: See HPI, otherwise negative ROS  Physical Exam: BP (!) 140/101   Pulse 75   Temp (!) 96.6 F (35.9 C) (Tympanic)   Resp 18   Ht 6' (1.829 m)   [facility]  165 lb (74.8 kg)   SpO2 99%   BMI 22.38 kg/m  General:   Alert,  pleasant and cooperative in NAD Head:  Normocephalic and atraumatic. Neck:  Supple; no masses or thyromegaly. Lungs:  Clear throughout to auscultation, normal respiratory effort.    Heart:  +S1, +S2, Regular rate and rhythm, No edema. Abdomen:  Soft, nontender and nondistended. Normal bowel sounds, without guarding, and without rebound.   Neurologic:  Alert and  oriented x4;  grossly normal neurologically.  Impression/Plan: Jolayne Panther is here for an endoscopy and colonoscopy  to be performed for  evaluation of barretts esophagus and colon cancer screening     Risks, benefits, limitations, and alternatives regarding endoscopy have been reviewed with the patient.  Questions have been answered.  All parties agreeable.   Timothy Bellows, MD  06/17/2017, 9:45 AM

## 2017-06-17 NOTE — Anesthesia Postprocedure Evaluation (Signed)
Anesthesia Post Note  Patient: Colt Martelle Bilbo  Procedure(s) Performed: ESOPHAGOGASTRODUODENOSCOPY (EGD) WITH PROPOFOL (N/A ) COLONOSCOPY WITH PROPOFOL (N/A )  Patient location during evaluation: Endoscopy Anesthesia Type: General Level of consciousness: awake and alert Pain management: pain level controlled Vital Signs Assessment: post-procedure vital signs reviewed and stable Respiratory status: spontaneous breathing, nonlabored ventilation, respiratory function stable and patient connected to nasal cannula oxygen Cardiovascular status: blood pressure returned to baseline and stable Postop Assessment: no apparent nausea or vomiting Anesthetic complications: no     Last Vitals:  Vitals:   06/17/17 1028 06/17/17 1048  BP:  132/85  Pulse:    Resp:    Temp: (!) 36.1 C   SpO2:      Last Pain:  Vitals:   06/17/17 1108  TempSrc:   PainSc: 0-No pain                 Precious Haws Piscitello

## 2017-06-17 NOTE — Op Note (Signed)
[facility] Gastroenterology Patient Name: Baldemar Dady Procedure Date: 06/17/2017 9:44 AM MRN: [id number] Account #: [id number] Date of Birth: 05-14-1941 Admit Type: Outpatient Age: 76 Room: [facility] ENDO ROOM 3 Gender: Male Note Status: Finalized Procedure:            Upper GI endoscopy Indications:          Surveillance for malignancy due to personal history of                        Barrett's esophagus Providers:            [name] MD, MD Medicines:            Monitored Anesthesia Care Complications:        No immediate complications. Procedure:            Pre-Anesthesia Assessment:                       - Prior to the procedure, a History and Physical was                        performed, and patient medications, allergies and                        sensitivities were reviewed. The patient's tolerance of                        previous anesthesia was reviewed.                       - The risks and benefits of the procedure and the                        sedation options and risks were discussed with the                        patient. All questions were answered and informed                        consent was obtained.                       - ASA Grade Assessment: III - A patient with severe                        systemic disease.                       After obtaining informed consent, the endoscope was                        passed under direct vision. Throughout the procedure,                        the patient's blood pressure, pulse, and oxygen                        saturations were monitored continuously. The Endoscope                        was introduced through the mouth, and advanced to the  third part of duodenum. The upper GI endoscopy was                        accomplished with ease. The patient tolerated the                        procedure well. Findings:      The Z-line was irregular.      Islands of  salmon-colored mucosa were present. No other visible       abnormalities were present. The maximum longitudinal extent of these       esophageal mucosal changes was 0.7 cm in length. Biopsies were taken       with a cold forceps for histology.      The stomach was normal.      The examined duodenum was normal. Impression:           - Z-line irregular.                       - Salmon-colored mucosa suspicious for Barrett's                        esophagus.                       - Normal stomach.                       - Normal examined duodenum.                       - No specimens collected. Recommendation:       - Await pathology results.                       - Perform a colonoscopy today. Procedure Code(s):    --- Professional ---                       [phone], Esophagogastroduodenoscopy, flexible, transoral;                        with biopsy, single or multiple Diagnosis Code(s):    --- Professional ---                       K22.8, Other specified diseases of esophagus                       K22.70, Barrett's esophagus without dysplasia CPT copyright 2016 American Medical Association. All rights reserved. The codes documented in this report are preliminary and upon coder review may  be revised to meet current compliance requirements. [name], MD [name] MD, MD 06/17/2017 10:00:51 AM This report has been signed electronically. Number of Addenda: 0 Note Initiated On: 06/17/2017 9:44 AM      [facility]

## 2017-06-17 NOTE — Op Note (Signed)
[facility] Gastroenterology Patient Name: Timothy Turner Procedure Date: 06/17/2017 9:43 AM MRN: [id number] Account #: [id number] Date of Birth: [birth year] Admit Type: Outpatient Age: 76 Room: [facility] ENDO ROOM 3 Gender: Male Note Status: Finalized Procedure:            Colonoscopy Indications:          Screening for colorectal malignant neoplasm Providers:            [name] MD, MD Medicines:            Monitored Anesthesia Care Complications:        No immediate complications. Procedure:            Pre-Anesthesia Assessment:                       - Prior to the procedure, a History and Physical was                        performed, and patient medications, allergies and                        sensitivities were reviewed. The patient's tolerance of                        previous anesthesia was reviewed.                       - The risks and benefits of the procedure and the                        sedation options and risks were discussed with the                        patient. All questions were answered and informed                        consent was obtained.                       - ASA Grade Assessment: III - A patient with severe                        systemic disease.                       After obtaining informed consent, the colonoscope was                        passed under direct vision. Throughout the procedure,                        the patient's blood pressure, pulse, and oxygen                        saturations were monitored continuously. The                        Colonoscope was introduced through the anus and                        advanced to the the cecum, identified by the  appendiceal orifice, IC valve and transillumination.                        The colonoscopy was performed with ease. The patient                        tolerated the procedure well. The quality of the bowel   preparation was adequate. Findings:      The perianal and digital rectal examinations were normal.      Non-bleeding internal hemorrhoids were found during retroflexion. The       hemorrhoids were medium-sized and Grade I (internal hemorrhoids that do       not prolapse).      Multiple small-mouthed diverticula were found in the left colon.      A 12 mm polyp was found in the ascending colon. The polyp was sessile.       Preparations were made for mucosal resection. Saline was injected to       raise the lesion. Snare mucosal resection was performed. Resection and       retrieval were complete.      The exam was otherwise without abnormality on direct and retroflexion       views. Impression:           - Non-bleeding internal hemorrhoids.                       - Diverticulosis in the left colon.                       - One 12 mm polyp in the ascending colon, removed with                        mucosal resection. Resected and retrieved.                       - The examination was otherwise normal on direct and                        retroflexion views.                       - Mucosal resection was performed. Resection and                        retrieval were complete. Recommendation:       - Discharge patient to home (with escort).                       - Resume previous diet.                       - Continue present medications.                       - Await pathology results.                       - Return to my office as previously scheduled. Procedure Code(s):    --- Professional ---                       [id number], Colonoscopy, flexible; with endoscopic mucosal  resection Diagnosis Code(s):    --- Professional ---                       Z12.11, Encounter for screening for malignant neoplasm                        of colon                       K64.0, First degree hemorrhoids                       D12.2, Benign neoplasm of ascending colon                        K57.30, Diverticulosis of large intestine without                        perforation or abscess without bleeding CPT copyright 2016 American Medical Association. All rights reserved. The codes documented in this report are preliminary and upon coder review may  be revised to meet current compliance requirements. [name], MD [name] MD, MD 06/17/2017 10:25:11 AM This report has been signed electronically. Number of Addenda: 0 Note Initiated On: 06/17/2017 9:43 AM Scope Withdrawal Time: 0 hours 16 minutes 25 seconds  Total Procedure Duration: 0 hours 20 minutes 41 seconds       [facility]

## 2017-06-17 NOTE — Anesthesia Preprocedure Evaluation (Signed)
Anesthesia Evaluation  Patient identified by MRN, date of birth, ID band Patient awake    Reviewed: Allergy & Precautions, H&P , NPO status , Patient's Chart, lab work & pertinent test results  History of Anesthesia Complications Negative for: history of anesthetic complications  Airway Mallampati: III  TM Distance: >3 FB Neck ROM: limited    Dental  (+) Poor Dentition, Missing, Edentulous Upper, Edentulous Lower, Upper Dentures, Lower Dentures   Pulmonary neg shortness of breath, pneumonia, COPD, former smoker,           Cardiovascular Exercise Tolerance: Good hypertension, (-) angina+ Peripheral Vascular Disease  (-) DOE + Valvular Problems/Murmurs      Neuro/Psych PSYCHIATRIC DISORDERS Depression  Neuromuscular disease    GI/Hepatic Neg liver ROS, GERD  Medicated and Controlled,  Endo/Other  Hypothyroidism   Renal/GU Renal disease  negative genitourinary   Musculoskeletal   Abdominal   Peds  Hematology negative hematology ROS (+)   Anesthesia Other Findings Past Medical History: No date: Aspiration precautions     Comment:  uses thicken in liquids.  Eats pureed food. No date: Barrett esophagus No date: Depression No date: Gallstones No date: GERD (gastroesophageal reflux disease) No date: Heart murmur     Comment:  History of No date: Hemorrhoids No date: Hypertension No date: Hypothyroidism No date: Mitral valve disease No date: Myalgia     Comment:  Multiple No date: Neuromuscular disorder (HCC) No date: PAD (peripheral artery disease) (HCC)     Comment:  lower legs No date: Pain syndrome, chronic     Comment:  Severe No date: Pneumonia     Comment:  three times, last time >10 years ago No date: Psoriasis No date: Rheumatic heart valve incompetence No date: Shortness of breath dyspnea No date: Thyroiditis  Past Surgical History: No date: BACK SURGERY 02/22/2015: CARDIAC CATHETERIZATION; N/A      Comment:  Procedure: Left Heart Cath and Coronary Angiography;                Surgeon: Minna Merritts, MD;  Location: [facility]               CV LAB;  Service: Cardiovascular;  Laterality: N/A; 07/29/2016: CATARACT EXTRACTION W/PHACO; Left     Comment:  Procedure: CATARACT EXTRACTION PHACO AND INTRAOCULAR               LENS PLACEMENT (I[facility])  Left eye IVA Topical;  Surgeon:               Leandrew Koyanagi, MD;  Location: [facility];               Service: Ophthalmology;  Laterality: Left; 08/19/2016: CATARACT EXTRACTION W/PHACO; Right     Comment:  Procedure: CATARACT EXTRACTION PHACO AND INTRAOCULAR               LENS PLACEMENT ([place]) right  TORIC;  Surgeon: Leandrew Koyanagi, MD;  Location: [facility];  Service:              Ophthalmology;  Laterality: Right;  prefers early morning 08/03/13: CHOLECYSTECTOMY 03-08-12: COLONOSCOPY     Comment:  Dr Donnella Sham 02/17/2016: DIRECT LARYNGOSCOPY; N/A     Comment:  Procedure: DIRECT LARYNGOSCOPY;  Surgeon: Jodi Marble,              MD;  Location: [facility];  Service: ENT;  Laterality: N/A; 02/27/2015: ESOPHAGEAL MANOMETRY; N/A     Comment:  Procedure: ESOPHAGEAL MANOMETRY (EM);  Surgeon: Lollie Sails, MD;  Location: [facility] ENDOSCOPY;  Service:               Endoscopy;  Laterality: N/A; 02/18/2015: ESOPHAGOGASTRODUODENOSCOPY (EGD) WITH PROPOFOL; N/A     Comment:  Procedure: ESOPHAGOGASTRODUODENOSCOPY (EGD) WITH               PROPOFOL;  Surgeon: Lollie Sails, MD;  Location:               [facility] ENDOSCOPY;  Service: Endoscopy;  Laterality: N/A; 02/17/2016: ESOPHAGOSCOPY W/ BOTOX INJECTION; N/A     Comment:  Procedure: ESOPHAGOSCOPY WITH BOTOX INJECTION  ;                Surgeon: Jodi Marble, MD;  Location: [place];  Service: ENT;  Laterality: N/A; No date: HEMORROIDECTOMY 03-06-2013: UPPER GI ENDOSCOPY     Comment:  Dr Donnella Sham  BMI     Body Mass Index:  22.38 kg/m      Reproductive/Obstetrics negative OB ROS                             Anesthesia Physical Anesthesia Plan  ASA: III  Anesthesia Plan: General   Post-op Pain Management:    Induction: Intravenous  PONV Risk Score and Plan: Propofol infusion  Airway Management Planned: Natural Airway and Nasal Cannula  Additional Equipment:   Intra-op Plan:   Post-operative Plan:   Informed Consent: I have reviewed the patients History and Physical, chart, labs and discussed the procedure including the risks, benefits and alternatives for the proposed anesthesia with the patient or authorized representative who has indicated his/her understanding and acceptance.   Dental Advisory Given  Plan Discussed with: Anesthesiologist, CRNA and Surgeon  Anesthesia Plan Comments: (Patient consented for risks of anesthesia including but not limited to:  - adverse reactions to medications - risk of intubation if required - damage to teeth, lips or other oral mucosa - sore throat or hoarseness - Damage to heart, brain, lungs or loss of life  Patient voiced understanding.)        Anesthesia Quick Evaluation

## 2017-06-18 ENCOUNTER — Encounter: Payer: Self-pay | Admitting: Gastroenterology

## 2017-06-18 LAB — SURGICAL PATHOLOGY

## 2017-06-22 DIAGNOSIS — I2699 Other pulmonary embolism without acute cor pulmonale: Secondary | ICD-10-CM

## 2017-06-22 HISTORY — DX: Other pulmonary embolism without acute cor pulmonale: I26.99

## 2017-06-23 ENCOUNTER — Telehealth: Payer: Self-pay

## 2017-06-23 NOTE — Telephone Encounter (Signed)
-----   Message from [name], MD sent at 06/23/2017 12:36 PM EST ----- Inform   1.He does have barrettes esophagus  2. 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

## 2017-06-23 NOTE — Telephone Encounter (Signed)
Advised patient of results per Dr. Vicente Males.    - 1.He does have barrettes esophagus  2. 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

## 2017-07-07 ENCOUNTER — Ambulatory Visit (INDEPENDENT_AMBULATORY_CARE_PROVIDER_SITE_OTHER): Payer: Medicare Other | Admitting: Gastroenterology

## 2017-07-07 ENCOUNTER — Encounter: Payer: Self-pay | Admitting: Gastroenterology

## 2017-07-07 VITALS — BP 115/75 | HR 59 | Ht 72.0 in | Wt 163.0 lb

## 2017-07-07 DIAGNOSIS — K9089 Other intestinal malabsorption: Secondary | ICD-10-CM | POA: Diagnosis not present

## 2017-07-07 DIAGNOSIS — R140 Abdominal distension (gaseous): Secondary | ICD-10-CM

## 2017-07-07 NOTE — Progress Notes (Signed)
Timothy Bellows MD, MRCP(U.K) [address]  [place]  [place], [place] [address]  Main: [phone]  Fax: [phone]   Primary Care Physician: Timothy Haven, MD  Primary Gastroenterologist:  Dr. Jonathon Turner   No chief complaint on file.   HPI: Timothy Turner is a 77 y.o. male    Summary of history :  He has been referred for dysphagia. He has previously been evelauted by Dr Timothy Turner GI and was last seen back in 05/2016. Review of the last office note suggests he had treatment for the UES with botox at [facility] , which helped transiently , he felt it made his regurgitation worse. 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 He has been seen by speech therapy in the past . Appears the prior GI doctor and the patient couldn't agree to a treatment plan and no further follow up was offered. The patient had refused a feeding tube. Seen by Dr Timothy Turner in 09/2015 , per last note in 09/2015 , the patient has had aspiration  Since 07/2014 , hoarseness ,reflux, no prior food impactions. Modified barium and barium swallow in the ast had shown aspiration . Vocal cords evaluated previously and was normal . His impression was that he would benefit from speech therapy . He had been seen by [facility] prior to that    Esophageal Manometry (01/10/2015): 1) The LES relaxed appropriately in all swallows 2) Normal peristalsis in all swallows 3) Hypotensive LES resting pressure of 7.2 (normal>10) 4) 4 cm hiatal hernia 90% complete bolus transit total transit time 5.6 secs   EGD (02/18/15): "There were esophageal mucosal changes secondary to establish short segment Barrett's disease present at the gastroesophageal junction. The maximum longitudinal extent of these mucosal changes was 1 cm in length. Mucosa was biopsied with a cold forceps for histology in 4 quadrants. There is a very sharp angulation into the stomach at the level of the GE junction. The  appearance is almost that of a small paraesophageal hernia. The examined esophagus was otherwise normal. Patchy mild inflammation characterized by congestion (edema) and erythema was found in the gastric antrum. Biopsies were taken with a cold forceps for histology. The cardia and gastric fundus were normal on retroflexion. The examined duodenum was normal. The examined esophagus was otherwise normal. IMPRESSION: Esophageal mucosal changes secondary to establish short segment Barrett's disease. Biopsied. Gastritis. Biopsied. Normal examined duodenum.  Stomach biopsies negative for H. pylori, dysplasia, malignancy.  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 Negative for dysplasia and malignancy.  Impression:  Radiographic studies: Barium swallow (01/14/15): FINDINGS: The patient ingested the initial sip of thick barium without apparent difficulty. However, there was free aspiration of the barium into the larynx and upper trachea without elicit station of a cough reflex. Thin barium was then administered without definite additional aspiration. Survey views of the lower esophagus revealed no evidence of stricture. The study was then terminated.  IMPRESSION: Free aspiration of ingested barium into the trachea and esophagus.  ENT evaluation is recommended. A modified barium swallow may be useful as well.  Modified barium swallow (02/04/15): Mild pharyngeal dysphagia characterized by decreased pharyngeal pressure generation, decrease hi low laryngeal movement, incomplete epiglottic inversion, decreased duration and amplitude of the UES opening and mild pharyngeal residue. There was flash laryngeal penetration of liquids (nectar thick and thin). Hawking and swallow maneuver is effective in clearing most pharyngeal residue. The patient does not appear to be at risk  for significant amount of prandial aspiration. The patient was given written and peripheral  teaching regarding the Shaker exercise.   MRI brain (04/18/15): Normal. 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 The brainstem and cerebellum otherwise within normal limits   CT head in 12/2016- pleural nodules, hepatic steatosis  Barium study 03/2016 showed no obstruction but immediate barium aspiration.  Last colonoscopy was in 2013 by Dr Timothy Turner and was advised to return in 5 years.    He saw me on his initial visit on 06/10/17 and transferred care to me as he didn't like his previous doctors. Was seen for dysphagia, bloating, gas    Interval history   06/10/2017-  07/07/2016   06/17/17- colonoscopy - polyp resected 12 mm -adenoma. EGD -few isolated islands of salmon colored mucosa seen which was confirmed barrettes with no dysplasia on biopsy.   Bloating , shortness of breath have resolved. Diarrhea too is better.    Current Outpatient Medications  Medication Sig Dispense Refill  . cholestyramine (QUESTRAN) 4 g packet Take 1 packet (4 g total) by mouth 3 (three) times daily with meals. 60 each 12  . clobetasol (TEMOVATE) 0.05 % cream Apply topically as needed. Reported on 12/03/2015    . clonazePAM (KLONOPIN) 2 MG tablet Take 1 tablet (2 mg total) by mouth 3 (three) times daily as needed (severe muscle spasm). 90 tablet 1  . colesevelam (WELCHOL) 625 MG tablet TAKE 3 TABLETS TWICE DAILY WITH MEALS (NEED TO CONTACT OFFICE TO SCHEDULE FUTURE APPOINTMENT AND REFILLS) 540 tablet 3  . colestipol (COLESTID) 1 G tablet Take 3 tablets (3 g total) by mouth 2 (two) times daily. 540 tablet 3  . escitalopram (LEXAPRO) 10 MG tablet Take 1 tablet (10 mg total) by mouth daily. 90 tablet 3  . esomeprazole (NEXIUM) 40 MG capsule Take 1 capsule (40 mg total) by mouth daily. 90 capsule 3  . ezetimibe (ZETIA) 10 MG tablet TAKE 1 TABLET DAILY 90 tablet 1  . finasteride (PROSCAR) 5 MG tablet Take 5 mg by mouth daily.      . fluticasone (FLONASE) 50 MCG/ACT nasal  spray Place into both nostrils daily as needed for allergies or rhinitis.    [name] levothyroxine (SYNTHROID, LEVOTHROID) 75 MCG tablet TAKE 1 TABLET DAILY 90 tablet 1  . Multiple Vitamins-Minerals (CENTRUM SILVER PO) Take 1 tablet by mouth every evening.     . mupirocin ointment (BACTROBAN) 2 % Apply 1 application topically 2 (two) times daily. 22 g 0  . Red Yeast Rice Extract (RED YEAST RICE PO) Take 1 capsule by mouth 2 (two) times daily.    . Testosterone (AXIRON) 30 MG/ACT SOLN Apply 1 pump under each armpit every night     No current facility-administered medications for this visit.     Allergies as of 07/07/2017 - Review Complete 06/17/2017  Allergen Reaction Noted  . Cymbalta [duloxetine hcl]  08/23/2012  . Nsaids Other (See Comments) 11/07/2015  . Rosuvastatin Other (See Comments) 07/12/2009  . Statins Other (See Comments) 07/12/2009  . Tolmetin Other (See Comments) 11/07/2015    ROS:  General: Negative for anorexia, weight loss, fever, chills, fatigue, weakness. ENT: Negative for hoarseness, difficulty swallowing , nasal congestion. CV: Negative for chest pain, angina, palpitations, dyspnea on exertion, peripheral edema.  Respiratory: Negative for dyspnea at rest, dyspnea on exertion, cough, sputum, wheezing.  GI: See history of present illness. GU:  Negative for dysuria, hematuria, urinary incontinence, urinary frequency, nocturnal urination.  Endo: Negative  for unusual weight change.    Physical Examination:   There were no vitals taken for this visit.  General: Well-nourished, well-developed in no acute distress.  Eyes: No icterus. Conjunctivae pink. Mouth: Oropharyngeal mucosa moist and pink , no lesions erythema or exudate. Lungs: Clear to auscultation bilaterally. Non-labored. Heart: Regular rate and rhythm, no murmurs rubs or gallops.  Abdomen: Bowel sounds are normal, nontender, nondistended, no hepatosplenomegaly or masses, no abdominal bruits or hernia , no  rebound or guarding.   Extremities: No lower extremity edema. No clubbing or deformities. Neuro: Alert and oriented x 3.  Grossly intact. Skin: Warm and dry, no jaundice.   Psych: Alert and cooperative, normal mood and affect.   Imaging Studies: No results found.  Assessment and Plan:   Timothy Turner is a 77 y.o. y/o male here to follow up for gas.bloating and long standing esophageal dysmotility causing dysphagia,barretes esophagus.  At his last visit I felt his predmoninant issue was bile salt diarrhea and bloating causing most of his symptoms. He has responded tremendously to charcoal tablets and questran. Most of his symptoms have resolved. He is still concerned about his weight . I feel should get better over the next few months   Dr Timothy Bellows  MD,MRCP [facility]) Follow up in 3 months

## 2017-08-11 ENCOUNTER — Ambulatory Visit: Payer: Medicare Other | Admitting: Gastroenterology

## 2017-08-26 ENCOUNTER — Ambulatory Visit (INDEPENDENT_AMBULATORY_CARE_PROVIDER_SITE_OTHER): Payer: Medicare Other | Admitting: Family Medicine

## 2017-08-26 ENCOUNTER — Encounter: Payer: Self-pay | Admitting: Family Medicine

## 2017-08-26 ENCOUNTER — Ambulatory Visit (INDEPENDENT_AMBULATORY_CARE_PROVIDER_SITE_OTHER): Payer: Medicare Other

## 2017-08-26 VITALS — BP 104/64 | HR 57 | Temp 98.3°F | Ht 72.0 in | Wt 166.8 lb

## 2017-08-26 VITALS — BP 104/64 | HR 57 | Temp 98.3°F | Resp 15 | Ht 72.0 in | Wt 166.8 lb

## 2017-08-26 DIAGNOSIS — J300 Vasomotor rhinitis: Secondary | ICD-10-CM | POA: Diagnosis not present

## 2017-08-26 DIAGNOSIS — R0609 Other forms of dyspnea: Secondary | ICD-10-CM | POA: Diagnosis not present

## 2017-08-26 DIAGNOSIS — E039 Hypothyroidism, unspecified: Secondary | ICD-10-CM | POA: Diagnosis not present

## 2017-08-26 DIAGNOSIS — R1312 Dysphagia, oropharyngeal phase: Secondary | ICD-10-CM | POA: Diagnosis not present

## 2017-08-26 DIAGNOSIS — R195 Other fecal abnormalities: Secondary | ICD-10-CM

## 2017-08-26 DIAGNOSIS — Z0000 Encounter for general adult medical examination without abnormal findings: Secondary | ICD-10-CM

## 2017-08-26 DIAGNOSIS — E7849 Other hyperlipidemia: Secondary | ICD-10-CM

## 2017-08-26 DIAGNOSIS — J310 Chronic rhinitis: Secondary | ICD-10-CM | POA: Insufficient documentation

## 2017-08-26 LAB — LIPID PANEL
CHOLESTEROL: 177 mg/dL (ref 0–200)
HDL: 45.1 mg/dL (ref >39.00)
LDL CALC: 112 mg/dL — AB (ref 0–99)
NonHDL: 131.51
Total CHOL/HDL Ratio: 4
Triglycerides: 97 mg/dL (ref 0.0–149.0)
VLDL: 19.4 mg/dL (ref 0.0–40.0)

## 2017-08-26 LAB — COMPREHENSIVE METABOLIC PANEL
ALBUMIN: 4.2 g/dL (ref 3.5–5.2)
ALT: 11 U/L (ref 0–53)
AST: 16 U/L (ref 0–37)
Alkaline Phosphatase: 72 U/L (ref 39–117)
BUN: 20 mg/dL (ref 6–23)
CALCIUM: 10 mg/dL (ref 8.4–10.5)
CHLORIDE: 103 meq/L (ref 96–112)
CO2: 29 mEq/L (ref 19–32)
Creatinine, Ser: 1.48 mg/dL (ref 0.40–1.50)
GFR: 49.34 mL/min — AB (ref >60.00)
Glucose, Bld: 91 mg/dL (ref 70–99)
POTASSIUM: 4.3 meq/L (ref 3.5–5.1)
SODIUM: 139 meq/L (ref 135–145)
Total Bilirubin: 0.6 mg/dL (ref 0.2–1.2)
Total Protein: 6.9 g/dL (ref 6.0–8.3)

## 2017-08-26 LAB — TSH: TSH: 2.35 u[IU]/mL (ref 0.35–4.50)

## 2017-08-26 MED ORDER — AZELASTINE HCL 0.1 % NA SOLN: 2.0000 | Freq: Two times a day (BID) | NASAL | 6 refills | Status: DC

## 2017-08-26 NOTE — Assessment & Plan Note (Signed)
Evaluated by GI.  Likely related to bile salt induced diarrhea.  These GI symptoms have resolved with cholestyramine and activated charcoal.  He will continue to monitor and follow with GI.

## 2017-08-26 NOTE — Patient Instructions (Addendum)
Nice to see you. We will check labs today and contact you with the results. We will try astelin for your runny nose.

## 2017-08-26 NOTE — Assessment & Plan Note (Signed)
Check TSH.  Continue Synthroid. 

## 2017-08-26 NOTE — Patient Instructions (Addendum)
  Timothy Turner , Thank you for taking time to come for your Medicare Wellness Visit. I appreciate your ongoing commitment to your health goals. Please review the following plan we discussed and let me know if I can assist you in the future.   Follow up with Dr. Caryl Bis as needed.    Bring a copy of your [place] and/or Living Will to be scanned into chart.  Have a great day!  These are the goals we discussed: Goals    . Maintain Healthy Lifestyle      Exercise Healthy pureed diet  Thickened liquids       This is a list of the screening recommended for you and due dates:  Health Maintenance  Topic Date Due  . Tetanus Vaccine  02/09/2022  . Colon Cancer Screening  06/18/2027  . Flu Shot  Completed  . Pneumonia vaccines  Completed

## 2017-08-26 NOTE — Assessment & Plan Note (Signed)
Check lipid panel.  Continue red yeast rice and Zetia.

## 2017-08-26 NOTE — Progress Notes (Signed)
Subjective:   Timothy Turner is a 77 y.o. male who presents for Medicare Annual/Subsequent preventive examination.  Review of Systems:  No ROS.  Medicare Wellness Visit. Additional risk factors are reflected in the social history. Cardiac Risk Factors include: advanced age (>30men, >14 women);hypertension;male gender     Objective:    Vitals: BP 104/64 (BP Location: Left Arm, Patient Position: Sitting, Cuff Size: Normal)   Pulse (!) 57   Temp 98.3 F (36.8 C) (Oral)   Resp 15   Ht 6' (1.829 m)   Wt 166 lb 12.8 oz (75.7 kg)   SpO2 96%   BMI 22.62 kg/m   Body mass index is 22.62 kg/m.  Advanced Directives 08/26/2017 06/17/2017 08/26/2016 08/19/2016 07/29/2016 02/17/2016 02/12/2016  Does Patient Have a Medical Advance Directive? Yes Yes Yes Yes Yes Yes Yes  Type of Paramedic of [place];Living will [place];Living will [place];Living will [place];Living will Healthcare Power of [place];Living will  Does patient want to make changes to medical advance directive? No - Patient declined - No - Patient declined No - Patient declined - - -  Copy of [place] in Chart? No - copy requested No - copy requested No - copy requested Yes Yes Yes Yes    Tobacco Social History   Tobacco Use  Smoking Status Former Smoker  . Packs/day: 1.00  . Years: 30.00  . Pack years: 30.00  . Types: Cigarettes  . Last attempt to quit: 06/22/2004  . Years since quitting: 13.1  Smokeless Tobacco Never Used     Counseling given: Not Answered   Clinical Intake:  Pre-visit preparation completed: Yes  Pain : No/denies pain     Nutritional Status: BMI of 19-24  Normal Diabetes: No  How often do you need to have someone help you when you read instructions, pamphlets, or other written materials from your doctor or pharmacy?: 1 - Never  Interpreter Needed?: No       Past Medical History:  Diagnosis Date  . Aspiration precautions    uses thicken in liquids.  Eats pureed food.  . Barrett esophagus   . Depression   . Gallstones   . GERD (gastroesophageal reflux disease)   . Heart murmur    History of  . Hemorrhoids   . Hypertension   . Hypothyroidism   . Mitral valve disease   . Myalgia    Multiple  . Neuromuscular disorder ([place])   . PAD (peripheral artery disease) (H[facility])    lower legs  . Pain syndrome, chronic    Severe  . Pneumonia    three times, last time >10 years ago  . Psoriasis   . Rheumatic heart valve incompetence   . Shortness of breath dyspnea   . Thyroiditis    Past Surgical History:  Procedure Laterality Date  . BACK SURGERY    . CARDIAC CATHETERIZATION N/A 02/22/2015   Procedure: Left Heart Cath and Coronary Angiography;  Surgeon: Minna Merritts, MD;  Location: [facility];  Service: Cardiovascular;  Laterality: N/A;  . CATARACT EXTRACTION W/PHACO Left 07/29/2016   Procedure: CATARACT EXTRACTION PHACO AND INTRAOCULAR LENS PLACEMENT (IOC)  Left eye IVA Topical;  Surgeon: Leandrew Koyanagi, MD;  Location: [facility];  Service: Ophthalmology;  Laterality: Left;  . CATARACT EXTRACTION W/PHACO Right 08/19/2016   Procedure: CATARACT EXTRACTION PHACO AND INTRAOCULAR LENS PLACEMENT (IOC) right  TORIC;  Surgeon: Leandrew Koyanagi, MD;  Location: [facility];  Service: Ophthalmology;  Laterality: Right;  prefers early morning  . CHOLECYSTECTOMY  08/03/13  . COLONOSCOPY  03-08-12   Dr Donnella Sham  . COLONOSCOPY WITH PROPOFOL N/A 06/17/2017   Procedure: COLONOSCOPY WITH PROPOFOL;  Surgeon: [name], MD;  [facility];  Service: Gastroenterology;  Laterality: N/A;  . DIRECT LARYNGOSCOPY N/A 02/17/2016   Procedure: DIRECT LARYNGOSCOPY;  Surgeon: Jodi Marble, MD;  Location: [facility];  Service: ENT;  Laterality: N/A;  . ESOPHAGEAL MANOMETRY N/A 02/27/2015   Procedure: ESOPHAGEAL  MANOMETRY (EM);  Surgeon: Lollie Sails, MD;  Location[facility] ENDOSCOPY;  Service: Endoscopy;  Laterality: N/A;  . ESOPHAGOGASTRODUODENOSCOPY (EGD) WITH PROPOFOL N/A 02/18/2015   Procedure: ESOPHAGOGASTRODUODENOSCOPY (EGD) WITH PROPOFOL;  Surgeon: Lollie Sails, MD;  Location[facility] ENDOSCOPY;  Service: Endoscopy;  Laterality: N/A;  . ESOPHAGOGASTRODUODENOSCOPY (EGD) WITH PROPOFOL N/A 06/17/2017   Procedure: ESOPHAGOGASTRODUODENOSCOPY (EGD) WITH PROPOFOL;  Surgeon: [name], MD;  Location: [facility] ENDOSCOPY;  Service: Gastroenterology;  Laterality: N/A;  . ESOPHAGOSCOPY W/ BOTOX INJECTION N/A 02/17/2016   Procedure: ESOPHAGOSCOPY WITH BOTOX INJECTION  ;  Surgeon: Jodi Marble, MD;  Location: [facility];  Service: ENT;  Laterality: N/A;  . HEMORROIDECTOMY    . UPPER GI ENDOSCOPY  03-06-2013   Dr Donnella Sham   Family History  Problem Relation Age of Onset  . Heart failure Mother   . Heart failure Father   . Emphysema Father        smoked  . Cancer Sister   . Aneurysm Sister        Brain   Social History   Socioeconomic History  . Marital status: Married    Spouse name: None  . Number of children: 2  . Years of education: None  . Highest education level: None  Social Needs  . Financial resource strain: Not hard at all  . Food insecurity - worry: Never true  . Food insecurity - inability: Never true  . Transportation needs - medical: No  . Transportation needs - non-medical: No  Occupational History  . 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 Occupation: Disabled  Tobacco Use  . Smoking status: Former Smoker    Packs/day: 1.00    Years: 30.00    Pack years: 30.00    Types: Cigarettes    Last attempt to quit: 06/22/2004    Years since quitting: 13.1  . Smokeless tobacco: Never Used  Substance and Sexual Activity  . Alcohol use: No    Alcohol/week: 0.0 oz  . Drug use: No  . Sexual activity: No  Other Topics Concern  . None  Social  History Narrative   Drinks 4-5 cups of coffee a day and 3-4 cans of soda a day.    Outpatient Encounter Medications as of 08/26/2017  Medication Sig  . cholestyramine (QUESTRAN) 4 g packet Take 1 packet (4 g total) by mouth 3 (three) times daily with meals.  . clobetasol (TEMOVATE) 0.05 % cream Apply topically as needed. Reported on 12/03/2015  . clonazePAM (KLONOPIN) 2 MG tablet Take 1 tablet (2 mg total) by mouth 3 (three) times daily as needed (severe muscle spasm).  . colesevelam (WELCHOL) 625 MG tablet TAKE 3 TABLETS TWICE DAILY WITH MEALS (NEED TO CONTACT OFFICE TO SCHEDULE FUTURE APPOINTMENT AND REFILLS)  . colestipol (COLESTID) 1 G tablet Take 3 tablets (3 g total) by mouth 2 (two) times daily.  [name] escitalopram (  LEXAPRO) 10 MG tablet Take 1 tablet (10 mg total) by mouth daily.  [name] esomeprazole (NEXIUM) 40 MG capsule Take 1 capsule (40 mg total) by mouth daily.  [name] ezetimibe (ZETIA) 10 MG tablet TAKE 1 TABLET DAILY  . finasteride (PROSCAR) 5 MG tablet Take 5 mg by mouth daily.    . fluticasone (FLONASE) 50 MCG/ACT nasal spray Place into both nostrils daily as needed for allergies or rhinitis.  [name] levothyroxine (SYNTHROID, LEVOTHROID) 75 MCG tablet TAKE 1 TABLET DAILY  . Multiple Vitamins-Minerals (CENTRUM SILVER PO) Take 1 tablet by mouth every evening.   . mupirocin ointment (BACTROBAN) 2 % Apply 1 application topically 2 (two) times daily.  . Red Yeast Rice Extract (RED YEAST RICE PO) Take 1 capsule by mouth 2 (two) times daily.  . Testosterone (AXIRON) 30 MG/ACT SOLN Apply 1 pump under each armpit every night   No facility-administered encounter medications on file as of 08/26/2017.     Activities of Daily Living In your present state of health, do you have any difficulty performing the following activities: 08/26/2017 08/26/2016  Hearing? N N  Vision? N N  Difficulty concentrating or making decisions? N N  Walking or climbing stairs? N Y  Comment - SOB on exertion  Dressing or bathing?  N N  Doing errands, shopping? N N  Preparing Food and eating ? N N  Using the Toilet? N N  In the past six months, have you accidently leaked urine? N N  Do you have problems with loss of bowel control? N N  Managing your Medications? N N  Managing your Finances? Y N  Comment Wife manages the housekeeping -  Housekeeping or managing your Housekeeping? Tempie Donning  Comment Housekeeping assists Housekeeper and wife assist  Some recent data might be hidden    Patient Care Team: Leone Haven, MD as PCP - General (Family Medicine) Bary Castilla, Forest Gleason, MD (General Surgery) Jackolyn Confer, MD (Internal Medicine)   Assessment:   This is a routine wellness examination for [place].  The goal of the wellness visit is to assist the patient how to close the gaps in care and create a preventative care plan for the patient.   The roster of all physicians providing medical care to patient is listed in the Snapshot section of the chart.  Osteoporosis risk reviewed.    Safety issues reviewed; Smoke and carbon monoxide detectors in the home. No firearms or firearms locked in a safe within the home. Wears seatbelts when driving or riding with others. No violence in the home.  They do not have excessive sun exposure.  Discussed the need for sun protection: hats, long sleeves and the use of sunscreen if there is significant sun exposure.  Patient is alert, normal appearance, oriented to person/place/and time. Correctly identified the president of the [place] and recalls of 3/3 words.  Performs simple calculations and can read correct time from watch face. Displays appropriate judgement.  No new identified risk were noted.  No failures at ADL's or IADL's.    BMI- discussed the importance of a healthy diet, water intake and the benefits of aerobic exercise. Educational material provided.   24 hour diet recall: Puree diet, thickened liquids.  No issues.    Dental- dentures  Eye- Visual acuity not  assessed per patient preference since they have regular follow up with the ophthalmologist.  Wears corrective lenses when reading.    Sleep patterns- Sleeps 6 hours at night.  Wakes feeling rested.  Health maintenance gaps- closed.  Exercise Activities and Dietary recommendations    Goals    . Maintain Healthy Lifestyle      Exercise Healthy pureed diet  Thickened liquids       Fall Risk Fall Risk  08/26/2017 08/26/2016 12/18/2015 11/12/2015 11/12/2014  Falls in the past year? No No No Yes No  Injury with Fall? - - - No -   Depression Screen PHQ 2/9 Scores 08/26/2017 08/26/2016 12/18/2015 11/12/2015  PHQ - 2 Score 0 1 0 0  PHQ- 9 Score - 2 - -    Cognitive Function MMSE - Mini Mental State Exam 08/26/2017 08/26/2016  Orientation to time 5 5  Orientation to Place 5 5  Registration 3 3  Attention/ Calculation 5 5  Recall 3 3  Language- name 2 objects 2 2  Language- repeat 1 1  Language- follow 3 step command 3 3  Language- read & follow direction 1 1  Write a sentence 1 1  Copy design 1 1  Total score 30 30        Immunization History  Administered Date(s) Administered  . Influenza Split 03/12/2011, 03/14/2012  . Influenza, High Dose Seasonal PF 02/28/2015, 03/25/2016, 04/30/2017  . Influenza,inj,Quad PF,6+ Mos 03/31/2013, 04/24/2014  . Pneumococcal Conjugate-13 12/19/2013  . Pneumococcal Polysaccharide-23 08/12/2011  . Tdap 02/10/2012  . Zoster 02/17/2003    Screening Tests Health Maintenance  Topic Date Due  . TETANUS/TDAP  02/09/2022  . COLONOSCOPY  06/18/2027  . INFLUENZA VACCINE  Completed  . PNA vac Low Risk Adult  Completed      Plan:    End of life planning; Advance aging; Advanced directives discussed. Copy of current HCPOA/Living Will requested.    I have personally reviewed and noted the following in the patient's chart:   . Medical and social history . Use of alcohol, tobacco or illicit drugs  . Current medications and supplements . Functional  ability and status . Nutritional status . Physical activity . Advanced directives . List of other physicians . Hospitalizations, surgeries, and ER visits in previous 12 months . Vitals . Screenings to include cognitive, depression, and falls . Referrals and appointments  In addition, I have reviewed and discussed with patient certain preventive protocols, quality metrics, and best practice recommendations. A written personalized care plan for preventive services as well as general preventive health recommendations were provided to patient.     Varney Biles, LPN  0/07/4095

## 2017-08-26 NOTE — Assessment & Plan Note (Signed)
Chronic issue.  No obvious cause found with the exception of possible issues with innervation of his esophagus versus possible remote stroke contributing.  He has been extensively evaluated regarding this.  I offered referral for second opinion from neurology as well as referral to speech therapy though he declined these at this time.  He opts to monitor.

## 2017-08-26 NOTE — Assessment & Plan Note (Signed)
Patient reports this has resolved with treatment of his GI issues.  He will monitor for recurrence.

## 2017-08-26 NOTE — Assessment & Plan Note (Signed)
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  He has not responded to treatment thus far.  We will trial Astelin.

## 2017-08-26 NOTE — Progress Notes (Signed)
[name], MD Phone: [phone]  Timothy Turner is a 77 y.o. male who presents today for f/u.  HYPOTHYROIDISM Disease Monitoring Weight changes: stable  Skin Changes: no Heat/Cold intolerance: some cold chronically  Medication Monitoring Compliance:  Taking synthroid   Last TSH:   Lab Results  Component Value Date   TSH 1.51 06/02/2017   HYPERLIPIDEMIA Symptoms Chest pain on exertion:  no   Leg claudication:   no Medications: Compliance- taking red yeast rice and zetia   Patient saw GI for his stool issues as well as his swallowing issues.  He started on cholestyramine and charcoal.  His diarrhea has resolved.  His breathing has improved significantly and he states he was advised it was related to a gas bubble that was causing issues with lung expansion.  He notes no abdominal pain.  He reports those symptoms have resolved.  Patient continues to have some swallowing issues. This is stable.  He has seen multiple GI physicians and ENT physicians as well as neurology with little insight into what is causing this.  He does note some sinus drainage which causes some gagging.  He has tried [facility] and multiple treatments for that as well with little benefit.    Social History   Tobacco Use  Smoking Status Former Smoker  . Packs/day: 1.00  . Years: 30.00  . Pack years: 30.00  . Types: Cigarettes  . Last attempt to quit: 06/22/2004  . Years since quitting: 13.1  Smokeless Tobacco Never Used     ROS see history of present illness  Objective  Physical Exam Vitals:   08/26/17 1018  BP: 104/64  Pulse: (!) 57  Temp: 98.3 F (36.8 C)  SpO2: 96%    BP Readings from Last 3 Encounters:  08/26/17 104/64  08/26/17 104/64  07/07/17 115/75   Wt Readings from Last 3 Encounters:  08/26/17 166 lb 12.8 oz (75.7 kg)  08/26/17 166 lb 12.8 oz (75.7 kg)  07/07/17 163 lb (73.9 kg)    Physical Exam  Constitutional: No distress.  Cardiovascular: Normal rate, regular  rhythm and normal heart sounds.  Pulmonary/Chest: Effort normal and breath sounds normal.  Abdominal: Soft. Bowel sounds are normal. He exhibits no distension. There is no tenderness. There is no rebound and no guarding.  Musculoskeletal: He exhibits no edema.  Neurological: He is alert. Gait normal.  Skin: Skin is warm and dry. He is not diaphoretic.     Assessment/Plan: Please see individual problem list.  Dysphagia, oropharyngeal phase Chronic issue.  No obvious cause found with the exception of possible issues with innervation of his esophagus versus possible remote stroke contributing.  He has been extensively evaluated regarding this.  I offered referral for second opinion from neurology as well as referral to speech therapy though he declined these at this time.  He opts to monitor.  Hypothyroidism Check TSH.  Continue Synthroid.  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  Continue red yeast rice and Zetia.  Clay-colored stools Evaluated by GI.  Likely related to bile salt induced diarrhea.  These GI symptoms have resolved with cholestyramine and activated charcoal.  He will continue to monitor and follow with GI.  Dyspnea on exertion Patient reports this has resolved with treatment of his GI issues.  He will monitor for recurrence.  Rhinitis Suspect nonallergic rhinitis.  He has not responded to treatment thus far.  We will trial Astelin.   Orders Placed This Encounter  Procedures  . Lipid panel  .  Comp Met (CMET)  . TSH    Meds ordered this encounter  Medications  . DISCONTD: azelastine (ASTELIN) 0.1 % nasal spray    Sig: Place 2 sprays into both nostrils 2 (two) times daily. Use in each nostril as directed    Dispense:  30 mL    Refill:  6  . azelastine (ASTELIN) 0.1 % nasal spray    Sig: Place 2 sprays into both nostrils 2 (two) times daily. Use in each nostril as directed    Dispense:  30 mL    Refill:  Ke[place],  MD [place]

## 2017-08-27 ENCOUNTER — Telehealth: Payer: Self-pay

## 2017-08-27 DIAGNOSIS — N179 Acute kidney failure, unspecified: Secondary | ICD-10-CM

## 2017-08-27 NOTE — Telephone Encounter (Signed)
-----   Message from Leone Haven, MD sent at 08/26/2017  4:23 PM EST ----- Please let the patient know that his cholesterol is slightly improved from prior.  It is not at goal though he has not been able to tolerate statins previously.  We could try to get him set up with Repatha given his prior stroke I think he would qualify for this.  If he is willing to do Repatha which is an injectable medication every 2 weeks we could consider trying to get him set up with this.  His kidney function is slightly worse than prior.  I would suggest rechecking this in a week or 2.  You can place an order for a BMET with a diagnosis of acute kidney injury.  Thanks.

## 2017-09-03 ENCOUNTER — Other Ambulatory Visit (INDEPENDENT_AMBULATORY_CARE_PROVIDER_SITE_OTHER): Payer: Medicare Other

## 2017-09-03 DIAGNOSIS — N179 Acute kidney failure, unspecified: Secondary | ICD-10-CM | POA: Diagnosis not present

## 2017-09-03 LAB — BASIC METABOLIC PANEL
BUN: 21 mg/dL (ref 6–23)
CHLORIDE: 106 meq/L (ref 96–112)
CO2: 31 meq/L (ref 19–32)
Calcium: 9.6 mg/dL (ref 8.4–10.5)
Creatinine, Ser: 1.52 mg/dL — ABNORMAL HIGH (ref 0.40–1.50)
GFR: 47.84 mL/min — ABNORMAL LOW (ref >60.00)
Glucose, Bld: 115 mg/dL — ABNORMAL HIGH (ref 70–99)
Potassium: 3.9 mEq/L (ref 3.5–5.1)
Sodium: 142 mEq/L (ref 135–145)

## 2017-09-06 ENCOUNTER — Other Ambulatory Visit: Payer: Self-pay

## 2017-09-06 DIAGNOSIS — M791 Myalgia, unspecified site: Secondary | ICD-10-CM

## 2017-09-06 MED ORDER — CLONAZEPAM 2 MG PO TABS: 2.0000 mg | ORAL_TABLET | Freq: Three times a day (TID) | ORAL | 1 refills | Status: DC | PRN

## 2017-09-06 NOTE — Telephone Encounter (Signed)
Last OV 08/26/17 last filled 05/27/17 90 1rf

## 2017-09-07 ENCOUNTER — Telehealth: Payer: Self-pay | Admitting: Pharmacist

## 2017-09-07 MED ORDER — ALIROCUMAB 75 MG/ML ~~LOC~~ SOPN
75.0000 mg | PEN_INJECTOR | SUBCUTANEOUS | 11 refills | Status: DC
Start: 2017-09-07 — End: 2017-10-26

## 2017-09-07 MED ORDER — CLONAZEPAM 2 MG PO TABS: 2.0000 mg | ORAL_TABLET | Freq: Three times a day (TID) | ORAL | 1 refills | Status: DC | PRN

## 2017-09-07 NOTE — Addendum Note (Signed)
Addended by: Deirdre Pippins E on: 09/07/2017 03:11 PM   Modules accepted: Orders

## 2017-09-07 NOTE — Addendum Note (Signed)
Addended by: Leone Haven on: 09/07/2017 06:43 PM   Modules accepted: Orders

## 2017-09-07 NOTE — Telephone Encounter (Signed)
Sent to pharmacy 

## 2017-09-07 NOTE — Addendum Note (Signed)
Addended by: Caryl Bis Elianah Karis G on: 09/07/2017 05:07 PM   Modules accepted: Orders

## 2017-09-07 NOTE — Telephone Encounter (Addendum)
Called patient and discussed plan to attempt approval for Repatha via [facility] at the request of Dr. Caryl Bis (patient's primary care provider). Patient agreeable and verbalizes understanding. Reviews history of intolerances to crestor (chronic myalgias x 7 years), niacin (flushing) and confirms adherence to zetia and red yeast rice. States he is unable to take any statins due to history of chronic myalgias. Patient confirms he does not take colestipol (denies taking, unsure why), colesevelam(unable to swallow), or cholestyramine (thinks he is taking only for constipation) anymore.  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   Lipid Panel on zetia and red yeast rice for >3 months    Component Value Date/Time   CHOL 177 08/26/2017 1057   TRIG 97.0 08/26/2017 1057   HDL 45.10 08/26/2017 1057   CHOLHDL 4 08/26/2017 1057   VLDL 19.4 08/26/2017 1057   LDLCALC 112 (H) 08/26/2017 1057    Submitted PA request via cover my meds 09/07/2017 for Praluent 75 mg q14 days.   Will follow up once PA request approved or denied for next steps.  Carlean Jews, Pharm.D., BCPS PGY2 Ambulatory Care Pharmacy Resident Phone: [phone]

## 2017-09-07 NOTE — Telephone Encounter (Signed)
Prescription signed.

## 2017-09-07 NOTE — Telephone Encounter (Signed)
Please send in °

## 2017-09-07 NOTE — Telephone Encounter (Signed)
Rec'd determination from plan ([facility]) that prior authorization not required. Please send Rx for Praluent 75 mg subq q14 days (qty= 2 pens for 28 day supply) to [facility]. This is preferred pharmacy for [facility].   Patient's [employer] insurance information here:  ID: [id number] BIN: [id number] PCN: [id number] Group: [id number]  Once copay is known, we will call patient to determine if this is affordable. If not affordable, will pursue patient assistance pending income assessment.  Carlean Jews, Pharm.D., BCPS PGY2 Ambulatory Care Pharmacy Resident Phone: [phone]

## 2017-09-07 NOTE — Addendum Note (Signed)
Addended by: Juanda Chance on: 09/07/2017 10:17 AM   Modules accepted: Orders

## 2017-09-11 ENCOUNTER — Other Ambulatory Visit: Payer: Self-pay | Admitting: Family Medicine

## 2017-09-11 DIAGNOSIS — N179 Acute kidney failure, unspecified: Secondary | ICD-10-CM

## 2017-09-13 NOTE — Telephone Encounter (Signed)
Per [place] representative - Praluent 75 mg q14 days cost is $39.33/one month supply on insurance. Outstanding issues include prior authorizatoin needed [phone]).

## 2017-09-13 NOTE — Telephone Encounter (Signed)
Verbal PA completed with [employer] [facility].  Approval number = [id number] Approval dates = 08/14/17 - 09/12/2020  [facility] # = [id number] They report estimated copay of $39/month  Called patient to relay this information. He states that this copayment amount is affordable. Explained that [facility] will be calling him tomorrow to set up shipment. Patient verbalized understanding. Offered teaching for the pen when it arrives. Patient to come to doctor's office or his local pharmacy for teaching when he gets the pen.   Carlean Jews, Pharm.D., BCPS PGY2 Ambulatory Care Pharmacy Resident Phone: [phone]

## 2017-09-17 ENCOUNTER — Telehealth: Payer: Self-pay

## 2017-09-17 MED ORDER — EZETIMIBE 10 MG PO TABS: 10.0000 mg | ORAL_TABLET | Freq: Every day | ORAL | 1 refills | Status: DC

## 2017-09-23 NOTE — Telephone Encounter (Signed)
Patient's pharmacy is calling to report the insurance requires the patient to use an alternative first unless there are special circumstances for the patient. When calling back use [phone].  Medication Alternatives: Rosouvastatin, Atrovastatin, Simvastatin

## 2017-09-24 ENCOUNTER — Telehealth: Payer: Self-pay | Admitting: Family Medicine

## 2017-09-24 NOTE — Telephone Encounter (Signed)
Did you do a prior auth for this by any chance.

## 2017-09-24 NOTE — Telephone Encounter (Signed)
Copied from [place] [phone] [id number]. Topic: Quick Communication - See Telephone Encounter >> Sep 24, 2017  1:42 PM Hewitt Shorts wrote: CRM for notification. See Telephone encounter for: 09/24/17. [facility] is callng stating that they need to have an answer today in regards to the alternative for ezetimibe so that insurance will cover  Jphone number is [phone]  ref # [id number]

## 2017-09-24 NOTE — Telephone Encounter (Signed)
Prior Auth Completed and approved.  Approved;Review Type:Prior Auth;Coverage Start Date:08/25/2017;Coverage End Date:09/24/2018;</DIV>

## 2017-09-24 NOTE — Telephone Encounter (Signed)
Copied from [place] [phone]. Topic: Quick Communication - See Telephone Encounter >> Sep 24, 2017  1:42 PM Hewitt Shorts wrote: CRM for notification. See Telephone encounter for: 09/24/17. [facility] is call[facility] stating that they need to have an answer today in regards to the alternative for ezetimibe so that insurance will cover  Jphone number is [phone]  ref # [id number]

## 2017-09-24 NOTE — Telephone Encounter (Signed)
FYI Patient states he is scheduled with Dr.Burn at [facility] neurology

## 2017-09-24 NOTE — Telephone Encounter (Signed)
I believe he has tried Crestor.  He has had fairly significant myalgias related to statins in the past.  Please check with the patient to see which ones he has tried.  I also believe he has been on the Zetia for some time now and this was a refill.  Thanks.

## 2017-09-24 NOTE — Telephone Encounter (Signed)
fyi

## 2017-10-07 ENCOUNTER — Other Ambulatory Visit (INDEPENDENT_AMBULATORY_CARE_PROVIDER_SITE_OTHER): Payer: Medicare Other

## 2017-10-07 DIAGNOSIS — N179 Acute kidney failure, unspecified: Secondary | ICD-10-CM

## 2017-10-07 LAB — BASIC METABOLIC PANEL
BUN: 23 mg/dL (ref 6–23)
CHLORIDE: 105 meq/L (ref 96–112)
CO2: 31 meq/L (ref 19–32)
CREATININE: 1.56 mg/dL — AB (ref 0.40–1.50)
Calcium: 9.5 mg/dL (ref 8.4–10.5)
GFR: 46.42 mL/min — ABNORMAL LOW (ref >60.00)
Glucose, Bld: 103 mg/dL — ABNORMAL HIGH (ref 70–99)
Potassium: 4.4 mEq/L (ref 3.5–5.1)
Sodium: 140 mEq/L (ref 135–145)

## 2017-10-13 ENCOUNTER — Encounter: Payer: Self-pay | Admitting: Gastroenterology

## 2017-10-13 ENCOUNTER — Ambulatory Visit (INDEPENDENT_AMBULATORY_CARE_PROVIDER_SITE_OTHER): Payer: Medicare Other | Admitting: Gastroenterology

## 2017-10-13 VITALS — BP 109/67 | HR 64 | Ht 72.0 in | Wt 172.2 lb

## 2017-10-13 DIAGNOSIS — R140 Abdominal distension (gaseous): Secondary | ICD-10-CM

## 2017-10-13 NOTE — Progress Notes (Signed)
Timothy Bellows MD, MRCP(U.K) [address]  [place]  [place], [place] [address]  Main: [phone]  Fax: [phone]   Primary Care Physician: Timothy Haven, MD  Primary Gastroenterologist:  Dr. Jonathon Turner   No chief complaint on file.   HPI: Timothy Turner is a 77 y.o. male   Summary of history :  He has been referred for dysphagia. He has previously been evelauted by Dr Timothy Turner GI and was last seen back in 05/2016. Review of the last office note suggests he had treatment for the UES with botox at [facility] , which helped transiently , he felt it made his regurgitation worse. 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 He has been seen by speech therapy in the past . Appears the prior GI doctor and the patient couldn't agree to a treatment plan and no further follow up was offered. The patient had refused a feeding tube. Seen by Dr Timothy Turner in 09/2015 , per last note in 09/2015 , the patient has had aspiration Since 07/2014 , hoarseness ,reflux, no prior food impactions. Modified barium and barium swallow in the ast had shown aspiration . Vocal cords evaluated previously and was normal . His impression was that he would benefit from speech therapy . He had been seen by [facility] prior to that    Esophageal Manometry (01/10/2015): 1) The LES relaxed appropriately in all swallows 2) Normal peristalsis in all swallows 3) Hypotensive LES resting pressure of 7.2 (normal>10) 4) 4 cm hiatal hernia 90% complete bolus transit total transit time 5.6 secs   EGD (02/18/15): "There were esophageal mucosal changes secondary to establish short segment Barrett's disease present at the gastroesophageal junction. The maximum longitudinal extent of these mucosal changes was 1 cm in length. Mucosa was biopsied with a cold forceps for histology in 4 quadrants. There is a very sharp angulation into the stomach at the level of the GE junction. The  appearance is almost that of a small paraesophageal hernia. The examined esophagus was otherwise normal. Patchy mild inflammation characterized by congestion (edema) and erythema was found in the gastric antrum. Biopsies were taken with a cold forceps for histology. The cardia and gastric fundus were normal on retroflexion. The examined duodenum was normal. The examined esophagus was otherwise normal. IMPRESSION: Esophageal mucosal changes secondary to establish short segment Barrett's disease. Biopsied. Gastritis. Biopsied. Normal examined duodenum.  Stomach biopsies negative for H. pylori, dysplasia, malignancy.  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 Negative for dysplasia and malignancy.  Impression:  Radiographic studies: Barium swallow (01/14/15): FINDINGS: The patient ingested the initial sip of thick barium without apparent difficulty. However, there was free aspiration of the barium into the larynx and upper trachea without elicit station of a cough reflex. Thin barium was then administered without definite additional aspiration. Survey views of the lower esophagus revealed no evidence of stricture. The study was then terminated.  IMPRESSION: Free aspiration of ingested barium into the trachea and esophagus.  ENT evaluation is recommended. A modified barium swallow may be useful as well.  Modified barium swallow (02/04/15): Mild pharyngeal dysphagia characterized by decreased pharyngeal pressure generation, decrease hi low laryngeal movement, incomplete epiglottic inversion, decreased duration and amplitude of the UES opening and mild pharyngeal residue. There was flash laryngeal penetration of liquids (nectar thick and thin). Hawking and swallow maneuver is effective in clearing most pharyngeal residue. The patient does not appear to be at risk for significant  amount of prandial aspiration. The patient was given written and peripheral  teaching regarding the Shaker exercise.   MRI brain (04/18/15): Normal. 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 The brainstem and cerebellum otherwise within normal limits   CT head in 12/2016- pleural nodules, hepatic steatosis  Barium study 03/2016 showed no obstruction but immediate barium aspiration. Last colonoscopy was in 2013 by Dr Timothy Turner and was advised to return in 5 years.    06/17/17- colonoscopy - polyp resected 12 mm -adenoma. EGD -few isolated islands of salmon colored mucosa seen which was confirmed barrettes with no dysplasia on biopsy.    He saw me on his initial visit on 06/10/17 and transferred care to me as he didn't like his previous doctors. Was seen for dysphagia, bloating, gas    Interval history  07/07/2016 -10/13/2017   Gained 9 lbs weight . Feels great since his last visit. No complaints . Walks a few miles a day . He has been eating well and has no issues.   Bloating , shortness of breath have resolved. Diarrhea has resolved.    Current Outpatient Medications  Medication Sig Dispense Refill  . Alirocumab (PRALUENT) 75 MG/ML SOPN Inject 75 mg into the skin every 14 (fourteen) days. 2 pen 11  . azelastine (ASTELIN) 0.1 % nasal spray Place 2 sprays into both nostrils 2 (two) times daily. Use in each nostril as directed 30 mL 6  . cholestyramine (QUESTRAN) 4 g packet Take 1 packet (4 g total) by mouth 3 (three) times daily with meals. 60 each 12  . clobetasol (TEMOVATE) 0.05 % cream Apply topically as needed. Reported on 12/03/2015    . clonazePAM (KLONOPIN) 2 MG tablet Take 1 tablet (2 mg total) by mouth 3 (three) times daily as needed (severe muscle spasm). 90 tablet 1  . colesevelam (WELCHOL) 625 MG tablet TAKE 3 TABLETS TWICE DAILY WITH MEALS (NEED TO CONTACT OFFICE TO SCHEDULE FUTURE APPOINTMENT AND REFILLS) 540 tablet 3  . colestipol (COLESTID) 1 G tablet Take 3 tablets (3 g total) by mouth 2 (two) times daily.  540 tablet 3  . escitalopram (LEXAPRO) 10 MG tablet Take 1 tablet (10 mg total) by mouth daily. 90 tablet 3  . esomeprazole (NEXIUM) 40 MG capsule Take 1 capsule (40 mg total) by mouth daily. 90 capsule 3  . ezetimibe (ZETIA) 10 MG tablet Take 1 tablet (10 mg total) by mouth daily. 90 tablet 1  . finasteride (PROSCAR) 5 MG tablet Take 5 mg by mouth daily.      . fluticasone (FLONASE) 50 MCG/ACT nasal spray Place into both nostrils daily as needed for allergies or rhinitis.    [name] levothyroxine (SYNTHROID, LEVOTHROID) 75 MCG tablet TAKE 1 TABLET DAILY 90 tablet 1  . Multiple Vitamins-Minerals (CENTRUM SILVER PO) Take 1 tablet by mouth every evening.     . mupirocin ointment (BACTROBAN) 2 % Apply 1 application topically 2 (two) times daily. 22 g 0  . Red Yeast Rice Extract (RED YEAST RICE PO) Take 1 capsule by mouth 2 (two) times daily.    . Testosterone (AXIRON) 30 MG/ACT SOLN Apply 1 pump under each armpit every night     No current facility-administered medications for this visit.     Allergies as of 10/13/2017 - Review Complete 08/26/2017  Allergen Reaction Noted  . Duloxetine  11/23/2013  . Nsaids Other (See Comments) 11/07/2015  . Rosuvastatin Other (See Comments) 07/12/2009  . Statins Other (See Comments) 07/12/2009  .  Tolmetin Other (See Comments) 11/07/2015    ROS:  General: Negative for anorexia, weight loss, fever, chills, fatigue, weakness. ENT: Negative for hoarseness, difficulty swallowing , nasal congestion. CV: Negative for chest pain, angina, palpitations, dyspnea on exertion, peripheral edema.  Respiratory: Negative for dyspnea at rest, dyspnea on exertion, cough, sputum, wheezing.  GI: See history of present illness. GU:  Negative for dysuria, hematuria, urinary incontinence, urinary frequency, nocturnal urination.  Endo: Negative for unusual weight change.    Physical Examination:   There were no vitals taken for this visit.  General: Well-nourished,  well-developed in no acute distress.  Eyes: No icterus. Conjunctivae pink. Mouth: Oropharyngeal mucosa moist and pink , no lesions erythema or exudate. Lungs: Clear to auscultation bilaterally. Non-labored. Heart: Regular rate and rhythm, no murmurs rubs or gallops.  Abdomen: Bowel sounds are normal, nontender, nondistended, no hepatosplenomegaly or masses, no abdominal bruits or hernia , no rebound or guarding.   Extremities: No lower extremity edema. No clubbing or deformities. Neuro: Alert and oriented x 3.  Grossly intact. Skin: Warm and dry, no jaundice.   Psych: Alert and cooperative, normal mood and affect.   Imaging Studies: No results found.  Assessment and Plan:   Timothy Turner is a 77 y.o. y/o male  here to follow up for gas.bloating and long standing esophageal dysmotility causing dysphagia. All his symptoms have resolved after commencing on activated charcoal tablets, I suspect he had a lot of gas and bloating which caused all his abdominal discomfort. He has gained weight since last visit and is doing well .    Dr Timothy Bellows  MD,MRCP [facility]) Follow up PRN

## 2017-10-19 ENCOUNTER — Telehealth: Payer: Self-pay | Admitting: Pharmacist

## 2017-10-19 NOTE — Telephone Encounter (Signed)
Patient calls back, after reviewing his income and the income requirements for the patient assistance program, patient will not qualify. Patient states he is unwilling to spend this much money on medications and states that he will not be able to refill it in subsequent months.   Will route this note to Dr. Caryl Bis. Will cancel appointment with me.   Carlean Jews, Pharm.D., BCPS PGY2 Ambulatory Care Pharmacy Resident Phone: [phone]

## 2017-10-19 NOTE — Telephone Encounter (Signed)
Pt calls to inquire if it's OK to give the Praluent injection 3 days late. Advised patient that this is OK and to give q14 days (on Tuesdays) going forward.   Patient also states that the cost of the medication has increased to >$100/month for Praleunt - suspect that patient is in the donut hole. Scheduled appointment with me on 11/01/17 to fill out patient assistance application for Pralent.   Carlean Jews, Pharm.D., BCPS PGY2 Ambulatory Care Pharmacy Resident Phone: [phone]

## 2017-10-26 ENCOUNTER — Other Ambulatory Visit: Payer: Self-pay | Admitting: Pharmacist

## 2017-10-26 MED ORDER — ALIROCUMAB 75 MG/ML ~~LOC~~ SOPN: 75.0000 mg | PEN_INJECTOR | SUBCUTANEOUS | 3 refills | Status: DC

## 2017-10-26 NOTE — Telephone Encounter (Addendum)
Patient calls in to request 90 day supply be sent to [facility] for [place]. Per patient, he spoke to someone there that told him a 90-day supply would be $39.95. Patient has previously been in donut hole.   Will route to Dr. Caryl Bis. Order pending.   Carlean Jews, Pharm.D., BCPS PGY2 Ambulatory Care Pharmacy Resident Phone: [phone]

## 2017-10-28 ENCOUNTER — Telehealth: Payer: Self-pay | Admitting: Cardiovascular Disease

## 2017-10-28 NOTE — Telephone Encounter (Signed)
Stanford Breed speech pathologist calling Would like to recommend an intervention to patient - respirtory muscle strength training but she will need approval before proceeding Please contact Katie to discuss at [phone] She will be out of office til Monday 5/13

## 2017-10-28 NOTE — Telephone Encounter (Signed)
No answer/No voicemail for this number. We have not seen this patient since 2016 here in our office. She will need to reach out to patients pulmonary physician and/or primary care provider for this order. Will reach out to her first part of next week to see if I can reach her.

## 2017-10-29 ENCOUNTER — Encounter: Payer: Self-pay | Admitting: Family Medicine

## 2017-10-29 ENCOUNTER — Ambulatory Visit (INDEPENDENT_AMBULATORY_CARE_PROVIDER_SITE_OTHER): Payer: Medicare Other

## 2017-10-29 ENCOUNTER — Other Ambulatory Visit: Payer: Self-pay

## 2017-10-29 ENCOUNTER — Ambulatory Visit (INDEPENDENT_AMBULATORY_CARE_PROVIDER_SITE_OTHER): Payer: Medicare Other | Admitting: Family Medicine

## 2017-10-29 ENCOUNTER — Telehealth: Payer: Self-pay | Admitting: Family Medicine

## 2017-10-29 VITALS — BP 126/70 | HR 69 | Temp 98.0°F | Wt 175.6 lb

## 2017-10-29 DIAGNOSIS — M79652 Pain in left thigh: Secondary | ICD-10-CM

## 2017-10-29 DIAGNOSIS — M79662 Pain in left lower leg: Secondary | ICD-10-CM

## 2017-10-29 DIAGNOSIS — M25562 Pain in left knee: Secondary | ICD-10-CM

## 2017-10-29 NOTE — Telephone Encounter (Signed)
Patient scheduled and was advised to be here by 4.

## 2017-10-29 NOTE — Telephone Encounter (Signed)
Copied from [place] [phone]. Topic: Inquiry >> Oct 29, 2017 12:26 PM Neva Seat wrote: Pt wanting to be worked in today w/ Dr. Caryl Bis. Pt just needing leg to be looked at or [place].  Twisted it. Pt wants a call back.

## 2017-10-29 NOTE — Patient Instructions (Signed)
Nice to see you. We will get x-rays today and contact you with the results. If your pain worsens significantly please get looked at again.

## 2017-10-29 NOTE — Assessment & Plan Note (Signed)
Status post mechanical fall.  Neurologically intact.  Shoulder exam normal.  I advised him not to try to kick anything again.  Suspect possible meniscal injury though will obtain x-rays to rule out fracture.  He notes some pain extending up his thigh and down his lower leg and thus we will x-ray his femur as well as tibia and fibula and his left knee.  Determine further evaluation once x-rays are back.  He has a pain medication which he has been taking at home and he will contact us to let us know what medication this is.

## 2017-10-29 NOTE — Telephone Encounter (Signed)
Patient fell in yard two weeks ago, patient went to kick a brick on retainer wall missed and fell backwards and caught leg under his body.Sitting pain level is at one, walking up driveway pain level at 5 , coming down driveway pain level is a 6, patient refused UC and appointment for next available stated if he could not be worked in today that he is very disappointed and would contact [facility] and let them know.

## 2017-10-29 NOTE — Telephone Encounter (Signed)
Looking at my schedule it appears we could place him in at 4:15.  He needs to show up by 4 pm.

## 2017-10-29 NOTE — Progress Notes (Signed)
[name], MD Phone: [phone]  Timothy Turner is a 77 y.o. male who presents today for same-day visit.  Patient reports 2 weeks ago he tried to kick a block back into a wall with his right foot and then fell behind himself and his left knee ended up behind him.  He has noted pain since then.  He did land on his left shoulder and hit his head though had no head injury or loss of consciousness.  He has had no headaches.  His shoulder was sore though that has improved.  He has less range of motion on flexion and extension.  Hurts if he steps and turns.  It pops on him.  No locking or catching.  Social History   Tobacco Use  Smoking Status Former Smoker  . Packs/day: 1.00  . Years: 30.00  . Pack years: 30.00  . Types: Cigarettes  . Last attempt to quit: 06/22/2004  . Years since quitting: 13.3  Smokeless Tobacco Never Used     ROS see history of present illness  Objective  Physical Exam Vitals:   10/29/17 1605  BP: 126/70  Pulse: 69  Temp: 98 F (36.7 C)  SpO2: 96%    BP Readings from Last 3 Encounters:  10/29/17 126/70  10/13/17 109/67  08/26/17 104/64   Wt Readings from Last 3 Encounters:  10/29/17 175 lb 9.6 oz (79.7 kg)  10/13/17 172 lb 3.2 oz (78.1 kg)  08/26/17 166 lb 12.8 oz (75.7 kg)    Physical Exam  Constitutional: No distress.  HENT:  Head: Normocephalic and atraumatic.  Cardiovascular: Normal rate, regular rhythm and normal heart sounds.  Pulmonary/Chest: Effort normal and breath sounds normal.  Musculoskeletal: He exhibits no edema.  Full range of motion bilateral shoulders with no tenderness or discomfort on range of motion, slightly decreased extension and flexion of left knee, no tenderness of the left knee, no warmth or erythema or swelling noted in the left knee, discomfort on McMurray's though no palpable clicking, no ACL or PCL laxity, right knee with full range of motion with no tenderness, warmth, erythema, or swelling, no ACL or  PCL laxity, negative McMurray's  Neurological: He is alert.  CN 2-12 intact, 5/5 strength in bilateral biceps, triceps, grip, quads, hamstrings, plantar and dorsiflexion, sensation to light touch intact in bilateral UE and LE  Skin: Skin is warm and dry. He is not diaphoretic.     Assessment/Plan: Please see individual problem list.  Left knee pain Status post mechanical fall.  Neurologically intact.  Shoulder exam normal.  I advised him not to try to kick anything again.  Suspect possible meniscal injury though will obtain x-rays to rule out fracture.  He notes some pain extending up his thigh and down his lower leg and thus we will x-ray his femur as well as tibia and fibula and his left knee.  Determine further evaluation once x-rays are back.  He has a pain medication which he has been taking at home and he will contact us to let us know what medication this is.    Orders Placed This Encounter  Procedures  . DG FEMUR MIN 2 VIEWS LEFT    Standing Status:   Future    Number of Occurrences:   1    Standing Expiration Date:   12/30/2018    Order Specific Question:   Reason for Exam (SYMPTOM  OR DIAGNOSIS REQUIRED)    Answer:   fall 2 weeks ago with left knee and  thigh pain and lower leg pain    Order Specific Question:   Preferred imaging location?    Answer:   [facility] Specific Question:   Radiology Contrast Protocol - do NOT remove file path    Answer:   \\[email or web address]s.pdf  . DG Knee Complete 4 Views Left    Standing Status:   Future    Number of Occurrences:   1    Standing Expiration Date:   12/30/2018    Order Specific Question:   Reason for Exam (SYMPTOM  OR DIAGNOSIS REQUIRED)    Answer:   fall 2 weeks ago with left knee and thigh pain and lower leg pain    Order Specific Question:   Preferred imaging location?    Answer:   [facility] Specific Question:   Radiology Contrast Protocol - do  NOT remove file path    Answer:   \\[email or web address]\[email or web address]s.pdf  . DG Tibia/Fibula Left    Standing Status:   Future    Number of Occurrences:   1    Standing Expiration Date:   12/30/2018    Order Specific Question:   Reason for Exam (SYMPTOM  OR DIAGNOSIS REQUIRED)    Answer:   fall 2 weeks ago with left knee and thigh pain and lower leg pain    Order Specific Question:   Preferred imaging location?    Answer:   [facility] Specific Question:   Radiology Contrast Protocol - do NOT remove file path    Answer:   \\[email or web address]\[email or web address]s.pdf    No orders of the defined types were placed in this encounter.    [name], MD [place]

## 2017-10-29 NOTE — Telephone Encounter (Signed)
Please advise 

## 2017-11-01 ENCOUNTER — Ambulatory Visit: Payer: Medicare Other | Admitting: Pharmacist

## 2017-11-02 NOTE — Telephone Encounter (Signed)
I called and spoke with Joellen Jersey, speech pathologist. She states she is wanting to do strength muscle training with the patient that may require some valsalva maneuvers.  I advised Joellen Jersey that we have not seen the patient since 02/2015 and that we cannot really speak to what she is asking at this time. I have advised her that per a 03/2015 phone note, the patient did not feel that follow up with necessary and he would call back to schedule if needed. She is also aware that if needed, the patient may call back to schedule a follow up if needed so we can address what she is asking. Per Joellen Jersey, she feels the patient will be ok and she will proceed- she will stop if needed depending on the patient's symptoms.

## 2017-11-11 ENCOUNTER — Inpatient Hospital Stay
Admission: EM | Admit: 2017-11-11 | Discharge: 2017-11-14 | DRG: 175 | Disposition: A | Discharge status: Home / Self Care | Payer: Medicare Other | Attending: Internal Medicine | Admitting: Internal Medicine

## 2017-11-11 ENCOUNTER — Emergency Department: Payer: Medicare Other

## 2017-11-11 ENCOUNTER — Encounter: Payer: Self-pay | Admitting: Family

## 2017-11-11 ENCOUNTER — Encounter: Payer: Self-pay | Admitting: Emergency Medicine

## 2017-11-11 ENCOUNTER — Telehealth: Payer: Self-pay | Admitting: *Deleted

## 2017-11-11 ENCOUNTER — Ambulatory Visit (INDEPENDENT_AMBULATORY_CARE_PROVIDER_SITE_OTHER): Payer: Medicare Other | Admitting: Family

## 2017-11-11 ENCOUNTER — Telehealth: Payer: Self-pay

## 2017-11-11 ENCOUNTER — Other Ambulatory Visit: Payer: Self-pay

## 2017-11-11 ENCOUNTER — Ambulatory Visit (INDEPENDENT_AMBULATORY_CARE_PROVIDER_SITE_OTHER): Payer: Medicare Other

## 2017-11-11 VITALS — BP 130/72 | HR 95 | Temp 99.3°F | Resp 16 | Wt 178.5 lb

## 2017-11-11 DIAGNOSIS — Z9049 Acquired absence of other specified parts of digestive tract: Secondary | ICD-10-CM | POA: Diagnosis not present

## 2017-11-11 DIAGNOSIS — I82409 Acute embolism and thrombosis of unspecified deep veins of unspecified lower extremity: Secondary | ICD-10-CM

## 2017-11-11 DIAGNOSIS — I340 Nonrheumatic mitral (valve) insufficiency: Secondary | ICD-10-CM | POA: Diagnosis present

## 2017-11-11 DIAGNOSIS — K5900 Constipation, unspecified: Secondary | ICD-10-CM | POA: Diagnosis present

## 2017-11-11 DIAGNOSIS — I2699 Other pulmonary embolism without acute cor pulmonale: Secondary | ICD-10-CM | POA: Diagnosis not present

## 2017-11-11 DIAGNOSIS — J440 Chronic obstructive pulmonary disease with acute lower respiratory infection: Secondary | ICD-10-CM | POA: Diagnosis present

## 2017-11-11 DIAGNOSIS — Z8701 Personal history of pneumonia (recurrent): Secondary | ICD-10-CM | POA: Diagnosis not present

## 2017-11-11 DIAGNOSIS — L409 Psoriasis, unspecified: Secondary | ICD-10-CM | POA: Diagnosis present

## 2017-11-11 DIAGNOSIS — R4702 Dysphasia: Secondary | ICD-10-CM | POA: Diagnosis present

## 2017-11-11 DIAGNOSIS — K2270 Barrett's esophagus without dysplasia: Secondary | ICD-10-CM | POA: Diagnosis present

## 2017-11-11 DIAGNOSIS — Z79899 Other long term (current) drug therapy: Secondary | ICD-10-CM

## 2017-11-11 DIAGNOSIS — E785 Hyperlipidemia, unspecified: Secondary | ICD-10-CM | POA: Diagnosis present

## 2017-11-11 DIAGNOSIS — F419 Anxiety disorder, unspecified: Secondary | ICD-10-CM | POA: Diagnosis present

## 2017-11-11 DIAGNOSIS — Z888 Allergy status to other drugs, medicaments and biological substances status: Secondary | ICD-10-CM

## 2017-11-11 DIAGNOSIS — I739 Peripheral vascular disease, unspecified: Secondary | ICD-10-CM | POA: Diagnosis present

## 2017-11-11 DIAGNOSIS — G894 Chronic pain syndrome: Secondary | ICD-10-CM | POA: Diagnosis present

## 2017-11-11 DIAGNOSIS — Z886 Allergy status to analgesic agent status: Secondary | ICD-10-CM | POA: Diagnosis not present

## 2017-11-11 DIAGNOSIS — E039 Hypothyroidism, unspecified: Secondary | ICD-10-CM | POA: Diagnosis present

## 2017-11-11 DIAGNOSIS — I824Z2 Acute embolism and thrombosis of unspecified deep veins of left distal lower extremity: Secondary | ICD-10-CM | POA: Diagnosis present

## 2017-11-11 DIAGNOSIS — N400 Enlarged prostate without lower urinary tract symptoms: Secondary | ICD-10-CM | POA: Diagnosis present

## 2017-11-11 DIAGNOSIS — J189 Pneumonia, unspecified organism: Secondary | ICD-10-CM

## 2017-11-11 DIAGNOSIS — Z825 Family history of asthma and other chronic lower respiratory diseases: Secondary | ICD-10-CM | POA: Diagnosis not present

## 2017-11-11 DIAGNOSIS — K219 Gastro-esophageal reflux disease without esophagitis: Secondary | ICD-10-CM | POA: Diagnosis present

## 2017-11-11 DIAGNOSIS — R0781 Pleurodynia: Secondary | ICD-10-CM

## 2017-11-11 DIAGNOSIS — I82401 Acute embolism and thrombosis of unspecified deep veins of right lower extremity: Secondary | ICD-10-CM | POA: Diagnosis not present

## 2017-11-11 DIAGNOSIS — M791 Myalgia, unspecified site: Secondary | ICD-10-CM | POA: Diagnosis not present

## 2017-11-11 DIAGNOSIS — J181 Lobar pneumonia, unspecified organism: Secondary | ICD-10-CM | POA: Diagnosis present

## 2017-11-11 DIAGNOSIS — F329 Major depressive disorder, single episode, unspecified: Secondary | ICD-10-CM | POA: Diagnosis present

## 2017-11-11 DIAGNOSIS — Z8249 Family history of ischemic heart disease and other diseases of the circulatory system: Secondary | ICD-10-CM

## 2017-11-11 DIAGNOSIS — I10 Essential (primary) hypertension: Secondary | ICD-10-CM | POA: Diagnosis present

## 2017-11-11 DIAGNOSIS — A419 Sepsis, unspecified organism: Secondary | ICD-10-CM

## 2017-11-11 DIAGNOSIS — R1012 Left upper quadrant pain: Secondary | ICD-10-CM

## 2017-11-11 DIAGNOSIS — R079 Chest pain, unspecified: Secondary | ICD-10-CM | POA: Diagnosis not present

## 2017-11-11 DIAGNOSIS — Z87891 Personal history of nicotine dependence: Secondary | ICD-10-CM

## 2017-11-11 DIAGNOSIS — R109 Unspecified abdominal pain: Secondary | ICD-10-CM | POA: Diagnosis not present

## 2017-11-11 DIAGNOSIS — Z79890 Hormone replacement therapy (postmenopausal): Secondary | ICD-10-CM

## 2017-11-11 LAB — COMPREHENSIVE METABOLIC PANEL
ALK PHOS: 69 U/L (ref 39–117)
ALT: 15 U/L (ref 0–53)
ALT: 19 U/L (ref 17–63)
AST: 20 U/L (ref 0–37)
AST: 27 U/L (ref 15–41)
Albumin: 4.1 g/dL (ref 3.5–5.2)
Albumin: 4.2 g/dL (ref 3.5–5.0)
Alkaline Phosphatase: 69 U/L (ref 38–126)
Anion gap: 10 (ref 5–15)
BILIRUBIN TOTAL: 0.6 mg/dL (ref 0.2–1.2)
BUN: 22 mg/dL (ref 6–23)
BUN: 21 mg/dL — ABNORMAL HIGH (ref 6–20)
CALCIUM: 9.9 mg/dL (ref 8.4–10.5)
CALCIUM: 9.5 mg/dL (ref 8.9–10.3)
CHLORIDE: 101 mmol/L (ref 101–111)
CO2: 30 mEq/L (ref 19–32)
CO2: 28 mmol/L (ref 22–32)
CREATININE: 1.42 mg/dL — AB (ref 0.61–1.24)
Chloride: 102 mEq/L (ref 96–112)
Creatinine, Ser: 1.54 mg/dL — ABNORMAL HIGH (ref 0.40–1.50)
GFR, EST AFRICAN AMERICAN: 55 mL/min — AB (ref >60)
GFR, EST NON AFRICAN AMERICAN: 47 mL/min — AB (ref >60)
GFR: 47.1 mL/min — ABNORMAL LOW (ref >60.00)
Glucose, Bld: 155 mg/dL — ABNORMAL HIGH (ref 70–99)
Glucose, Bld: 123 mg/dL — ABNORMAL HIGH (ref 65–99)
POTASSIUM: 4.4 meq/L (ref 3.5–5.1)
Potassium: 4.4 mmol/L (ref 3.5–5.1)
Sodium: 138 mEq/L (ref 135–145)
Sodium: 139 mmol/L (ref 135–145)
TOTAL PROTEIN: 7 g/dL (ref 6.0–8.3)
Total Bilirubin: 0.5 mg/dL (ref 0.3–1.2)
Total Protein: 7.2 g/dL (ref 6.5–8.1)

## 2017-11-11 LAB — URINALYSIS, COMPLETE (UACMP) WITH MICROSCOPIC
BACTERIA UA: NONE SEEN
Bilirubin Urine: NEGATIVE
Glucose, UA: NEGATIVE mg/dL
Hgb urine dipstick: NEGATIVE
Ketones, ur: NEGATIVE mg/dL
Leukocytes, UA: NEGATIVE
Nitrite: NEGATIVE
PROTEIN: NEGATIVE mg/dL
SPECIFIC GRAVITY, URINE: 1.014 (ref 1.005–1.030)
SQUAMOUS EPITHELIAL / LPF: NONE SEEN (ref 0–5)
WBC UA: NONE SEEN WBC/hpf (ref 0–5)
pH: 6 (ref 5.0–8.0)

## 2017-11-11 LAB — CBC WITH DIFFERENTIAL/PLATELET
Basophils Absolute: 0.1 10*3/uL (ref 0.0–0.1)
Basophils Absolute: 0.1 10*3/uL (ref 0–0.1)
Basophils Relative: 0.5 % (ref 0.0–3.0)
Basophils Relative: 1 %
EOS PCT: 1.2 % (ref 0.0–5.0)
Eosinophils Absolute: 0.2 10*3/uL (ref 0.0–0.7)
Eosinophils Absolute: 0.1 10*3/uL (ref 0–0.7)
Eosinophils Relative: 1 %
HCT: 44.8 % (ref 39.0–52.0)
HEMATOCRIT: 44.9 % (ref 40.0–52.0)
HEMOGLOBIN: 14.8 g/dL (ref 13.0–18.0)
Hemoglobin: 14.9 g/dL (ref 13.0–17.0)
LYMPHS ABS: 2.5 10*3/uL (ref 0.7–4.0)
Lymphocytes Relative: 18.5 % (ref 12.0–46.0)
Lymphocytes Relative: 16 %
Lymphs Abs: 2.3 10*3/uL (ref 1.0–3.6)
MCH: 29 pg (ref 26.0–34.0)
MCHC: 33.4 g/dL (ref 30.0–36.0)
MCHC: 33 g/dL (ref 32.0–36.0)
MCV: 88.4 fl (ref 78.0–100.0)
MCV: 87.9 fL (ref 80.0–100.0)
MONO ABS: 1.7 10*3/uL — AB (ref 0.1–1.0)
MONOS PCT: 12.9 % — AB (ref 3.0–12.0)
MONOS PCT: 16 %
Monocytes Absolute: 2.3 10*3/uL — ABNORMAL HIGH (ref 0.2–1.0)
NEUTROS ABS: 8.9 10*3/uL — AB (ref 1.4–7.7)
NEUTROS ABS: 9.7 10*3/uL — AB (ref 1.4–6.5)
NEUTROS PCT: 66.9 % (ref 43.0–77.0)
Neutrophils Relative %: 66 %
PLATELETS: 239 10*3/uL (ref 150.0–400.0)
Platelets: 223 10*3/uL (ref 150–440)
RBC: 5.07 Mil/uL (ref 4.22–5.81)
RBC: 5.11 MIL/uL (ref 4.40–5.90)
RDW: 13.8 % (ref 11.5–15.5)
RDW: 13.7 % (ref 11.5–14.5)
WBC: 13.3 10*3/uL — ABNORMAL HIGH (ref 4.0–10.5)
WBC: 14.5 10*3/uL — AB (ref 3.8–10.6)

## 2017-11-11 LAB — PROTIME-INR
INR: 1.09
PROTHROMBIN TIME: 14 s (ref 11.4–15.2)

## 2017-11-11 LAB — LACTIC ACID, PLASMA
LACTIC ACID, VENOUS: 1 mmol/L (ref 0.5–1.9)
Lactic Acid, Venous: 1.7 mmol/L (ref 0.5–1.9)

## 2017-11-11 LAB — CK: Total CK: 96 U/L (ref 7–232)

## 2017-11-11 LAB — LIPASE, BLOOD: Lipase: 23 U/L (ref 11–51)

## 2017-11-11 LAB — LIPASE: LIPASE: 16 U/L (ref 11.0–59.0)

## 2017-11-11 MED ORDER — LACTATED RINGERS IV BOLUS
1000.0000 mL | Freq: Once | INTRAVENOUS | Status: AC
  Administered 2017-11-11: 1000 mL via INTRAVENOUS
  Filled 2017-11-11: qty 1000

## 2017-11-11 MED ORDER — HEPARIN (PORCINE) IN NACL 100-0.45 UNIT/ML-% IJ SOLN
1350.0000 [IU]/h | INTRAMUSCULAR | Status: DC
  Administered 2017-11-11: 1400 [IU]/h via INTRAVENOUS
  Administered 2017-11-12: 1300 [IU]/h via INTRAVENOUS
  Administered 2017-11-13 – 2017-11-14 (×2): 1350 [IU]/h via INTRAVENOUS
  Filled 2017-11-11 (×5): qty 250

## 2017-11-11 MED ORDER — ACETAMINOPHEN 500 MG PO TABS
1000.0000 mg | ORAL_TABLET | Freq: Once | ORAL | Status: AC
  Administered 2017-11-11: 1000 mg via ORAL

## 2017-11-11 MED ORDER — ONDANSETRON HCL 4 MG/2ML IJ SOLN: 4.0000 mg | Freq: Four times a day (QID) | INTRAMUSCULAR | Status: DC | PRN

## 2017-11-11 MED ORDER — FINASTERIDE 5 MG PO TABS
5.0000 mg | ORAL_TABLET | Freq: Every day | ORAL | Status: DC
  Administered 2017-11-12 – 2017-11-14 (×3): 5 mg via ORAL
  Filled 2017-11-11 (×3): qty 1

## 2017-11-11 MED ORDER — FAMOTIDINE IN NACL 20-0.9 MG/50ML-% IV SOLN
20.0000 mg | Freq: Once | INTRAVENOUS | Status: AC
  Administered 2017-11-11: 20 mg via INTRAVENOUS
  Filled 2017-11-11: qty 50

## 2017-11-11 MED ORDER — CLONAZEPAM 1 MG PO TABS
2.0000 mg | ORAL_TABLET | Freq: Three times a day (TID) | ORAL | Status: DC | PRN
  Administered 2017-11-11 – 2017-11-12 (×2): 2 mg via ORAL
  Filled 2017-11-11 (×3): qty 2

## 2017-11-11 MED ORDER — SODIUM CHLORIDE 0.9 % IV SOLN
500.0000 mg | INTRAVENOUS | Status: DC
  Administered 2017-11-11: 500 mg via INTRAVENOUS
  Filled 2017-11-11 (×2): qty 500

## 2017-11-11 MED ORDER — AZELASTINE HCL 0.1 % NA SOLN
2.0000 | Freq: Two times a day (BID) | NASAL | Status: DC
Start: 2017-11-11 — End: 2017-11-14
  Administered 2017-11-11 – 2017-11-14 (×6): 2 via NASAL
  Filled 2017-11-11: qty 30

## 2017-11-11 MED ORDER — ACETAMINOPHEN 650 MG RE SUPP: 650.0000 mg | Freq: Four times a day (QID) | RECTAL | Status: DC | PRN

## 2017-11-11 MED ORDER — LEVOTHYROXINE SODIUM 50 MCG PO TABS
75.0000 ug | ORAL_TABLET | Freq: Every day | ORAL | Status: DC
  Administered 2017-11-12 – 2017-11-14 (×3): 75 ug via ORAL
  Filled 2017-11-11 (×3): qty 1

## 2017-11-11 MED ORDER — ACETAMINOPHEN 325 MG PO TABS: 650.0000 mg | ORAL_TABLET | Freq: Four times a day (QID) | ORAL | Status: DC | PRN

## 2017-11-11 MED ORDER — ONDANSETRON HCL 4 MG PO TABS: 4.0000 mg | ORAL_TABLET | Freq: Four times a day (QID) | ORAL | Status: DC | PRN

## 2017-11-11 MED ORDER — TRAMADOL HCL 50 MG PO TABS
50.0000 mg | ORAL_TABLET | Freq: Four times a day (QID) | ORAL | Status: DC | PRN
  Administered 2017-11-11 – 2017-11-12 (×4): 50 mg via ORAL
  Filled 2017-11-11 (×4): qty 1

## 2017-11-11 MED ORDER — SODIUM CHLORIDE 0.9 % IV SOLN
2.0000 g | INTRAVENOUS | Status: DC
  Administered 2017-11-11 – 2017-11-13 (×3): 2 g via INTRAVENOUS
  Filled 2017-11-11 (×2): qty 2
  Filled 2017-11-11 (×2): qty 20

## 2017-11-11 MED ORDER — HEPARIN BOLUS VIA INFUSION
5000.0000 [IU] | Freq: Once | INTRAVENOUS | Status: AC
  Administered 2017-11-11: 5000 [IU] via INTRAVENOUS
  Filled 2017-11-11: qty 5000

## 2017-11-11 MED ORDER — ACETAMINOPHEN 500 MG PO TABS
ORAL_TABLET | ORAL | Status: AC
  Filled 2017-11-11: qty 2

## 2017-11-11 MED ORDER — IOPAMIDOL (ISOVUE-370) INJECTION 76%
60.0000 mL | Freq: Once | INTRAVENOUS | Status: AC | PRN
  Administered 2017-11-11: 100 mL via INTRAVENOUS

## 2017-11-11 MED ORDER — IOPAMIDOL (ISOVUE-370) INJECTION 76%: 100.0000 mL | Freq: Once | INTRAVENOUS | Status: DC | PRN

## 2017-11-11 MED ORDER — GARLIC 400 MG PO TBEC: DELAYED_RELEASE_TABLET | Freq: Every day | ORAL | Status: DC

## 2017-11-11 MED ORDER — EZETIMIBE 10 MG PO TABS
10.0000 mg | ORAL_TABLET | Freq: Every day | ORAL | Status: DC
  Administered 2017-11-12 – 2017-11-14 (×3): 10 mg via ORAL
  Filled 2017-11-11 (×3): qty 1

## 2017-11-11 NOTE — Progress Notes (Signed)
Subjective:    Patient ID: Timothy Turner, male    DOB: 1941-04-16, 77 y.o.   MRN: [id number]  HPI  Timothy Turner is a 77 yr old male who presents today with chief complaint of left sided pain "up under my ribs." reports that pain started at 4 PM yesterday evening.  Has intermittent sharp shooting pain if he tries to lay on his left side. If he lays on his right side his symptoms improve.  He reports pain is worse with deep breathing.  Pt report that he had a fall about 5 weeks ago.  He tried to kick a loose brick and missed the brick.  Reports that he fell on his back. He denies cough or fever. Reports that he tried aleve without improvement. Denies nausea/vomitting, constipation or diarrhea.   He also tells me that he has been having muscle pain "all over" and wonders if he should continue his praluent.   Review of Systems    see HPI  Past Medical History:  Diagnosis Date  . Aspiration precautions    uses thicken in liquids.  Eats pureed food.  . Barrett esophagus   . Depression   . Gallstones   . GERD (gastroesophageal reflux disease)   . Heart murmur    History of  . Hemorrhoids   . Hypertension   . Hypothyroidism   . Iron deficiency anemia 02/10/2012  . Mitral valve disease   . Myalgia    Multiple  . Neuromuscular disorder ([place])   . PAD (peripheral artery disease) ([facility])    lower legs  . Pain syndrome, chronic    Severe  . Pneumonia    three times, last time >10 years ago  . Psoriasis   . Rheumatic heart valve incompetence   . Shortness of breath dyspnea   . Thyroiditis      Social History   Socioeconomic History  . Marital status: Married    Spouse name: Not on file  . Number of children: 2  . Years of education: Not on file  . Highest education level: Not on file  Occupational History  . 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 Occupation: Disabled  Social Needs  . Financial resource strain: Not hard at all  . Food  insecurity:    Worry: Never true    Inability: Never true  . Transportation needs:    Medical: No    Non-medical: No  Tobacco Use  . Smoking status: Former Smoker    Packs/day: 1.00    Years: 30.00    Pack years: 30.00    Types: Cigarettes    Last attempt to quit: 06/22/2004    Years since quitting: 13.3  . Smokeless tobacco: Never Used  Substance and Sexual Activity  . Alcohol use: No    Alcohol/week: 0.0 oz  . Drug use: No  . Sexual activity: Never  Lifestyle  . Physical activity:    Days per week: Not on file    Minutes per session: Not on file  . Stress: Not on file  Relationships  . Social connections:    Talks on phone: Not on file    Gets together: Not on file    Attends religious service: Not on file    Active member of club or organization: Not on file    Attends meetings of clubs or organizations: Not on file    Relationship status: Not on file  . Intimate partner violence:  Fear of current or ex partner: Not on file    Emotionally abused: Not on file    Physically abused: Not on file    Forced sexual activity: Not on file  Other Topics Concern  . Not on file  Social History Narrative   Drinks 4-5 cups of coffee a day and 3-4 cans of soda a day.    Past Surgical History:  Procedure Laterality Date  . BACK SURGERY    . CARDIAC CATHETERIZATION N/A 02/22/2015   Procedure: Left Heart Cath and Coronary Angiography;  Surgeon: Minna Merritts, MD;  Location: [place] [facility];  Service: Cardiovascular;  Laterality: N/A;  . CATARACT EXTRACTION W/PHACO Left 07/29/2016   Procedure: CATARACT EXTRACTION PHACO AND INTRAOCULAR LENS PLACEMENT ([facility])  Left eye IVA Topical;  Surgeon: Leandrew Koyanagi, MD;  Location: [place];  Service: Ophthalmology;  Laterality: Left;  . CATARACT EXTRACTION W/PHACO Right 08/19/2016   Procedure: CATARACT EXTRACTION PHACO AND INTRAOCULAR LENS PLACEMENT ([place]) right  TORIC;  Surgeon: Leandrew Koyanagi, MD;  Location: [place];  Service: Ophthalmology;  Laterality: Right;  prefers early morning  . CHOLECYSTECTOMY  08/03/13  . COLONOSCOPY  03-08-12   Dr Donnella Sham  . COLONOSCOPY WITH PROPOFOL N/A 06/17/2017   Procedure: COLONOSCOPY WITH PROPOFOL;  Surgeon: [name], MD;  Location: [facility] ENDOSCOPY;  Service: Gastroenterology;  Laterality: N/A;  . DIRECT LARYNGOSCOPY N/A 02/17/2016   Procedure: DIRECT LARYNGOSCOPY;  Surgeon: Jodi Marble, MD;  Location: [place];  Service: ENT;  Laterality: N/A;  . ESOPHAGEAL MANOMETRY N/A 02/27/2015   Procedure: ESOPHAGEAL MANOMETRY (EM);  Surgeon: Lollie Sails, MD;  Location: [facility] ENDOSCOPY;  Service: Endoscopy;  Laterality: N/A;  . ESOPHAGOGASTRODUODENOSCOPY (EGD) WITH PROPOFOL N/A 02/18/2015   Procedure: ESOPHAGOGASTRODUODENOSCOPY (EGD) WITH PROPOFOL;  Surgeon: Lollie Sails, MD;  Location: [facility] ENDOSCOPY;  Service: Endoscopy;  Laterality: N/A;  . ESOPHAGOGASTRODUODENOSCOPY (EGD) WITH PROPOFOL N/A 06/17/2017   Procedure: ESOPHAGOGASTRODUODENOSCOPY (EGD) WITH PROPOFOL;  Surgeon: [name], MD;  Location: [facility] ENDOSCOPY;  Service: Gastroenterology;  Laterality: N/A;  . ESOPHAGOSCOPY W/ BOTOX INJECTION N/A 02/17/2016   Procedure: ESOPHAGOSCOPY WITH BOTOX INJECTION  ;  Surgeon: Jodi Marble, MD;  Location: [place];  Service: ENT;  Laterality: N/A;  . HEMORROIDECTOMY    . UPPER GI ENDOSCOPY  03-06-2013   Dr Donnella Sham    Family History  Problem Relation Age of Onset  . Heart failure Mother   . Heart failure Father   . Emphysema Father        smoked  . Cancer Sister   . Aneurysm Sister        Brain    Allergies  Allergen Reactions  . Duloxetine     Unable to urinate  . Nsaids Other (See Comments)    CANNOT TAKE NSAIDS BECAUSE OF ESOPHAGUS ISSUES CANNOT TAKE NSAIDS BECAUSE OF ESOPHAGUS ISSUES  . Rosuvastatin Other (See Comments)    SEVERE MUSCLE ACHES  . Statins Other (See Comments)    SEVERE MUSCLE ACHES  . Tolmetin  Other (See Comments)    CANNOT TAKE NSAIDS BECAUSE OF ESOPHAGUS ISSUES    Current Outpatient Medications on File Prior to Visit  Medication Sig Dispense Refill  . Alirocumab (PRALUENT) 75 MG/ML SOPN Inject 75 mg into the skin every 14 (fourteen) days. 6 pen 3  . azelastine (ASTELIN) 0.1 % nasal spray Place 2 sprays into both nostrils 2 (two) times daily. Use in each nostril as directed 30 mL 6  .  cholestyramine (QUESTRAN) 4 g packet Take 1 packet (4 g total) by mouth 3 (three) times daily with meals. 60 each 12  . clobetasol (TEMOVATE) 0.05 % cream Apply topically as needed. Reported on 12/03/2015    . clonazePAM (KLONOPIN) 2 MG tablet Take 1 tablet (2 mg total) by mouth 3 (three) times daily as needed (severe muscle spasm). 90 tablet 1  . colestipol (COLESTID) 1 G tablet Take 3 tablets (3 g total) by mouth 2 (two) times daily. 540 tablet 3  . escitalopram (LEXAPRO) 10 MG tablet Take 1 tablet (10 mg total) by mouth daily. 90 tablet 3  . esomeprazole (NEXIUM) 40 MG capsule Take 1 capsule (40 mg total) by mouth daily. 90 capsule 3  . ezetimibe (ZETIA) 10 MG tablet Take 1 tablet (10 mg total) by mouth daily. 90 tablet 1  . finasteride (PROSCAR) 5 MG tablet Take 5 mg by mouth daily.      . fluticasone (FLONASE) 50 MCG/ACT nasal spray Place into both nostrils daily as needed for allergies or rhinitis.    . Garlic (GARLIQUE PO) Take by mouth daily.    [name] levothyroxine (SYNTHROID, LEVOTHROID) 75 MCG tablet TAKE 1 TABLET DAILY 90 tablet 1  . Multiple Vitamins-Minerals (CENTRUM SILVER PO) Take 1 tablet by mouth every evening.     . mupirocin ointment (BACTROBAN) 2 % Apply 1 application topically 2 (two) times daily. 22 g 0  . Testosterone (AXIRON) 30 MG/ACT SOLN Apply 1 pump under each armpit every night     No current facility-administered medications on file prior to visit.     BP 130/72 (BP Location: Left Arm, Patient Position: Sitting, Cuff Size: Normal)   Pulse 95   Temp 99.3 F (37.4 C)  (Oral)   Resp 16   Wt 178 lb 8 oz (81 kg)   SpO2 97%   BMI 24.21 kg/m    Objective:   Physical Exam  Constitutional: He is oriented to person, place, and time. He appears well-developed and well-nourished. No distress.  HENT:  Head: Normocephalic and atraumatic.  Cardiovascular: Normal rate and regular rhythm.  No murmur heard. Pulmonary/Chest: Effort normal and breath sounds normal. No respiratory distress. He has no wheezes. He has no rales.  Abdominal: Bowel sounds are normal. There is tenderness in the left upper quadrant. There is no rigidity and no guarding.  Musculoskeletal: He exhibits no edema.  + tenderness to palpation overlying left lower anterior rib cage  Neurological: He is alert and oriented to person, place, and time.  Skin: Skin is warm and dry.  Psychiatric: He has a normal mood and affect. His behavior is normal. Thought content normal.          Assessment & Plan:  Rib pain- suspect rib fracture from recent fall, however I am not sure why it just started hurting now.  Check CXR/L rib detail.  Tylenol prn pain.   LUQ pain- will obtain CBC/CMET/lipase to further evaluate.  May be referred pain from the adjacent ribs.   Myalgia- could be related to recent fall. Obtain CK level. Doubt related to praluent, more likely related to recent fall.

## 2017-11-11 NOTE — Telephone Encounter (Signed)
Copied from [place] [id number]. Topic: Quick Communication - See Telephone Encounter >> Nov 11, 2017  2:07 PM Hewitt Shorts wrote: Pt is checking on the xray results  Pt is still in pain  Best number [phone]

## 2017-11-11 NOTE — ED Triage Notes (Signed)
Pt to ED from home c/o LUQ pain that started yesterday gradually and is now severe sharp intermittent pain.  Denies injury, n/v/d, denies radiation.  States walking improves pain, lying flat worsens it and eating worsens.  Pt seen at PCP today and had xray taken but was not told results.

## 2017-11-11 NOTE — H&P (Signed)
Timothy Turner at [place] NAME: Timothy Turner    MR#:  [id number]  DATE OF BIRTH:  Apr 13, 1941  DATE OF ADMISSION:  11/11/2017  PRIMARY CARE PHYSICIAN: Leone Haven, MD   REQUESTING/REFERRING PHYSICIAN: Dr. Brenton Grills  CHIEF COMPLAINT:   Left Sided pleuritic chest pain  HISTORY OF PRESENT ILLNESS:  Timothy Turner  is a 77 y.o. male with a known history of hypertension, hypothyroidism, peripheral vascular disease, anxiety/depression, GERD who presents to the hospital due to left-sided pleuritic chest pain.  Patient says he developed left-sided pleuritic chest pain suddenly yesterday afternoon and has not improved and therefore came to the ER for further evaluation.  Patient also says he is been having some exertional dyspnea.  Patient says about 6 weeks ago he hurt his knee and has not been as active.  He normally walks about a mile to mile and a half every day but he has not been ambulating that much since his knee injury 6 weeks ago.  He denies any leg swelling, calf pain.  He presented to the ER and underwent a CT scan of his chest which was positive for pulmonary embolism.  Patient was also noted to have a pulmonary infarct, hospitalist services were contacted for treatment evaluation.  PAST MEDICAL HISTORY:   Past Medical History:  Diagnosis Date  . Aspiration precautions    uses thicken in liquids.  Eats pureed food.  . Barrett esophagus   . Depression   . Gallstones   . GERD (gastroesophageal reflux disease)   . Heart murmur    History of  . Hemorrhoids   . Hypertension   . Hypothyroidism   . Iron deficiency anemia 02/10/2012  . Mitral valve disease   . Myalgia    Multiple  . Neuromuscular disorder ([place])   . PAD (peripheral artery disease) (HCC)    lower legs  . Pain syndrome, chronic    Severe  . Pneumonia    three times, last time >10 years ago  . Psoriasis   . Rheumatic heart valve incompetence   . Shortness  of breath dyspnea   . Thyroiditis     PAST SURGICAL HISTORY:   Past Surgical History:  Procedure Laterality Date  . BACK SURGERY    . CARDIAC CATHETERIZATION N/A 02/22/2015   Procedure: Left Heart Cath and Coronary Angiography;  Surgeon: Minna Merritts, MD;  Location: [place] [facility];  Service: Cardiovascular;  Laterality: N/A;  . CATARACT EXTRACTION W/PHACO Left 07/29/2016   Procedure: CATARACT EXTRACTION PHACO AND INTRAOCULAR LENS PLACEMENT (IOC)  Left eye IVA Topical;  Surgeon: Leandrew Koyanagi, MD;  Location: [place];  Service: Ophthalmology;  Laterality: Left;  . CATARACT EXTRACTION W/PHACO Right 08/19/2016   Procedure: CATARACT EXTRACTION PHACO AND INTRAOCULAR LENS PLACEMENT ([place]) right  TORIC;  Surgeon: Leandrew Koyanagi, MD;  Location: [place];  Service: Ophthalmology;  Laterality: Right;  prefers early morning  . CHOLECYSTECTOMY  08/03/13  . COLONOSCOPY  03-08-12   Dr Donnella Sham  . COLONOSCOPY WITH PROPOFOL N/A 06/17/2017   Procedure: COLONOSCOPY WITH PROPOFOL;  Surgeon: [name], MD;  Location: [facility] ENDOSCOPY;  Service: Gastroenterology;  Laterality: N/A;  . DIRECT LARYNGOSCOPY N/A 02/17/2016   Procedure: DIRECT LARYNGOSCOPY;  Surgeon: Jodi Marble, MD;  Location: [place];  Service: ENT;  Laterality: N/A;  . ESOPHAGEAL MANOMETRY N/A 02/27/2015   Procedure: ESOPHAGEAL MANOMETRY (EM);  Surgeon: Lollie Sails, MD;  Location: [facility] ENDOSCOPY;  Service: Endoscopy;  Laterality: N/A;  . ESOPHAGOGASTRODUODENOSCOPY (EGD) WITH PROPOFOL N/A 02/18/2015   Procedure: ESOPHAGOGASTRODUODENOSCOPY (EGD) WITH PROPOFOL;  Surgeon: Lollie Sails, MD;  Location: [facility] ENDOSCOPY;  Service: Endoscopy;  Laterality: N/A;  . ESOPHAGOGASTRODUODENOSCOPY (EGD) WITH PROPOFOL N/A 06/17/2017   Procedure: ESOPHAGOGASTRODUODENOSCOPY (EGD) WITH PROPOFOL;  Surgeon: [name], MD;  Location: [facility] ENDOSCOPY;  Service: Gastroenterology;  Laterality: N/A;  .  ESOPHAGOSCOPY W/ BOTOX INJECTION N/A 02/17/2016   Procedure: ESOPHAGOSCOPY WITH BOTOX INJECTION  ;  Surgeon: Jodi Marble, MD;  Location: [place];  Service: ENT;  Laterality: N/A;  . HEMORROIDECTOMY    . UPPER GI ENDOSCOPY  03-06-2013   Dr Donnella Sham    SOCIAL HISTORY:   Social History   Tobacco Use  . Smoking status: Former Smoker    Packs/day: 1.00    Years: 30.00    Pack years: 30.00    Types: Cigarettes    Last attempt to quit: 06/22/2004    Years since quitting: 13.3  . Smokeless tobacco: Never Used  Substance Use Topics  . Alcohol use: No    Alcohol/week: 0.0 oz    FAMILY HISTORY:   Family History  Problem Relation Age of Onset  . Heart failure Mother   . Heart failure Father   . Emphysema Father        smoked  . Cancer Sister   . Aneurysm Sister        Brain    DRUG ALLERGIES:   Allergies  Allergen Reactions  . Duloxetine     Unable to urinate  . Nsaids Other (See Comments)    CANNOT TAKE NSAIDS BECAUSE OF ESOPHAGUS ISSUES CANNOT TAKE NSAIDS BECAUSE OF ESOPHAGUS ISSUES  . Rosuvastatin Other (See Comments)    SEVERE MUSCLE ACHES  . Statins Other (See Comments)    SEVERE MUSCLE ACHES  . Tolmetin Other (See Comments)    CANNOT TAKE NSAIDS BECAUSE OF ESOPHAGUS ISSUES    REVIEW OF SYSTEMS:   Review of Systems  Constitutional: Negative for fever and weight loss.  HENT: Negative for congestion, nosebleeds and tinnitus.   Eyes: Negative for blurred vision, double vision and redness.  Respiratory: Positive for shortness of breath. Negative for cough and hemoptysis.   Cardiovascular: Positive for chest pain (Pleuritic). Negative for orthopnea, leg swelling and PND.  Gastrointestinal: Negative for abdominal pain, diarrhea, melena, nausea and vomiting.  Genitourinary: Negative for dysuria, hematuria and urgency.  Musculoskeletal: Negative for falls and joint pain.  Neurological: Negative for dizziness, tingling, sensory change, focal weakness,  seizures, weakness and headaches.  Endo/Heme/Allergies: Negative for polydipsia. Does not bruise/bleed easily.  Psychiatric/Behavioral: Negative for depression and memory loss. The patient is not nervous/anxious.     MEDICATIONS AT HOME:   Prior to Admission medications   Medication Sig Start Date End Date Taking? Authorizing Provider  Alirocumab (PRALUENT) 75 MG/ML SOPN Inject 75 mg into the skin every 14 (fourteen) days. 10/26/17  Yes Leone Haven, MD  azelastine (ASTELIN) 0.1 % nasal spray Place 2 sprays into both nostrils 2 (two) times daily. Use in each nostril as directed 08/26/17  Yes Leone Haven, MD  clobetasol (TEMOVATE) 0.05 % cream Apply topically as needed. Reported on 12/03/2015   Yes [provider]  clonazePAM (KLONOPIN) 2 MG tablet Take 1 tablet (2 mg total) by mouth 3 (three) times daily as needed (severe muscle spasm). 09/07/17  Yes Leone Haven, MD  ezetimibe (ZETIA) 10 MG tablet Take 1 tablet (10 mg total) by mouth daily.  09/17/17  Yes Leone Haven, MD  finasteride (PROSCAR) 5 MG tablet Take 5 mg by mouth daily.     Yes [provider]  fluticasone (FLONASE) 50 MCG/ACT nasal spray Place into both nostrils daily as needed for allergies or rhinitis.   Yes [provider]  Garlic (GARLIQUE PO) Take by mouth daily.   Yes [provider]  levothyroxine (SYNTHROID, LEVOTHROID) 75 MCG tablet TAKE 1 TABLET DAILY 05/04/17  Yes Leone Haven, MD  Multiple Vitamins-Minerals (CENTRUM SILVER PO) Take 1 tablet by mouth every evening.    Yes [provider]  Testosterone (AXIRON) 30 MG/ACT SOLN Apply 1 pump under each armpit every night   Yes [provider]  cholestyramine (QUESTRAN) 4 g packet Take 1 packet (4 g total) by mouth 3 (three) times daily with meals. Patient not taking: Reported on 11/11/2017 06/10/17   [name], MD  colestipol (COLESTID) 1 G tablet Take 3 tablets (3 g total) by mouth 2 (two) times  daily. Patient not taking: Reported on 11/11/2017 03/19/15   Minna Merritts, MD  escitalopram (LEXAPRO) 10 MG tablet Take 1 tablet (10 mg total) by mouth daily. Patient not taking: Reported on 11/11/2017 11/10/16   Leone Haven, MD  esomeprazole (NEXIUM) 40 MG capsule Take 1 capsule (40 mg total) by mouth daily. Patient not taking: Reported on 11/11/2017 11/12/16   Leone Haven, MD  mupirocin ointment (BACTROBAN) 2 % Apply 1 application topically 2 (two) times daily. Patient not taking: Reported on 11/11/2017 06/02/17   Leone Haven, MD      VITAL SIGNS:  Blood pressure (!) 143/74, pulse 97, temperature (!) 100.5 F (38.1 C), temperature source Oral, resp. rate (!) 21, height 6' (1.829 m), weight 80.7 kg (178 lb), SpO2 93 %.  PHYSICAL EXAMINATION:  Physical Exam  GENERAL:  77 y.o.-year-old patient lying in the bed in no acute distress.  EYES: Pupils equal, round, reactive to light and accommodation. No scleral icterus. Extraocular muscles intact.  HEENT: Head atraumatic, normocephalic. Oropharynx and nasopharynx clear. No oropharyngeal erythema, moist oral mucosa  NECK:  Supple, no jugular venous distention. No thyroid enlargement, no tenderness.  LUNGS: Normal breath sounds bilaterally, no wheezing, rales, rhonchi. No use of accessory muscles of respiration.  CARDIOVASCULAR: S1, S2 RRR. No murmurs, rubs, gallops, clicks.  ABDOMEN: Soft, nontender, nondistended. Bowel sounds present. No organomegaly or mass.  EXTREMITIES: No pedal edema, cyanosis, or clubbing. + 2 pedal & radial pulses b/l.   NEUROLOGIC: Cranial nerves II through XII are intact. No focal Motor or sensory deficits appreciated b/l.  PSYCHIATRIC: The patient is alert and oriented x 3.  SKIN: No obvious rash, lesion, or ulcer.   LABORATORY PANEL:   CBC Recent Labs  Lab 11/11/17 1804  WBC 14.5*  HGB 14.8  HCT 44.9  PLT 223    ------------------------------------------------------------------------------------------------------------------  Chemistries  Recent Labs  Lab 11/11/17 1804  NA 139  K 4.4  CL 101  CO2 28  GLUCOSE 123*  BUN 21*  CREATININE 1.42*  CALCIUM 9.5  AST 27  ALT 19  ALKPHOS 69  BILITOT 0.5   ------------------------------------------------------------------------------------------------------------------  Cardiac Enzymes No results for input(s): TROPONINI in the last 168 hours. ------------------------------------------------------------------------------------------------------------------  RADIOLOGY:  Dg Ribs Unilateral W/chest Left  Result Date: 11/11/2017 CLINICAL DATA:  Acute left rib pain without known injury. EXAM: LEFT RIBS AND CHEST - 3+ VIEW COMPARISON:  Radiographs of Nov 07, 2015. FINDINGS: No fracture or other bone lesions are seen  involving the ribs. There is no evidence of pneumothorax or pleural effusion. Both lungs are clear. Heart size and mediastinal contours are within normal limits. IMPRESSION: Normal left ribs.  No acute cardiopulmonary abnormality seen. Electronically Signed   By: Marijo Conception, M.D.   On: 11/11/2017 15:57   Ct Angio Chest Pe W And/or Wo Contrast  Result Date: 11/11/2017 CLINICAL DATA:  77 year old male presents with left upper quadrant pain starting yesterday now severe and intermittent. Dyspnea also noted. EXAM: CT ANGIOGRAPHY CHEST CT ABDOMEN AND PELVIS WITH CONTRAST TECHNIQUE: Multidetector CT imaging of the chest was performed using the standard protocol during bolus administration of intravenous contrast. Multiplanar CT image reconstructions and MIPs were obtained to evaluate the vascular anatomy. Multidetector CT imaging of the abdomen and pelvis was performed using the standard protocol during bolus administration of intravenous contrast. CONTRAST:  127mL ISOVUE-370 IOPAMIDOL (ISOVUE-370) INJECTION 76% COMPARISON:  CT AP 05/20/2017  FINDINGS: CTA CHEST FINDINGS Cardiovascular: Conventional branch pattern of the great vessels without stenosis. Nonaneurysmal atherosclerotic aorta. Satisfactory opacification of the pulmonary arteries. Pulmonary embolus within a lingular branch of the pulmonary arterial system. RV/LV ratio equals 1. Heart is mildly enlarged without pericardial effusion. There is left main with coronary arteriosclerosis along the LAD and left circumflex. Mediastinum/Nodes: Left lower paratracheal adenopathy likely reactive measuring 13 mm short axis. Smaller AP window and right paratracheal lymph nodes are present in addition small bilateral hilar lymph nodes. The trachea is patent. The mainstem bronchi are unremarkable and patent. The esophagus is nonacute. Lungs/Pleura: Centrilobular emphysema, upper lobe predominant. Patchy airspace consolidations in the lingula and left lower lobe with trace pleural effusions. Musculoskeletal: No chest wall abnormality. No acute or significant osseous findings. Review of the MIP images confirms the above findings. CT ABDOMEN and PELVIS FINDINGS Hepatobiliary: No focal liver abnormality is seen. Status post cholecystectomy. No biliary dilatation. Pancreas: Unremarkable. No pancreatic ductal dilatation or surrounding inflammatory changes. Spleen: Normal in size without focal abnormality. Adrenals/Urinary Tract: Subcentimeter renal cysts too small to further characterize. No enhancing mass lesions of either kidney. Normal adrenal glands bilaterally. No nephrolithiasis nor obstructive uropathy. Decompressed urinary bladder, unremarkable for degree of distention. Stomach/Bowel: Moderate fecal retention within the colon without bowel obstruction or inflammation. The stomach is decompressed. There is a small hiatal hernia. Normal small bowel rotation without bowel obstruction or inflammation. Normal appendix. Vascular/Lymphatic: Moderate aortoiliac atherosclerosis. No abdominopelvic adenopathy.  Reproductive: Peripheral zone calcifications noted of the normal size prostate. Seminal vesicles are unremarkable. Other: No free air nor free fluid. Fat containing bilateral inguinal hernias. Musculoskeletal: Mild physiologic anterior wedging of T11 with associated degenerative disc disease at T9-10, T10-11 and T11-12. Degenerative disc disease also noted at L5-S1. Review of the MIP images confirms the above findings. IMPRESSION: Chest CT: 1. Positive for acute PE within the lingula with CT evidence of right heart strain (RV/LV Ratio = 1) consistent with at least submassive (intermediate risk) PE. These results were called by telephone at the time of interpretation on 11/11/2017 at 7:00 pm to Dr. Carrie Mew , who verbally acknowledged these results. 2. Lingular and left lower lobe airspace disease consistent with pneumonia with reactive left lower paratracheal adenopathy. Differential considerations may include a pulmonary infarct but suspect pneumonia given what appears to be the reactive adenopathy. 3. Coronary arteriosclerosis and aortic atherosclerosis. 4. Mild centrilobular emphysema bilaterally. CT AP: 1. Bilateral small subcentimeter hypodensities within both kidneys statistically consistent with cysts but are too small to further characterize. 2. No acute bowel obstruction or  inflammation. 3. Thoracolumbar degenerative disc disease without acute osseous abnormality. Electronically Signed   By: Ashley Royalty M.D.   On: 11/11/2017 19:01   Ct Abdomen Pelvis W Contrast  Result Date: 11/11/2017 CLINICAL DATA:  77 year old male presents with left upper quadrant pain starting yesterday now severe and intermittent. Dyspnea also noted. EXAM: CT ANGIOGRAPHY CHEST CT ABDOMEN AND PELVIS WITH CONTRAST TECHNIQUE: Multidetector CT imaging of the chest was performed using the standard protocol during bolus administration of intravenous contrast. Multiplanar CT image reconstructions and MIPs were obtained to evaluate  the vascular anatomy. Multidetector CT imaging of the abdomen and pelvis was performed using the standard protocol during bolus administration of intravenous contrast. CONTRAST:  174mL ISOVUE-370 IOPAMIDOL (ISOVUE-370) INJECTION 76% COMPARISON:  CT AP 05/20/2017 FINDINGS: CTA CHEST FINDINGS Cardiovascular: Conventional branch pattern of the great vessels without stenosis. Nonaneurysmal atherosclerotic aorta. Satisfactory opacification of the pulmonary arteries. Pulmonary embolus within a lingular branch of the pulmonary arterial system. RV/LV ratio equals 1. Heart is mildly enlarged without pericardial effusion. There is left main with coronary arteriosclerosis along the LAD and left circumflex. Mediastinum/Nodes: Left lower paratracheal adenopathy likely reactive measuring 13 mm short axis. Smaller AP window and right paratracheal lymph nodes are present in addition small bilateral hilar lymph nodes. The trachea is patent. The mainstem bronchi are unremarkable and patent. The esophagus is nonacute. Lungs/Pleura: Centrilobular emphysema, upper lobe predominant. Patchy airspace consolidations in the lingula and left lower lobe with trace pleural effusions. Musculoskeletal: No chest wall abnormality. No acute or significant osseous findings. Review of the MIP images confirms the above findings. CT ABDOMEN and PELVIS FINDINGS Hepatobiliary: No focal liver abnormality is seen. Status post cholecystectomy. No biliary dilatation. Pancreas: Unremarkable. No pancreatic ductal dilatation or surrounding inflammatory changes. Spleen: Normal in size without focal abnormality. Adrenals/Urinary Tract: Subcentimeter renal cysts too small to further characterize. No enhancing mass lesions of either kidney. Normal adrenal glands bilaterally. No nephrolithiasis nor obstructive uropathy. Decompressed urinary bladder, unremarkable for degree of distention. Stomach/Bowel: Moderate fecal retention within the colon without bowel  obstruction or inflammation. The stomach is decompressed. There is a small hiatal hernia. Normal small bowel rotation without bowel obstruction or inflammation. Normal appendix. Vascular/Lymphatic: Moderate aortoiliac atherosclerosis. No abdominopelvic adenopathy. Reproductive: Peripheral zone calcifications noted of the normal size prostate. Seminal vesicles are unremarkable. Other: No free air nor free fluid. Fat containing bilateral inguinal hernias. Musculoskeletal: Mild physiologic anterior wedging of T11 with associated degenerative disc disease at T9-10, T10-11 and T11-12. Degenerative disc disease also noted at L5-S1. Review of the MIP images confirms the above findings. IMPRESSION: Chest CT: 1. Positive for acute PE within the lingula with CT evidence of right heart strain (RV/LV Ratio = 1) consistent with at least submassive (intermediate risk) PE. These results were called by telephone at the time of interpretation on 11/11/2017 at 7:00 pm to Dr. Carrie Mew , who verbally acknowledged these results. 2. Lingular and left lower lobe airspace disease consistent with pneumonia with reactive left lower paratracheal adenopathy. Differential considerations may include a pulmonary infarct but suspect pneumonia given what appears to be the reactive adenopathy. 3. Coronary arteriosclerosis and aortic atherosclerosis. 4. Mild centrilobular emphysema bilaterally. CT AP: 1. Bilateral small subcentimeter hypodensities within both kidneys statistically consistent with cysts but are too small to further characterize. 2. No acute bowel obstruction or inflammation. 3. Thoracolumbar degenerative disc disease without acute osseous abnormality. Electronically Signed   By: Ashley Royalty M.D.   On: 11/11/2017 19:01   Dg Chest [facility]  1 View  Result Date: 11/11/2017 CLINICAL DATA:  Abdominal pain. EXAM: PORTABLE CHEST 1 VIEW COMPARISON:  11/11/2017 and prior radiographs. FINDINGS: This is a low volume film with LEFT basilar  atelectasis. There may be a trace LEFT pleural effusion present. No pneumothorax or pulmonary mass identified. No acute bony abnormalities are present. IMPRESSION: Low volume film with bibasilar atelectasis and LEFT basilar atelectasis and possible trace LEFT pleural effusion. Electronically Signed   By: Margarette Canada M.D.   On: 11/11/2017 18:10     IMPRESSION AND PLAN:   77 year old male with past medical history of hypertension, hypothyroidism, GERD, peripheral vascular disease, who presents to the hospital due to left-sided pleuritic chest pain noted to have a pulmonary embolism.  1.  Acute pulmonary embolism with infarct-this is a cause of patient's chest pain and shortness of breath. -This is a provoked thromboembolism given the fact the patient was not so active over the past 6 weeks due to his left knee injury. - Patient has been started on heparin drip, and he can be switched over to oral anticoagulant tomorrow.   -We will check Dopplers of his lower extremities, get an echocardiogram.  2.  Pneumonia- patient had a fever of 102 at home and has a mild leukocytosis. -Patient has been initiated on IV antibiotics with ceftriaxone, Zithromax.  I will continue that for now.  Patient's fever, chest pain and leukocytosis could also be related to underlying pulmonary infarct.  3.  Hypothyroidism-continue Synthroid.  Next  4.  Anxiety-continue Klonopin.  Next  5.  BPH-continue finasteride.  6.  Hyperlipidemia-continue Zetia.    All the records are reviewed and case discussed with ED provider. Management plans discussed with the patient, family and they are in agreement.  CODE STATUS: Full code  TOTAL TIME TAKING CARE OF THIS PATIENT: 40 minutes.    Henreitta Leber M.D on 11/11/2017 at 8:00 PM  Between 7am to 6pm - Pager - [phone]  After 6pm go to [email or web address] - password [email or web address] [facility]  [facility]  Office  [phone]  CC: Primary care physician; Leone Haven, MD

## 2017-11-11 NOTE — Telephone Encounter (Signed)
Copied from [place] [phone]. Topic: Appointment Scheduling - Scheduling Inquiry for Clinic >> Nov 11, 2017  9:41 AM Marja Kays F wrote: Pt is having severe rib pain on left lower side .  It is sore to touch and when he lays on that side pt states that it does take his breath away some with the pain  He has an appt with [facility] on 11/29/17 but does not want to wait that long to be seen and nothing is available any sooner   Best number [phone]

## 2017-11-11 NOTE — Progress Notes (Signed)
[place] for heparin Indication: pulmonary embolus  Allergies  Allergen Reactions  . Duloxetine     Unable to urinate  . Nsaids Other (See Comments)    CANNOT TAKE NSAIDS BECAUSE OF ESOPHAGUS ISSUES CANNOT TAKE NSAIDS BECAUSE OF ESOPHAGUS ISSUES  . Rosuvastatin Other (See Comments)    SEVERE MUSCLE ACHES  . Statins Other (See Comments)    SEVERE MUSCLE ACHES  . Tolmetin Other (See Comments)    CANNOT TAKE NSAIDS BECAUSE OF ESOPHAGUS ISSUES    Patient Measurements: Height: 6' (182.9 cm) Weight: 178 lb (80.7 kg) IBW/kg (Calculated) : 77.6  Vital Signs: Temp: 100.5 F (38.1 C) (05/23 1616) Temp Source: Oral (05/23 1616) BP: 148/74 (05/23 1800) Pulse Rate: 105 (05/23 1800)  Labs: Recent Labs    11/11/17 1120 11/11/17 1804  HGB 14.9 14.8  HCT 44.8 44.9  PLT 239.0 223  CREATININE 1.54* 1.42*  CKTOTAL 96  --     Estimated Creatinine Clearance: 50.1 mL/min (A) (by C-G formula based on SCr of 1.42 mg/dL (H)).   Medical History: Past Medical History:  Diagnosis Date  . Aspiration precautions    uses thicken in liquids.  Eats pureed food.  . Barrett esophagus   . Depression   . Gallstones   . GERD (gastroesophageal reflux disease)   . Heart murmur    History of  . Hemorrhoids   . Hypertension   . Hypothyroidism   . Iron deficiency anemia 02/10/2012  . Mitral valve disease   . Myalgia    Multiple  . Neuromuscular disorder ([place])   . PAD (peripheral artery disease) ([facility])    lower legs  . Pain syndrome, chronic    Severe  . Pneumonia    three times, last time >10 years ago  . Psoriasis   . Rheumatic heart valve incompetence   . Shortness of breath dyspnea   . Thyroiditis     Medications:   On no anticoagulants PTA  Assessment: 84 YOM with sepsis and PE Goal of Therapy:  Heparin level 0.3-0.7 units/ml  Monitor platelets by anticoagulation protocol: Yes   Plan:  Give 5000 units bolus x 1 Start heparin  infusion at 1400 units/hr  Will order anti-Xa level 8 hours after initiation of therapy at 0400 5/24  Vallery Sa, PhamD 11/11/2017,7:21 PM

## 2017-11-11 NOTE — Progress Notes (Signed)
PHARMACIST - PHYSICIAN ORDER COMMUNICATION  CONCERNING: P&T Medication Policy on Herbal Medications  DESCRIPTION:  This patient's order for:  Garlic Tablets has been noted.  This product(s) is classified as an "herbal" or natural product. Due to a lack of definitive safety studies or FDA approval, nonstandard manufacturing practices, plus the potential risk of unknown drug-drug interactions while on inpatient medications, the Pharmacy and Therapeutics Committee does not permit the use of "herbal" or natural products of this type within [facility].   ACTION TAKEN: The pharmacy department is unable to verify this order at this time and your patient has been informed of this safety policy. Please reevaluate patient's clinical condition at discharge and address if the herbal or natural product(s) should be resumed at that time.

## 2017-11-11 NOTE — Telephone Encounter (Signed)
Pt called again requesting results of xrays. I advised pt that radiology has not sent report to be reviewed by the provider. Pt wanting office to call radiology. He said that his fever is 102 now and that it has to be more than broken ribs. Pt states that he is also in pain. I talked with Patina and same information was discussed. I informed pt again and he said that we need to call radiologist or give him the # and he will call. I advised that I do not have a ph# for radiologist to give him. Pt states that he can't get anything done here and will go to the ER.

## 2017-11-11 NOTE — Progress Notes (Signed)
CODE SEPSIS - PHARMACY COMMUNICATION  **Broad Spectrum Antibiotics should be administered within 1 hour of Sepsis diagnosis**  Time Code Sepsis Called/Page Received: 1907  Antibiotics Ordered: Rocephin 2 grams  Time of 1st antibiotic administration: 1927  Additional action taken by pharmacy: none needed  If necessary, Name of Provider/Nurse Contacted: N/A    Dallie Piles ,PharmD Clinical Pharmacist  11/11/2017  7:37 PM

## 2017-11-11 NOTE — Telephone Encounter (Signed)
Patient had a fall a couple about 2 weeks ago falling on left side.

## 2017-11-11 NOTE — Telephone Encounter (Signed)
I contacted radiology and requested read.  X-ray is negative for Pneumonia and negative for fracture. His blood work does show that his white blood cell count is mildly elevated which suggests infection. Since he is now having fever I suggest evaluation in the ER. He may need a CT of his abdomen to further evaluate.

## 2017-11-11 NOTE — ED Provider Notes (Signed)
[facility] Emergency Department Provider Note  ____________________________________________  Time seen: Approximately 7:12 PM  I have reviewed the triage vital signs and the nursing notes.   HISTORY  Chief Complaint Abdominal Pain    HPI RASHI GIULIANI is a 77 y.o. male with a history of GERD, Barrett esophagus, hypertension, recurrent pneumonia and aspiration issues who complains of left upper quadrant abdominal pain that started last night. Intermittent, lasts a few seconds at a time but occurring very frequently, at least every minute. Hurts to breathe. Feels short of breath. No cough. Denies fever or chills at home. pain is nonradiating, moderate intensity, sharp. Better being upright or walking. Worse lying flat and eating.  patient has had poor appetite and not had anything to eat in the last 2 days except for some ice cream.  Electronic medical records reviewed, outpatient x-ray series of the ribs unremarkable.     Past Medical History:  Diagnosis Date  . Aspiration precautions    uses thicken in liquids.  Eats pureed food.  . Barrett esophagus   . Depression   . Gallstones   . GERD (gastroesophageal reflux disease)   . Heart murmur    History of  . Hemorrhoids   . Hypertension   . Hypothyroidism   . Iron deficiency anemia 02/10/2012  . Mitral valve disease   . Myalgia    Multiple  . Neuromuscular disorder ([place])   . PAD (peripheral artery disease) (HCC)    lower legs  . Pain syndrome, chronic    Severe  . Pneumonia    three times, last time >10 years ago  . Psoriasis   . Rheumatic heart valve incompetence   . Shortness of breath dyspnea   . Thyroiditis      Patient Active Problem List   Diagnosis Date Noted  . Left knee pain 10/29/2017  . Rhinitis 08/26/2017  . Cheilitis 06/02/2017  . Clay-colored stools 05/13/2017  . Dyspnea on exertion 02/24/2017  . Solitary pulmonary nodule on lung CT 02/24/2017  . Gastroesophageal  reflux disease 02/11/2016  . Aspiration into airway 01/14/2016  . Incomplete emptying of bladder 07/31/2015  . Angina pectoris ([place]) 02/22/2015  . COPD (chronic obstructive pulmonary disease) ([place]) 02/06/2015  . Dysphagia, oropharyngeal phase 01/22/2015  . Muscle pain 12/17/2014  . Adjustment disorder with depressed mood 11/12/2014  . Barrett esophagus 11/12/2014  . Mitral valve prolapse 09/12/2014  . Renal insufficiency 01/22/2014  . Disorder of kidney and ureter 01/22/2014  . Muscle cramps 12/19/2013  . Mitral valve regurgitation 09/14/2013  . 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 Elevated prostate specific antigen (PSA) 03/01/2012  . Nodular prostate with urinary obstruction 03/01/2012  . 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 Urinary hesitancy 03/01/2012  . Medicare annual wellness visit, subsequent 02/10/2012  . Smoking history 02/01/2012  . Chronic pain syndrome 08/12/2011  . 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 Hypothyroidism 03/12/2011  . PVD (peripheral vascular disease) ([place]) 01/27/2011  . MURMUR 07/24/2009     Past Surgical History:  Procedure Laterality Date  . BACK SURGERY    . CARDIAC CATHETERIZATION N/A 02/22/2015   Procedure: Left Heart Cath and Coronary Angiography;  Surgeon: Minna Merritts, MD;  Location: [place] [facility];  Service: Cardiovascular;  Laterality: N/A;  . CATARACT EXTRACTION W/PHACO Left 07/29/2016   Procedure: CATARACT EXTRACTION PHACO AND INTRAOCULAR LENS PLACEMENT (IOC)  Left eye IVA Topical;  Surgeon: Leandrew Koyanagi, MD;  Location: [place];  Service: Ophthalmology;  Laterality: Left;  . CATARACT EXTRACTION W/PHACO Right 08/19/2016   Procedure: CATARACT EXTRACTION PHACO AND INTRAOCULAR LENS PLACEMENT ([place]) right  TORIC;  Surgeon: Leandrew Koyanagi, MD;  Location: [place];  Service: Ophthalmology;  Laterality: Right;  prefers early  morning  . CHOLECYSTECTOMY  08/03/13  . COLONOSCOPY  03-08-12   Dr Donnella Sham  . COLONOSCOPY WITH PROPOFOL N/A 06/17/2017   Procedure: COLONOSCOPY WITH PROPOFOL;  Surgeon: [name], MD;  Location: [place] [facility];  Service: Gastroenterology;  Laterality: N/A;  . DIRECT LARYNGOSCOPY N/A 02/17/2016   Procedure: DIRECT LARYNGOSCOPY;  Surgeon: Jodi Marble, MD;  Location: [place];  Service: ENT;  Laterality: N/A;  . ESOPHAGEAL MANOMETRY N/A 02/27/2015   Procedure: ESOPHAGEAL MANOMETRY (EM);  Surgeon: Lollie Sails, MD;  Location: [facility] ENDOSCOPY;  Service: Endoscopy;  Laterality: N/A;  . ESOPHAGOGASTRODUODENOSCOPY (EGD) WITH PROPOFOL N/A 02/18/2015   Procedure: ESOPHAGOGASTRODUODENOSCOPY (EGD) WITH PROPOFOL;  Surgeon: Lollie Sails, MD;  Location: [facility] ENDOSCOPY;  Service: Endoscopy;  Laterality: N/A;  . ESOPHAGOGASTRODUODENOSCOPY (EGD) WITH PROPOFOL N/A 06/17/2017   Procedure: ESOPHAGOGASTRODUODENOSCOPY (EGD) WITH PROPOFOL;  Surgeon: [name], MD;  Location: [facility] ENDOSCOPY;  Service: Gastroenterology;  Laterality: N/A;  . ESOPHAGOSCOPY W/ BOTOX INJECTION N/A 02/17/2016   Procedure: ESOPHAGOSCOPY WITH BOTOX INJECTION  ;  Surgeon: Jodi Marble, MD;  Location: [place];  Service: ENT;  Laterality: N/A;  . HEMORROIDECTOMY    . UPPER GI ENDOSCOPY  03-06-2013   Dr Donnella Sham     Prior to Admission medications   Medication Sig Start Date End Date Taking? Authorizing Provider  Alirocumab (PRALUENT) 75 MG/ML SOPN Inject 75 mg into the skin every 14 (fourteen) days. 10/26/17  Yes Leone Haven, MD  azelastine (ASTELIN) 0.1 % nasal spray Place 2 sprays into both nostrils 2 (two) times daily. Use in each nostril as directed 08/26/17  Yes Leone Haven, MD  clobetasol (TEMOVATE) 0.05 % cream Apply topically as needed. Reported on 12/03/2015   Yes [provider]  clonazePAM (KLONOPIN) 2 MG tablet Take 1 tablet (2 mg total) by mouth 3 (three) times daily as  needed (severe muscle spasm). 09/07/17  Yes Leone Haven, MD  ezetimibe (ZETIA) 10 MG tablet Take 1 tablet (10 mg total) by mouth daily. 09/17/17  Yes Leone Haven, MD  finasteride (PROSCAR) 5 MG tablet Take 5 mg by mouth daily.     Yes [provider]  fluticasone (FLONASE) 50 MCG/ACT nasal spray Place into both nostrils daily as needed for allergies or rhinitis.   Yes [provider]  Garlic (GARLIQUE PO) Take by mouth daily.   Yes [provider]  levothyroxine (SYNTHROID, LEVOTHROID) 75 MCG tablet TAKE 1 TABLET DAILY 05/04/17  Yes Leone Haven, MD  Multiple Vitamins-Minerals (CENTRUM SILVER PO) Take 1 tablet by mouth every evening.    Yes [provider]  Testosterone (AXIRON) 30 MG/ACT SOLN Apply 1 pump under each armpit every night   Yes [provider]  cholestyramine (QUESTRAN) 4 g packet Take 1 packet (4 g total) by mouth 3 (three) times daily with meals. Patient not taking: Reported on 11/11/2017 06/10/17   [name], MD  colestipol (COLESTID) 1 G tablet Take 3 tablets (3 g total) by mouth 2 (two) times daily. Patient not taking: Reported on 11/11/2017 03/19/15   Minna Merritts, MD  escitalopram (LEXAPRO) 10 MG tablet Take 1 tablet (10 mg total) by mouth daily. Patient  not taking: Reported on 11/11/2017 11/10/16   Leone Haven, MD  esomeprazole (NEXIUM) 40 MG capsule Take 1 capsule (40 mg total) by mouth daily. Patient not taking: Reported on 11/11/2017 11/12/16   Leone Haven, MD  mupirocin ointment (BACTROBAN) 2 % Apply 1 application topically 2 (two) times daily. Patient not taking: Reported on 11/11/2017 06/02/17   Leone Haven, MD     Allergies Duloxetine; Nsaids; Rosuvastatin; Statins; and Tolmetin   Family History  Problem Relation Age of Onset  . Heart failure Mother   . Heart failure Father   . Emphysema Father        smoked  . Cancer Sister   . Aneurysm Sister        Brain    Social  History Social History   Tobacco Use  . Smoking status: Former Smoker    Packs/day: 1.00    Years: 30.00    Pack years: 30.00    Types: Cigarettes    Last attempt to quit: 06/22/2004    Years since quitting: 13.3  . Smokeless tobacco: Never Used  Substance Use Topics  . Alcohol use: No    Alcohol/week: 0.0 oz  . Drug use: No    Review of Systems  Constitutional:   No fever or chills.  ENT:   Positive sore throat. No rhinorrhea. Cardiovascular:  positive as above pleuritic chest pain without syncope. Respiratory:   positive shortness of breath without cough. Gastrointestinal:   positive as above for left upper quadrant abdominal pain without constipation or vomiting or diarrhea.  Musculoskeletal:   Negative for focal pain or swelling All other systems reviewed and are negative except as documented above in ROS and HPI.  ____________________________________________   PHYSICAL EXAM:  VITAL SIGNS: ED Triage Vitals  Enc Vitals Group     BP 11/11/17 1616 (!) 158/79     Pulse Rate 11/11/17 1616 99     Resp 11/11/17 1728 16     Temp 11/11/17 1616 (!) 100.5 F (38.1 C)     Temp Source 11/11/17 1616 Oral     SpO2 11/11/17 1616 95 %     Weight 11/11/17 1621 178 lb (80.7 kg)     Height 11/11/17 1621 6' (1.829 m)     Head Circumference --      Peak Flow --      Pain Score 11/11/17 1621 4     Pain Loc --      Pain Edu? --      Excl. in [place]? --     Vital signs reviewed, nursing assessments reviewed.   Constitutional:   Alert and oriented.not in distress Eyes:   Conjunctivae are normal. EOMI. PERRL. ENT      Head:   Normocephalic and atraumatic.      Nose:   No congestion/rhinnorhea.       Mouth/Throat:   dry mucous membranes, mild pharyngeal erythema. No peritonsillar mass.       Neck:   No meningismus. Full ROM. Hematological/Lymphatic/Immunilogical:   No cervical lymphadenopathy. Cardiovascular:   tachycardia heart rate 105. Symmetric bilateral radial and DP pulses.   No murmurs.  Respiratory:   normal work of breathing. Tachypnea. Clear symmetric breath sounds bilaterally, no expiratory wheezing. No crackles.. Gastrointestinal:   Soft with left upper quadrant tenderness. Non distended. There is no CVA tenderness.  No rebound, rigidity, or guarding.  Musculoskeletal:   Normal range of motion in all extremities. No joint effusions.  No lower extremity tenderness.  No edema. Neurologic:   Normal speech and language.  Motor grossly intact. No acute focal neurologic deficits are appreciated.  Skin:    Skin is warm, dry and intact. No rash noted.  No petechiae, purpura, or bullae.  ____________________________________________    LABS (pertinent positives/negatives) (all labs ordered are listed, but only abnormal results are displayed) Labs Reviewed  COMPREHENSIVE METABOLIC PANEL - Abnormal; Notable for the following components:      Result Value   Glucose, Bld 123 (*)    BUN 21 (*)    Creatinine, Ser 1.42 (*)    GFR calc non Af Amer 47 (*)    GFR calc Af Amer 55 (*)    All other components within normal limits  CBC WITH DIFFERENTIAL/PLATELET - Abnormal; Notable for the following components:   WBC 14.5 (*)    Neutro Abs 9.7 (*)    Monocytes Absolute 2.3 (*)    All other components within normal limits  URINALYSIS, COMPLETE (UACMP) WITH MICROSCOPIC - Abnormal; Notable for the following components:   Color, Urine YELLOW (*)    APPearance CLEAR (*)    All other components within normal limits  URINE CULTURE  CULTURE, BLOOD (SINGLE)  CULTURE, BLOOD (SINGLE)  LACTIC ACID, PLASMA  LIPASE, BLOOD  LACTIC ACID, PLASMA   ____________________________________________   EKG  Interpreted by me Sinus rhythm rate of 99, normal axis, first-degree AV block with PR interval 256 ms. Normal QRS ST segments and T waves.  ____________________________________________    RADIOLOGY  Dg Ribs Unilateral W/chest Left  Result Date: 11/11/2017 CLINICAL DATA:   Acute left rib pain without known injury. EXAM: LEFT RIBS AND CHEST - 3+ VIEW COMPARISON:  Radiographs of Nov 07, 2015. FINDINGS: No fracture or other bone lesions are seen involving the ribs. There is no evidence of pneumothorax or pleural effusion. Both lungs are clear. Heart size and mediastinal contours are within normal limits. IMPRESSION: Normal left ribs.  No acute cardiopulmonary abnormality seen. Electronically Signed   By: Marijo Conception, M.D.   On: 11/11/2017 15:57   Ct Angio Chest Pe W And/or Wo Contrast  Result Date: 11/11/2017 CLINICAL DATA:  77 year old male presents with left upper quadrant pain starting yesterday now severe and intermittent. Dyspnea also noted. EXAM: CT ANGIOGRAPHY CHEST CT ABDOMEN AND PELVIS WITH CONTRAST TECHNIQUE: Multidetector CT imaging of the chest was performed using the standard protocol during bolus administration of intravenous contrast. Multiplanar CT image reconstructions and MIPs were obtained to evaluate the vascular anatomy. Multidetector CT imaging of the abdomen and pelvis was performed using the standard protocol during bolus administration of intravenous contrast. CONTRAST:  170mL ISOVUE-370 IOPAMIDOL (ISOVUE-370) INJECTION 76% COMPARISON:  CT AP 05/20/2017 FINDINGS: CTA CHEST FINDINGS Cardiovascular: Conventional branch pattern of the great vessels without stenosis. Nonaneurysmal atherosclerotic aorta. Satisfactory opacification of the pulmonary arteries. Pulmonary embolus within a lingular branch of the pulmonary arterial system. RV/LV ratio equals 1. Heart is mildly enlarged without pericardial effusion. There is left main with coronary arteriosclerosis along the LAD and left circumflex. Mediastinum/Nodes: Left lower paratracheal adenopathy likely reactive measuring 13 mm short axis. Smaller AP window and right paratracheal lymph nodes are present in addition small bilateral hilar lymph nodes. The trachea is patent. The mainstem bronchi are unremarkable  and patent. The esophagus is nonacute. Lungs/Pleura: Centrilobular emphysema, upper lobe predominant. Patchy airspace consolidations in the lingula and left lower lobe with trace pleural effusions. Musculoskeletal: No chest wall abnormality. No acute or significant osseous findings. Review of the  MIP images confirms the above findings. CT ABDOMEN and PELVIS FINDINGS Hepatobiliary: No focal liver abnormality is seen. Status post cholecystectomy. No biliary dilatation. Pancreas: Unremarkable. No pancreatic ductal dilatation or surrounding inflammatory changes. Spleen: Normal in size without focal abnormality. Adrenals/Urinary Tract: Subcentimeter renal cysts too small to further characterize. No enhancing mass lesions of either kidney. Normal adrenal glands bilaterally. No nephrolithiasis nor obstructive uropathy. Decompressed urinary bladder, unremarkable for degree of distention. Stomach/Bowel: Moderate fecal retention within the colon without bowel obstruction or inflammation. The stomach is decompressed. There is a small hiatal hernia. Normal small bowel rotation without bowel obstruction or inflammation. Normal appendix. Vascular/Lymphatic: Moderate aortoiliac atherosclerosis. No abdominopelvic adenopathy. Reproductive: Peripheral zone calcifications noted of the normal size prostate. Seminal vesicles are unremarkable. Other: No free air nor free fluid. Fat containing bilateral inguinal hernias. Musculoskeletal: Mild physiologic anterior wedging of T11 with associated degenerative disc disease at T9-10, T10-11 and T11-12. Degenerative disc disease also noted at L5-S1. Review of the MIP images confirms the above findings. IMPRESSION: Chest CT: 1. Positive for acute PE within the lingula with CT evidence of right heart strain (RV/LV Ratio = 1) consistent with at least submassive (intermediate risk) PE. These results were called by telephone at the time of interpretation on 11/11/2017 at 7:00 pm to Dr. Carrie Mew , who verbally acknowledged these results. 2. Lingular and left lower lobe airspace disease consistent with pneumonia with reactive left lower paratracheal adenopathy. Differential considerations may include a pulmonary infarct but suspect pneumonia given what appears to be the reactive adenopathy. 3. Coronary arteriosclerosis and aortic atherosclerosis. 4. Mild centrilobular emphysema bilaterally. CT AP: 1. Bilateral small subcentimeter hypodensities within both kidneys statistically consistent with cysts but are too small to further characterize. 2. No acute bowel obstruction or inflammation. 3. Thoracolumbar degenerative disc disease without acute osseous abnormality. Electronically Signed   By: Ashley Royalty M.D.   On: 11/11/2017 19:01   Ct Abdomen Pelvis W Contrast  Result Date: 11/11/2017 CLINICAL DATA:  77 year old male presents with left upper quadrant pain starting yesterday now severe and intermittent. Dyspnea also noted. EXAM: CT ANGIOGRAPHY CHEST CT ABDOMEN AND PELVIS WITH CONTRAST TECHNIQUE: Multidetector CT imaging of the chest was performed using the standard protocol during bolus administration of intravenous contrast. Multiplanar CT image reconstructions and MIPs were obtained to evaluate the vascular anatomy. Multidetector CT imaging of the abdomen and pelvis was performed using the standard protocol during bolus administration of intravenous contrast. CONTRAST:  154mL ISOVUE-370 IOPAMIDOL (ISOVUE-370) INJECTION 76% COMPARISON:  CT AP 05/20/2017 FINDINGS: CTA CHEST FINDINGS Cardiovascular: Conventional branch pattern of the great vessels without stenosis. Nonaneurysmal atherosclerotic aorta. Satisfactory opacification of the pulmonary arteries. Pulmonary embolus within a lingular branch of the pulmonary arterial system. RV/LV ratio equals 1. Heart is mildly enlarged without pericardial effusion. There is left main with coronary arteriosclerosis along the LAD and left circumflex.  Mediastinum/Nodes: Left lower paratracheal adenopathy likely reactive measuring 13 mm short axis. Smaller AP window and right paratracheal lymph nodes are present in addition small bilateral hilar lymph nodes. The trachea is patent. The mainstem bronchi are unremarkable and patent. The esophagus is nonacute. Lungs/Pleura: Centrilobular emphysema, upper lobe predominant. Patchy airspace consolidations in the lingula and left lower lobe with trace pleural effusions. Musculoskeletal: No chest wall abnormality. No acute or significant osseous findings. Review of the MIP images confirms the above findings. CT ABDOMEN and PELVIS FINDINGS Hepatobiliary: No focal liver abnormality is seen. Status post cholecystectomy. No biliary dilatation. Pancreas: Unremarkable. No pancreatic ductal  dilatation or surrounding inflammatory changes. Spleen: Normal in size without focal abnormality. Adrenals/Urinary Tract: Subcentimeter renal cysts too small to further characterize. No enhancing mass lesions of either kidney. Normal adrenal glands bilaterally. No nephrolithiasis nor obstructive uropathy. Decompressed urinary bladder, unremarkable for degree of distention. Stomach/Bowel: Moderate fecal retention within the colon without bowel obstruction or inflammation. The stomach is decompressed. There is a small hiatal hernia. Normal small bowel rotation without bowel obstruction or inflammation. Normal appendix. Vascular/Lymphatic: Moderate aortoiliac atherosclerosis. No abdominopelvic adenopathy. Reproductive: Peripheral zone calcifications noted of the normal size prostate. Seminal vesicles are unremarkable. Other: No free air nor free fluid. Fat containing bilateral inguinal hernias. Musculoskeletal: Mild physiologic anterior wedging of T11 with associated degenerative disc disease at T9-10, T10-11 and T11-12. Degenerative disc disease also noted at L5-S1. Review of the MIP images confirms the above findings. IMPRESSION: Chest CT: 1.  Positive for acute PE within the lingula with CT evidence of right heart strain (RV/LV Ratio = 1) consistent with at least submassive (intermediate risk) PE. These results were called by telephone at the time of interpretation on 11/11/2017 at 7:00 pm to Dr. Carrie Mew , who verbally acknowledged these results. 2. Lingular and left lower lobe airspace disease consistent with pneumonia with reactive left lower paratracheal adenopathy. Differential considerations may include a pulmonary infarct but suspect pneumonia given what appears to be the reactive adenopathy. 3. Coronary arteriosclerosis and aortic atherosclerosis. 4. Mild centrilobular emphysema bilaterally. CT AP: 1. Bilateral small subcentimeter hypodensities within both kidneys statistically consistent with cysts but are too small to further characterize. 2. No acute bowel obstruction or inflammation. 3. Thoracolumbar degenerative disc disease without acute osseous abnormality. Electronically Signed   By: Ashley Royalty M.D.   On: 11/11/2017 19:01   Dg Chest Port 1 View  Result Date: 11/11/2017 CLINICAL DATA:  Abdominal pain. EXAM: PORTABLE CHEST 1 VIEW COMPARISON:  11/11/2017 and prior radiographs. FINDINGS: This is a low volume film with LEFT basilar atelectasis. There may be a trace LEFT pleural effusion present. No pneumothorax or pulmonary mass identified. No acute bony abnormalities are present. IMPRESSION: Low volume film with bibasilar atelectasis and LEFT basilar atelectasis and possible trace LEFT pleural effusion. Electronically Signed   By: Margarette Canada M.D.   On: 11/11/2017 18:10    ____________________________________________   PROCEDURES .Critical Care Performed by: Carrie Mew, MD Authorized by: Carrie Mew, MD   Critical care provider statement:    Critical care time (minutes):  30   Critical care time was exclusive of:  Separately billable procedures and treating other patients   Critical care was necessary to  treat or prevent imminent or life-threatening deterioration of the following conditions:  Circulatory failure, cardiac failure and sepsis   Critical care was time spent personally by me on the following activities:  Development of treatment plan with patient or surrogate, discussions with consultants, evaluation of patient's response to treatment, examination of patient, obtaining history from patient or surrogate, ordering and performing treatments and interventions, ordering and review of laboratory studies, ordering and review of radiographic studies, pulse oximetry, re-evaluation of patient's condition and review of old charts    ____________________________________________  DIFFERENTIAL DIAGNOSIS   GERD, gastritis, peptic ulcer disease, pulmonary embolism, pneumothorax  CLINICAL IMPRESSION / ASSESSMENT AND PLAN / ED COURSE  Pertinent labs & imaging results that were available during my care of the patient were reviewed by me and considered in my medical decision making (see chart for details).    patient presents with pleuritic chest  pain and shortness of breath. Clear lungs on exam. presentation is vague and differential is broad given his complaint of left upper quadrant abdominal pain with tenderness in that area, while also having pleuritic symptoms.  Has a low-grade fever, tachycardia, but on initial assessment does not appear to be septic, low suspicion of pneumonia relative to other differential items. We'll work up with CT injured her chest, CT abdomen pelvis. IV Pepcid for initial symptomatic treatment.  Clinical Course as of Nov 12 1943  Thu Nov 11, 2017  1901 D/w rads, who finds 1) lingular PE and 2) LLL consolidation c/w pneumonia on CT chest. CT a/p benign.   Diagnosis of pneumonia was not apparent on initial assessment, but evolving clinical picture with CT results establishes pneumonia and sepsis. Lactate normal. No evidence of shock. Will treat with abx, ceftriaxone and  azithromycin, plan to admit for further mgmt of symptomatic PE, CAP in elderly with early sepsis.    [PS]    Clinical Course User Index [PS] Carrie Mew, MD     ----------------------------------------- 7:43 PM on 11/11/2017 -----------------------------------------  Case discussed with hospitalist for further management. Patient and spouse updated on diagnoses and plan of care.  ____________________________________________   FINAL CLINICAL IMPRESSION(S) / ED DIAGNOSES    Final diagnoses:  Other acute pulmonary embolism without acute cor pulmonale (HCC)  Pneumonia of left lower lobe due to infectious organism ([place])  Sepsis, due to unspecified organism [facility]     ED Discharge Orders    None      Portions of this note were generated with dragon dictation software. Dictation errors may occur despite best attempts at proofreading.    Carrie Mew, MD 11/11/17 [phone]

## 2017-11-11 NOTE — Telephone Encounter (Signed)
Seen at 11:00pm today by Lenna Sciara , NP

## 2017-11-11 NOTE — Telephone Encounter (Signed)
Tried calling patient at home number , got patients answering machine . Patient is currently in ED.  Left message for patient to follow up with [place] after he gets home from ED.

## 2017-11-11 NOTE — Patient Instructions (Signed)
Please complete lab work and x ray prior to leaving. Call if symptoms worsen or if they do not improve in the next 1 week.  You may use tylenol as needed for pain.

## 2017-11-11 NOTE — ED Notes (Signed)
Pt had blood work and xray done at PCP this morning, no protocol orders needed at this time per Dr. Quentin Cornwall.  EKG was performed in triage.

## 2017-11-12 ENCOUNTER — Inpatient Hospital Stay (HOSPITAL_COMMUNITY)
Admit: 2017-11-12 | Discharge: 2017-11-12 | Disposition: A | Discharge status: Home / Self Care | Payer: Medicare Other | Attending: Specialist | Admitting: Specialist

## 2017-11-12 ENCOUNTER — Inpatient Hospital Stay: Payer: Medicare Other

## 2017-11-12 DIAGNOSIS — R109 Unspecified abdominal pain: Secondary | ICD-10-CM

## 2017-11-12 DIAGNOSIS — I82401 Acute embolism and thrombosis of unspecified deep veins of right lower extremity: Secondary | ICD-10-CM

## 2017-11-12 DIAGNOSIS — R079 Chest pain, unspecified: Secondary | ICD-10-CM

## 2017-11-12 DIAGNOSIS — I2699 Other pulmonary embolism without acute cor pulmonale: Secondary | ICD-10-CM

## 2017-11-12 LAB — BASIC METABOLIC PANEL
Anion gap: 9 (ref 5–15)
BUN: 19 mg/dL (ref 6–20)
CO2: 28 mmol/L (ref 22–32)
Calcium: 9 mg/dL (ref 8.9–10.3)
Chloride: 102 mmol/L (ref 101–111)
Creatinine, Ser: 1.54 mg/dL — ABNORMAL HIGH (ref 0.61–1.24)
GFR calc Af Amer: 50 mL/min — ABNORMAL LOW (ref >60)
GFR calc non Af Amer: 43 mL/min — ABNORMAL LOW (ref >60)
GLUCOSE: 105 mg/dL — AB (ref 65–99)
Potassium: 3.8 mmol/L (ref 3.5–5.1)
Sodium: 139 mmol/L (ref 135–145)

## 2017-11-12 LAB — CBC
HCT: 41.1 % (ref 40.0–52.0)
HEMOGLOBIN: 13.9 g/dL (ref 13.0–18.0)
MCH: 30 pg (ref 26.0–34.0)
MCHC: 33.9 g/dL (ref 32.0–36.0)
MCV: 88.5 fL (ref 80.0–100.0)
Platelets: 199 10*3/uL (ref 150–440)
RBC: 4.65 MIL/uL (ref 4.40–5.90)
RDW: 13.5 % (ref 11.5–14.5)
WBC: 12.9 10*3/uL — ABNORMAL HIGH (ref 3.8–10.6)

## 2017-11-12 LAB — ECHOCARDIOGRAM COMPLETE
HEIGHTINCHES: 72 in
WEIGHTICAEL: 2862.4 [oz_av]

## 2017-11-12 LAB — HEPARIN LEVEL (UNFRACTIONATED)
HEPARIN UNFRACTIONATED: 0.41 [IU]/mL (ref 0.30–0.70)
Heparin Unfractionated: 0.77 IU/mL — ABNORMAL HIGH (ref 0.30–0.70)
Heparin Unfractionated: 0.36 IU/mL (ref 0.30–0.70)

## 2017-11-12 MED ORDER — AZITHROMYCIN 250 MG PO TABS
250.0000 mg | ORAL_TABLET | Freq: Every day | ORAL | Status: DC
  Administered 2017-11-12 – 2017-11-13 (×2): 250 mg via ORAL
  Filled 2017-11-12 (×2): qty 1

## 2017-11-12 NOTE — Consult Note (Signed)
[facility] Consult Note  MRN : [id number]  Timothy Turner is a 77 y.o. (29-Oct-1940) male who presents with chief complaint of  Chief Complaint  Patient presents with  . Abdominal Pain   History of Present Illness:  The patient is a 77 year old male with a past medical history of peripheral vascular disease, hypothyroidism, chronic pain syndrome, hyperlipidemia, mitral valve regurgitation, renal insufficiency, Barrett's esophagus, COPD, anxiety, depression who presented to the [facility] ED with left-sided pleuritic chest pain which suddenly started yesterday.  The patient sought medical attention when the pain did not improve.  Patient was experiencing some exertional dyspnea however this has seemed to resolved.  Patient states only time he experiences any dyspnea is during intermittent " pain attacks".  Patient does note "injuring his knee about a month ago".  He was found to have pulmonary embolism on CT scan [place] pulmonary infarct.  Patient was also found to have an occlusive DVT within 1 of the left paired peroneal veins.  No evidence of DVT to the right lower extremity.   Vascular surgery was consulted by Dr. Leslye Peer for the recommendations.  Current Facility-Administered Medications  Medication Dose Route Frequency Provider Last Rate Last Dose  . acetaminophen (TYLENOL) tablet 650 mg  650 mg Oral Q6H PRN Henreitta Leber, MD       Or  . acetaminophen (TYLENOL) suppository 650 mg  650 mg Rectal Q6H PRN Henreitta Leber, MD      . azelastine (ASTELIN) 0.1 % nasal spray 2 spray  2 spray Each Nare BID Henreitta Leber, MD   2 spray at 11/12/17 0931  . azithromycin (ZITHROMAX) tablet 250 mg  250 mg Oral Daily Loletha Grayer, MD   250 mg at 11/12/17 1711  . cefTRIAXone (ROCEPHIN) 2 g in sodium chloride 0.9 % 100 mL IVPB  2 g Intravenous Q24H Carrie Mew, MD   Stopped at 11/11/17 1955  . clonazePAM (KLONOPIN) tablet 2 mg  2  mg Oral TID PRN Henreitta Leber, MD   2 mg at 11/11/17 2141  . ezetimibe (ZETIA) tablet 10 mg  10 mg Oral Daily Henreitta Leber, MD   10 mg at 11/12/17 0931  . finasteride (PROSCAR) tablet 5 mg  5 mg Oral Daily Henreitta Leber, MD   5 mg at 11/12/17 0930  . heparin ADULT infusion 100 units/mL (25000 units/269mL sodium chloride 0.45%)  1,300 Units/hr Intravenous Continuous Henreitta Leber, MD 13 mL/hr at 11/12/17 1129 1,300 Units/hr at 11/12/17 1129  . levothyroxine (SYNTHROID, LEVOTHROID) tablet 75 mcg  75 mcg Oral QAC breakfast Henreitta Leber, MD   75 mcg at 11/12/17 0933  . ondansetron (ZOFRAN) tablet 4 mg  4 mg Oral Q6H PRN Henreitta Leber, MD       Or  . ondansetron (ZOFRAN) injection 4 mg  4 mg Intravenous Q6H PRN Henreitta Leber, MD      . traMADol Veatrice Bourbon) tablet 50 mg  50 mg Oral Q6H PRN Henreitta Leber, MD   50 mg at 11/12/17 1711   Past Medical History:  Diagnosis Date  . Aspiration precautions    uses thicken in liquids.  Eats pureed food.  . Barrett esophagus   . Depression   . Gallstones   . GERD (gastroesophageal reflux disease)   . Heart murmur    History of  . Hemorrhoids   . Hypertension   . Hypothyroidism   . Iron deficiency anemia  02/10/2012  . Mitral valve disease   . Myalgia    Multiple  . Neuromuscular disorder ([place])   . PAD (peripheral artery disease) (HCC)    lower legs  . Pain syndrome, chronic    Severe  . Pneumonia    three times, last time >10 years ago  . Psoriasis   . Rheumatic heart valve incompetence   . Shortness of breath dyspnea   . Thyroiditis    Past Surgical History:  Procedure Laterality Date  . BACK SURGERY    . CARDIAC CATHETERIZATION N/A 02/22/2015   Procedure: Left Heart Cath and Coronary Angiography;  Surgeon: Minna Merritts, MD;  Location: [facility];  Service: Cardiovascular;  Laterality: N/A;  . CATARACT EXTRACTION W/PHACO Left 07/29/2016   Procedure: CATARACT EXTRACTION PHACO AND INTRAOCULAR LENS PLACEMENT  (IOC)  Left eye IVA Topical;  Surgeon: Leandrew Koyanagi, MD;  Location: [facility];  Service: Ophthalmology;  Laterality: Left;  . CATARACT EXTRACTION W/PHACO Right 08/19/2016   Procedure: CATARACT EXTRACTION PHACO AND INTRAOCULAR LENS PLACEMENT ([place]) right  TORIC;  Surgeon: Leandrew Koyanagi, MD;  Location: [place];  Service: Ophthalmology;  Laterality: Right;  prefers early morning  . CHOLECYSTECTOMY  08/03/13  . COLONOSCOPY  03-08-12   Dr Donnella Sham  . COLONOSCOPY WITH PROPOFOL N/A 06/17/2017   Procedure: COLONOSCOPY WITH PROPOFOL;  Surgeon: [name], MD;  Location: [facility] ENDOSCOPY;  Service: Gastroenterology;  Laterality: N/A;  . DIRECT LARYNGOSCOPY N/A 02/17/2016   Procedure: DIRECT LARYNGOSCOPY;  Surgeon: Jodi Marble, MD;  Location: [place];  Service: ENT;  Laterality: N/A;  . ESOPHAGEAL MANOMETRY N/A 02/27/2015   Procedure: ESOPHAGEAL MANOMETRY (EM);  Surgeon: Lollie Sails, MD;  Location: [facility] ENDOSCOPY;  Service: Endoscopy;  Laterality: N/A;  . ESOPHAGOGASTRODUODENOSCOPY (EGD) WITH PROPOFOL N/A 02/18/2015   Procedure: ESOPHAGOGASTRODUODENOSCOPY (EGD) WITH PROPOFOL;  Surgeon: Lollie Sails, MD;  Location: [facility] ENDOSCOPY;  Service: Endoscopy;  Laterality: N/A;  . ESOPHAGOGASTRODUODENOSCOPY (EGD) WITH PROPOFOL N/A 06/17/2017   Procedure: ESOPHAGOGASTRODUODENOSCOPY (EGD) WITH PROPOFOL;  Surgeon: [name], MD;  Location: [facility] ENDOSCOPY;  Service: Gastroenterology;  Laterality: N/A;  . ESOPHAGOSCOPY W/ BOTOX INJECTION N/A 02/17/2016   Procedure: ESOPHAGOSCOPY WITH BOTOX INJECTION  ;  Surgeon: Jodi Marble, MD;  Location: [place];  Service: ENT;  Laterality: N/A;  . HEMORROIDECTOMY    . UPPER GI ENDOSCOPY  03-06-2013   Dr Donnella Sham   Social History Social History   Tobacco Use  . Smoking status: Former Smoker    Packs/day: 1.00    Years: 30.00    Pack years: 30.00    Types: Cigarettes    Last attempt to quit: 06/22/2004     Years since quitting: 13.4  . Smokeless tobacco: Never Used  Substance Use Topics  . Alcohol use: No    Alcohol/week: 0.0 oz  . Drug use: No   Family History Family History  Problem Relation Age of Onset  . Heart failure Mother   . Heart failure Father   . Emphysema Father        smoked  . Cancer Sister   . Aneurysm Sister        Brain  Patient denies any family history of peripheral artery disease, renal disease or venous disease.  Allergies  Allergen Reactions  . Duloxetine     Unable to urinate  . Nsaids Other (See Comments)    CANNOT TAKE NSAIDS BECAUSE OF ESOPHAGUS ISSUES CANNOT TAKE NSAIDS BECAUSE OF ESOPHAGUS ISSUES  .  Rosuvastatin Other (See Comments)    SEVERE MUSCLE ACHES  . Statins Other (See Comments)    SEVERE MUSCLE ACHES  . Tolmetin Other (See Comments)    CANNOT TAKE NSAIDS BECAUSE OF ESOPHAGUS ISSUES   REVIEW OF SYSTEMS (Negative unless checked)  Constitutional: [] Weight loss  [] Fever  [] Chills Cardiac: [x] Chest pain   [x] Chest pressure   [] Palpitations   [] Shortness of breath when laying flat   [] Shortness of breath at rest   [x] Shortness of breath with exertion. Vascular:  [] Pain in legs with walking   [] Pain in legs at rest   [] Pain in legs when laying flat   [] Claudication   [] Pain in feet when walking  [] Pain in feet at rest  [] Pain in feet when laying flat   [] History of DVT   [] Phlebitis   [] Swelling in legs   [] Varicose veins   [] Non-healing ulcers Pulmonary:   [] Uses home oxygen   [] Productive cough   [] Hemoptysis   [] Wheeze  [x] COPD   [] Asthma Neurologic:  [] Dizziness  [] Blackouts   [] Seizures   [] History of stroke   [] History of TIA  [] Aphasia   [] Temporary blindness   [] Dysphagia   [] Weakness or numbness in arms   [] Weakness or numbness in legs Musculoskeletal:  [] Arthritis   [] Joint swelling   [] Joint pain   [] Low back pain Hematologic:  [] Easy bruising  [] Easy bleeding   [] Hypercoagulable state   [] Anemic  [] Hepatitis Gastrointestinal:   [] Blood in stool   [] Vomiting blood  [] Gastroesophageal reflux/heartburn   [] Difficulty swallowing. Genitourinary:  [] Chronic kidney disease   [] Difficult urination  [] Frequent urination  [] Burning with urination   [] Blood in urine Skin:  [] Rashes   [] Ulcers   [] Wounds Psychological:  [] History of anxiety   []  History of major depression.  Physical Examination  Vitals:   11/11/17 2053 11/12/17 0348 11/12/17 0839 11/12/17 1739  BP: (!) 143/83 (!) 144/77 (!) 141/72 (!) 164/81  Pulse: 97 73 72 84  Resp:    20  Temp: 99.3 F (37.4 C) 98 F (36.7 C) 97.6 F (36.4 C) 98.5 F (36.9 C)  TempSrc: Oral Oral Oral Oral  SpO2: 95% 94% 93% 92%  Weight: 178 lb 14.4 oz (81.1 kg)     Height: 6' (1.829 m)      Body mass index is 24.26 kg/m. Gen:  WD/WN, NAD Head: [place]/AT, No temporalis wasting. Prominent temp pulse not noted. Ear/Nose/Throat: Hearing grossly intact, nares w/o erythema or drainage, oropharynx w/o Erythema/Exudate Eyes: Sclera non-icteric, conjunctiva clear Neck: Trachea midline.  No JVD.  Pulmonary:  Good air movement, respirations not labored, equal bilaterally.  Cardiac: RRR, normal S1, S2. Vascular:  Vessel Right Left  Radial Palpable Palpable  Ulnar Palpable Palpable  Brachial Palpable Palpable  Carotid Palpable, without bruit Palpable, without bruit  Aorta Not palpable N/A  Femoral Palpable Palpable  Popliteal Palpable Palpable  PT Palpable Palpable  DP Palpable Palpable   Right Lower Extremity: Thigh soft, calf soft, no pain with dorsiflexion, no pain with palpation.  No acute vascular compromise to the extremity.  Left Lower Extremity: Thigh soft, calf soft, no pain with dorsiflexion, no pain with palpation.  No acute vascular compromise to the extremity.  Gastrointestinal: soft, non-tender/non-distended. No guarding/reflex.  Musculoskeletal: M/S 5/5 throughout.  Extremities without ischemic changes.  No deformity or atrophy. No edema. Neurologic: Sensation grossly  intact in extremities.  Symmetrical.  Speech is fluent. Motor exam as listed above. Psychiatric: Judgment intact, Mood & affect appropriate for pt's clinical situation.  Dermatologic: No rashes or ulcers noted.  No cellulitis or open wounds. Lymph : No Cervical, Axillary, or Inguinal lymphadenopathy.  CBC Lab Results  Component Value Date   WBC 12.9 (H) 11/12/2017   HGB 13.9 11/12/2017   HCT 41.1 11/12/2017   MCV 88.5 11/12/2017   PLT 199 11/12/2017   BMET    Component Value Date/Time   NA 139 11/12/2017 0358   K 3.8 11/12/2017 0358   CL 102 11/12/2017 0358   CO2 28 11/12/2017 0358   GLUCOSE 105 (H) 11/12/2017 0358   BUN 19 11/12/2017 0358   CREATININE 1.54 (H) 11/12/2017 0358   CALCIUM 9.0 11/12/2017 0358   GFRNONAA 43 (L) 11/12/2017 0358   GFRAA 50 (L) 11/12/2017 0358   Estimated Creatinine Clearance: 46.2 mL/min (A) (by C-G formula based on SCr of 1.54 mg/dL (H)).  COAG Lab Results  Component Value Date   INR 1.09 11/11/2017   INR 1.01 02/21/2015   Radiology Dg Ribs Unilateral W/chest Left  Result Date: 11/11/2017 CLINICAL DATA:  Acute left rib pain without known injury. EXAM: LEFT RIBS AND CHEST - 3+ VIEW COMPARISON:  Radiographs of Nov 07, 2015. FINDINGS: No fracture or other bone lesions are seen involving the ribs. There is no evidence of pneumothorax or pleural effusion. Both lungs are clear. Heart size and mediastinal contours are within normal limits. IMPRESSION: Normal left ribs.  No acute cardiopulmonary abnormality seen. Electronically Signed   By: Marijo Conception, M.D.   On: 11/11/2017 15:57   Dg Tibia/fibula Left  Result Date: 10/29/2017 CLINICAL DATA:  Injured leg 2 weeks ago, continued pain. EXAM: LEFT TIBIA AND FIBULA - 2 VIEW COMPARISON:  None. FINDINGS: There is no evidence of fracture or other focal bone lesions. Soft tissues are unremarkable. IMPRESSION: Negative. Electronically Signed   By: Staci Righter M.D.   On: 10/29/2017 16:56   Ct Angio Chest  Pe W And/or Wo Contrast  Result Date: 11/11/2017 CLINICAL DATA:  77 year old male presents with left upper quadrant pain starting yesterday now severe and intermittent. Dyspnea also noted. EXAM: CT ANGIOGRAPHY CHEST CT ABDOMEN AND PELVIS WITH CONTRAST TECHNIQUE: Multidetector CT imaging of the chest was performed using the standard protocol during bolus administration of intravenous contrast. Multiplanar CT image reconstructions and MIPs were obtained to evaluate the vascular anatomy. Multidetector CT imaging of the abdomen and pelvis was performed using the standard protocol during bolus administration of intravenous contrast. CONTRAST:  120mL ISOVUE-370 IOPAMIDOL (ISOVUE-370) INJECTION 76% COMPARISON:  CT AP 05/20/2017 FINDINGS: CTA CHEST FINDINGS Cardiovascular: Conventional branch pattern of the great vessels without stenosis. Nonaneurysmal atherosclerotic aorta. Satisfactory opacification of the pulmonary arteries. Pulmonary embolus within a lingular branch of the pulmonary arterial system. RV/LV ratio equals 1. Heart is mildly enlarged without pericardial effusion. There is left main with coronary arteriosclerosis along the LAD and left circumflex. Mediastinum/Nodes: Left lower paratracheal adenopathy likely reactive measuring 13 mm short axis. Smaller AP window and right paratracheal lymph nodes are present in addition small bilateral hilar lymph nodes. The trachea is patent. The mainstem bronchi are unremarkable and patent. The esophagus is nonacute. Lungs/Pleura: Centrilobular emphysema, upper lobe predominant. Patchy airspace consolidations in the lingula and left lower lobe with trace pleural effusions. Musculoskeletal: No chest wall abnormality. No acute or significant osseous findings. Review of the MIP images confirms the above findings. CT ABDOMEN and PELVIS FINDINGS Hepatobiliary: No focal liver abnormality is seen. Status post cholecystectomy. No biliary dilatation. Pancreas: Unremarkable. No  pancreatic ductal dilatation or surrounding  inflammatory changes. Spleen: Normal in size without focal abnormality. Adrenals/Urinary Tract: Subcentimeter renal cysts too small to further characterize. No enhancing mass lesions of either kidney. Normal adrenal glands bilaterally. No nephrolithiasis nor obstructive uropathy. Decompressed urinary bladder, unremarkable for degree of distention. Stomach/Bowel: Moderate fecal retention within the colon without bowel obstruction or inflammation. The stomach is decompressed. There is a small hiatal hernia. Normal small bowel rotation without bowel obstruction or inflammation. Normal appendix. Vascular/Lymphatic: Moderate aortoiliac atherosclerosis. No abdominopelvic adenopathy. Reproductive: Peripheral zone calcifications noted of the normal size prostate. Seminal vesicles are unremarkable. Other: No free air nor free fluid. Fat containing bilateral inguinal hernias. Musculoskeletal: Mild physiologic anterior wedging of T11 with associated degenerative disc disease at T9-10, T10-11 and T11-12. Degenerative disc disease also noted at L5-S1. Review of the MIP images confirms the above findings. IMPRESSION: Chest CT: 1. Positive for acute PE within the lingula with CT evidence of right heart strain (RV/LV Ratio = 1) consistent with at least submassive (intermediate risk) PE. These results were called by telephone at the time of interpretation on 11/11/2017 at 7:00 pm to Dr. Carrie Mew , who verbally acknowledged these results. 2. Lingular and left lower lobe airspace disease consistent with pneumonia with reactive left lower paratracheal adenopathy. Differential considerations may include a pulmonary infarct but suspect pneumonia given what appears to be the reactive adenopathy. 3. Coronary arteriosclerosis and aortic atherosclerosis. 4. Mild centrilobular emphysema bilaterally. CT AP: 1. Bilateral small subcentimeter hypodensities within both kidneys statistically  consistent with cysts but are too small to further characterize. 2. No acute bowel obstruction or inflammation. 3. Thoracolumbar degenerative disc disease without acute osseous abnormality. Electronically Signed   By: Ashley Royalty M.D.   On: 11/11/2017 19:01   Ct Abdomen Pelvis W Contrast  Result Date: 11/11/2017 CLINICAL DATA:  77 year old male presents with left upper quadrant pain starting yesterday now severe and intermittent. Dyspnea also noted. EXAM: CT ANGIOGRAPHY CHEST CT ABDOMEN AND PELVIS WITH CONTRAST TECHNIQUE: Multidetector CT imaging of the chest was performed using the standard protocol during bolus administration of intravenous contrast. Multiplanar CT image reconstructions and MIPs were obtained to evaluate the vascular anatomy. Multidetector CT imaging of the abdomen and pelvis was performed using the standard protocol during bolus administration of intravenous contrast. CONTRAST:  18mL ISOVUE-370 IOPAMIDOL (ISOVUE-370) INJECTION 76% COMPARISON:  CT AP 05/20/2017 FINDINGS: CTA CHEST FINDINGS Cardiovascular: Conventional branch pattern of the great vessels without stenosis. Nonaneurysmal atherosclerotic aorta. Satisfactory opacification of the pulmonary arteries. Pulmonary embolus within a lingular branch of the pulmonary arterial system. RV/LV ratio equals 1. Heart is mildly enlarged without pericardial effusion. There is left main with coronary arteriosclerosis along the LAD and left circumflex. Mediastinum/Nodes: Left lower paratracheal adenopathy likely reactive measuring 13 mm short axis. Smaller AP window and right paratracheal lymph nodes are present in addition small bilateral hilar lymph nodes. The trachea is patent. The mainstem bronchi are unremarkable and patent. The esophagus is nonacute. Lungs/Pleura: Centrilobular emphysema, upper lobe predominant. Patchy airspace consolidations in the lingula and left lower lobe with trace pleural effusions. Musculoskeletal: No chest wall  abnormality. No acute or significant osseous findings. Review of the MIP images confirms the above findings. CT ABDOMEN and PELVIS FINDINGS Hepatobiliary: No focal liver abnormality is seen. Status post cholecystectomy. No biliary dilatation. Pancreas: Unremarkable. No pancreatic ductal dilatation or surrounding inflammatory changes. Spleen: Normal in size without focal abnormality. Adrenals/Urinary Tract: Subcentimeter renal cysts too small to further characterize. No enhancing mass lesions of either kidney. Normal adrenal glands  bilaterally. No nephrolithiasis nor obstructive uropathy. Decompressed urinary bladder, unremarkable for degree of distention. Stomach/Bowel: Moderate fecal retention within the colon without bowel obstruction or inflammation. The stomach is decompressed. There is a small hiatal hernia. Normal small bowel rotation without bowel obstruction or inflammation. Normal appendix. Vascular/Lymphatic: Moderate aortoiliac atherosclerosis. No abdominopelvic adenopathy. Reproductive: Peripheral zone calcifications noted of the normal size prostate. Seminal vesicles are unremarkable. Other: No free air nor free fluid. Fat containing bilateral inguinal hernias. Musculoskeletal: Mild physiologic anterior wedging of T11 with associated degenerative disc disease at T9-10, T10-11 and T11-12. Degenerative disc disease also noted at L5-S1. Review of the MIP images confirms the above findings. IMPRESSION: Chest CT: 1. Positive for acute PE within the lingula with CT evidence of right heart strain (RV/LV Ratio = 1) consistent with at least submassive (intermediate risk) PE. These results were called by telephone at the time of interpretation on 11/11/2017 at 7:00 pm to Dr. Carrie Mew , who verbally acknowledged these results. 2. Lingular and left lower lobe airspace disease consistent with pneumonia with reactive left lower paratracheal adenopathy. Differential considerations may include a pulmonary  infarct but suspect pneumonia given what appears to be the reactive adenopathy. 3. Coronary arteriosclerosis and aortic atherosclerosis. 4. Mild centrilobular emphysema bilaterally. CT AP: 1. Bilateral small subcentimeter hypodensities within both kidneys statistically consistent with cysts but are too small to further characterize. 2. No acute bowel obstruction or inflammation. 3. Thoracolumbar degenerative disc disease without acute osseous abnormality. Electronically Signed   By: Ashley Royalty M.D.   On: 11/11/2017 19:01   US Venous Img Lower Bilateral  Result Date: 11/12/2017 CLINICAL DATA:  History of pulmonary embolism. Evaluate for DVT. History of fall 6 weeks ago. Former smoker. EXAM: BILATERAL LOWER EXTREMITY VENOUS DOPPLER ULTRASOUND TECHNIQUE: Gray-scale sonography with graded compression, as well as color Doppler and duplex ultrasound were performed to evaluate the lower extremity deep venous systems from the level of the common femoral vein and including the common femoral, femoral, profunda femoral, popliteal and calf veins including the posterior tibial, peroneal and gastrocnemius veins when visible. The superficial great saphenous vein was also interrogated. Spectral Doppler was utilized to evaluate flow at rest and with distal augmentation maneuvers in the common femoral, femoral and popliteal veins. COMPARISON:  Chest CT-11/11/2017 FINDINGS: RIGHT LOWER EXTREMITY Common Femoral Vein: No evidence of thrombus. Normal compressibility, respiratory phasicity and response to augmentation. Saphenofemoral Junction: No evidence of thrombus. Normal compressibility and flow on color Doppler imaging. Profunda Femoral Vein: No evidence of thrombus. Normal compressibility and flow on color Doppler imaging. Femoral Vein: No evidence of thrombus. Normal compressibility, respiratory phasicity and response to augmentation. Popliteal Vein: No evidence of thrombus. Normal compressibility, respiratory phasicity and  response to augmentation. Calf Veins: No evidence of thrombus. Normal compressibility and flow on color Doppler imaging. Superficial Great Saphenous Vein: No evidence of thrombus. Normal compressibility. Venous Reflux:  None. Other Findings:  None. LEFT LOWER EXTREMITY Common Femoral Vein: No evidence of thrombus. Normal compressibility, respiratory phasicity and response to augmentation. Saphenofemoral Junction: No evidence of thrombus. Normal compressibility and flow on color Doppler imaging. Profunda Femoral Vein: No evidence of thrombus. Normal compressibility and flow on color Doppler imaging. Femoral Vein: No evidence of thrombus. Normal compressibility, respiratory phasicity and response to augmentation. Popliteal Vein: No evidence of thrombus. Normal compressibility, respiratory phasicity and response to augmentation. Calf Veins: Age-indeterminate hypoechoic occlusive thrombus is noted within 1 of the paired left peroneal veins (images 65 and 66). The paired posterior tibial veins  appear patent. Superficial Great Saphenous Vein: No evidence of thrombus. Normal compressibility. Venous Reflux:  None. Other Findings:  None. IMPRESSION: 1. Examination is positive for age-indeterminate occlusive DVT within one of the paired left peroneal veins. There is no extension of this short-segment distal tibial DVT to the more proximal venous system of the left lower extremity. 2. No evidence of DVT within the right lower extremity. Electronically Signed   By: Sandi Mariscal M.D.   On: 11/12/2017 11:37   Dg Chest Port 1 View  Result Date: 11/11/2017 CLINICAL DATA:  Abdominal pain. EXAM: PORTABLE CHEST 1 VIEW COMPARISON:  11/11/2017 and prior radiographs. FINDINGS: This is a low volume film with LEFT basilar atelectasis. There may be a trace LEFT pleural effusion present. No pneumothorax or pulmonary mass identified. No acute bony abnormalities are present. IMPRESSION: Low volume film with bibasilar atelectasis and LEFT  basilar atelectasis and possible trace LEFT pleural effusion. Electronically Signed   By: Margarette Canada M.D.   On: 11/11/2017 18:10   Dg Knee Complete 4 Views Left  Result Date: 10/29/2017 CLINICAL DATA:  Injury 2 weeks ago.  Pain. EXAM: LEFT KNEE - COMPLETE 4+ VIEW COMPARISON:  None. FINDINGS: No evidence of fracture, dislocation, or joint effusion. No evidence of arthropathy or other focal bone abnormality. Soft tissues are unremarkable. IMPRESSION: Negative. Electronically Signed   By: Staci Righter M.D.   On: 10/29/2017 16:57   Dg Femur Min 2 Views Left  Result Date: 10/29/2017 CLINICAL DATA:  Injury 2 weeks ago.  Pain. EXAM: LEFT FEMUR 2 VIEWS COMPARISON:  None. FINDINGS: There is no evidence of fracture or other focal bone lesions. Soft tissues are unremarkable. IMPRESSION: Negative. Electronically Signed   By: Staci Righter M.D.   On: 10/29/2017 16:56   Assessment/Plan 77 year old male with multiple medical issues found to have pulmonary embolism, pulmonary infarct and lower extremity DVT - Stable 1. PE: At this time there is no indication for vascular intervention.  Patient is stable.  Thrombolysis will not improve the patient's pleuritic chest pain.  Recommend anticoagulation and pulmonary follow-up. 2. DVT: At this time there is no indication for vascular intervention.  The patient is essentially asymptomatic and his physical exam is unremarkable.  Recommend anticoagulation and will be happy to follow him in our office as an outpatient 3. PVD: Patient with palpable pedal pulses to the bilateral lower extremity.  Patient denies any claudication-like symptoms, rest pain or ulceration to the bilateral lower extremity.  At this time there is no indication for vascular intervention at this time.  Discussed with Dr. Delana Meyer.  Sela Hua, PA-C  11/12/2017 7:32 PM  This note was created with Dragon medical transcription system.  Any error is purely unintentional

## 2017-11-12 NOTE — Progress Notes (Signed)
[place] for heparin Indication: pulmonary embolus  Allergies  Allergen Reactions  . Duloxetine     Unable to urinate  . Nsaids Other (See Comments)    CANNOT TAKE NSAIDS BECAUSE OF ESOPHAGUS ISSUES CANNOT TAKE NSAIDS BECAUSE OF ESOPHAGUS ISSUES  . Rosuvastatin Other (See Comments)    SEVERE MUSCLE ACHES  . Statins Other (See Comments)    SEVERE MUSCLE ACHES  . Tolmetin Other (See Comments)    CANNOT TAKE NSAIDS BECAUSE OF ESOPHAGUS ISSUES    Patient Measurements: Height: 6' (182.9 cm) Weight: 178 lb 14.4 oz (81.1 kg) IBW/kg (Calculated) : 77.6  Vital Signs: Temp: 97.6 F (36.4 C) (05/24 0839) Temp Source: Oral (05/24 0839) BP: 141/72 (05/24 0839) Pulse Rate: 72 (05/24 0839)  Labs: Recent Labs    11/11/17 1120 11/11/17 1804 11/11/17 2116 11/12/17 0358 11/12/17 1408  HGB 14.9 14.8  --  13.9  --   HCT 44.8 44.9  --  41.1  --   PLT 239.0 223  --  199  --   LABPROT  --   --  14.0  --   --   INR  --   --  1.09  --   --   HEPARINUNFRC  --   --   --  0.77* 0.36  CREATININE 1.54* 1.42*  --  1.54*  --   CKTOTAL 96  --   --   --   --     Estimated Creatinine Clearance: 46.2 mL/min (A) (by C-G formula based on SCr of 1.54 mg/dL (H)).   Medical History: Past Medical History:  Diagnosis Date  . Aspiration precautions    uses thicken in liquids.  Eats pureed food.  . Barrett esophagus   . Depression   . Gallstones   . GERD (gastroesophageal reflux disease)   . Heart murmur    History of  . Hemorrhoids   . Hypertension   . Hypothyroidism   . Iron deficiency anemia 02/10/2012  . Mitral valve disease   . Myalgia    Multiple  . Neuromuscular disorder ([place])   . PAD (peripheral artery disease) ([facility])    lower legs  . Pain syndrome, chronic    Severe  . Pneumonia    three times, last time >10 years ago  . Psoriasis   . Rheumatic heart valve incompetence   . Shortness of breath dyspnea   . Thyroiditis     Medications:    On no anticoagulants PTA  Assessment: 76 YOM with sepsis and PE Goal of Therapy:  Heparin level 0.3-0.7 units/ml  Monitor platelets by anticoagulation protocol: Yes   Plan:  05/24 @ 0400 HL 0.77 supratherapeutic. Will decrease rate to 1300 units/hr and will recheck HL @ 1300.  05/24 @ 1408 HL 0.36. Level is therapeutic. Will continue current infusion rate of 1300 units/hr Recheck HL in 6 hours.   Pernell Dupre, PharmD, BCPS Clinical Pharmacist 11/12/2017 2:49 PM

## 2017-11-12 NOTE — Progress Notes (Signed)
Initial Nutrition Assessment  DOCUMENTATION CODES:   Not applicable  INTERVENTION:  When appropriate, advance diet to:  NDD2/Nectar Thick Liquids  In the comments section of the diet order it should say: Please provide Vanilla or Strawberry Milkshake made with whole milk with each meal.  NUTRITION DIAGNOSIS:   Swallowing difficulty related to dysphagia, chronic illness as evidenced by per patient/family report.  GOAL:   Patient will meet greater than or equal to 90% of their needs  MONITOR:   PO intake  REASON FOR ASSESSMENT:   Consult Assessment of nutrition requirement/status  ASSESSMENT:   Timothy Turner is a 76 yo male with PMH HTN, hypothyroidism, PVD, anxiety/depression, GERD, barrett esophagus, who presents with left sided pleuritic chest pain and dyspnea on exertion. Normally likes 1.5 miles a day, but has not been ambulating much since his knee injury 6 weeks ago. Ct scan was positive for pulmonary embolism with infarct. Also exhibits pneumonia. Had not eaten for 2 days PTA except for some ice cream.  RD consulted to see patient for swallowing difficulty and modified diet needs. Patient has an extensive history dating back to 2015 of barrett esophagus, esophageal dysmotility, swallowing difficulty, and aspiration. He was recently seen by speech as an outpatient at [facility] (10/26/2017), found to have mild-moderate oropharyngeal dysphagia and recommended NDD2 diet with "thin plus" to nectar thick liquids.  He reports blenderizing all of his food at home and taking sips of milk thickened with whey protein after each bite to help food go down. He also eats a lot of ice cream.  Patient lost weight to 160 pounds with initial onset of this in 2015, but has gained 13 pounds over the past 6 months.  Discussed with patient restarting him on NDD2 diet, and having milkshakes come with each tray. Also discussed with RN and Speech Pathologist.  Will monitor intake and adjust as  needed. Placed comment for milkshakes in healthtouch for the kitchen.  Labs reviewed Medications reviewed and include:  Heparin gtt  NUTRITION - FOCUSED PHYSICAL EXAM:    Most Recent Value  Orbital Region  Moderate depletion  Upper Arm Region  No depletion  Thoracic and Lumbar Region  No depletion  Buccal Region  Mild depletion  Temple Region  Moderate depletion  Clavicle Bone Region  Mild depletion  Clavicle and Acromion Bone Region  No depletion  Scapular Bone Region  Unable to assess  Dorsal Hand  Moderate depletion  Patellar Region  Mild depletion  Anterior Thigh Region  Mild depletion  Posterior Calf Region  No depletion       Diet Order:   Diet Order           Diet NPO time specified  Diet effective now          EDUCATION NEEDS:   Not appropriate for education at this time  Skin:  Skin Assessment: Reviewed RN Assessment  Last BM:  11/11/2017  Height:   Ht Readings from Last 1 Encounters:  11/11/17 6' (1.829 m)    Weight:   Wt Readings from Last 1 Encounters:  11/11/17 178 lb 14.4 oz (81.1 kg)    Ideal Body Weight:  80.9 kg  BMI:  Body mass index is 24.26 kg/m.  Estimated Nutritional Needs:   Kcal:  2022-2427 calories  Protein:  97-121 grams (1.2-1.5g/kg)  Fluid:  >2L  Timothy Turner. Timothy Khurana, MS, RD LDN Inpatient Clinical Dietitian Pager [phone]

## 2017-11-12 NOTE — Telephone Encounter (Signed)
Noted  

## 2017-11-12 NOTE — Progress Notes (Signed)
[place] for heparin Indication: pulmonary embolus  Allergies  Allergen Reactions  . Duloxetine     Unable to urinate  . Nsaids Other (See Comments)    CANNOT TAKE NSAIDS BECAUSE OF ESOPHAGUS ISSUES CANNOT TAKE NSAIDS BECAUSE OF ESOPHAGUS ISSUES  . Rosuvastatin Other (See Comments)    SEVERE MUSCLE ACHES  . Statins Other (See Comments)    SEVERE MUSCLE ACHES  . Tolmetin Other (See Comments)    CANNOT TAKE NSAIDS BECAUSE OF ESOPHAGUS ISSUES    Patient Measurements: Height: 6' (182.9 cm) Weight: 178 lb 14.4 oz (81.1 kg) IBW/kg (Calculated) : 77.6  Vital Signs: Temp: 98.5 F (36.9 C) (05/24 1739) Temp Source: Oral (05/24 1739) BP: 164/81 (05/24 1739) Pulse Rate: 84 (05/24 1739)  Labs: Recent Labs    11/11/17 1120 11/11/17 1804 11/11/17 2116 11/12/17 0358 11/12/17 1408 11/12/17 2021  HGB 14.9 14.8  --  13.9  --   --   HCT 44.8 44.9  --  41.1  --   --   PLT 239.0 223  --  199  --   --   LABPROT  --   --  14.0  --   --   --   INR  --   --  1.09  --   --   --   HEPARINUNFRC  --   --   --  0.77* 0.36 0.41  CREATININE 1.54* 1.42*  --  1.54*  --   --   CKTOTAL 96  --   --   --   --   --     Estimated Creatinine Clearance: 46.2 mL/min (A) (by C-G formula based on SCr of 1.54 mg/dL (H)).   Medical History: Past Medical History:  Diagnosis Date  . Aspiration precautions    uses thicken in liquids.  Eats pureed food.  . Barrett esophagus   . Depression   . Gallstones   . GERD (gastroesophageal reflux disease)   . Heart murmur    History of  . Hemorrhoids   . Hypertension   . Hypothyroidism   . Iron deficiency anemia 02/10/2012  . Mitral valve disease   . Myalgia    Multiple  . Neuromuscular disorder ([place])   . PAD (peripheral artery disease) ([facility])    lower legs  . Pain syndrome, chronic    Severe  . Pneumonia    three times, last time >10 years ago  . Psoriasis   . Rheumatic heart valve incompetence   . Shortness  of breath dyspnea   . Thyroiditis     Medications:   On no anticoagulants PTA  Assessment: 76 YOM with sepsis and PE Goal of Therapy:  Heparin level 0.3-0.7 units/ml  Monitor platelets by anticoagulation protocol: Yes   Plan:  05/24 @ 0400 HL 0.77 supratherapeutic. Will decrease rate to 1300 units/hr and will recheck HL @ 1300.  05/24 @ 1408 HL 0.36. Level is therapeutic. Will continue current infusion rate of 1300 units/hr Recheck HL in 6 hours.   05/24 @ 2021 HL 0.41.   Will continue pt on current rate and recheck HL on 5/25 with AM labs.   Orene Desanctis, PharmD, Clinical Pharmacist 11/12/2017 8:47 PM

## 2017-11-12 NOTE — Progress Notes (Signed)
*  PRELIMINARY RESULTS* Echocardiogram 2D Echocardiogram has been performed.  Timothy Turner 11/12/2017, 1:50 PM

## 2017-11-12 NOTE — Progress Notes (Signed)
Patient ID: Timothy Turner, male   DOB: [birth year], 77 y.o.   MRN: [id number]  [employer] PROGRESS NOTE  Timothy Turner QIH:[id number] DOB: Nov 20, 1940 DOA: 11/11/2017 PCP: Timothy Haven, MD  HPI/Subjective: Patient still having chest pain.  5 out of 10 in intensity.  He cannot take a deep breath.  Is been having left knee pain for about 6 weeks and unable to do his power walking like he normally does.  He has been followed over at [facility] for difficulty swallowing.  Objective: Vitals:   11/12/17 0348 11/12/17 0839  BP: (!) 144/77 (!) 141/72  Pulse: 73 72  Resp:    Temp: 98 F (36.7 C) 97.6 F (36.4 C)  SpO2: 94% 93%    Filed Weights   11/11/17 1621 11/11/17 2053  Weight: 80.7 kg (178 lb) 81.1 kg (178 lb 14.4 oz)    ROS: Review of Systems  Constitutional: Negative for chills and fever.  Eyes: Negative for blurred vision.  Respiratory: Positive for cough and shortness of breath.   Cardiovascular: Positive for chest pain.  Gastrointestinal: Negative for abdominal pain, constipation, diarrhea, nausea and vomiting.  Genitourinary: Negative for dysuria.  Musculoskeletal: Negative for joint pain.  Neurological: Negative for dizziness and headaches.   Exam: Physical Exam  Constitutional: He is oriented to person, place, and time.  HENT:  Nose: No mucosal edema.  Mouth/Throat: No oropharyngeal exudate or posterior oropharyngeal edema.  Eyes: Pupils are equal, round, and reactive to light. Conjunctivae, EOM and lids are normal.  Neck: No JVD present. Carotid bruit is not present. No edema present. No thyroid mass and no thyromegaly present.  Cardiovascular: S1 normal and S2 normal. Exam reveals no gallop.  No murmur heard. Pulses:      Dorsalis pedis pulses are 2+ on the right side, and 2+ on the left side.  Respiratory: No respiratory distress. He has decreased breath sounds in the right lower field and the left lower field. He has no wheezes. He has rhonchi  in the left lower field. He has no rales.  GI: Soft. Bowel sounds are normal. There is no tenderness.  Musculoskeletal:       Right ankle: He exhibits no swelling.       Left ankle: He exhibits no swelling.  Lymphadenopathy:    He has no cervical adenopathy.  Neurological: He is alert and oriented to person, place, and time. No cranial nerve deficit.  Skin: Skin is warm. No rash noted. Nails show no clubbing.  Psychiatric: He has a normal mood and affect.      Data Reviewed: Basic Metabolic Panel: Recent Labs  Lab 11/11/17 1120 11/11/17 1804 11/12/17 0358  NA 138 139 139  K 4.4 4.4 3.8  CL 102 101 102  CO2 30 28 28   GLUCOSE 155* 123* 105*  BUN 22 21* 19  CREATININE 1.54* 1.42* 1.54*  CALCIUM 9.9 9.5 9.0   Liver Function Tests: Recent Labs  Lab 11/11/17 1120 11/11/17 1804  AST 20 27  ALT 15 19  ALKPHOS 69 69  BILITOT 0.6 0.5  PROT 7.0 7.2  ALBUMIN 4.1 4.2   Recent Labs  Lab 11/11/17 1120 11/11/17 1804  LIPASE 16.0 23   CBC: Recent Labs  Lab 11/11/17 1120 11/11/17 1804 11/12/17 0358  WBC 13.3* 14.5* 12.9*  NEUTROABS 8.9* 9.7*  --   HGB 14.9 14.8 13.9  HCT 44.8 44.9 41.1  MCV 88.4 87.9 88.5  PLT 239.0 223 199   Cardiac Enzymes: Recent Labs  Lab 11/11/17 1120  CKTOTAL 96     Recent Results (from the past 240 hour(s))  Blood culture (routine single)     Status: None (Preliminary result)   Collection Time: 11/11/17  6:04 PM  Result Value Ref Range Status   Specimen Description BLOOD BLOOD RIGHT FOREARM  Final   Special Requests   Final    Blood Culture results may not be optimal due to an excessive volume of blood received in culture bottles   Culture   Final    NO GROWTH < 12 HOURS Performed at [facility], [address], [place], [place] [address]    Report Status PENDING  Incomplete  Blood culture (single)     Status: None (Preliminary result)   Collection Time: 11/11/17  7:22 PM  Result Value Ref Range Status   Specimen  Description BLOOD BLOOD LEFT FOREARM  Final   Special Requests   Final    BOTTLES DRAWN AEROBIC AND ANAEROBIC Blood Culture results may not be optimal due to an excessive volume of blood received in culture bottles   Culture   Final    NO GROWTH < 12 HOURS Performed at [facility], [address] [place], [place] [address]    Report Status PENDING  Incomplete     Studies: Dg Ribs Unilateral W/chest Left  Result Date: 11/11/2017 CLINICAL DATA:  Acute left rib pain without known injury. EXAM: LEFT RIBS AND CHEST - 3+ VIEW COMPARISON:  Radiographs of Nov 07, 2015. FINDINGS: No fracture or other bone lesions are seen involving the ribs. There is no evidence of pneumothorax or pleural effusion. Both lungs are clear. Heart size and mediastinal contours are within normal limits. IMPRESSION: Normal left ribs.  No acute cardiopulmonary abnormality seen. Electronically Signed   By: Marijo Conception, M.D.   On: 11/11/2017 15:57   Ct Angio Chest Pe W And/or Wo Contrast  Result Date: 11/11/2017 CLINICAL DATA:  77 year old male presents with left upper quadrant pain starting yesterday now severe and intermittent. Dyspnea also noted. EXAM: CT ANGIOGRAPHY CHEST CT ABDOMEN AND PELVIS WITH CONTRAST TECHNIQUE: Multidetector CT imaging of the chest was performed using the standard protocol during bolus administration of intravenous contrast. Multiplanar CT image reconstructions and MIPs were obtained to evaluate the vascular anatomy. Multidetector CT imaging of the abdomen and pelvis was performed using the standard protocol during bolus administration of intravenous contrast. CONTRAST:  122mL ISOVUE-370 IOPAMIDOL (ISOVUE-370) INJECTION 76% COMPARISON:  CT AP 05/20/2017 FINDINGS: CTA CHEST FINDINGS Cardiovascular: Conventional branch pattern of the great vessels without stenosis. Nonaneurysmal atherosclerotic aorta. Satisfactory opacification of the pulmonary arteries. Pulmonary embolus within a lingular  branch of the pulmonary arterial system. RV/LV ratio equals 1. Heart is mildly enlarged without pericardial effusion. There is left main with coronary arteriosclerosis along the LAD and left circumflex. Mediastinum/Nodes: Left lower paratracheal adenopathy likely reactive measuring 13 mm short axis. Smaller AP window and right paratracheal lymph nodes are present in addition small bilateral hilar lymph nodes. The trachea is patent. The mainstem bronchi are unremarkable and patent. The esophagus is nonacute. Lungs/Pleura: Centrilobular emphysema, upper lobe predominant. Patchy airspace consolidations in the lingula and left lower lobe with trace pleural effusions. Musculoskeletal: No chest wall abnormality. No acute or significant osseous findings. Review of the MIP images confirms the above findings. CT ABDOMEN and PELVIS FINDINGS Hepatobiliary: No focal liver abnormality is seen. Status post cholecystectomy. No biliary dilatation. Pancreas: Unremarkable. No pancreatic ductal dilatation or surrounding inflammatory changes. Spleen: Normal  in size without focal abnormality. Adrenals/Urinary Tract: Subcentimeter renal cysts too small to further characterize. No enhancing mass lesions of either kidney. Normal adrenal glands bilaterally. No nephrolithiasis nor obstructive uropathy. Decompressed urinary bladder, unremarkable for degree of distention. Stomach/Bowel: Moderate fecal retention within the colon without bowel obstruction or inflammation. The stomach is decompressed. There is a small hiatal hernia. Normal small bowel rotation without bowel obstruction or inflammation. Normal appendix. Vascular/Lymphatic: Moderate aortoiliac atherosclerosis. No abdominopelvic adenopathy. Reproductive: Peripheral zone calcifications noted of the normal size prostate. Seminal vesicles are unremarkable. Other: No free air nor free fluid. Fat containing bilateral inguinal hernias. Musculoskeletal: Mild physiologic anterior wedging of  T11 with associated degenerative disc disease at T9-10, T10-11 and T11-12. Degenerative disc disease also noted at L5-S1. Review of the MIP images confirms the above findings. IMPRESSION: Chest CT: 1. Positive for acute PE within the lingula with CT evidence of right heart strain (RV/LV Ratio = 1) consistent with at least submassive (intermediate risk) PE. These results were called by telephone at the time of interpretation on 11/11/2017 at 7:00 pm to Dr. Carrie Mew , who verbally acknowledged these results. 2. Lingular and left lower lobe airspace disease consistent with pneumonia with reactive left lower paratracheal adenopathy. Differential considerations may include a pulmonary infarct but suspect pneumonia given what appears to be the reactive adenopathy. 3. Coronary arteriosclerosis and aortic atherosclerosis. 4. Mild centrilobular emphysema bilaterally. CT AP: 1. Bilateral small subcentimeter hypodensities within both kidneys statistically consistent with cysts but are too small to further characterize. 2. No acute bowel obstruction or inflammation. 3. Thoracolumbar degenerative disc disease without acute osseous abnormality. Electronically Signed   By: Ashley Royalty M.D.   On: 11/11/2017 19:01   Ct Abdomen Pelvis W Contrast  Result Date: 11/11/2017 CLINICAL DATA:  78 year old male presents with left upper quadrant pain starting yesterday now severe and intermittent. Dyspnea also noted. EXAM: CT ANGIOGRAPHY CHEST CT ABDOMEN AND PELVIS WITH CONTRAST TECHNIQUE: Multidetector CT imaging of the chest was performed using the standard protocol during bolus administration of intravenous contrast. Multiplanar CT image reconstructions and MIPs were obtained to evaluate the vascular anatomy. Multidetector CT imaging of the abdomen and pelvis was performed using the standard protocol during bolus administration of intravenous contrast. CONTRAST:  152mL ISOVUE-370 IOPAMIDOL (ISOVUE-370) INJECTION 76% COMPARISON:   CT AP 05/20/2017 FINDINGS: CTA CHEST FINDINGS Cardiovascular: Conventional branch pattern of the great vessels without stenosis. Nonaneurysmal atherosclerotic aorta. Satisfactory opacification of the pulmonary arteries. Pulmonary embolus within a lingular branch of the pulmonary arterial system. RV/LV ratio equals 1. Heart is mildly enlarged without pericardial effusion. There is left main with coronary arteriosclerosis along the LAD and left circumflex. Mediastinum/Nodes: Left lower paratracheal adenopathy likely reactive measuring 13 mm short axis. Smaller AP window and right paratracheal lymph nodes are present in addition small bilateral hilar lymph nodes. The trachea is patent. The mainstem bronchi are unremarkable and patent. The esophagus is nonacute. Lungs/Pleura: Centrilobular emphysema, upper lobe predominant. Patchy airspace consolidations in the lingula and left lower lobe with trace pleural effusions. Musculoskeletal: No chest wall abnormality. No acute or significant osseous findings. Review of the MIP images confirms the above findings. CT ABDOMEN and PELVIS FINDINGS Hepatobiliary: No focal liver abnormality is seen. Status post cholecystectomy. No biliary dilatation. Pancreas: Unremarkable. No pancreatic ductal dilatation or surrounding inflammatory changes. Spleen: Normal in size without focal abnormality. Adrenals/Urinary Tract: Subcentimeter renal cysts too small to further characterize. No enhancing mass lesions of either kidney. Normal adrenal glands bilaterally. No nephrolithiasis nor  obstructive uropathy. Decompressed urinary bladder, unremarkable for degree of distention. Stomach/Bowel: Moderate fecal retention within the colon without bowel obstruction or inflammation. The stomach is decompressed. There is a small hiatal hernia. Normal small bowel rotation without bowel obstruction or inflammation. Normal appendix. Vascular/Lymphatic: Moderate aortoiliac atherosclerosis. No abdominopelvic  adenopathy. Reproductive: Peripheral zone calcifications noted of the normal size prostate. Seminal vesicles are unremarkable. Other: No free air nor free fluid. Fat containing bilateral inguinal hernias. Musculoskeletal: Mild physiologic anterior wedging of T11 with associated degenerative disc disease at T9-10, T10-11 and T11-12. Degenerative disc disease also noted at L5-S1. Review of the MIP images confirms the above findings. IMPRESSION: Chest CT: 1. Positive for acute PE within the lingula with CT evidence of right heart strain (RV/LV Ratio = 1) consistent with at least submassive (intermediate risk) PE. These results were called by telephone at the time of interpretation on 11/11/2017 at 7:00 pm to Dr. Carrie Mew , who verbally acknowledged these results. 2. Lingular and left lower lobe airspace disease consistent with pneumonia with reactive left lower paratracheal adenopathy. Differential considerations may include a pulmonary infarct but suspect pneumonia given what appears to be the reactive adenopathy. 3. Coronary arteriosclerosis and aortic atherosclerosis. 4. Mild centrilobular emphysema bilaterally. CT AP: 1. Bilateral small subcentimeter hypodensities within both kidneys statistically consistent with cysts but are too small to further characterize. 2. No acute bowel obstruction or inflammation. 3. Thoracolumbar degenerative disc disease without acute osseous abnormality. Electronically Signed   By: Ashley Royalty M.D.   On: 11/11/2017 19:01   US Venous Img Lower Bilateral  Result Date: 11/12/2017 CLINICAL DATA:  History of pulmonary embolism. Evaluate for DVT. History of fall 6 weeks ago. Former smoker. EXAM: BILATERAL LOWER EXTREMITY VENOUS DOPPLER ULTRASOUND TECHNIQUE: Gray-scale sonography with graded compression, as well as color Doppler and duplex ultrasound were performed to evaluate the lower extremity deep venous systems from the level of the common femoral vein and including the common  femoral, femoral, profunda femoral, popliteal and calf veins including the posterior tibial, peroneal and gastrocnemius veins when visible. The superficial great saphenous vein was also interrogated. Spectral Doppler was utilized to evaluate flow at rest and with distal augmentation maneuvers in the common femoral, femoral and popliteal veins. COMPARISON:  Chest CT-11/11/2017 FINDINGS: RIGHT LOWER EXTREMITY Common Femoral Vein: No evidence of thrombus. Normal compressibility, respiratory phasicity and response to augmentation. Saphenofemoral Junction: No evidence of thrombus. Normal compressibility and flow on color Doppler imaging. Profunda Femoral Vein: No evidence of thrombus. Normal compressibility and flow on color Doppler imaging. Femoral Vein: No evidence of thrombus. Normal compressibility, respiratory phasicity and response to augmentation. Popliteal Vein: No evidence of thrombus. Normal compressibility, respiratory phasicity and response to augmentation. Calf Veins: No evidence of thrombus. Normal compressibility and flow on color Doppler imaging. Superficial Great Saphenous Vein: No evidence of thrombus. Normal compressibility. Venous Reflux:  None. Other Findings:  None. LEFT LOWER EXTREMITY Common Femoral Vein: No evidence of thrombus. Normal compressibility, respiratory phasicity and response to augmentation. Saphenofemoral Junction: No evidence of thrombus. Normal compressibility and flow on color Doppler imaging. Profunda Femoral Vein: No evidence of thrombus. Normal compressibility and flow on color Doppler imaging. Femoral Vein: No evidence of thrombus. Normal compressibility, respiratory phasicity and response to augmentation. Popliteal Vein: No evidence of thrombus. Normal compressibility, respiratory phasicity and response to augmentation. Calf Veins: Age-indeterminate hypoechoic occlusive thrombus is noted within 1 of the paired left peroneal veins (images 65 and 66). The paired posterior  tibial veins appear patent. Superficial  Great Saphenous Vein: No evidence of thrombus. Normal compressibility. Venous Reflux:  None. Other Findings:  None. IMPRESSION: 1. Examination is positive for age-indeterminate occlusive DVT within one of the paired left peroneal veins. There is no extension of this short-segment distal tibial DVT to the more proximal venous system of the left lower extremity. 2. No evidence of DVT within the right lower extremity. Electronically Signed   By: Sandi Mariscal M.D.   On: 11/12/2017 11:37   Dg Chest Port 1 View  Result Date: 11/11/2017 CLINICAL DATA:  Abdominal pain. EXAM: PORTABLE CHEST 1 VIEW COMPARISON:  11/11/2017 and prior radiographs. FINDINGS: This is a low volume film with LEFT basilar atelectasis. There may be a trace LEFT pleural effusion present. No pneumothorax or pulmonary mass identified. No acute bony abnormalities are present. IMPRESSION: Low volume film with bibasilar atelectasis and LEFT basilar atelectasis and possible trace LEFT pleural effusion. Electronically Signed   By: Margarette Canada M.D.   On: 11/11/2017 18:10    Scheduled Meds: . azelastine  2 spray Each Nare BID  . azithromycin  250 mg Oral Daily  . ezetimibe  10 mg Oral Daily  . finasteride  5 mg Oral Daily  . levothyroxine  75 mcg Oral QAC breakfast   Continuous Infusions: . cefTRIAXone (ROCEPHIN)  IV Stopped (11/11/17 1955)  . heparin 1,300 Units/hr (11/12/17 1129)    Assessment/Plan:  1. Acute pulmonary embolism.  Continue heparin drip at this point since the patient is still having symptoms with pleuritic chest pain.  Hopefully can convert over to Eliquis upon disposition.   2. DVT of the left lower extremity.  Consult vascular surgery with a history of pulmonary embolism and DVT on whether an IVC filter is warranted.  3. Pneumonia versus pulmonary infarct.  Antibiotics Rocephin and Zithromax for pneumonia.  Blood thinner for pulmonary embolism.  Pain control. 4. Chronic  dysphasia.  Spoke with speech therapist and dietitian about the patient drinking shake like consistency at home.  The patient did not do well with the full liquid diet because this is not what he normally eats. 5. Hypothyroidism unspecified on levothyroxine 6. Hyperlipidemia unspecified on Zetia and Praluent at home 7. BPH on finasteride 8. Anxiety on clonazepam  Code Status:     Code Status Orders  (From admission, onward)        Start     Ordered   11/11/17 2046  Full code  Continuous     11/11/17 2045    Code Status History    Date Active Date Inactive Code Status Order ID Comments User Context   02/22/2015 1151 02/22/2015 1740 Full Code [id number]  Minna Merritts, MD Inpatient    Advance Directive Documentation     Most Recent Value  Type of Advance Directive  Living will, Healthcare Power of Attorney, Out of facility DNR (pink MOST or yellow form)  Pre-existing out of facility DNR order (yellow form or pink MOST form)  -  "MOST" Form in Place?  -     Family Communication: Wife at bedside Disposition Plan: Potentially home when having less pleuritic chest pain  Consultants:  Vascular surgery  Time spent: 35 minutes, case discussed with vascular surgery, nursing staff dietary and speech pathology  Kaaren Nass [employer]

## 2017-11-12 NOTE — Progress Notes (Signed)
[place] for heparin Indication: pulmonary embolus  Allergies  Allergen Reactions  . Duloxetine     Unable to urinate  . Nsaids Other (See Comments)    CANNOT TAKE NSAIDS BECAUSE OF ESOPHAGUS ISSUES CANNOT TAKE NSAIDS BECAUSE OF ESOPHAGUS ISSUES  . Rosuvastatin Other (See Comments)    SEVERE MUSCLE ACHES  . Statins Other (See Comments)    SEVERE MUSCLE ACHES  . Tolmetin Other (See Comments)    CANNOT TAKE NSAIDS BECAUSE OF ESOPHAGUS ISSUES    Patient Measurements: Height: 6' (182.9 cm) Weight: 178 lb 14.4 oz (81.1 kg) IBW/kg (Calculated) : 77.6  Vital Signs: Temp: 98 F (36.7 C) (05/24 0348) Temp Source: Oral (05/24 0348) BP: 144/77 (05/24 0348) Pulse Rate: 73 (05/24 0348)  Labs: Recent Labs    11/11/17 1120 11/11/17 1804 11/11/17 2116 11/12/17 0358  HGB 14.9 14.8  --  13.9  HCT 44.8 44.9  --  41.1  PLT 239.0 223  --  199  LABPROT  --   --  14.0  --   INR  --   --  1.09  --   HEPARINUNFRC  --   --   --  0.77*  CREATININE 1.54* 1.42*  --  1.54*  CKTOTAL 96  --   --   --     Estimated Creatinine Clearance: 46.2 mL/min (A) (by C-G formula based on SCr of 1.54 mg/dL (H)).   Medical History: Past Medical History:  Diagnosis Date  . Aspiration precautions    uses thicken in liquids.  Eats pureed food.  . Barrett esophagus   . Depression   . Gallstones   . GERD (gastroesophageal reflux disease)   . Heart murmur    History of  . Hemorrhoids   . Hypertension   . Hypothyroidism   . Iron deficiency anemia 02/10/2012  . Mitral valve disease   . Myalgia    Multiple  . Neuromuscular disorder ([place])   . PAD (peripheral artery disease) ([facility])    lower legs  . Pain syndrome, chronic    Severe  . Pneumonia    three times, last time >10 years ago  . Psoriasis   . Rheumatic heart valve incompetence   . Shortness of breath dyspnea   . Thyroiditis     Medications:   On no anticoagulants PTA  Assessment: 76 YOM with  sepsis and PE Goal of Therapy:  Heparin level 0.3-0.7 units/ml  Monitor platelets by anticoagulation protocol: Yes   Plan:  05/24 @ 0400 HL 0.77 supratherapeutic. Will decrease rate to 1300 units/hr and will recheck HL @ 1300.  Tobie Lords, PharmD, BCPS Clinical Pharmacist 11/12/2017

## 2017-11-12 NOTE — Telephone Encounter (Signed)
FYI patient was admitted

## 2017-11-13 LAB — URINE CULTURE: Culture: NO GROWTH

## 2017-11-13 LAB — HEPARIN LEVEL (UNFRACTIONATED)
HEPARIN UNFRACTIONATED: 0.31 [IU]/mL (ref 0.30–0.70)
HEPARIN UNFRACTIONATED: 0.32 [IU]/mL (ref 0.30–0.70)

## 2017-11-13 MED ORDER — BISACODYL 10 MG RE SUPP
10.0000 mg | Freq: Once | RECTAL | Status: AC
  Administered 2017-11-13: 10 mg via RECTAL
  Filled 2017-11-13: qty 1

## 2017-11-13 MED ORDER — HYDRALAZINE HCL 20 MG/ML IJ SOLN: 10.0000 mg | Freq: Four times a day (QID) | INTRAMUSCULAR | Status: DC | PRN

## 2017-11-13 MED ORDER — METHYLPREDNISOLONE SODIUM SUCC 40 MG IJ SOLR
40.0000 mg | Freq: Every day | INTRAMUSCULAR | Status: DC
  Administered 2017-11-13 – 2017-11-14 (×2): 40 mg via INTRAVENOUS
  Filled 2017-11-13 (×2): qty 1

## 2017-11-13 MED ORDER — BENZONATATE 100 MG PO CAPS: 100.0000 mg | ORAL_CAPSULE | Freq: Three times a day (TID) | ORAL | Status: DC

## 2017-11-13 NOTE — Plan of Care (Signed)
  Problem: Clinical Measurements: Goal: Ability to maintain clinical measurements within normal limits will improve Outcome: Not Progressing Note:  Creatinine level is elevated today at 1.5. Will continue to monitor renal function labs. Wenda Low [facility]

## 2017-11-13 NOTE — Progress Notes (Signed)
Patient ID: Timothy Turner, male   DOB: 1941/06/06, 77 y.o.   MRN: [id number]  [facility] PROGRESS NOTE  Timothy Turner NOB:[id number] DOB: 01/05/1941 DOA: 11/11/2017 PCP: Leone Haven, MD  HPI/Subjective: Patient still not feeling well.  Still having lots of chest pain especially when he moves coughs or does anything.  Sometimes if he holds his chest it will help out.  Complaining of constipation.  Still not happy about what dietary is giving him on his meals.  Objective: Vitals:   11/13/17 0506 11/13/17 0917  BP: 128/70 114/73  Pulse: 72 77  Resp:  18  Temp: 98.2 F (36.8 C) 98.9 F (37.2 C)  SpO2: 91% 91%    Filed Weights   11/11/17 1621 11/11/17 2053  Weight: 80.7 kg (178 lb) 81.1 kg (178 lb 14.4 oz)    ROS: Review of Systems  Constitutional: Negative for chills and fever.  Eyes: Negative for blurred vision.  Respiratory: Positive for cough and shortness of breath.   Cardiovascular: Positive for chest pain.  Gastrointestinal: Negative for abdominal pain, constipation, diarrhea, nausea and vomiting.  Genitourinary: Negative for dysuria.  Musculoskeletal: Negative for joint pain.  Neurological: Negative for dizziness and headaches.   Exam: Physical Exam  Constitutional: He is oriented to person, place, and time.  HENT:  Nose: No mucosal edema.  Mouth/Throat: No oropharyngeal exudate or posterior oropharyngeal edema.  Eyes: Pupils are equal, round, and reactive to light. Conjunctivae, EOM and lids are normal.  Neck: No JVD present. Carotid bruit is not present. No edema present. No thyroid mass and no thyromegaly present.  Cardiovascular: S1 normal and S2 normal. Exam reveals no gallop.  No murmur heard. Pulses:      Dorsalis pedis pulses are 2+ on the right side, and 2+ on the left side.  Respiratory: No respiratory distress. He has decreased breath sounds in the right lower field and the left lower field. He has no wheezes. He has rhonchi in  the left lower field. He has no rales.  GI: Soft. Bowel sounds are normal. There is no tenderness.  Musculoskeletal:       Right ankle: He exhibits no swelling.       Left ankle: He exhibits no swelling.  Lymphadenopathy:    He has no cervical adenopathy.  Neurological: He is alert and oriented to person, place, and time. No cranial nerve deficit.  Skin: Skin is warm. No rash noted. Nails show no clubbing.  Psychiatric: He has a normal mood and affect.      Data Reviewed: Basic Metabolic Panel: Recent Labs  Lab 11/11/17 1120 11/11/17 1804 11/12/17 0358  NA 138 139 139  K 4.4 4.4 3.8  CL 102 101 102  CO2 30 28 28   GLUCOSE 155* 123* 105*  BUN 22 21* 19  CREATININE 1.54* 1.42* 1.54*  CALCIUM 9.9 9.5 9.0   Liver Function Tests: Recent Labs  Lab 11/11/17 1120 11/11/17 1804  AST 20 27  ALT 15 19  ALKPHOS 69 69  BILITOT 0.6 0.5  PROT 7.0 7.2  ALBUMIN 4.1 4.2   Recent Labs  Lab 11/11/17 1120 11/11/17 1804  LIPASE 16.0 23   CBC: Recent Labs  Lab 11/11/17 1120 11/11/17 1804 11/12/17 0358  WBC 13.3* 14.5* 12.9*  NEUTROABS 8.9* 9.7*  --   HGB 14.9 14.8 13.9  HCT 44.8 44.9 41.1  MCV 88.4 87.9 88.5  PLT 239.0 223 199   Cardiac Enzymes: Recent Labs  Lab 11/11/17 1120  CKTOTAL  96     Recent Results (from the past 240 hour(s))  Urine culture     Status: None   Collection Time: 11/11/17  6:04 PM  Result Value Ref Range Status   Specimen Description   Final    URINE, RANDOM Performed at [facility], [address], [place], [place] [address]    Special Requests   Final    NONE Performed at [facility], [address] [place], [place] [address]    Culture   Final    NO GROWTH Performed at [facility], [place] [address], [place], [place] [address]    Report Status 11/13/2017 FINAL  Final  Blood culture (routine single)     Status: None (Preliminary result)   Collection Time: 11/11/17  6:04 PM  Result Value Ref Range Status    Specimen Description BLOOD BLOOD RIGHT FOREARM  Final   Special Requests   Final    Blood Culture results may not be optimal due to an excessive volume of blood received in culture bottles   Culture   Final    NO GROWTH 2 DAYS Performed at [facility], [address], [place], [place] [address]    Report Status PENDING  Incomplete  Blood culture (single)     Status: None (Preliminary result)   Collection Time: 11/11/17  7:22 PM  Result Value Ref Range Status   Specimen Description BLOOD BLOOD LEFT FOREARM  Final   Special Requests   Final    BOTTLES DRAWN AEROBIC AND ANAEROBIC Blood Culture results may not be optimal due to an excessive volume of blood received in culture bottles   Culture   Final    NO GROWTH 2 DAYS Performed at [facility], [address], [place], [place] [address]    Report Status PENDING  Incomplete     Studies: Ct Angio Chest Pe W And/or Wo Contrast  Result Date: 11/11/2017 CLINICAL DATA:  78 year old male presents with left upper quadrant pain starting yesterday now severe and intermittent. Dyspnea also noted. EXAM: CT ANGIOGRAPHY CHEST CT ABDOMEN AND PELVIS WITH CONTRAST TECHNIQUE: Multidetector CT imaging of the chest was performed using the standard protocol during bolus administration of intravenous contrast. Multiplanar CT image reconstructions and MIPs were obtained to evaluate the vascular anatomy. Multidetector CT imaging of the abdomen and pelvis was performed using the standard protocol during bolus administration of intravenous contrast. CONTRAST:  188mL ISOVUE-370 IOPAMIDOL (ISOVUE-370) INJECTION 76% COMPARISON:  CT AP 05/20/2017 FINDINGS: CTA CHEST FINDINGS Cardiovascular: Conventional branch pattern of the great vessels without stenosis. Nonaneurysmal atherosclerotic aorta. Satisfactory opacification of the pulmonary arteries. Pulmonary embolus within a lingular branch of the pulmonary arterial system. RV/LV ratio equals 1. Heart  is mildly enlarged without pericardial effusion. There is left main with coronary arteriosclerosis along the LAD and left circumflex. Mediastinum/Nodes: Left lower paratracheal adenopathy likely reactive measuring 13 mm short axis. Smaller AP window and right paratracheal lymph nodes are present in addition small bilateral hilar lymph nodes. The trachea is patent. The mainstem bronchi are unremarkable and patent. The esophagus is nonacute. Lungs/Pleura: Centrilobular emphysema, upper lobe predominant. Patchy airspace consolidations in the lingula and left lower lobe with trace pleural effusions. Musculoskeletal: No chest wall abnormality. No acute or significant osseous findings. Review of the MIP images confirms the above findings. CT ABDOMEN and PELVIS FINDINGS Hepatobiliary: No focal liver abnormality is seen. Status post cholecystectomy. No biliary dilatation. Pancreas: Unremarkable. No pancreatic ductal dilatation or surrounding inflammatory  changes. Spleen: Normal in size without focal abnormality. Adrenals/Urinary Tract: Subcentimeter renal cysts too small to further characterize. No enhancing mass lesions of either kidney. Normal adrenal glands bilaterally. No nephrolithiasis nor obstructive uropathy. Decompressed urinary bladder, unremarkable for degree of distention. Stomach/Bowel: Moderate fecal retention within the colon without bowel obstruction or inflammation. The stomach is decompressed. There is a small hiatal hernia. Normal small bowel rotation without bowel obstruction or inflammation. Normal appendix. Vascular/Lymphatic: Moderate aortoiliac atherosclerosis. No abdominopelvic adenopathy. Reproductive: Peripheral zone calcifications noted of the normal size prostate. Seminal vesicles are unremarkable. Other: No free air nor free fluid. Fat containing bilateral inguinal hernias. Musculoskeletal: Mild physiologic anterior wedging of T11 with associated degenerative disc disease at T9-10, T10-11 and  T11-12. Degenerative disc disease also noted at L5-S1. Review of the MIP images confirms the above findings. IMPRESSION: Chest CT: 1. Positive for acute PE within the lingula with CT evidence of right heart strain (RV/LV Ratio = 1) consistent with at least submassive (intermediate risk) PE. These results were called by telephone at the time of interpretation on 11/11/2017 at 7:00 pm to Dr. Carrie Mew , who verbally acknowledged these results. 2. Lingular and left lower lobe airspace disease consistent with pneumonia with reactive left lower paratracheal adenopathy. Differential considerations may include a pulmonary infarct but suspect pneumonia given what appears to be the reactive adenopathy. 3. Coronary arteriosclerosis and aortic atherosclerosis. 4. Mild centrilobular emphysema bilaterally. CT AP: 1. Bilateral small subcentimeter hypodensities within both kidneys statistically consistent with cysts but are too small to further characterize. 2. No acute bowel obstruction or inflammation. 3. Thoracolumbar degenerative disc disease without acute osseous abnormality. Electronically Signed   By: Ashley Royalty M.D.   On: 11/11/2017 19:01   Ct Abdomen Pelvis W Contrast  Result Date: 11/11/2017 CLINICAL DATA:  77 year old male presents with left upper quadrant pain starting yesterday now severe and intermittent. Dyspnea also noted. EXAM: CT ANGIOGRAPHY CHEST CT ABDOMEN AND PELVIS WITH CONTRAST TECHNIQUE: Multidetector CT imaging of the chest was performed using the standard protocol during bolus administration of intravenous contrast. Multiplanar CT image reconstructions and MIPs were obtained to evaluate the vascular anatomy. Multidetector CT imaging of the abdomen and pelvis was performed using the standard protocol during bolus administration of intravenous contrast. CONTRAST:  119mL ISOVUE-370 IOPAMIDOL (ISOVUE-370) INJECTION 76% COMPARISON:  CT AP 05/20/2017 FINDINGS: CTA CHEST FINDINGS Cardiovascular:  Conventional branch pattern of the great vessels without stenosis. Nonaneurysmal atherosclerotic aorta. Satisfactory opacification of the pulmonary arteries. Pulmonary embolus within a lingular branch of the pulmonary arterial system. RV/LV ratio equals 1. Heart is mildly enlarged without pericardial effusion. There is left main with coronary arteriosclerosis along the LAD and left circumflex. Mediastinum/Nodes: Left lower paratracheal adenopathy likely reactive measuring 13 mm short axis. Smaller AP window and right paratracheal lymph nodes are present in addition small bilateral hilar lymph nodes. The trachea is patent. The mainstem bronchi are unremarkable and patent. The esophagus is nonacute. Lungs/Pleura: Centrilobular emphysema, upper lobe predominant. Patchy airspace consolidations in the lingula and left lower lobe with trace pleural effusions. Musculoskeletal: No chest wall abnormality. No acute or significant osseous findings. Review of the MIP images confirms the above findings. CT ABDOMEN and PELVIS FINDINGS Hepatobiliary: No focal liver abnormality is seen. Status post cholecystectomy. No biliary dilatation. Pancreas: Unremarkable. No pancreatic ductal dilatation or surrounding inflammatory changes. Spleen: Normal in size without focal abnormality. Adrenals/Urinary Tract: Subcentimeter renal cysts too small to further characterize. No enhancing mass lesions of either kidney. Normal adrenal glands bilaterally.  No nephrolithiasis nor obstructive uropathy. Decompressed urinary bladder, unremarkable for degree of distention. Stomach/Bowel: Moderate fecal retention within the colon without bowel obstruction or inflammation. The stomach is decompressed. There is a small hiatal hernia. Normal small bowel rotation without bowel obstruction or inflammation. Normal appendix. Vascular/Lymphatic: Moderate aortoiliac atherosclerosis. No abdominopelvic adenopathy. Reproductive: Peripheral zone calcifications noted  of the normal size prostate. Seminal vesicles are unremarkable. Other: No free air nor free fluid. Fat containing bilateral inguinal hernias. Musculoskeletal: Mild physiologic anterior wedging of T11 with associated degenerative disc disease at T9-10, T10-11 and T11-12. Degenerative disc disease also noted at L5-S1. Review of the MIP images confirms the above findings. IMPRESSION: Chest CT: 1. Positive for acute PE within the lingula with CT evidence of right heart strain (RV/LV Ratio = 1) consistent with at least submassive (intermediate risk) PE. These results were called by telephone at the time of interpretation on 11/11/2017 at 7:00 pm to Dr. Carrie Mew , who verbally acknowledged these results. 2. Lingular and left lower lobe airspace disease consistent with pneumonia with reactive left lower paratracheal adenopathy. Differential considerations may include a pulmonary infarct but suspect pneumonia given what appears to be the reactive adenopathy. 3. Coronary arteriosclerosis and aortic atherosclerosis. 4. Mild centrilobular emphysema bilaterally. CT AP: 1. Bilateral small subcentimeter hypodensities within both kidneys statistically consistent with cysts but are too small to further characterize. 2. No acute bowel obstruction or inflammation. 3. Thoracolumbar degenerative disc disease without acute osseous abnormality. Electronically Signed   By: Ashley Royalty M.D.   On: 11/11/2017 19:01   US Venous Img Lower Bilateral  Result Date: 11/12/2017 CLINICAL DATA:  History of pulmonary embolism. Evaluate for DVT. History of fall 6 weeks ago. Former smoker. EXAM: BILATERAL LOWER EXTREMITY VENOUS DOPPLER ULTRASOUND TECHNIQUE: Gray-scale sonography with graded compression, as well as color Doppler and duplex ultrasound were performed to evaluate the lower extremity deep venous systems from the level of the common femoral vein and including the common femoral, femoral, profunda femoral, popliteal and calf veins  including the posterior tibial, peroneal and gastrocnemius veins when visible. The superficial great saphenous vein was also interrogated. Spectral Doppler was utilized to evaluate flow at rest and with distal augmentation maneuvers in the common femoral, femoral and popliteal veins. COMPARISON:  Chest CT-11/11/2017 FINDINGS: RIGHT LOWER EXTREMITY Common Femoral Vein: No evidence of thrombus. Normal compressibility, respiratory phasicity and response to augmentation. Saphenofemoral Junction: No evidence of thrombus. Normal compressibility and flow on color Doppler imaging. Profunda Femoral Vein: No evidence of thrombus. Normal compressibility and flow on color Doppler imaging. Femoral Vein: No evidence of thrombus. Normal compressibility, respiratory phasicity and response to augmentation. Popliteal Vein: No evidence of thrombus. Normal compressibility, respiratory phasicity and response to augmentation. Calf Veins: No evidence of thrombus. Normal compressibility and flow on color Doppler imaging. Superficial Great Saphenous Vein: No evidence of thrombus. Normal compressibility. Venous Reflux:  None. Other Findings:  None. LEFT LOWER EXTREMITY Common Femoral Vein: No evidence of thrombus. Normal compressibility, respiratory phasicity and response to augmentation. Saphenofemoral Junction: No evidence of thrombus. Normal compressibility and flow on color Doppler imaging. Profunda Femoral Vein: No evidence of thrombus. Normal compressibility and flow on color Doppler imaging. Femoral Vein: No evidence of thrombus. Normal compressibility, respiratory phasicity and response to augmentation. Popliteal Vein: No evidence of thrombus. Normal compressibility, respiratory phasicity and response to augmentation. Calf Veins: Age-indeterminate hypoechoic occlusive thrombus is noted within 1 of the paired left peroneal veins (images 65 and 66). The paired posterior tibial veins appear  patent. Superficial Great Saphenous Vein: No  evidence of thrombus. Normal compressibility. Venous Reflux:  None. Other Findings:  None. IMPRESSION: 1. Examination is positive for age-indeterminate occlusive DVT within one of the paired left peroneal veins. There is no extension of this short-segment distal tibial DVT to the more proximal venous system of the left lower extremity. 2. No evidence of DVT within the right lower extremity. Electronically Signed   By: Sandi Mariscal M.D.   On: 11/12/2017 11:37   Dg Chest Port 1 View  Result Date: 11/11/2017 CLINICAL DATA:  Abdominal pain. EXAM: PORTABLE CHEST 1 VIEW COMPARISON:  11/11/2017 and prior radiographs. FINDINGS: This is a low volume film with LEFT basilar atelectasis. There may be a trace LEFT pleural effusion present. No pneumothorax or pulmonary mass identified. No acute bony abnormalities are present. IMPRESSION: Low volume film with bibasilar atelectasis and LEFT basilar atelectasis and possible trace LEFT pleural effusion. Electronically Signed   By: Margarette Canada M.D.   On: 11/11/2017 18:10    Scheduled Meds: . azelastine  2 spray Each Nare BID  . azithromycin  250 mg Oral Daily  . ezetimibe  10 mg Oral Daily  . finasteride  5 mg Oral Daily  . levothyroxine  75 mcg Oral QAC breakfast  . methylPREDNISolone (SOLU-MEDROL) injection  40 mg Intravenous Daily   Continuous Infusions: . cefTRIAXone (ROCEPHIN)  IV Stopped (11/12/17 2221)  . heparin 1,350 Units/hr (11/13/17 0737)    Assessment/Plan:  1. Acute pulmonary embolism.  Continue heparin drip at this point since the patient is still having symptoms with pleuritic chest pain.  Hopefully can convert over to Eliquis upon disposition.  Add Solu-Medrol just in case pleuritic component is inflammatory. 2. DVT of the left lower extremity.  On blood thinner 3. Pneumonia versus pulmonary infarct.  Antibiotics Rocephin and Zithromax for pneumonia.  Blood thinner for pulmonary embolism.  Pain control.  Add Solu-Medrol just in case pleuritic  component is inflammatory.   4. Chronic dysphasia.  Spoke with dietary at the kitchen to call the patient to see what he wants for each meal. 5. Hypothyroidism unspecified on levothyroxine 6. Hyperlipidemia unspecified on Zetia and Praluent at home 7. BPH on finasteride 8. Anxiety on clonazepam 9. Constipation Dulcolax suppository  Code Status:     Code Status Orders  (From admission, onward)        Start     Ordered   11/11/17 2046  Full code  Continuous     11/11/17 2045    Code Status History    Date Active Date Inactive Code Status Order ID Comments User Context   02/22/2015 1151 02/22/2015 1740 Full Code [id number]  Minna Merritts, MD Inpatient    Advance Directive Documentation     Most Recent Value  Type of Advance Directive  Living will, Healthcare Power of Attorney, Out of facility DNR (pink MOST or yellow form)  Pre-existing out of facility DNR order (yellow form or pink MOST form)  -  "MOST" Form in Place?  -     Family Communication: Wife at bedside Disposition Plan:  evaluate daily  Consultants:  Vascular surgery  Time spent:  28 minutes  [facility]           Patient ID: Timothy Turner, male   DOB: February 24, 1941, 77 y.o.   MRN: [id number]

## 2017-11-13 NOTE — Progress Notes (Signed)
[place] for heparin Indication: pulmonary embolus  Allergies  Allergen Reactions  . Duloxetine     Unable to urinate  . Nsaids Other (See Comments)    CANNOT TAKE NSAIDS BECAUSE OF ESOPHAGUS ISSUES CANNOT TAKE NSAIDS BECAUSE OF ESOPHAGUS ISSUES  . Rosuvastatin Other (See Comments)    SEVERE MUSCLE ACHES  . Statins Other (See Comments)    SEVERE MUSCLE ACHES  . Tolmetin Other (See Comments)    CANNOT TAKE NSAIDS BECAUSE OF ESOPHAGUS ISSUES    Patient Measurements: Height: 6' (182.9 cm) Weight: 178 lb 14.4 oz (81.1 kg) IBW/kg (Calculated) : 77.6  Vital Signs: Temp: 98.6 F (37 C) (05/25 1634) Temp Source: Oral (05/25 1634) BP: 153/84 (05/25 1634) Pulse Rate: 78 (05/25 1634)  Labs: Recent Labs    11/11/17 1120 11/11/17 1804 11/11/17 2116 11/12/17 0358  11/12/17 2021 11/13/17 0459 11/13/17 1357  HGB 14.9 14.8  --  13.9  --   --   --   --   HCT 44.8 44.9  --  41.1  --   --   --   --   PLT 239.0 223  --  199  --   --   --   --   LABPROT  --   --  14.0  --   --   --   --   --   INR  --   --  1.09  --   --   --   --   --   HEPARINUNFRC  --   --   --  0.77*   < > 0.41 0.31 0.32  CREATININE 1.54* 1.42*  --  1.54*  --   --   --   --   CKTOTAL 96  --   --   --   --   --   --   --    < > = values in this interval not displayed.    Estimated Creatinine Clearance: 46.2 mL/min (A) (by C-G formula based on SCr of 1.54 mg/dL (H)).   Medical History: Past Medical History:  Diagnosis Date  . Aspiration precautions    uses thicken in liquids.  Eats pureed food.  . Barrett esophagus   . Depression   . Gallstones   . GERD (gastroesophageal reflux disease)   . Heart murmur    History of  . Hemorrhoids   . Hypertension   . Hypothyroidism   . Iron deficiency anemia 02/10/2012  . Mitral valve disease   . Myalgia    Multiple  . Neuromuscular disorder ([place])   . PAD (peripheral artery disease) ([facility])    lower legs  . Pain syndrome,  chronic    Severe  . Pneumonia    three times, last time >10 years ago  . Psoriasis   . Rheumatic heart valve incompetence   . Shortness of breath dyspnea   . Thyroiditis     Medications:   On no anticoagulants PTA  Assessment: 76 YOM with sepsis and PE Goal of Therapy:  Heparin level 0.3-0.7 units/ml  Monitor platelets by anticoagulation protocol: Yes   Plan:  05/24 @ 0400 HL 0.77 supratherapeutic. Will decrease rate to 1300 units/hr and will recheck HL @ 1300.  05/24 @ 1408 HL 0.36. Level is therapeutic. Will continue current infusion rate of 1300 units/hr Recheck HL in 6 hours.   05/24 @ 2021 HL 0.41.   Will continue pt on current rate and recheck HL on  5/25 with AM labs.   05/25 @ 0500 HL 0.31 therapeutic. Considering patient has a PE and HL has been trending down, Will incease rate slightly to 1350 units/hr and will recheck @ 1400.   5/25@1900  hl 0.32, recheck with am labs. Therapeutic, continue heparin 1350units/hr.   Thomasenia Sales, PharmD, Clinical Pharmacist 11/13/2017 7:10 PM

## 2017-11-13 NOTE — Plan of Care (Signed)
  Problem: Education: Goal: Knowledge of General Education information will improve Outcome: Progressing   Problem: Health Behavior/Discharge Planning: Goal: Ability to manage health-related needs will improve Outcome: Progressing   Problem: Clinical Measurements: Goal: Ability to maintain clinical measurements within normal limits will improve Outcome: Progressing Goal: Diagnostic test results will improve Outcome: Progressing Goal: Respiratory complications will improve Outcome: Progressing Goal: Cardiovascular complication will be avoided Outcome: Progressing   Problem: Activity: Goal: Risk for activity intolerance will decrease Outcome: Progressing

## 2017-11-13 NOTE — Progress Notes (Addendum)
[place] for heparin Indication: pulmonary embolus  Allergies  Allergen Reactions  . Duloxetine     Unable to urinate  . Nsaids Other (See Comments)    CANNOT TAKE NSAIDS BECAUSE OF ESOPHAGUS ISSUES CANNOT TAKE NSAIDS BECAUSE OF ESOPHAGUS ISSUES  . Rosuvastatin Other (See Comments)    SEVERE MUSCLE ACHES  . Statins Other (See Comments)    SEVERE MUSCLE ACHES  . Tolmetin Other (See Comments)    CANNOT TAKE NSAIDS BECAUSE OF ESOPHAGUS ISSUES    Patient Measurements: Height: 6' (182.9 cm) Weight: 178 lb 14.4 oz (81.1 kg) IBW/kg (Calculated) : 77.6  Vital Signs: Temp: 98.2 F (36.8 C) (05/25 0506) Temp Source: Oral (05/25 0506) BP: 128/70 (05/25 0506) Pulse Rate: 72 (05/25 0506)  Labs: Recent Labs    11/11/17 1120 11/11/17 1804 11/11/17 2116  11/12/17 0358 11/12/17 1408 11/12/17 2021 11/13/17 0459  HGB 14.9 14.8  --   --  13.9  --   --   --   HCT 44.8 44.9  --   --  41.1  --   --   --   PLT 239.0 223  --   --  199  --   --   --   LABPROT  --   --  14.0  --   --   --   --   --   INR  --   --  1.09  --   --   --   --   --   HEPARINUNFRC  --   --   --    < > 0.77* 0.36 0.41 0.31  CREATININE 1.54* 1.42*  --   --  1.54*  --   --   --   CKTOTAL 96  --   --   --   --   --   --   --    < > = values in this interval not displayed.    Estimated Creatinine Clearance: 46.2 mL/min (A) (by C-G formula based on SCr of 1.54 mg/dL (H)).   Medical History: Past Medical History:  Diagnosis Date  . Aspiration precautions    uses thicken in liquids.  Eats pureed food.  . Barrett esophagus   . Depression   . Gallstones   . GERD (gastroesophageal reflux disease)   . Heart murmur    History of  . Hemorrhoids   . Hypertension   . Hypothyroidism   . Iron deficiency anemia 02/10/2012  . Mitral valve disease   . Myalgia    Multiple  . Neuromuscular disorder ([place])   . PAD (peripheral artery disease) (HCC)    lower legs  . Pain syndrome,  chronic    Severe  . Pneumonia    three times, last time >10 years ago  . Psoriasis   . Rheumatic heart valve incompetence   . Shortness of breath dyspnea   . Thyroiditis     Medications:   On no anticoagulants PTA  Assessment: 76 YOM with sepsis and PE Goal of Therapy:  Heparin level 0.3-0.7 units/ml  Monitor platelets by anticoagulation protocol: Yes   Plan:  05/24 @ 0400 HL 0.77 supratherapeutic. Will decrease rate to 1300 units/hr and will recheck HL @ 1300.  05/24 @ 1408 HL 0.36. Level is therapeutic. Will continue current infusion rate of 1300 units/hr Recheck HL in 6 hours.   05/24 @ 2021 HL 0.41.   Will continue pt on current rate and recheck HL on  5/25 with AM labs.   05/25 @ 0500 HL 0.31 therapeutic. Considering patient has a PE and HL has been trending down, Will incease rate slightly to 1350 units/hr and will recheck @ 1400.   Tobie Lords, PharmD, Clinical Pharmacist 11/13/2017 6:07 AM

## 2017-11-13 NOTE — Plan of Care (Signed)
  Problem: Nutrition: Goal: Adequate nutrition will be maintained Outcome: Progressing   

## 2017-11-14 LAB — HEPARIN LEVEL (UNFRACTIONATED): Heparin Unfractionated: 0.53 IU/mL (ref 0.30–0.70)

## 2017-11-14 MED ORDER — CEFDINIR 300 MG PO CAPS: 300.0000 mg | ORAL_CAPSULE | Freq: Two times a day (BID) | ORAL | 0 refills | Status: DC

## 2017-11-14 MED ORDER — CEFDINIR 300 MG PO CAPS
300.0000 mg | ORAL_CAPSULE | Freq: Two times a day (BID) | ORAL | Status: DC
  Filled 2017-11-14: qty 1

## 2017-11-14 MED ORDER — AZITHROMYCIN 250 MG PO TABS: ORAL_TABLET | ORAL | 0 refills | Status: DC

## 2017-11-14 MED ORDER — APIXABAN 5 MG PO TABS: ORAL_TABLET | ORAL | 0 refills | Status: DC

## 2017-11-14 MED ORDER — APIXABAN 5 MG PO TABS
10.0000 mg | ORAL_TABLET | Freq: Two times a day (BID) | ORAL | Status: DC
  Administered 2017-11-14: 10 mg via ORAL
  Filled 2017-11-14: qty 2

## 2017-11-14 MED ORDER — PREDNISONE 10 MG PO TABS: ORAL_TABLET | ORAL | 0 refills | Status: DC

## 2017-11-14 MED ORDER — ACETAMINOPHEN 325 MG PO TABS: 650.0000 mg | ORAL_TABLET | Freq: Four times a day (QID) | ORAL | Status: DC | PRN

## 2017-11-14 NOTE — Progress Notes (Signed)
Patient requesting bed alarm to be turned off after stopping heparin gtt per MD orders. Verbal MD orders ok to turn off alarms and ambulate patient. Wife at bedside, bed alarm off, will continue to monitor patient.

## 2017-11-14 NOTE — Discharge Summary (Signed)
[facility] [place] NAME: Timothy Turner    MR#:  [id number]  DATE OF BIRTH:  1940-12-02  DATE OF ADMISSION:  11/11/2017 ADMITTING PHYSICIAN: Henreitta Leber, MD  DATE OF DISCHARGE: 11/14/2017  PRIMARY CARE PHYSICIAN: Leone Haven, MD    ADMISSION DIAGNOSIS:  Sepsis, due to unspecified organism ([place]) [A41.9] Pneumonia of left lower lobe due to infectious organism [facility]) [J18.1] Other acute pulmonary embolism without acute cor pulmonale ([place]) [I26.99]  DISCHARGE DIAGNOSIS:  Active Problems:   Pulmonary embolism ([place])   SECONDARY DIAGNOSIS:   Past Medical History:  Diagnosis Date  . Aspiration precautions    uses thicken in liquids.  Eats pureed food.  . Barrett esophagus   . Depression   . Gallstones   . GERD (gastroesophageal reflux disease)   . Heart murmur    History of  . Hemorrhoids   . Hypertension   . Hypothyroidism   . Iron deficiency anemia 02/10/2012  . Mitral valve disease   . Myalgia    Multiple  . Neuromuscular disorder ([place])   . PAD (peripheral artery disease) (HCC)    lower legs  . Pain syndrome, chronic    Severe  . Pneumonia    three times, last time >10 years ago  . Psoriasis   . Rheumatic heart valve incompetence   . Shortness of breath dyspnea   . Thyroiditis     HOSPITAL COURSE:   1.  Acute pulmonary embolism.  The patient was started on heparin drip during the hospital course.  The patient was having pleuritic chest pain throughout the hospital course until I started steroids which helped out with the pleuritic chest pain.  Patient was converted over to Eliquis.  Patient will be on 2 tablets of Eliquis 5 mg twice a day for 1 week and then 5 mg twice a day after that. 2.  DVT of the left lower extremity.  Seen by vascular surgeon no indication for IVC filter at this time.  Continue blood thinner. 3.  Pneumonia versus pulmonary infarct.  Antibiotics Rocephin and Zithromax were given for  pneumonia.  Blood thinner for pulmonary embolism and possible pulmonary infarct.  Pain control.  I added Solu-Medrol and pain has improved I will give a prednisone taper. 4.  Chronic dysphasia.  Spoke with dietary in the kitchen during the hospital course to give the patient what he wants and what he can eat for his meals 5.  Hypothyroidism unspecified on levothyroxine 6.  Hyperlipidemia unspecified on Zetia and Praluent at home 7.  BPH on finasteride 8.  Anxiety on clonazepam 9.  Constipation treated during hospital course  DISCHARGE CONDITIONS:   Satisfactory  CONSULTS OBTAINED:  Treatment Team:  Katha Cabal, MD  DRUG ALLERGIES:   Allergies  Allergen Reactions  . Duloxetine     Unable to urinate  . Nsaids Other (See Comments)    CANNOT TAKE NSAIDS BECAUSE OF ESOPHAGUS ISSUES CANNOT TAKE NSAIDS BECAUSE OF ESOPHAGUS ISSUES  . Rosuvastatin Other (See Comments)    SEVERE MUSCLE ACHES  . Statins Other (See Comments)    SEVERE MUSCLE ACHES  . Tolmetin Other (See Comments)    CANNOT TAKE NSAIDS BECAUSE OF ESOPHAGUS ISSUES    DISCHARGE MEDICATIONS:   Allergies as of 11/14/2017      Reactions   Duloxetine    Unable to urinate   Nsaids Other (See Comments)   CANNOT TAKE NSAIDS BECAUSE OF ESOPHAGUS ISSUES CANNOT TAKE NSAIDS  BECAUSE OF ESOPHAGUS ISSUES   Rosuvastatin Other (See Comments)   SEVERE MUSCLE ACHES   Statins Other (See Comments)   SEVERE MUSCLE ACHES   Tolmetin Other (See Comments)   CANNOT TAKE NSAIDS BECAUSE OF ESOPHAGUS ISSUES      Medication List    STOP taking these medications   cholestyramine 4 g packet Commonly known as:  QUESTRAN   escitalopram 10 MG tablet Commonly known as:  LEXAPRO   mupirocin ointment 2 % Commonly known as:  BACTROBAN     TAKE these medications   acetaminophen 325 MG tablet Commonly known as:  TYLENOL Take 2 tablets (650 mg total) by mouth every 6 (six) hours as needed for mild pain (or Fever >/= 101).    Alirocumab 75 MG/ML Sopn Commonly known as:  PRALUENT Inject 75 mg into the skin every 14 (fourteen) days.   apixaban 5 MG Tabs tablet Commonly known as:  ELIQUIS 2 tabs po twice a day for one week then one tablet twice a day afterwards   AXIRON 30 MG/ACT Soln Generic drug:  Testosterone Apply 1 pump under each armpit every night   azelastine 0.1 % nasal spray Commonly known as:  ASTELIN Place 2 sprays into both nostrils 2 (two) times daily. Use in each nostril as directed   azithromycin 250 MG tablet Commonly known as:  ZITHROMAX One tab daily for 1 more day Start taking on:  11/15/2017   cefdinir 300 MG capsule Commonly known as:  OMNICEF Take 1 capsule (300 mg total) by mouth every 12 (twelve) hours.   CENTRUM SILVER PO Take 1 tablet by mouth every evening.   clobetasol cream 0.05 % Commonly known as:  TEMOVATE Apply topically as needed. Reported on 12/03/2015   clonazePAM 2 MG tablet Commonly known as:  KLONOPIN Take 1 tablet (2 mg total) by mouth 3 (three) times daily as needed (severe muscle spasm).   colestipol 1 g tablet Commonly known as:  COLESTID Take 3 tablets (3 g total) by mouth 2 (two) times daily.   esomeprazole 40 MG capsule Commonly known as:  NEXIUM Take 1 capsule (40 mg total) by mouth daily.   ezetimibe 10 MG tablet Commonly known as:  ZETIA Take 1 tablet (10 mg total) by mouth daily.   finasteride 5 MG tablet Commonly known as:  PROSCAR Take 5 mg by mouth daily.   fluticasone 50 MCG/ACT nasal spray Commonly known as:  FLONASE Place into both nostrils daily as needed for allergies or rhinitis.   GARLIQUE PO Take by mouth daily.   levothyroxine 75 MCG tablet Commonly known as:  SYNTHROID, LEVOTHROID TAKE 1 TABLET DAILY   predniSONE 10 MG tablet Commonly known as:  DELTASONE 3 tabs po day1; 2 tabs po day2; 1 tab po day3; 1/2 tab po day4,5        DISCHARGE INSTRUCTIONS:   Follow-up PMD 6 days  If you experience worsening of  your admission symptoms, develop shortness of breath, life threatening emergency, suicidal or homicidal thoughts you must seek medical attention immediately by calling 911 or calling your MD immediately  if symptoms less severe.  You Must read complete instructions/literature along with all the possible adverse reactions/side effects for all the Medicines you take and that have been prescribed to you. Take any new Medicines after you have completely understood and accept all the possible adverse reactions/side effects.   Please note  You were cared for by a hospitalist during your hospital stay. If you have any  questions about your discharge medications or the care you received while you were in the hospital after you are discharged, you can call the unit and asked to speak with the hospitalist on call if the hospitalist that took care of you is not available. Once you are discharged, your primary care physician will handle any further medical issues. Please note that NO REFILLS for any discharge medications will be authorized once you are discharged, as it is imperative that you return to your primary care physician (or establish a relationship with a primary care physician if you do not have one) for your aftercare needs so that they can reassess your need for medications and monitor your lab values.    Today   CHIEF COMPLAINT:   Chief Complaint  Patient presents with  . Abdominal Pain    HISTORY OF PRESENT ILLNESS:  Timothy Turner  is a 77 y.o. male presented with chest pain and shortness of breath.  Found to have a pulmonary embolism   VITAL SIGNS:  Blood pressure (!) 148/74, pulse 65, temperature 98 F (36.7 C), temperature source Oral, resp. rate 16, height 6' (1.829 m), weight 81.1 kg (178 lb 14.4 oz), SpO2 94 %.    PHYSICAL EXAMINATION:  GENERAL:  78 y.o.-year-old patient lying in the bed with no acute distress.  EYES: Pupils equal, round, reactive to light and  accommodation. No scleral icterus. Extraocular muscles intact.  HEENT: Head atraumatic, normocephalic. Oropharynx and nasopharynx clear.  NECK:  Supple, no jugular venous distention. No thyroid enlargement, no tenderness.  LUNGS: Normal breath sounds bilaterally, no wheezing, rales,rhonchi or crepitation. No use of accessory muscles of respiration.  CARDIOVASCULAR: S1, S2 normal. No murmurs, rubs, or gallops.  ABDOMEN: Soft, non-tender, non-distended. Bowel sounds present. No organomegaly or mass.  EXTREMITIES: No pedal edema, cyanosis, or clubbing.  NEUROLOGIC: Cranial nerves II through XII are intact. Muscle strength 5/5 in all extremities. Sensation intact. Gait not checked.  PSYCHIATRIC: The patient is alert and oriented x 3.  SKIN: No obvious rash, lesion, or ulcer.   DATA REVIEW:   CBC Recent Labs  Lab 11/12/17 0358  WBC 12.9*  HGB 13.9  HCT 41.1  PLT 199    Chemistries  Recent Labs  Lab 11/11/17 1804 11/12/17 0358  NA 139 139  K 4.4 3.8  CL 101 102  CO2 28 28  GLUCOSE 123* 105*  BUN 21* 19  CREATININE 1.42* 1.54*  CALCIUM 9.5 9.0  AST 27  --   ALT 19  --   ALKPHOS 69  --   BILITOT 0.5  --      Microbiology Results  Results for orders placed or performed during the hospital encounter of 11/11/17  Urine culture     Status: None   Collection Time: 11/11/17  6:04 PM  Result Value Ref Range Status   Specimen Description   Final    URINE, RANDOM Performed at [facility], [address], [place], [place] [address]    Special Requests   Final    NONE Performed at [facility], [address], [place], [place] [address]    Culture   Final    NO GROWTH Performed at [facility], [place] [address] [place], [place] [address]    Report Status 11/13/2017 FINAL  Final  Blood culture (routine single)     Status: None (Preliminary result)   Collection Time: 11/11/17  6:04 PM  Result Value Ref Range Status   Specimen Description  BLOOD  BLOOD RIGHT FOREARM  Final   Special Requests   Final    Blood Culture results may not be optimal due to an excessive volume of blood received in culture bottles   Culture   Final    NO GROWTH 3 DAYS Performed at [facility], [address] [place], [place] [address]    Report Status PENDING  Incomplete  Blood culture (single)     Status: None (Preliminary result)   Collection Time: 11/11/17  7:22 PM  Result Value Ref Range Status   Specimen Description BLOOD BLOOD LEFT FOREARM  Final   Special Requests   Final    BOTTLES DRAWN AEROBIC AND ANAEROBIC Blood Culture results may not be optimal due to an excessive volume of blood received in culture bottles   Culture   Final    NO GROWTH 3 DAYS Performed at [facility], [address], [place], [place] [address]    Report Status PENDING  Incomplete     Management plans discussed with the patient, family and they are in agreement.  CODE STATUS:     Code Status Orders  (From admission, onward)        Start     Ordered   11/11/17 2046  Full code  Continuous     11/11/17 2045    Code Status History    Date Active Date Inactive Code Status Order ID Comments User Context   02/22/2015 1151 02/22/2015 1740 Full Code [id number]  Minna Merritts, MD Inpatient    Advance Directive Documentation     Most Recent Value  Type of Advance Directive  Living will, Healthcare Power of Attorney, Out of facility DNR (pink MOST or yellow form)  Pre-existing out of facility DNR order (yellow form or pink MOST form)  -  "MOST" Form in Place?  -      TOTAL TIME TAKING CARE OF THIS PATIENT: 35 minutes.    Loletha Grayer M.D on 11/14/2017 at 1:20 PM  Between 7am to 6pm - Pager - [phone]  After 6pm go to [email or web address] - password [email or web address]  [employer] Office  [phone]  CC: Primary care physician; Leone Haven, MD

## 2017-11-14 NOTE — Plan of Care (Signed)

## 2017-11-14 NOTE — Progress Notes (Signed)
[place] for heparin Indication: pulmonary embolus  Allergies  Allergen Reactions  . Duloxetine     Unable to urinate  . Nsaids Other (See Comments)    CANNOT TAKE NSAIDS BECAUSE OF ESOPHAGUS ISSUES CANNOT TAKE NSAIDS BECAUSE OF ESOPHAGUS ISSUES  . Rosuvastatin Other (See Comments)    SEVERE MUSCLE ACHES  . Statins Other (See Comments)    SEVERE MUSCLE ACHES  . Tolmetin Other (See Comments)    CANNOT TAKE NSAIDS BECAUSE OF ESOPHAGUS ISSUES    Patient Measurements: Height: 6' (182.9 cm) Weight: 178 lb 14.4 oz (81.1 kg) IBW/kg (Calculated) : 77.6  Vital Signs: Temp: 97.7 F (36.5 C) (05/26 0542) Temp Source: Oral (05/26 0542) BP: 132/71 (05/26 0542) Pulse Rate: 71 (05/26 0542)  Labs: Recent Labs    11/11/17 1120 11/11/17 1804 11/11/17 2116 11/12/17 0358  11/13/17 0459 11/13/17 1357 11/14/17 0618  HGB 14.9 14.8  --  13.9  --   --   --   --   HCT 44.8 44.9  --  41.1  --   --   --   --   PLT 239.0 223  --  199  --   --   --   --   LABPROT  --   --  14.0  --   --   --   --   --   INR  --   --  1.09  --   --   --   --   --   HEPARINUNFRC  --   --   --  0.77*   < > 0.31 0.32 0.53  CREATININE 1.54* 1.42*  --  1.54*  --   --   --   --   CKTOTAL 96  --   --   --   --   --   --   --    < > = values in this interval not displayed.    Estimated Creatinine Clearance: 46.2 mL/min (A) (by C-G formula based on SCr of 1.54 mg/dL (H)).   Medical History: Past Medical History:  Diagnosis Date  . Aspiration precautions    uses thicken in liquids.  Eats pureed food.  . Barrett esophagus   . Depression   . Gallstones   . GERD (gastroesophageal reflux disease)   . Heart murmur    History of  . Hemorrhoids   . Hypertension   . Hypothyroidism   . Iron deficiency anemia 02/10/2012  . Mitral valve disease   . Myalgia    Multiple  . Neuromuscular disorder ([place])   . PAD (peripheral artery disease) (HCC)    lower legs  . Pain syndrome,  chronic    Severe  . Pneumonia    three times, last time >10 years ago  . Psoriasis   . Rheumatic heart valve incompetence   . Shortness of breath dyspnea   . Thyroiditis     Medications:   On no anticoagulants PTA  Assessment: 76 YOM with sepsis and PE Goal of Therapy:  Heparin level 0.3-0.7 units/ml  Monitor platelets by anticoagulation protocol: Yes   Plan:  05/24 @ 0400 HL 0.77 supratherapeutic. Will decrease rate to 1300 units/hr and will recheck HL @ 1300.  05/24 @ 1408 HL 0.36. Level is therapeutic. Will continue current infusion rate of 1300 units/hr Recheck HL in 6 hours.   05/24 @ 2021 HL 0.41.   Will continue pt on current rate and recheck HL on  5/25 with AM labs.   05/25 @ 0500 HL 0.31 therapeutic. Considering patient has a PE and HL has been trending down, Will incease rate slightly to 1350 units/hr and will recheck @ 1400.   5/25@1900  hl 0.32, recheck with am labs. Therapeutic, continue heparin 1350units/hr.   05/26 @ 0600 HL 0.53 therapeutic. Will continue current rate and recheck w/ am labs.  Tobie Lords, PharmD, Clinical Pharmacist 11/14/2017 6:55 AM

## 2017-11-14 NOTE — Care Management (Signed)
Eliquis coupon given 

## 2017-11-14 NOTE — Progress Notes (Signed)
Patient given discharge instructions with prescriptions.Prescriptions also faxed to pharmacy bc wife had concerns about having to wait too long for them since they do not live close by. Per MD verbal orders, ok to fax them over to pharmacy. Patient and wife appreciated that very much. Patient and wife verbalized understanding with no further questions. Both IV's taken out and tele monitor off. Patient going home via family vehicle. Patient wheeled down in wheelchair in stable condition.

## 2017-11-16 LAB — CULTURE, BLOOD (SINGLE)
Culture: NO GROWTH
Culture: NO GROWTH

## 2017-11-18 ENCOUNTER — Ambulatory Visit (INDEPENDENT_AMBULATORY_CARE_PROVIDER_SITE_OTHER): Payer: Medicare Other | Admitting: Family Medicine

## 2017-11-18 ENCOUNTER — Encounter: Payer: Self-pay | Admitting: Family Medicine

## 2017-11-18 VITALS — BP 130/84 | HR 73 | Temp 98.2°F | Wt 176.0 lb

## 2017-11-18 DIAGNOSIS — J189 Pneumonia, unspecified organism: Secondary | ICD-10-CM

## 2017-11-18 DIAGNOSIS — I2699 Other pulmonary embolism without acute cor pulmonale: Secondary | ICD-10-CM | POA: Diagnosis not present

## 2017-11-18 MED ORDER — APIXABAN 5 MG PO TABS: 5.0000 mg | ORAL_TABLET | Freq: Two times a day (BID) | ORAL | 1 refills | Status: DC

## 2017-11-18 NOTE — Assessment & Plan Note (Signed)
I suspect given his reactive lymphadenopathy that the finding in his left lung was most likely related to a pneumonia.  He will complete his antibiotics.  Consider follow-up imaging with CT after he is completed his Eliquis therapy.

## 2017-11-18 NOTE — Assessment & Plan Note (Addendum)
This is a new issue.  Patient recently hospitalized and treated for a provoked DVT and PE.  He has done well since discharge from the hospital.  He is currently asymptomatic.  I have discussed appropriate Eliquis dosing with him and he will complete his 7 days of Eliquis 10 mg by mouth twice daily and then transition to Eliquis 5 mg by mouth twice daily.  We will attempt to get his Eliquis approved by his insurance.  He has been advised that if his insurance is not able to get this to him quickly he will need to have a short-term supply come from a local pharmacy.  He notes he will have to get this approved by his insurance before he can get this from his pharmacy.  I have discussed the potential adverse issues if he were to come off of the Eliquis.  Follow-up CBC and BMP in 1 week.  Follow-up in 3 months.

## 2017-11-18 NOTE — Patient Instructions (Signed)
Nice to see you. Your instructions for your Eliquis should be to take 10 mg (2 tablets) by mouth twice daily for 7 days, then take 5 mg (1 tablet) by mouth twice daily.  We will get the Eliquis submitted to your pharmacy so that you have a supply coming to your house. Please complete your antibiotics. If you develop recurrent chest pain or you develop shortness of breath, cough productive of blood, fevers, or any new or changing symptoms please seek medical attention immediately.

## 2017-11-18 NOTE — Progress Notes (Signed)
[name], MD Phone: [phone]  Timothy Turner is a 77 y.o. male who presents today for hospital follow-up.  CC: Pulmonary embolus.  Patient was seen by a nurse practitioner in our office previously for left rib pain.  He ended up in the emergency room after developing fevers.  He was found to have a PE and left lower extremity DVT as well as possible pneumonia versus pulmonary infarction related to the PE.  It appears they felt it was more likely pneumonia related given reactive lymphadenopathy.  He was placed on Eliquis after being on a heparin drip.  He was also placed on Omnicef which he is still taking.  He had 102 degree temperature prior to admission to the hospital.  He notes he feels much better at this point.  No chest pain or shortness of breath.  He has not had recurrent fevers.  He has minimal cough.  He feels weak overall though overall feels improved.  He had no leg swelling.  He reports the hospitalist recommended repeat CT scan at about 6 months to follow-up on his blood clot.  The patient reported he was supposed to be taking Eliquis 10 mg twice daily for 2 weeks.  This is not reflected in the discharge summary in the patient's chart and the prescription was for Eliquis 10 mg twice daily for 1 week followed by 5 mg twice daily.  Patient also voiced his displeasure with not being able to see me in the office for this issue when it occurred.  He states he was advised by whoever he spoke to on the phone that they could not even check with me to see if he would be seen by me.  Social History   Tobacco Use  Smoking Status Former Smoker  . Packs/day: 1.00  . Years: 30.00  . Pack years: 30.00  . Types: Cigarettes  . Last attempt to quit: 06/22/2004  . Years since quitting: 13.4  Smokeless Tobacco Never Used     ROS see history of present illness  Objective  Physical Exam Vitals:   11/18/17 1128  BP: 130/84  Pulse: 73  Temp: 98.2 F (36.8 C)  SpO2: 95%     BP Readings from Last 3 Encounters:  11/18/17 130/84  11/14/17 (!) 148/74  11/11/17 130/72   Wt Readings from Last 3 Encounters:  11/18/17 176 lb (79.8 kg)  11/11/17 178 lb 14.4 oz (81.1 kg)  11/11/17 178 lb 8 oz (81 kg)    Physical Exam  Constitutional: No distress.  Cardiovascular: Normal rate, regular rhythm and normal heart sounds.  Pulmonary/Chest: Effort normal and breath sounds normal.  No left lower rib tenderness  Musculoskeletal: He exhibits no edema.  No palpable cords in either calf  Neurological: He is alert.  Skin: Skin is warm and dry. He is not diaphoretic.     Assessment/Plan: Please see individual problem list.  Pulmonary embolism ([place]) This is a new issue.  Patient recently hospitalized and treated for a provoked DVT and PE.  He has done well since discharge from the hospital.  He is currently asymptomatic.  I have discussed appropriate Eliquis dosing with him and he will complete his 7 days of Eliquis 10 mg by mouth twice daily and then transition to Eliquis 5 mg by mouth twice daily.  We will attempt to get his Eliquis approved by his insurance.  He has been advised that if his insurance is not able to get this to him quickly he will  need to have a short-term supply come from a local pharmacy.  He notes he will have to get this approved by his insurance before he can get this from his pharmacy.  I have discussed the potential adverse issues if he were to come off of the Eliquis.  Follow-up CBC and BMP in 1 week.  Follow-up in 3 months.  Pneumonia I suspect given his reactive lymphadenopathy that the finding in his left lung was most likely related to a pneumonia.  He will complete his antibiotics.  Consider follow-up imaging with CT after he is completed his Eliquis therapy.   Orders Placed This Encounter  Procedures  . CBC    Standing Status:   Future    Standing Expiration Date:   11/19/2018  . Basic Metabolic Panel (BMET)    Standing Status:   Future     Standing Expiration Date:   11/19/2018    Meds ordered this encounter  Medications  . apixaban (ELIQUIS) 5 MG TABS tablet    Sig: Take 1 tablet (5 mg total) by mouth 2 (two) times daily.    Dispense:  180 tablet    Refill:  [place], MD [place]

## 2017-11-19 ENCOUNTER — Other Ambulatory Visit: Payer: Self-pay | Admitting: Family Medicine

## 2017-11-21 ENCOUNTER — Other Ambulatory Visit: Payer: Self-pay | Admitting: Family Medicine

## 2017-11-26 ENCOUNTER — Other Ambulatory Visit (INDEPENDENT_AMBULATORY_CARE_PROVIDER_SITE_OTHER): Payer: Medicare Other

## 2017-11-26 ENCOUNTER — Telehealth: Payer: Self-pay

## 2017-11-26 ENCOUNTER — Other Ambulatory Visit: Payer: Self-pay | Admitting: Family Medicine

## 2017-11-26 DIAGNOSIS — R7309 Other abnormal glucose: Secondary | ICD-10-CM

## 2017-11-26 DIAGNOSIS — I2699 Other pulmonary embolism without acute cor pulmonale: Secondary | ICD-10-CM

## 2017-11-26 DIAGNOSIS — N138 Other obstructive and reflux uropathy: Secondary | ICD-10-CM

## 2017-11-26 DIAGNOSIS — D075 Carcinoma in situ of prostate: Secondary | ICD-10-CM

## 2017-11-26 DIAGNOSIS — N529 Male erectile dysfunction, unspecified: Secondary | ICD-10-CM

## 2017-11-26 DIAGNOSIS — N403 Nodular prostate with lower urinary tract symptoms: Secondary | ICD-10-CM

## 2017-11-26 LAB — BASIC METABOLIC PANEL
BUN: 21 mg/dL (ref 6–23)
CHLORIDE: 104 meq/L (ref 96–112)
CO2: 28 meq/L (ref 19–32)
Calcium: 9.3 mg/dL (ref 8.4–10.5)
Creatinine, Ser: 1.52 mg/dL — ABNORMAL HIGH (ref 0.40–1.50)
GFR: 47.81 mL/min — AB (ref >60.00)
GLUCOSE: 133 mg/dL — AB (ref 70–99)
POTASSIUM: 4.3 meq/L (ref 3.5–5.1)
Sodium: 141 mEq/L (ref 135–145)

## 2017-11-26 LAB — CBC
HEMATOCRIT: 44.4 % (ref 39.0–52.0)
Hemoglobin: 14.7 g/dL (ref 13.0–17.0)
MCHC: 33.2 g/dL (ref 30.0–36.0)
MCV: 87.9 fl (ref 78.0–100.0)
PLATELETS: 265 10*3/uL (ref 150.0–400.0)
RBC: 5.05 Mil/uL (ref 4.22–5.81)
RDW: 13.6 % (ref 11.5–15.5)
WBC: 10.1 10*3/uL (ref 4.0–10.5)

## 2017-11-26 LAB — HEMOGLOBIN A1C: Hgb A1c MFr Bld: 5.7 % (ref 4.6–6.5)

## 2017-11-26 NOTE — Telephone Encounter (Signed)
Copied from [place] #[id number]. Topic: Inquiry >> Nov 26, 2017  3:28 PM Pricilla Handler wrote: Reason for CRM: Patient's wife called regarding the lab results that a nurse from the office called about a little while ago. She wants a return call regarding her husband's kidney function. She also wants Dr. Caryl Bis to refer her husband to a new kidney specialist, as the patient's current kidney specialist is leaving the state on June 14th.       Thank You!!!

## 2017-11-26 NOTE — Telephone Encounter (Signed)
Referral placed.

## 2017-11-26 NOTE — Telephone Encounter (Signed)
Patients wife states Dr.Cope is leaving the state so he will need to be referred to another kidney specialist. They would like to stay in [place] if possible.Patient would like a morning appointment.

## 2017-11-26 NOTE — Addendum Note (Signed)
Addended by: Leone Haven on: 11/26/2017 05:52 PM   Modules accepted: Orders

## 2017-11-29 ENCOUNTER — Other Ambulatory Visit: Payer: Medicare Other

## 2017-11-29 ENCOUNTER — Ambulatory Visit: Payer: Medicare Other | Admitting: Family Medicine

## 2017-12-06 ENCOUNTER — Telehealth: Payer: Self-pay

## 2017-12-06 NOTE — Telephone Encounter (Signed)
patient states he is having muscle and joint pain. He states it started right after he started the praluent 10/29/17

## 2017-12-06 NOTE — Telephone Encounter (Signed)
Please advise 

## 2017-12-06 NOTE — Telephone Encounter (Signed)
Copied from [place] [id number]. Topic: General - Other >> Dec 06, 2017  8:19 AM Carolyn Stare wrote:  Pt cal lto say the belwo med is causing him to have muscle and joint pain. Would like a call back   Alirocumab (PRALUENT) 75 MG/ML SOPN

## 2017-12-06 NOTE — Telephone Encounter (Signed)
Please find out what symptoms he is having in particular? Is it just muscle aches and joint pain? Or is he having other symptoms? When did they start? Have they worsened or improved?

## 2017-12-07 ENCOUNTER — Other Ambulatory Visit: Payer: Self-pay

## 2017-12-07 DIAGNOSIS — M791 Myalgia, unspecified site: Secondary | ICD-10-CM

## 2017-12-07 NOTE — Telephone Encounter (Signed)
If he is having muscle aches and joint pain related to that medication he can discontinue it.  At this point there is not any other medication to try for his cholesterol as he has had side effects to all of them.

## 2017-12-07 NOTE — Telephone Encounter (Signed)
Left message to return call, ok for pec to speak to patient about message below 

## 2017-12-07 NOTE — Telephone Encounter (Signed)
Last Ov 11/18/17 last filled 09/07/17 90 1rf

## 2017-12-08 MED ORDER — CLONAZEPAM 2 MG PO TABS
2.0000 mg | ORAL_TABLET | Freq: Three times a day (TID) | ORAL | 1 refills | Status: DC | PRN
Start: 2017-12-08 — End: 2017-12-14

## 2017-12-08 NOTE — Telephone Encounter (Signed)
Sent to pharmacy.  Drug database reviewed. 

## 2017-12-09 NOTE — Telephone Encounter (Signed)
Left message to return call, ok for pec to speak to patient about message below 

## 2017-12-14 ENCOUNTER — Telehealth: Payer: Self-pay | Admitting: Family Medicine

## 2017-12-14 ENCOUNTER — Other Ambulatory Visit: Payer: Self-pay | Admitting: Family Medicine

## 2017-12-14 DIAGNOSIS — M791 Myalgia, unspecified site: Secondary | ICD-10-CM

## 2017-12-14 MED ORDER — CLONAZEPAM 2 MG PO TABS
2.0000 mg | ORAL_TABLET | Freq: Three times a day (TID) | ORAL | 1 refills | Status: DC | PRN
Start: 2017-12-14 — End: 2018-03-17

## 2017-12-14 NOTE — Telephone Encounter (Signed)
See other message

## 2017-12-14 NOTE — Telephone Encounter (Signed)
Sent to pharmacy 

## 2017-12-14 NOTE — Telephone Encounter (Signed)
Copied from [place] [id number]. Topic: Quick Communication - Office Called Patient >> Dec 14, 2017 11:44 AM [name], NT wrote: Reason for [id number] returned call from Timothy Turner he says he will have his phone on him for the nest 2 hrs then not for an please call him back ,thanks

## 2017-12-14 NOTE — Telephone Encounter (Signed)
Copied from [place] [id number]. Topic: Quick Communication - Rx Refill/Question >> Dec 14, 2017  8:48 AM Burchel, Abbi R wrote: Medication: clonazePAM (KLONOPIN) 2 MG tablet  Has the patient contacted their pharmacy? Yes.    Preferred Pharmacy: [facility], [place]  [phone] (Phone) [phone] (Fax)  Pt states that this Rx was sent to the wrong Pharmacy. Pt originally requested this rx refill on 6/19.  Please re-send to [facility], not [facility].  Pt also states that he only has a 5 day supply left.

## 2017-12-14 NOTE — Telephone Encounter (Signed)
Patient notified

## 2018-01-06 ENCOUNTER — Other Ambulatory Visit: Payer: Medicare Other

## 2018-01-17 ENCOUNTER — Ambulatory Visit (INDEPENDENT_AMBULATORY_CARE_PROVIDER_SITE_OTHER): Payer: Medicare Other | Admitting: Urology

## 2018-01-17 ENCOUNTER — Encounter: Payer: Self-pay | Admitting: Urology

## 2018-01-17 ENCOUNTER — Encounter

## 2018-01-17 VITALS — BP 145/78 | HR 57 | Ht 72.0 in | Wt 180.4 lb

## 2018-01-17 DIAGNOSIS — E291 Testicular hypofunction: Secondary | ICD-10-CM

## 2018-01-17 DIAGNOSIS — N4231 Prostatic intraepithelial neoplasia: Secondary | ICD-10-CM | POA: Insufficient documentation

## 2018-01-17 DIAGNOSIS — N529 Male erectile dysfunction, unspecified: Secondary | ICD-10-CM

## 2018-01-17 DIAGNOSIS — D075 Carcinoma in situ of prostate: Secondary | ICD-10-CM

## 2018-01-17 LAB — URINALYSIS, COMPLETE
BILIRUBIN UA: NEGATIVE
GLUCOSE, UA: NEGATIVE
KETONES UA: NEGATIVE
Leukocytes, UA: NEGATIVE
Nitrite, UA: NEGATIVE
Protein, UA: NEGATIVE
RBC, UA: NEGATIVE
Specific Gravity, UA: <1.005 — ABNORMAL LOW (ref 1.005–1.030)
UUROB: 0.2 mg/dL (ref 0.2–1.0)
pH, UA: 5.5 (ref 5.0–7.5)

## 2018-01-17 LAB — BLADDER SCAN AMB NON-IMAGING

## 2018-01-17 MED ORDER — FINASTERIDE 5 MG PO TABS: 5.0000 mg | ORAL_TABLET | Freq: Every day | ORAL | 3 refills | Status: DC

## 2018-01-17 NOTE — Progress Notes (Signed)
01/17/2018 9:09 AM   Timothy Turner February 19, 1941 [id number]  Referring provider: Leone Haven, MD [address] [place], [place] [address]  Chief Complaint  Patient presents with  . Establish Care   Urologic problems: 1.  Prostate biopsy by Dr. 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  Started on finasteride and PSA has remained stable.  Was not rebiopsied  2.  Hypogonadism; on Axiron   HPI: 77 year old male presents to ED establish a local urologic care.  He has previously been followed by Dr. Jacqlyn Larsen for the past several years and last saw him at [facility] in February 2019.  He remains on axillary testosterone and notes good energy level.  He remains on finasteride and denies bothersome lower urinary tract symptoms.  His last PSA February 2019 was stable at 1.03.  He is followed for chronic kidney disease.  A CT scan performed in May 2019 showed no renal masses, calculi or hydronephrosis.   PMH: Past Medical History:  Diagnosis Date  . Aspiration precautions    uses thicken in liquids.  Eats pureed food.  . Barrett esophagus   . Depression   . Gallstones   . GERD (gastroesophageal reflux disease)   . Heart murmur    History of  . Hemorrhoids   . Hypertension   . Hypothyroidism   . Iron deficiency anemia 02/10/2012  . Mitral valve disease   . Myalgia    Multiple  . Neuromuscular disorder ([place])   . PAD (peripheral artery disease) (HCC)    lower legs  . Pain syndrome, chronic    Severe  . Pneumonia    three times, last time >10 years ago  . Psoriasis   . Rheumatic heart valve incompetence   . Shortness of breath dyspnea   . Thyroiditis     Surgical History: Past Surgical History:  Procedure Laterality Date  . BACK SURGERY    . CARDIAC CATHETERIZATION N/A 02/22/2015   Procedure: Left Heart Cath and Coronary Angiography;  Surgeon: Minna Merritts, MD;  Location: [facility];   Service: Cardiovascular;  Laterality: N/A;  . CATARACT EXTRACTION W/PHACO Left 07/29/2016   Procedure: CATARACT EXTRACTION PHACO AND INTRAOCULAR LENS PLACEMENT ([facility])  Left eye IVA Topical;  Surgeon: Leandrew Koyanagi, MD;  [facility];  Service: Ophthalmology;  Laterality: Left;  . CATARACT EXTRACTION W/PHACO Right 08/19/2016   Procedure: CATARACT EXTRACTION PHACO AND INTRAOCULAR LENS PLACEMENT ([place]) right  TORIC;  Surgeon: Leandrew Koyanagi, MD;  Location: [facility];  Service: Ophthalmology;  Laterality: Right;  prefers early morning  . CHOLECYSTECTOMY  08/03/13  . COLONOSCOPY  03-08-12   Dr Donnella Sham  . COLONOSCOPY WITH PROPOFOL N/A 06/17/2017   Procedure: COLONOSCOPY WITH PROPOFOL;  Surgeon: [name], MD;  Location: [facility] ENDOSCOPY;  Service: Gastroenterology;  Laterality: N/A;  . DIRECT LARYNGOSCOPY N/A 02/17/2016   Procedure: DIRECT LARYNGOSCOPY;  Surgeon: Jodi Marble, MD;  Location: [facility];  Service: ENT;  Laterality: N/A;  . ESOPHAGEAL MANOMETRY N/A 02/27/2015   Procedure: ESOPHAGEAL MANOMETRY (EM);  Surgeon: Lollie Sails, MD;  Location[facility] ENDOSCOPY;  Service: Endoscopy;  Laterality: N/A;  . ESOPHAGOGASTRODUODENOSCOPY (EGD) WITH PROPOFOL N/A 02/18/2015   Procedure: ESOPHAGOGASTRODUODENOSCOPY (EGD) WITH PROPOFOL;  Surgeon: Lollie Sails, MD;  Location: [facility] ENDOSCOPY;  Service: Endoscopy;  Laterality: N/A;  . ESOPHAGOGASTRODUODENOSCOPY (EGD) WITH PROPOFOL N/A 06/17/2017   Procedure: ESOPHAGOGASTRODUODENOSCOPY (EGD) WITH PROPOFOL;  Surgeon: [name], MD;  Location[facility]COPY;  Service: Gastroenterology;  Laterality: N/A;  . ESOPHAGOSCOPY W/ BOTOX INJECTION N/A 02/17/2016   Procedure: ESOPHAGOSCOPY WITH BOTOX INJECTION  ;  Surgeon: Jodi Marble, MD;  Location: [facility];  Service: ENT;  Laterality: N/A;  . HEMORROIDECTOMY    . UPPER GI ENDOSCOPY  03-06-2013   Dr Donnella Sham    Home Medications:  Allergies as of  01/17/2018      Reactions   Duloxetine    Unable to urinate   Nsaids Other (See Comments)   CANNOT TAKE NSAIDS BECAUSE OF ESOPHAGUS ISSUES CANNOT TAKE NSAIDS BECAUSE OF ESOPHAGUS ISSUES   Rosuvastatin Other (See Comments)   SEVERE MUSCLE ACHES   Statins Other (See Comments)   SEVERE MUSCLE ACHES   Tolmetin Other (See Comments)   CANNOT TAKE NSAIDS BECAUSE OF ESOPHAGUS ISSUES      Medication List        Accurate as of 01/17/18  9:09 AM. Always use your most recent med list.          Alirocumab 75 MG/ML Sopn Commonly known as:  PRALUENT Inject 75 mg into the skin every 14 (fourteen) days.   apixaban 5 MG Tabs tablet Commonly known as:  ELIQUIS Take 1 tablet (5 mg total) by mouth 2 (two) times daily.   AXIRON 30 MG/ACT Soln Generic drug:  Testosterone Apply 1 pump under each armpit every night   azelastine 0.1 % nasal spray Commonly known as:  ASTELIN Place 2 sprays into both nostrils 2 (two) times daily. Use in each nostril as directed   celecoxib 200 MG capsule Commonly known as:  CELEBREX Take 200 mg by mouth 2 (two) times daily.   CENTRUM SILVER PO Take 1 tablet by mouth every evening.   cetirizine 10 MG tablet Commonly known as:  ZYRTEC Take 10 mg by mouth daily.   clobetasol cream 0.05 % Commonly known as:  TEMOVATE Apply topically as needed. Reported on 12/03/2015   clonazePAM 2 MG tablet Commonly known as:  KLONOPIN Take 1 tablet (2 mg total) by mouth 3 (three) times daily as needed (severe muscle spasm).   colestipol 1 g tablet Commonly known as:  COLESTID Take 3 tablets (3 g total) by mouth 2 (two) times daily.   esomeprazole 40 MG capsule Commonly known as:  NEXIUM TAKE 1 CAPSULE DAILY   ezetimibe 10 MG tablet Commonly known as:  ZETIA Take 1 tablet (10 mg total) by mouth daily.   finasteride 5 MG tablet Commonly known as:  PROSCAR Take 5 mg by mouth daily.   fluticasone 50 MCG/ACT nasal spray Commonly known as:  FLONASE Place into both  nostrils daily as needed for allergies or rhinitis.   GARLIC OIL PO Take by mouth.   levothyroxine 75 MCG tablet Commonly known as:  SYNTHROID, LEVOTHROID TAKE 1 TABLET DAILY   predniSONE 10 MG tablet Commonly known as:  DELTASONE 3 tabs po day1; 2 tabs po day2; 1 tab po day3; 1/2 tab po day4,5       Allergies:  Allergies  Allergen Reactions  . Duloxetine     Unable to urinate  . Nsaids Other (See Comments)    CANNOT TAKE NSAIDS BECAUSE OF ESOPHAGUS ISSUES CANNOT TAKE NSAIDS BECAUSE OF ESOPHAGUS ISSUES  . Rosuvastatin Other (See Comments)    SEVERE MUSCLE ACHES  . Statins Other (See Comments)    SEVERE MUSCLE ACHES  . Tolmetin Other (See Comments)    CANNOT TAKE  NSAIDS BECAUSE OF ESOPHAGUS ISSUES    Family History: Family History  Problem Relation Age of Onset  . Heart failure Mother   . Heart failure Father   . Emphysema Father        smoked  . Cancer Sister   . Aneurysm Sister        Brain    Social History:  reports that he quit smoking about 13 years ago. His smoking use included cigarettes. He has a 30.00 pack-year smoking history. He has never used smokeless tobacco. He reports that he does not drink alcohol or use drugs.  ROS: UROLOGY Frequent Urination?: Yes Hard to postpone urination?: No Burning/pain with urination?: No Get up at night to urinate?: Yes Leakage of urine?: No Urine stream starts and stops?: No Trouble starting stream?: No Do you have to strain to urinate?: No Blood in urine?: No Urinary tract infection?: No Sexually transmitted disease?: No Injury to kidneys or bladder?: No Painful intercourse?: No Weak stream?: No Erection problems?: No Penile pain?: No  Gastrointestinal Nausea?: No Vomiting?: No Indigestion/heartburn?: Yes Diarrhea?: No Constipation?: No  Constitutional Fever: No Night sweats?: No Weight loss?: Yes Fatigue?: Yes  Skin Skin rash/lesions?: No Itching?: No  Eyes Blurred vision?: No Double  vision?: No  Ears/Nose/Throat Sore throat?: No Sinus problems?: Yes  Hematologic/Lymphatic Swollen glands?: No Easy bruising?: Yes  Cardiovascular Leg swelling?: No Chest pain?: No  Respiratory Cough?: No Shortness of breath?: No  Endocrine Excessive thirst?: No  Musculoskeletal Back pain?: No Joint pain?: Yes  Neurological Headaches?: No Dizziness?: No  Psychologic Depression?: Yes Anxiety?: No  Physical Exam: BP (!) 145/78 (BP Location: Left Arm, Patient Position: Sitting, Cuff Size: Normal)   Pulse (!) 57   Ht 6' (1.829 m)   Wt 180 lb 6.4 oz (81.8 kg)   BMI 24.47 kg/m   Constitutional:  Alert and oriented, No acute distress. HEENT: [place] AT, moist mucus membranes.  Trachea midline, no masses. Cardiovascular: No clubbing, cyanosis, or edema. Respiratory: Normal respiratory effort, no increased work of breathing. GI: Abdomen is soft, nontender, nondistended, no abdominal masses GU: No CVA tenderness.  Prostate 35 g, smooth without nodules Lymph: No cervical or inguinal lymphadenopathy. Skin: No rashes, bruises or suspicious lesions. Neurologic: Grossly intact, no focal deficits, moving all 4 extremities. Psychiatric: Normal mood and affect.  Laboratory Data:  Urinalysis Dipstick/microscopy negative   Assessment & Plan:    1. High grade prostatic intraepithelial neoplasia DRE today is benign.  PSA was drawn and if stable he will follow-up in 6 months.  Finasteride was refilled.  PVR by bladder scan was 23 mL.   2. 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  Testosterone and hematocrit levels were ordered.   Abbie Sons, Rachel [address], [place], [place] [address] [phone]

## 2018-01-18 LAB — TESTOSTERONE: TESTOSTERONE: 379 ng/dL (ref 264–916)

## 2018-01-18 LAB — PSA: PROSTATE SPECIFIC AG, SERUM: 1.4 ng/mL (ref 0.0–4.0)

## 2018-02-23 ENCOUNTER — Ambulatory Visit (INDEPENDENT_AMBULATORY_CARE_PROVIDER_SITE_OTHER): Payer: Medicare Other | Admitting: Family Medicine

## 2018-02-23 ENCOUNTER — Encounter: Payer: Self-pay | Admitting: Family Medicine

## 2018-02-23 VITALS — BP 118/80 | HR 58 | Temp 97.9°F | Resp 17 | Ht 72.0 in | Wt 178.5 lb

## 2018-02-23 DIAGNOSIS — G5623 Lesion of ulnar nerve, bilateral upper limbs: Secondary | ICD-10-CM | POA: Diagnosis not present

## 2018-02-23 DIAGNOSIS — N183 Chronic kidney disease, stage 3 unspecified: Secondary | ICD-10-CM | POA: Insufficient documentation

## 2018-02-23 DIAGNOSIS — G5620 Lesion of ulnar nerve, unspecified upper limb: Secondary | ICD-10-CM | POA: Insufficient documentation

## 2018-02-23 DIAGNOSIS — R1312 Dysphagia, oropharyngeal phase: Secondary | ICD-10-CM

## 2018-02-23 DIAGNOSIS — R51 Headache: Secondary | ICD-10-CM

## 2018-02-23 DIAGNOSIS — G44209 Tension-type headache, unspecified, not intractable: Secondary | ICD-10-CM

## 2018-02-23 DIAGNOSIS — R519 Headache, unspecified: Secondary | ICD-10-CM | POA: Insufficient documentation

## 2018-02-23 DIAGNOSIS — Z23 Encounter for immunization: Secondary | ICD-10-CM

## 2018-02-23 DIAGNOSIS — E039 Hypothyroidism, unspecified: Secondary | ICD-10-CM

## 2018-02-23 DIAGNOSIS — I2699 Other pulmonary embolism without acute cor pulmonale: Secondary | ICD-10-CM | POA: Diagnosis not present

## 2018-02-23 DIAGNOSIS — M791 Myalgia, unspecified site: Secondary | ICD-10-CM

## 2018-02-23 MED ORDER — APIXABAN 5 MG PO TABS: 5.0000 mg | ORAL_TABLET | Freq: Two times a day (BID) | ORAL | 1 refills | Status: DC

## 2018-02-23 NOTE — Assessment & Plan Note (Signed)
Patient is seen nephrology.  Continue to monitor.

## 2018-02-23 NOTE — Assessment & Plan Note (Signed)
This has improved quite a bit.  He will continue speech therapy.

## 2018-02-23 NOTE — Assessment & Plan Note (Signed)
Patient will continue on Eliquis at this time.  We will refer to pulmonology for follow-up to help determine need for repeat CT scan and duration of treatment.

## 2018-02-23 NOTE — Assessment & Plan Note (Addendum)
New issue. No prior history of recurrent headaches. Patient is neurologically intact.  Headaches started after having some mandibular implants placed.  There are no signs of infection in his mouth.  Given the time course it would seem to be related and he will discuss with his dentist that his upcoming visit in the next week or so.  If his dentist thinks this issue is not contributing to his headaches we would need to consider further evaluation.  I will send a message to his GI physician to determine if Tylenol is contraindicated for a GI reason.

## 2018-02-23 NOTE — Patient Instructions (Addendum)
Nice to see you. We will get you in to see pulmonology for follow-up of your PE.  I have refilled your Eliquis. Please discuss your hand numbness with your neurologist. If this worsens please be evaluated again.

## 2018-02-23 NOTE — Assessment & Plan Note (Signed)
In the past he has had these with statins as well as injectable cholesterol medication.  I discussed with him that I would think that the symptoms would have resolved by now if they were related to medication.  He reports he has an appointment with rheumatology and he will keep this for evaluation.

## 2018-02-23 NOTE — Progress Notes (Addendum)
Timothy Rumps, MD Phone: [phone]  Timothy Turner is a 77 y.o. male who presents today for f/u.  CC: PE, CKD, hypothyroidism, myalgias, swallowing difficulty, headache, hand numbness  History of pulmonary embolism: He remains on Eliquis.  He has no chest pain, shortness breath, or leg swelling.  No bleeding issues.  On review of his notes from the hospital it appears that vascular surgery recommended that he follow with pulmonology.  He needs a referral.  CKD: He saw nephrology for his creatinine and states they told him not to worry about it.  We will continue to monitor.  Hypothyroidism: Taking Synthroid.  No skin changes or heat or cold intolerance.  Patient reports he still suffers from muscle aches from taking the injectable cholesterol medication.  He saw neurology for this and they have referred him to a rheumatologist to discuss further.  He notes his swallowing difficulty is improved significantly.  He has been doing speech therapy.  He reports this is 80% better.  He is eating soft foods.  Patient reports the ulnar aspect of his right hand greater than his left hand will go numb if he bends his elbows and reads a book or when he is driving.  Occasionally it occurs when he is asleep.  If he straightens his arm out this will improve.  He notes no numbness elsewhere.  At the end of the visit when I was bringing the patient his after visit summary he noted he had been having headaches since having his dental implants placed in his lower mouth.  I discussed with him that in the future if he could bring his concerns up at the start of the visit it would allow [place] to more safely and appropriately discuss his issues.  I discussed with him that the more issues we discuss the more of a safety issue there is as we try to deal with too large of a number of issues during one appointment.  He was unhappy with this and stated if my time was too valuable to discuss his issues we did not  need to discuss his headaches.  He noted he could not always remember everything that he wanted to talk about and he did make himself a list for this visit.  I advised that headaches were something that we should discuss and we proceeded to discuss his headaches.  He has had bilateral frontal headaches since having dental implants placed.  He has had no fevers, vision changes, and his other than what is outlined above, weakness, or issues.  He notes sometimes they last all day as he cannot take any NSAIDs.  He states he cannot take Tylenol either based on advice from his GI physician.  He notes that headaches are dull and they do go away on their own at times.  Social History   Tobacco Use  Smoking Status Former Smoker  . Packs/day: 1.00  . Years: 30.00  . Pack years: 30.00  . Types: Cigarettes  . Last attempt to quit: 06/22/2004  . Years since quitting: 13.6  Smokeless Tobacco Never Used     ROS see history of present illness  Objective  Physical Exam Vitals:   02/23/18 0758  BP: 118/80  Pulse: (!) 58  Resp: 17  Temp: 97.9 F (36.6 C)  SpO2: 96%    BP Readings from Last 3 Encounters:  02/23/18 118/80  01/17/18 (!) 145/78  11/18/17 130/84   Wt Readings from Last 3 Encounters:  02/23/18 178 lb  8 oz (81 kg)  01/17/18 180 lb 6.4 oz (81.8 kg)  11/18/17 176 lb (79.8 kg)    Physical Exam  Constitutional: No distress.  Cardiovascular: Normal rate, regular rhythm and normal heart sounds.  Pulmonary/Chest: Effort normal and breath sounds normal.  Musculoskeletal: He exhibits no edema.  Positive Tinel's at bilateral ulnar grooves  Neurological: He is alert.  CN 2-12 intact, 5/5 strength in bilateral biceps, triceps, grip, quads, hamstrings, plantar and dorsiflexion, sensation to light touch intact in bilateral UE and LE, normal gait  Skin: Skin is warm and dry. He is not diaphoretic.  Studs noted within his gums in is mandible, stitches noted as well, there is no swelling or  signs of infection   Assessment/Plan: Please see individual problem list.  Pulmonary embolism ([place]) Patient will continue on Eliquis at this time.  We will refer to pulmonology for follow-up to help determine need for repeat CT scan and duration of treatment.  Dysphagia, oropharyngeal phase This has improved quite a bit.  He will continue speech therapy.  Hypothyroidism Continue Synthroid.  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  Discussed orthopedic referral though he wondered if he could see his neurologist first as he sees them in the next several weeks.  I noted this would be reasonable.  They can determine if he needs nerve conduction studies.  Headache New issue. No prior history of recurrent headaches. Patient is neurologically intact.  Headaches started after having some mandibular implants placed.  There are no signs of infection in his mouth.  Given the time course it would seem to be related and he will discuss with his dentist that his upcoming visit in the next week or so.  If his dentist thinks this issue is not contributing to his headaches we would need to consider further evaluation.  I will send a message to his GI physician to determine if Tylenol is contraindicated for a GI reason.  Chronic kidney disease (CKD), stage III (moderate) (HCC) Patient is seen nephrology.  Continue to monitor.  Myalgia In the past he has had these with statins as well as injectable cholesterol medication.  I discussed with him that I would think that the symptoms would have resolved by now if they were related to medication.  He reports he has an appointment with rheumatology and he will keep this for evaluation.   Orders Placed This Encounter  Procedures  . Varicella-zoster vaccine IM (Shingrix)  . Flu vaccine HIGH DOSE PF (Fluzone High dose)  . Ambulatory referral to Pulmonology    Referral Priority:   Routine    Referral Type:    Consultation    Referral Reason:   Specialty Services Required    Requested Specialty:   Pulmonary Disease    Number of Visits Requested:   1    Meds ordered this encounter  Medications  . apixaban (ELIQUIS) 5 MG TABS tablet    Sig: Take 1 tablet (5 mg total) by mouth 2 (two) times daily.    Dispense:  180 tablet    Refill:  [place], MD [place]

## 2018-02-23 NOTE — Assessment & Plan Note (Signed)
Continue Synthroid °

## 2018-02-23 NOTE — Assessment & Plan Note (Signed)
I suspect possible compression at the ulnar groove at bilateral elbows.  Discussed orthopedic referral though he wondered if he could see his neurologist first as he sees them in the next several weeks.  I noted this would be reasonable.  They can determine if he needs nerve conduction studies.

## 2018-02-24 ENCOUNTER — Telehealth: Payer: Self-pay | Admitting: Family Medicine

## 2018-02-24 ENCOUNTER — Encounter: Payer: Self-pay | Admitting: *Deleted

## 2018-02-24 NOTE — Telephone Encounter (Signed)
Sent mychart message

## 2018-02-24 NOTE — Telephone Encounter (Signed)
-----   Message from [name], MD sent at 02/23/2018  1:42 PM EDT ----- Good morning   You are correct , no reason why he cant take tylenol. Would as suggested by yourselves , avoid NSAID's  Regards  Kiran  ----- Message ----- From: Leone Haven, MD Sent: 02/23/2018   1:24 PM EDT To: [name], MD  Hi Dr Vicente Males,   I saw Mr Timothy Turner for follow-up today. He has been having headaches and I wanted to advise him to use tylenol for these, though he reported that he could not take tylenol due to his esophageal issues. He noted he could not take NSAIDs, which I understood, though I wanted to check with you to see if there was a reason from your perspective that I he could not take tylenol. Thanks for your help.  [place]

## 2018-02-24 NOTE — Telephone Encounter (Signed)
Please contact the patient and let him know I heard back from his GI physician.  Dr. Vicente Males noted there was no reason he could not take Tylenol.  The patient could trial Tylenol over-the-counter for his headaches.  He should discuss his headaches with his dentist as well as we discussed during his office visit.  Thanks.

## 2018-03-04 ENCOUNTER — Ambulatory Visit (INDEPENDENT_AMBULATORY_CARE_PROVIDER_SITE_OTHER)
Admission: RE | Admit: 2018-03-04 | Discharge: 2018-03-04 | Disposition: A | Discharge status: Home / Self Care | Payer: Medicare Other | Source: Ambulatory Visit | Attending: Internal Medicine | Admitting: Internal Medicine

## 2018-03-04 ENCOUNTER — Other Ambulatory Visit (INDEPENDENT_AMBULATORY_CARE_PROVIDER_SITE_OTHER): Payer: Medicare Other

## 2018-03-04 ENCOUNTER — Encounter: Payer: Self-pay | Admitting: Internal Medicine

## 2018-03-04 ENCOUNTER — Ambulatory Visit (INDEPENDENT_AMBULATORY_CARE_PROVIDER_SITE_OTHER): Payer: Medicare Other | Admitting: Internal Medicine

## 2018-03-04 VITALS — BP 126/70 | HR 64 | Ht 71.25 in | Wt 170.0 lb

## 2018-03-04 DIAGNOSIS — R058 Other specified cough: Secondary | ICD-10-CM

## 2018-03-04 DIAGNOSIS — R059 Cough, unspecified: Secondary | ICD-10-CM

## 2018-03-04 DIAGNOSIS — R05 Cough: Secondary | ICD-10-CM

## 2018-03-04 DIAGNOSIS — I2609 Other pulmonary embolism with acute cor pulmonale: Secondary | ICD-10-CM

## 2018-03-04 LAB — CBC WITH DIFFERENTIAL/PLATELET
BASOS PCT: 1.8 % (ref 0.0–3.0)
Basophils Absolute: 0.1 10*3/uL (ref 0.0–0.1)
EOS PCT: 2.6 % (ref 0.0–5.0)
Eosinophils Absolute: 0.2 10*3/uL (ref 0.0–0.7)
HCT: 48.1 % (ref 39.0–52.0)
Hemoglobin: 15.8 g/dL (ref 13.0–17.0)
LYMPHS ABS: 3.2 10*3/uL (ref 0.7–4.0)
Lymphocytes Relative: 38.8 % (ref 12.0–46.0)
MCHC: 32.8 g/dL (ref 30.0–36.0)
MCV: 86.5 fl (ref 78.0–100.0)
Monocytes Absolute: 1 10*3/uL (ref 0.1–1.0)
Monocytes Relative: 11.4 % (ref 3.0–12.0)
NEUTROS ABS: 3.8 10*3/uL (ref 1.4–7.7)
NEUTROS PCT: 45.4 % (ref 43.0–77.0)
PLATELETS: 248 10*3/uL (ref 150.0–400.0)
RBC: 5.56 Mil/uL (ref 4.22–5.81)
RDW: 14.4 % (ref 11.5–15.5)
WBC: 8.4 10*3/uL (ref 4.0–10.5)

## 2018-03-04 NOTE — Progress Notes (Signed)
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      02/23/2017 1st [facility] office visit/ Wert   Chief Complaint  Patient presents with  . Pulmonary Consult    Self referral. Pt c/o SOB since March 2016. He states that he gets SOB. He states he gets winded with "doing anything except for sitting down"- but can walk normal pace on a flat surface "all day".    Doe x MMRC2 = can't walk a nl pace on a flat grade s sob but does fine slow and flat eg walking slow indoors at big stores  Onset was sudden assoc with choking on food and obvious reflux symptoms and since the onset March 2016 neither problem has changed for the better or worse on mulitple meds for gerd and copd  In terms of sob never have trouble at rest or problem with sleeping but freq noct sensation of GERD better with special bed to keep hob > 30 degrees / swallowing better if uses apple sauce consistency and turns neck a certain way rec GERD diet  Continue Nexium 40 mg Take 30-60 min before last meal of the day  Fleischner society guidelines do not indicate a need for a directed ct for your 3 mm nodule GI eval :  [place] rec charcoal for gas > improved     DATE OF ADMISSION:  11/11/2017    ADMITTING PHYSICIAN: Henreitta Leber, MD  DATE OF DISCHARGE: 11/14/2017  PRIMARY CARE PHYSICIAN: Leone Haven, MD    ADMISSION DIAGNOSIS:  Sepsis, due to unspecified organism ([place]) [A41.9] Pneumonia of left lower lobe due to infectious organism ([place]) [J18.1] Other acute pulmonary embolism without acute cor pulmonale ([place]) [I26.99]  DISCHARGE DIAGNOSIS:  Active Problems:   Pulmonary embolism ([place])   SECONDARY DIAGNOSIS:       Past Medical History:  Diagnosis Date  . Aspiration precautions    uses thicken in liquids.  Eats pureed food.  . Barrett esophagus   . Depression   . Gallstones   . GERD (gastroesophageal reflux disease)   . Heart murmur    History of  . Hemorrhoids   . Hypertension   . Hypothyroidism   . Iron deficiency anemia 02/10/2012  . Mitral valve disease   . Myalgia    Multiple  . Neuromuscular disorder ([place])   . PAD (peripheral artery disease) ([facility])    lower legs  . Pain syndrome, chronic    Severe  . Pneumonia    three times, last time >10 years ago  . Psoriasis   . Rheumatic heart valve incompetence   . Shortness of breath dyspnea   . Thyroiditis     HOSPITAL COURSE:   1.  Acute  pulmonary embolism.  The patient was started on heparin drip during the hospital course.  The patient was having pleuritic chest pain throughout the hospital course until I started steroids which helped out with the pleuritic chest pain.  Patient was converted over to Eliquis.  Patient will be on 2 tablets of Eliquis 5 mg twice a day for 1 week and then 5 mg twice a day after that. 2.  DVT of the left lower extremity.  Seen by vascular surgeon no indication for IVC filter at this time.  Continue blood thinner. 3.  Pneumonia versus pulmonary infarct.  Antibiotics Rocephin and Zithromax were given for pneumonia.  Blood thinner for pulmonary embolism and possible pulmonary infarct.  Pain control.  I added Solu-Medrol and pain has improved I will give a prednisone taper. 4.  Chronic dysphasia.  Spoke with dietary in the kitchen during the hospital  course to give the patient what he wants and what he can eat for his meals 5.  Hypothyroidism unspecified on levothyroxine 6.  Hyperlipidemia unspecified on Zetia and Praluent at home 7.  BPH on finasteride 8.  Anxiety on clonazepam 9.  Constipation treated during hospital course     03/04/2018  Extended  ov/Wert re: re-establish re chronic cough/ new PE and L lower ext dvt  Chief Complaint  Patient presents with  . Pulmonary Consult    Seen in the past for [facility]- referred back by Dr. Caryl Bis for eval of PE- dxed on 02/23/18. He is taking Eliquis. Breathing has improved back to his normal baseline.   baseline Not limited by breathing from desired activities  > more by muscle aches started after injection for cholesterol in April 2019  Then became much less active for about a month prior to dx of L DVT/ pe as above  Cough assoc with pnds ever since dysphagia(see w/u above)  / using jolley ranchers / sense of excess pnds no better with zyrtec / flonase Sleeps on back 30 degrees   No obvious day to day or daytime variability with cough or ex tol  or assoc excess/ purulent sputum or mucus plugs or hemoptysis or cp or chest tightness, subjective wheeze or overt  hb symptoms.   Sleeping ok as above without nocturnal  or early am exacerbation  of respiratory  c/o's or need for noct saba. Also denies any obvious fluctuation of symptoms with weather or environmental changes or other aggravating or alleviating factors except as outlined above   No unusual exposure hx or h/o childhood pna/ asthma or knowledge of premature birth.  Current Allergies, Complete Past Medical History, Past Surgical History, Family History, and Social History were reviewed in [employer] record.  ROS  The following are not active complaints unless bolded Hoarseness, sore throat, dysphagia, dental problems, itching, sneezing,  nasal congestion or discharge of excess mucus or purulent secretions, ear  ache,   fever, chills, sweats, unintended wt loss or wt gain, classically pleuritic or exertional cp,  orthopnea pnd or arm/hand swelling  or leg swelling, presyncope, palpitations, abdominal pain, anorexia, nausea, vomiting, diarrhea  or change in bowel habits or change in bladder habits, change in stools or change in urine, dysuria, hematuria,  rash, arthralgias/myalgias, visual complaints, headache, numbness, weakness/generalized, improving  or ataxia or problems with walking or coordination,  change in mood or  memory.        Current Meds  Medication Sig  . apixaban (ELIQUIS) 5 MG TABS tablet Take 1 tablet (5 mg  total) by mouth 2 (two) times daily.  [name] azelastine (ASTELIN) 0.1 % nasal spray Place 2 sprays into both nostrils 2 (two) times daily. Use in each nostril as directed  . celecoxib (CELEBREX) 200 MG capsule Take 200 mg by mouth 2 (two) times daily.  . cetirizine (ZYRTEC) 10 MG tablet Take 10 mg by mouth daily.  . clobetasol (TEMOVATE) 0.05 % cream Apply topically as needed. Reported on 12/03/2015  . clonazePAM (KLONOPIN) 2 MG tablet Take 1 tablet (2 mg total) by mouth 3 (three) times daily as needed (severe muscle spasm).  . colestipol (COLESTID) 1 G tablet Take 3 tablets (3 g total) by mouth 2 (two) times daily.  [name] esomeprazole (NEXIUM) 40 MG capsule TAKE 1 CAPSULE DAILY  . ezetimibe (ZETIA) 10 MG tablet Take 1 tablet (10 mg total) by mouth daily.  . finasteride (PROSCAR) 5 MG tablet Take 1 tablet (5 mg total) by mouth daily.  . fluticasone (FLONASE) 50 MCG/ACT nasal spray Place into both nostrils daily as needed for allergies or rhinitis.  [name] GARLIC OIL PO Take by mouth.  . levothyroxine (SYNTHROID, LEVOTHROID) 75 MCG tablet TAKE 1 TABLET DAILY  . Multiple Vitamins-Minerals (CENTRUM SILVER PO) Take 1 tablet by mouth every evening.   . Plant Sterols and Stanols 450 MG CAPS Take by mouth 2 (two) times daily.  [name] RESVERATROL PO Take 1,000 mg by mouth 2 (two) times daily.  . Testosterone  (AXIRON) 30 MG/ACT SOLN Apply 1 pump under each armpit every night               Objective:   Physical Exam   Stoic amb wm sits with legs crossed p repeatedly warned not to do this during the interview/ exam    03/04/2018       170   02/23/17 171 lb 9.6 oz (77.8 kg)  02/10/17 176 lb (79.8 kg)  08/26/16 179 lb 1.9 oz (81.2 kg)    Vital signs reviewed - Note on arrival 02 sats  99% on RA    HEENT: nl dentition, turbinates bilaterally, and oropharynx. Nl external ear canals without cough reflex   NECK :  without JVD/Nodes/TM/ nl carotid upstrokes bilaterally   LUNGS: no acc muscle use,  Nl contour chest which is clear to A and P bilaterally without cough on insp or exp maneuvers   CV:  RRR  no s3 or murmur or increase in P2, and no edema   ABD:  soft and nontender with nl inspiratory excursion in the supine position. No bruits or organomegaly appreciated, bowel sounds nl  MS:  Nl gait/ ext warm without deformities, calf tenderness, cyanosis or clubbing No obvious joint restrictions   SKIN: warm and dry without lesions    NEURO:  alert, approp, nl sensorium with  no motor or cerebellar deficits apparent.      I personally reviewed images and agree with radiology impression as follows:   Chest CTa  11/11/17  1. Positive for acute PE within the lingula with CT evidence of right heart strain (RV/LV Ratio = 1) consistent with at least submassive (intermediate risk) PE.   2. Lingular and left lower lobe airspace disease consistent with pneumonia with reactive left lower paratracheal adenopathy. Differential considerations may include a pulmonary infarct but suspect pneumonia given what appears to be the reactive adenopathy    CT sinus 03/04/2018   Paranasal sinuses are clear.       Assessment:

## 2018-03-04 NOTE — Patient Instructions (Addendum)
Try nexium 40 mg Take 30- 60 min before your first and last meals of the day   For drainage / throat tickle try instead of zyrtec >   CHLORPHENIRAMINE  4 mg - take one every 4 hours as needed - available over the counter- may cause drowsiness so start with just a bedtime dose or two and see how you tolerate it before trying in daytime    Please see patient coordinator before you leave today  to schedule sinus ct   Please remember to go to the lab department downstairs in the basement  for your tests - we will call you with the results when they are available.      Pulmonary follow up can be as needed - I will let your PCP know about recs for blood clots but in meantime stop crossing your legs and resume as much activity as possible

## 2018-03-06 ENCOUNTER — Encounter: Payer: Self-pay | Admitting: Internal Medicine

## 2018-03-06 NOTE — Assessment & Plan Note (Signed)
Dx 11/11/17 by Raechel Ache with L DVT in setting of immobility from muscle aches  - Echo 11/12/17 Left ventricle: The cavity size was normal. There was moderate   concentric hypertrophy. Systolic function was normal. The   estimated ejection fraction was in the range of 60% to 65%. Wall   motion was normal; there were no regional wall motion   abnormalities. Doppler parameters are consistent with abnormal   left ventricular relaxation (grade 1 diastolic dysfunction). - Aortic valve: There was mild stenosis. Mean gradient (S): 9 mm   Hg. Valve area (VTI): 2.06 cm^2. Valve area (Vmax): 1.52 cm^2. - Mitral valve: Calcified annulus. The findings are consistent with   mild stenosis. There was mild regurgitation. Mean gradient (D): 4   mm Hg. Valve area by continuity equation (using LVOT flow): 2.19   cm^2. - Left atrium: The atrium was moderately dilated. - Right ventricle: Systolic function was normal. - Pulmonary arteries: Systolic pressure was mildly increased. PA   peak pressure: 45 mm Hg (S).  There was r heart strain that was disproportionate to PE burden suggesting he may have mild cor pulmonale from from chronic lung dz but no change in recs to how to manage PE / DVT at this point   1) rec 6 months eliquis then re-eval with CTa chest/ venous Doppler and echo 2) if all are normal and he is fully ambulatory, ok off and take asa daily (though benefit for latter is low, cost and risk are extremely low so risk /benefit favors using it) 3) if any lingering chronic concerns for recurrent dvt, consider low dose maint rx indefinitely   Pulmonary f/u can be prn    I had an extended discussion with the patient reviewing all relevant studies completed to date and  lasting 25 minutes of a 40  minute office visit addressing new  Problem of DVT/ PE in pt with chronic  non-specific but potentially very serious refractory respiratory symptoms of uncertain and potentially multiple  etiologies.   Each  maintenance medication was reviewed in detail including most importantly the difference between maintenance and prns and under what circumstances the prns are to be triggered using an action plan format that is not reflected in the computer generated alphabetically organized AVS.    Please see AVS for specific instructions unique to this office visit that I personally wrote and verbalized to the the pt in detail and then reviewed with pt  by my nurse highlighting any changes in therapy/plan of care  recommended at today's visit.

## 2018-03-06 NOTE — Assessment & Plan Note (Signed)
Continues to have sense of pnds refractory to zyrtec so try 1st gen H1 blockers per guidelines  And f/u can be prn    Concerned chronic / recurrent asp will become more of an issue moving forward but nothing else to offer since has already declined recs by GI and ent

## 2018-03-07 LAB — RESPIRATORY ALLERGY PROFILE REGION II ~~LOC~~
Allergen, Cedar tree, t12: <0.1 kU/L
Allergen, D pternoyssinus,d7: <0.1 kU/L
Allergen, Mouse Urine Protein, e78: <0.1 kU/L
Allergen, Oak,t7: <0.1 kU/L
Allergen, P. notatum, m1: <0.1 kU/L
CLASS: 0
CLASS: 0
CLASS: 0
CLASS: 0
CLASS: 0
CLASS: 0
CLASS: 0
CLASS: 0
CLASS: 0
CLASS: 0
CLASS: 0
CLASS: 0
Cat Dander: <0.1 kU/L
Class: 0
Class: 0
Class: 0
Class: 0
Class: 0
Class: 0
Class: 0
Class: 0
Class: 0
Class: 0
Class: 0
Class: 0
Cockroach: <0.1 kU/L
D. farinae: <0.1 kU/L
IGE (IMMUNOGLOBULIN E), SERUM: 15 kU/L (ref <=114)
Timothy Grass: <0.1 kU/L

## 2018-03-07 LAB — INTERPRETATION:

## 2018-03-07 NOTE — Progress Notes (Signed)
Spoke with pt and notified of results per Dr. Wert. Pt verbalized understanding and denied any questions. 

## 2018-03-08 NOTE — Progress Notes (Signed)
Spoke with pt and notified of results per Dr. Wert. Pt verbalized understanding and denied any questions. 

## 2018-03-17 ENCOUNTER — Other Ambulatory Visit: Payer: Self-pay

## 2018-03-17 DIAGNOSIS — M791 Myalgia, unspecified site: Secondary | ICD-10-CM

## 2018-03-17 MED ORDER — CLONAZEPAM 2 MG PO TABS: 2.0000 mg | ORAL_TABLET | Freq: Three times a day (TID) | ORAL | 1 refills | Status: DC | PRN

## 2018-03-17 NOTE — Telephone Encounter (Signed)
Sent to pharmacy.  Controlled substance database reviewed. 

## 2018-03-17 NOTE — Telephone Encounter (Signed)
Fax from [facility]   Pt is requesting a renewal on CLONAZEPAM   Last OV 02/23/2018  Last refilled 12/14/2017 disp 90 with 1 refill   Sent to PCP to advise

## 2018-04-08 ENCOUNTER — Other Ambulatory Visit: Payer: Self-pay | Admitting: Family Medicine

## 2018-04-08 MED ORDER — LEVOTHYROXINE SODIUM 75 MCG PO TABS: 75.0000 ug | ORAL_TABLET | Freq: Every day | ORAL | 1 refills | Status: DC

## 2018-04-08 NOTE — Telephone Encounter (Signed)
Copied from [place] [id number]. Topic: Quick Communication - Rx Refill/Question >> Apr 08, 2018  3:51 PM Selinda Flavin B, NT wrote: Medication: levothyroxine (SYNTHROID, LEVOTHROID) 75 MCG tablet  Has the patient contacted their pharmacy? Yes.   (Agent: If no, request that the patient contact the pharmacy for the refill.) (Agent: If yes, when and what did the pharmacy advise?)  Preferred Pharmacy (with phone number or street name): [facility]  Agent: Please be advised that RX refills may take up to 3 business days. We ask that you follow-up with your pharmacy.

## 2018-04-20 NOTE — Progress Notes (Signed)
Cardiology Office Note  Date:  04/21/2018   ID:  UDAY JANTZ, DOB Sep 25, 1940, MRN [id number]  PCP:  Leone Haven, MD   Chief Complaint  Patient presents with  . other    LS 2016 pt would like to discuss echo results 10/2017 C/o right arm pain. Meds reviewed verbally with pt.    HPI:  Mr. Sparr is a  77 year-old gentleman with past medical history of smoking history,  moderate bilateral iliac arterial disease in 2015, done at [facility] mitral valve prolapse  echocardiogram in 2009 reporting moderate to severe MR (outside study ), hyperlipidemia,  hypertension with diffuse chronic muscle aches of uncertain etiology who presents for routine followup of his PAD, MR. Long prior smoking history for at least 30 years, stopped when he was in his late 69s  Last seen over 3 years ago in clinic Reports having  Fall, subsequent knee trauma, April 2019 Was very sedentary for 1 month secondary to recovering from the trauma Developed DVT and pulmonary embolism, to the emergency room with acute shortness of breath May 2019 Left PE on CT scan 11/11/2017 Started on eliquis  Lower extremity venous Doppler results from May 2019 reviewed with him showing occlusion of popliteal vessel  Reports he has made a full recovery, denies any leg pain walking 1 hr a day  Long history of muscle cramping in his legs improved on clonazepam started years ago Unable to tolerate statins secondary to muscle pain Was on PCSK9 inh, caused side effects, muscle ache,  stopped the medication   Denies any chest pain or shortness of breath Prior records reviewed with him in detail Echocardiogram May 2019 ejection fraction 60 to 65%, mild MR, mildly elevated right heart pressures, moderately dilated left atrium  Cardiac catheterization September 2016 No significant coronary disease to explain shortness of breath, ejection fraction 55% Suspected to have COPD and PFTs were recommended  EKG  personally reviewed by myself on todays visit Shows normal sinus rhythm rate 66 bpm no significant ST or T wave changes  Other past medical history Chronic history of muscle ache in his legs at nighttime, also in his calves when he walks up hills.  He denies any significant near syncope or syncope.   PMH:   has a past medical history of Aspiration precautions, Barrett esophagus, Depression, Gallstones, GERD (gastroesophageal reflux disease), Heart murmur, Hemorrhoids, Hypertension, Hypothyroidism, Iron deficiency anemia (02/10/2012), Mitral valve disease, Myalgia, Neuromuscular disorder ([place]), PAD (peripheral artery disease) ([place]), Pain syndrome, chronic, Pneumonia, Psoriasis, Rheumatic heart valve incompetence, Shortness of breath dyspnea, and Thyroiditis.  PSH:    Past Surgical History:  Procedure Laterality Date  . BACK SURGERY    . CARDIAC CATHETERIZATION N/A 02/22/2015   Procedure: Left Heart Cath and Coronary Angiography;  Surgeon: Minna Merritts, MD;  Location: [place] [facility];  Service: Cardiovascular;  Laterality: N/A;  . CATARACT EXTRACTION W/PHACO Left 07/29/2016   Procedure: CATARACT EXTRACTION PHACO AND INTRAOCULAR LENS PLACEMENT (IOC)  Left eye IVA Topical;  Surgeon: Leandrew Koyanagi, MD;  Location: [place];  Service: Ophthalmology;  Laterality: Left;  . CATARACT EXTRACTION W/PHACO Right 08/19/2016   Procedure: CATARACT EXTRACTION PHACO AND INTRAOCULAR LENS PLACEMENT ([place]) right  TORIC;  Surgeon: Leandrew Koyanagi, MD;  Location: [place];  Service: Ophthalmology;  Laterality: Right;  prefers early morning  . CHOLECYSTECTOMY  08/03/13  . COLONOSCOPY  03-08-12   Dr Donnella Sham  . COLONOSCOPY WITH PROPOFOL N/A 06/17/2017   Procedure: COLONOSCOPY WITH PROPOFOL;  Surgeon: [name], MD;  Location[facility] ENDOSCOPY;  Service: Gastroenterology;  Laterality: N/A;  . DIRECT LARYNGOSCOPY N/A 02/17/2016   Procedure: DIRECT LARYNGOSCOPY;  Surgeon: Jodi Marble,  MD;  Location: [place];  Service: ENT;  Laterality: N/A;  . ESOPHAGEAL MANOMETRY N/A 02/27/2015   Procedure: ESOPHAGEAL MANOMETRY (EM);  Surgeon: Lollie Sails, MD;  Location: [facility] ENDOSCOPY;  Service: Endoscopy;  Laterality: N/A;  . ESOPHAGOGASTRODUODENOSCOPY (EGD) WITH PROPOFOL N/A 02/18/2015   Procedure: ESOPHAGOGASTRODUODENOSCOPY (EGD) WITH PROPOFOL;  Surgeon: Lollie Sails, MD;  Location: [facility] ENDOSCOPY;  Service: Endoscopy;  Laterality: N/A;  . ESOPHAGOGASTRODUODENOSCOPY (EGD) WITH PROPOFOL N/A 06/17/2017   Procedure: ESOPHAGOGASTRODUODENOSCOPY (EGD) WITH PROPOFOL;  Surgeon: [name], MD;  Location: [facility] ENDOSCOPY;  Service: Gastroenterology;  Laterality: N/A;  . ESOPHAGOSCOPY W/ BOTOX INJECTION N/A 02/17/2016   Procedure: ESOPHAGOSCOPY WITH BOTOX INJECTION  ;  Surgeon: Jodi Marble, MD;  Location: [place];  Service: ENT;  Laterality: N/A;  . HEMORROIDECTOMY    . UPPER GI ENDOSCOPY  03-06-2013   Dr Donnella Sham    Current Outpatient Medications  Medication Sig Dispense Refill  . apixaban (ELIQUIS) 5 MG TABS tablet Take 1 tablet (5 mg total) by mouth 2 (two) times daily. 180 tablet 1  . azelastine (ASTELIN) 0.1 % nasal spray Place 2 sprays into both nostrils 2 (two) times daily. Use in each nostril as directed 30 mL 6  . celecoxib (CELEBREX) 200 MG capsule Take 200 mg by mouth 2 (two) times daily.    . cetirizine (ZYRTEC) 10 MG tablet Take 10 mg by mouth daily.    . clobetasol (TEMOVATE) 0.05 % cream Apply topically as needed. Reported on 12/03/2015    . clonazePAM (KLONOPIN) 2 MG tablet Take 1 tablet (2 mg total) by mouth 3 (three) times daily as needed (severe muscle spasm). 90 tablet 1  . colestipol (COLESTID) 1 G tablet Take 3 tablets (3 g total) by mouth 2 (two) times daily. 540 tablet 3  . esomeprazole (NEXIUM) 40 MG capsule TAKE 1 CAPSULE DAILY 90 capsule 3  . ezetimibe (ZETIA) 10 MG tablet TAKE 1 TABLET DAILY 90 tablet 4  . finasteride  (PROSCAR) 5 MG tablet Take 1 tablet (5 mg total) by mouth daily. 90 tablet 3  . fluticasone (FLONASE) 50 MCG/ACT nasal spray Place into both nostrils daily as needed for allergies or rhinitis.    [name] GARLIC OIL PO Take by mouth.    . levothyroxine (SYNTHROID, LEVOTHROID) 75 MCG tablet Take 1 tablet (75 mcg total) by mouth daily. 90 tablet 1  . Multiple Vitamins-Minerals (CENTRUM SILVER PO) Take 1 tablet by mouth every evening.     . Plant Sterols and Stanols 450 MG CAPS Take by mouth 2 (two) times daily.    [name] RESVERATROL PO Take 1,000 mg by mouth 2 (two) times daily.    . Testosterone (AXIRON) 30 MG/ACT SOLN Apply 1 pump under each armpit every night     No current facility-administered medications for this visit.      Allergies:   Duloxetine; Nsaids; Rosuvastatin; Statins; and Tolmetin   Social History:  The patient  reports that he quit smoking about 13 years ago. His smoking use included cigarettes. He has a 30.00 pack-year smoking history. He has never used smokeless tobacco. He reports that he does not drink alcohol or use drugs.   Family History:   family history includes Aneurysm in his sister; Cancer in his sister; Emphysema in his father; Heart failure in  his father and mother.    Review of Systems: Review of Systems  Constitutional: Negative.   Respiratory: Negative.   Cardiovascular: Negative.   Gastrointestinal: Negative.   Musculoskeletal: Positive for myalgias.  Neurological: Negative.   Psychiatric/Behavioral: Negative.   All other systems reviewed and are negative.    PHYSICAL EXAM: VS:  BP (!) 141/86 (BP Location: Right Arm, Patient Position: Sitting, Cuff Size: Normal)   Pulse 66   Ht 6' (1.829 m)   Wt 180 lb 8 oz (81.9 kg)   BMI 24.48 kg/m  , BMI Body mass index is 24.48 kg/m. GEN: Well nourished, well developed, in no acute distress  HEENT: normal  Neck: no JVD, carotid bruits, or masses Cardiac: RRR; no murmurs, rubs, or gallops,no edema  Respiratory:   clear to auscultation bilaterally, normal work of breathing GI: soft, nontender, nondistended, + BS MS: no deformity or atrophy  Skin: warm and dry, no rash Neuro:  Strength and sensation are intact Psych: euthymic mood, full affect  Recent Labs: 08/26/2017: TSH 2.35 11/11/2017: ALT 19 11/26/2017: BUN 21; Creatinine, Ser 1.52; Potassium 4.3; Sodium 141 03/04/2018: Hemoglobin 15.8; Platelets 248.0    Lipid Panel Lab Results  Component Value Date   CHOL 177 08/26/2017   HDL 45.10 08/26/2017   LDLCALC 112 (H) 08/26/2017   TRIG 97.0 08/26/2017    Wt Readings from Last 3 Encounters:  04/21/18 180 lb 8 oz (81.9 kg)  03/04/18 170 lb (77.1 kg)  02/23/18 178 lb 8 oz (81 kg)     ASSESSMENT AND PLAN:  Acute pulmonary embolism with acute cor pulmonale, unspecified pulmonary embolism type (HCC) Provoked DVT lower extremity, pulmonary embolism on the left documented on CT scan Echocardiogram with right heart strain Reports he is made a full recovery Would like to stop anticoagulation after 6 months, in November 2019  PVD (peripheral vascular disease) ([place]) Prior history of moderate iliac disease No recent ultrasound available He continues to have periodic leg pain, aortoiliac ultrasound has been ordered  Chronic obstructive pulmonary disease, unspecified COPD type ([place]) Reports stable breathing, now exercising on a regular basis Has completed prior ischemic work-up with catheterization showing no significant disease  Chronic kidney disease (CKD), stage III (moderate) (HCC) Creatinine 1.5, stable  Disposition:   F/U  12 months   Total encounter time more than 45 minutes  Greater than 50% was spent in counseling and coordination of care with the patient   No orders of the defined types were placed in this encounter.    Signed, Esmond Plants, M.D., Ph.D. 04/21/2018  [facility] [place], [place] [phone] Ms. Sarita Haver was 1 but there were a couple I changed a  couple on her and couple on other people

## 2018-04-21 ENCOUNTER — Ambulatory Visit (INDEPENDENT_AMBULATORY_CARE_PROVIDER_SITE_OTHER): Payer: Medicare Other | Admitting: Cardiovascular Disease

## 2018-04-21 ENCOUNTER — Encounter: Payer: Self-pay | Admitting: Cardiovascular Disease

## 2018-04-21 VITALS — BP 141/86 | HR 66 | Ht 72.0 in | Wt 180.5 lb

## 2018-04-21 DIAGNOSIS — J449 Chronic obstructive pulmonary disease, unspecified: Secondary | ICD-10-CM | POA: Diagnosis not present

## 2018-04-21 DIAGNOSIS — I739 Peripheral vascular disease, unspecified: Secondary | ICD-10-CM | POA: Diagnosis not present

## 2018-04-21 DIAGNOSIS — I2609 Other pulmonary embolism with acute cor pulmonale: Secondary | ICD-10-CM

## 2018-04-21 DIAGNOSIS — N183 Chronic kidney disease, stage 3 unspecified: Secondary | ICD-10-CM

## 2018-04-21 NOTE — Patient Instructions (Addendum)
Medication Instructions:  No changes  If you need a refill on your cardiac medications before your next appointment, please call your pharmacy.    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   If you have labs (blood work) drawn today and your tests are completely normal, you will receive your results only by: [name] MyChart Message (if you have MyChart) OR . A paper copy in the mail If you have any lab test that is abnormal or we need to change your treatment, we will call you to review the results.   Testing/Procedures:    Follow-Up: At [facility], you and your health needs are our priority.  As part of our continuing mission to provide you with exceptional heart care, we have created designated Provider Care Teams.  These Care Teams include your primary Cardiologist (physician) and Advanced Practice Providers (APPs -  Physician Assistants and Nurse Practitioners) who all work together to provide you with the care you need, when you need it.  . You will need a follow up appointment in 12 months .   Please call our office 2 months in advance to schedule this appointment.    . Providers on your designated Care Team:   . Murray Hodgkins, NP . Christell Faith, PA-C . Marrianne Mood, PA-C  Any Other Special Instructions Will Be Listed Below (If Applicable).  For educational health videos Log in to : [email or web address] Or : [email or web address], password : [email or web address]

## 2018-04-26 ENCOUNTER — Ambulatory Visit (INDEPENDENT_AMBULATORY_CARE_PROVIDER_SITE_OTHER): Payer: Medicare Other | Admitting: *Deleted

## 2018-04-26 DIAGNOSIS — Z23 Encounter for immunization: Secondary | ICD-10-CM

## 2018-04-26 NOTE — Progress Notes (Signed)
Patient came into office for second shingrix vaccine for tried to explain to patient that he should have received the shingles vaccine at local pharmacy and not in office . Patient was very upset and demanded he should receive second dose here since he was given the first. Second dose was given.

## 2018-05-04 ENCOUNTER — Ambulatory Visit (INDEPENDENT_AMBULATORY_CARE_PROVIDER_SITE_OTHER): Payer: Medicare Other

## 2018-05-04 DIAGNOSIS — I739 Peripheral vascular disease, unspecified: Secondary | ICD-10-CM

## 2018-05-12 ENCOUNTER — Telehealth: Payer: Self-pay | Admitting: Cardiovascular Disease

## 2018-05-12 DIAGNOSIS — I739 Peripheral vascular disease, unspecified: Secondary | ICD-10-CM

## 2018-05-12 NOTE — Telephone Encounter (Signed)
I left a message for the patient to call back for results.  

## 2018-05-12 NOTE — Telephone Encounter (Signed)
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 No intervention needed for severe stenosis No aneurysm noted As mentioned by sonographer ultrasound did not go down to the toes If he would like ultrasound can be done that will look down his thighs to the toes for obstructive blood flow

## 2018-05-12 NOTE — Telephone Encounter (Signed)
The patient is aware of his results. He would like to pursue further testing of his lower extremities. I advised we will order an ABI w/ Lower extremity arterial duplex.   The patient is aware scheduling will call him to arrange.  He is agreeable.

## 2018-06-05 IMAGING — CT CT ABD-PELV W/ CM
1 of 3 series · 13 of 32 positions shown, 18 images · IV contrast (iopamidol)
Comparison: CT abdomen pelvis of 05/23/2015 and CT chest abdomen
pelvis of 04/08/2004

CLINICAL DATA: Abdominal distention, unintended weight loss

EXAM:
CT ABDOMEN AND PELVIS WITH CONTRAST
TECHNIQUE: Multidetector CT imaging of the abdomen and pelvis was performed
using the standard protocol following bolus administration of
intravenous contrast.
CONTRAST:  100mL [id number] IOPAMIDOL ([id number]) INJECTION 61%

[Series 2: axial st · axial · 0.76mm/px · z∈[-1099,-664]mm · 13 of 99 slices shown, 18 images]
[im 6/99  soft-tissue]
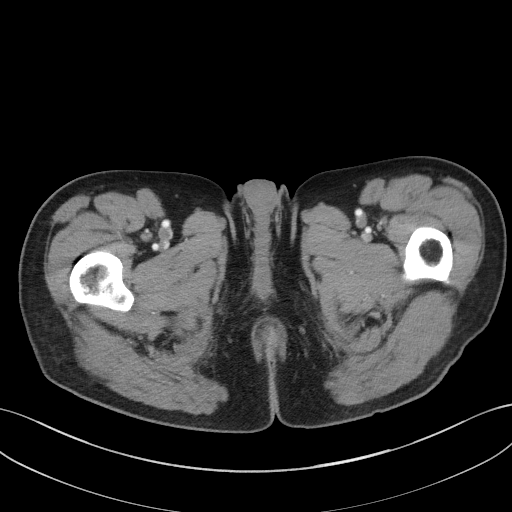
[im 6/99  bone]
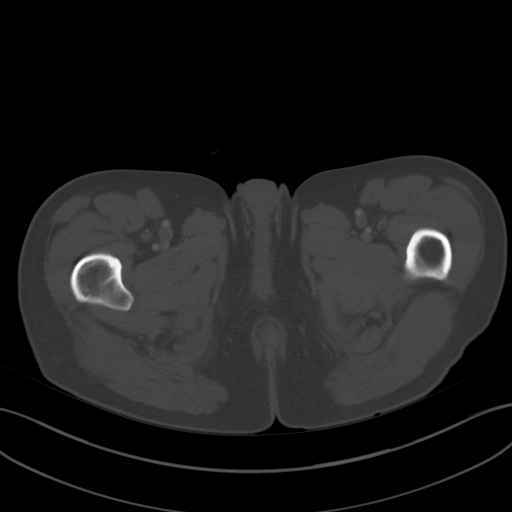
[im 17/99  soft-tissue]
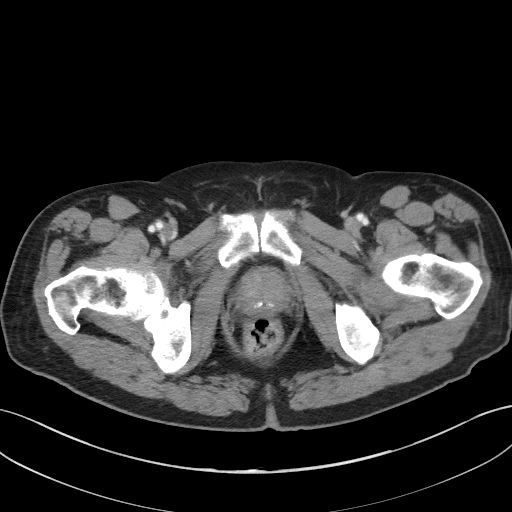
[im 22/99  soft-tissue]
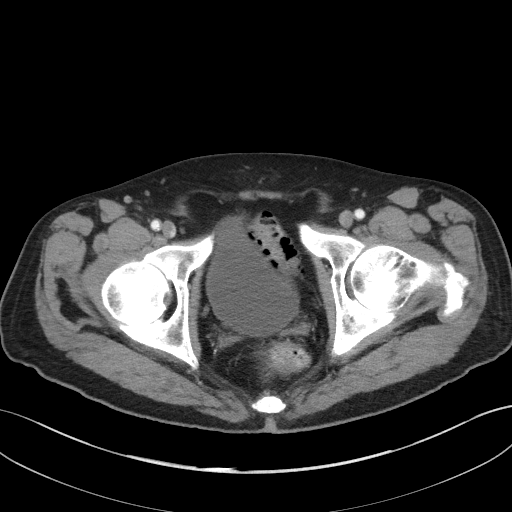
[im 28/99  soft-tissue]
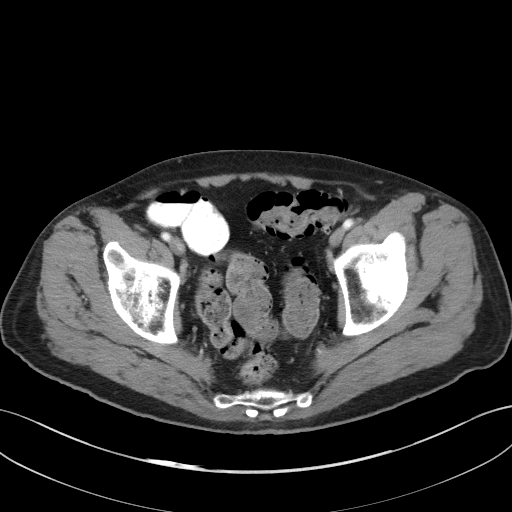
[im 39/99  soft-tissue]
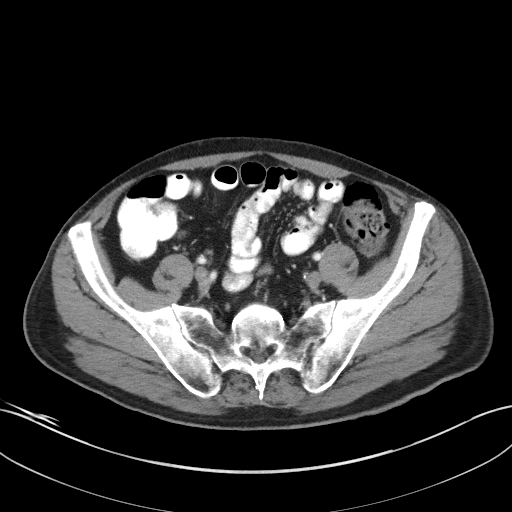
[im 44/99  soft-tissue]
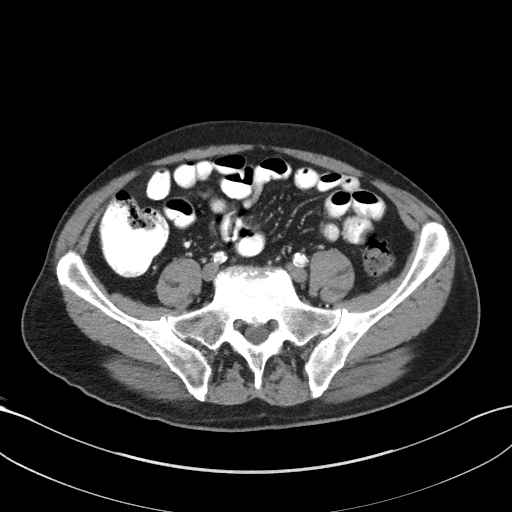
[im 55/99  soft-tissue]
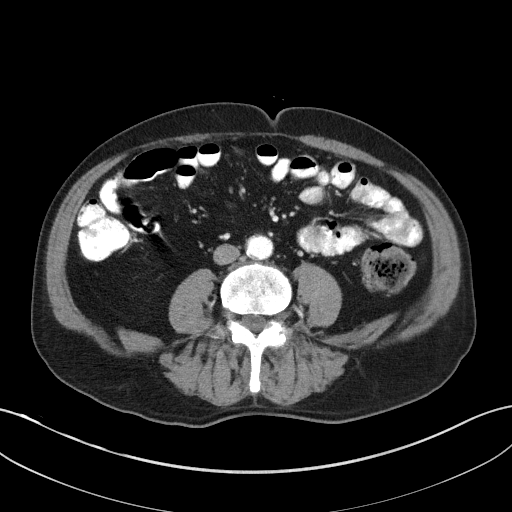
[im 60/99  soft-tissue]
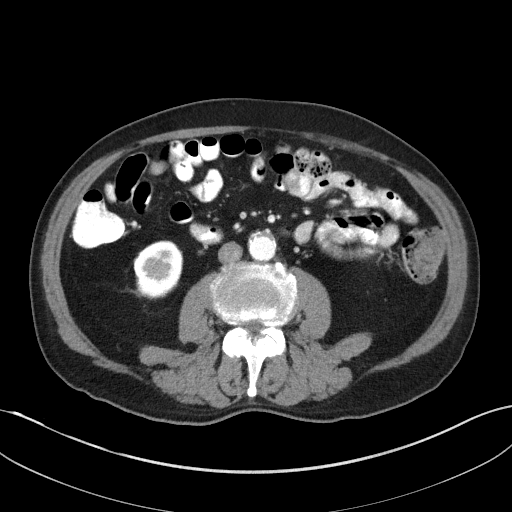
[im 71/99  soft-tissue]
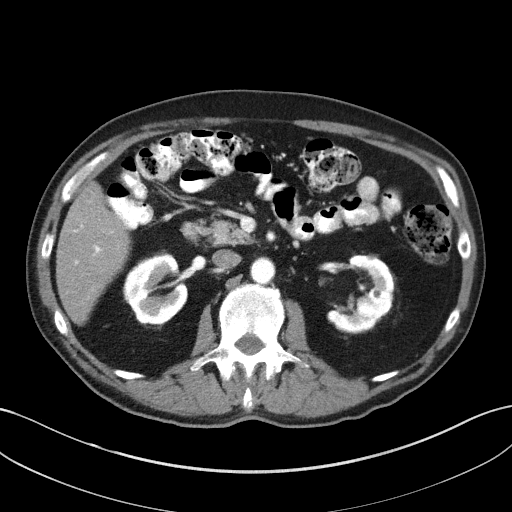
[im 71/99  bone]
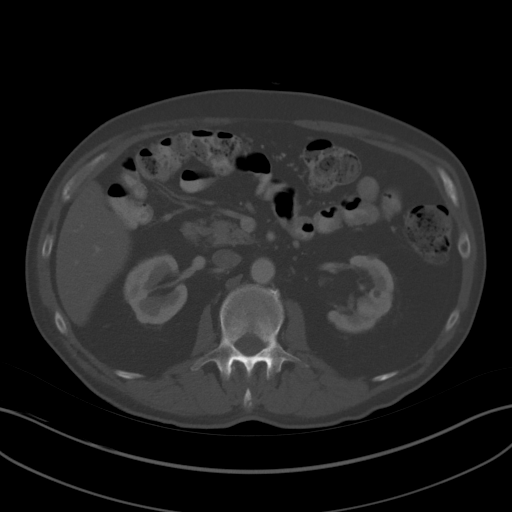
[im 77/99  soft-tissue]
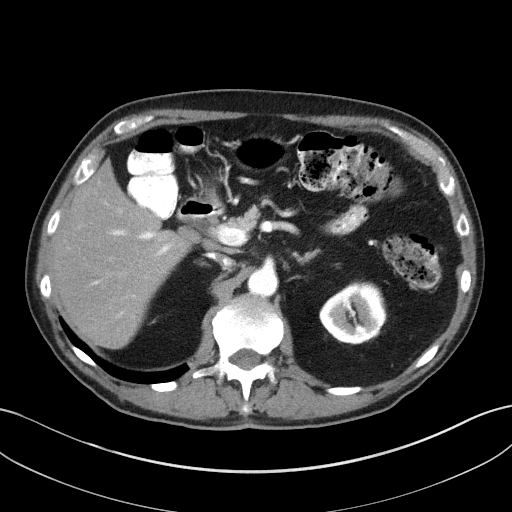
[im 77/99  lung]
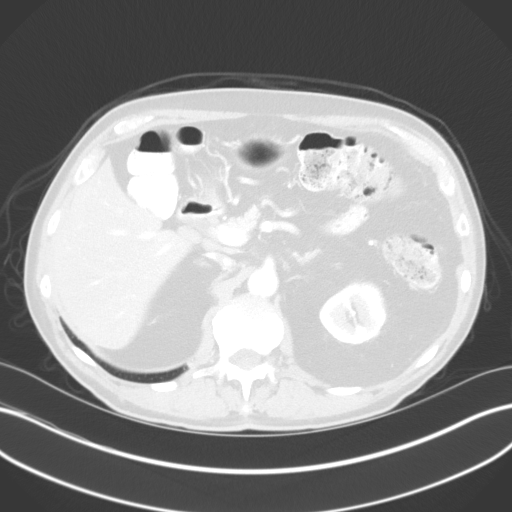
[im 82/99  soft-tissue]
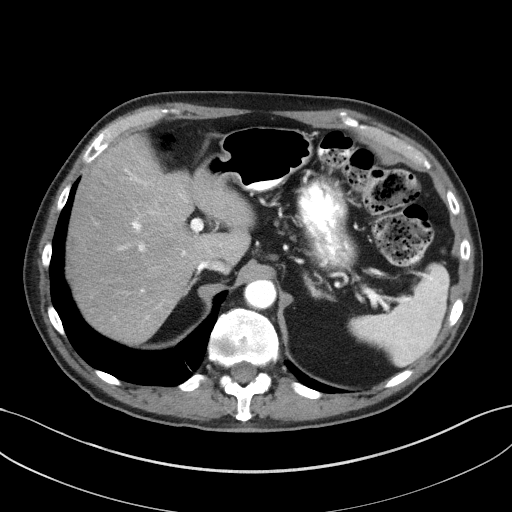
[im 82/99  lung]
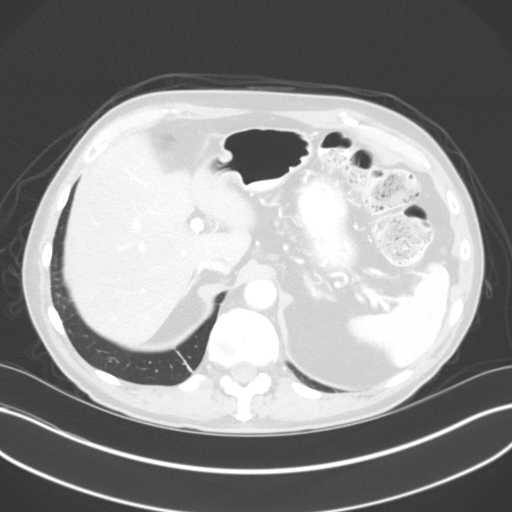
[im 88/99  lung]
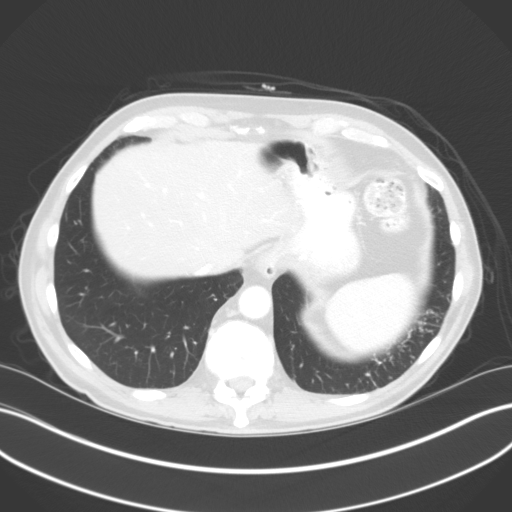
[im 93/99  soft-tissue]
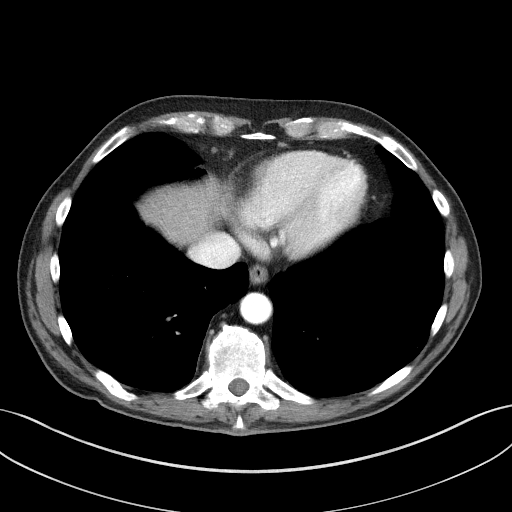
[im 93/99  lung]
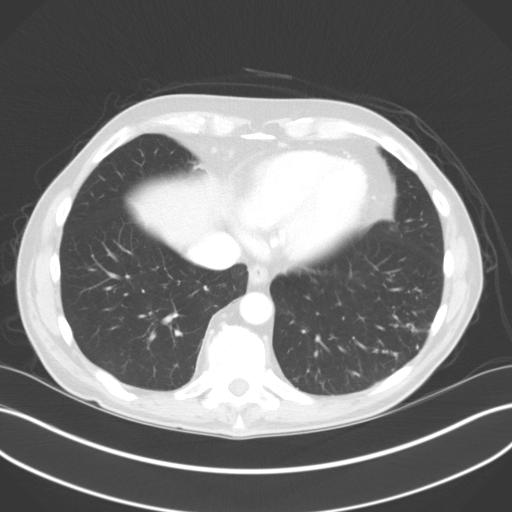

[13 of 32 positions shown; findings below may reference images not displayed]

FINDINGS: Lower chest: On lung window images, there are prominent markings
within the left lower lobe. Some these changes could be due to
aspiration chronically, being unchanged from the prior CTs. On the
very first image a nodule in the left lower lobe appears unchanged
compared prior study measuring 6 mm in diameter, consistent with a
benign process.

Hepatobiliary: The liver appears somewhat low in attenuation
possibly indicating fatty infiltration. Surgical clips are noted
prior cholecystectomy. No focal hepatic abnormality is seen.

Pancreas: The pancreas is normal in size and the pancreatic duct is
not dilated.

Spleen: The spleen is unremarkable.

Adrenals/Urinary Tract: The adrenal glands appear normal. No renal
calculi are seen. Tiny renal cysts are noted bilaterally. No
hydronephrosis is seen. On delayed images the pelvocaliceal systems
are unremarkable. The ureters appear normal in caliber to the
bladder. The urinary bladder is unremarkable other than indentation
inferiorly by somewhat prominent prostate.

Stomach/Bowel: The stomach is slightly distended with oral contrast
media. No small bowel distention is seen. There are a few scattered
rectosigmoid colon diverticula but no diverticulitis is seen. A
moderate amount of feces is noted diffusely throughout the colon.
The terminal ileum is unremarkable. The appendix is not visualized,
and no inflammatory process is seen within the right lower quadrant
appear

Vascular/Lymphatic: The abdominal aorta is normal in caliber with
moderate abdominal aortic atherosclerosis present. No adenopathy is
seen.

Reproductive: The prostate gland is prominent with a few prostatic
calcifications noted.

Other: None.

Musculoskeletal: Degenerative disc disease is present at L5-S1. No
compression deformity is seen. A sclerotic focus within the upper
sacrum is stable.
IMPRESSION: 1. No explanation for the patient's abdominal distention and weight
loss is seen. No mass or adenopathy is noted.
2. Question fatty infiltration of the liver.  Correlate with LFTs.
3. Moderate amount of feces throughout the colon. Few scattered
diverticula within the rectosigmoid colon.
4. Moderate abdominal aortic atherosclerosis.
5. Chronic changes within the left lower lobe.
6. Prominent prostate.

## 2018-06-06 ENCOUNTER — Telehealth: Payer: Self-pay

## 2018-06-06 DIAGNOSIS — M791 Myalgia, unspecified site: Secondary | ICD-10-CM

## 2018-06-06 MED ORDER — CLONAZEPAM 2 MG PO TABS: 2.0000 mg | ORAL_TABLET | Freq: Three times a day (TID) | ORAL | 1 refills | Status: DC | PRN

## 2018-06-06 NOTE — Telephone Encounter (Signed)
Pt's wife calling.  States that this medication was supposed to be sent to [facility] but instead it was called in to [facility]. Pt's insurance will not cover this medication at [facility].  Pt's wife requesting this be cancelled at [facility] and resent to [facility]. Mardene Celeste can be reached at [phone]

## 2018-06-06 NOTE — Telephone Encounter (Signed)
Controlled substance database reviewed. Sent to pharmacy.   

## 2018-06-06 NOTE — Telephone Encounter (Signed)
Can clonazepam please be reordered at [facility].

## 2018-06-06 NOTE — Telephone Encounter (Signed)
Fax from [facility]   Refill for clonazepam 2 Mg for Muscle spasm   Last OV last refilled 02/23/2018   Last refilled 03/17/2018 disp 90 with 1 refill   Sent to PCP for approval

## 2018-06-07 MED ORDER — CLONAZEPAM 2 MG PO TABS: 2.0000 mg | ORAL_TABLET | Freq: Three times a day (TID) | ORAL | 1 refills | Status: DC | PRN

## 2018-06-07 NOTE — Telephone Encounter (Signed)
Sent to mail order

## 2018-06-07 NOTE — Telephone Encounter (Signed)
Dr. Caryl Bis I called to have Rx canceled at [facility] could you resend this to [facility]?

## 2018-06-07 NOTE — Addendum Note (Signed)
Addended by: Leone Haven on: 06/07/2018 04:13 PM   Modules accepted: Orders

## 2018-06-08 NOTE — Telephone Encounter (Signed)
Called pt and left a VM to advise RX has been resent to [facility].

## 2018-06-09 ENCOUNTER — Ambulatory Visit (INDEPENDENT_AMBULATORY_CARE_PROVIDER_SITE_OTHER): Payer: Medicare Other

## 2018-06-09 DIAGNOSIS — I739 Peripheral vascular disease, unspecified: Secondary | ICD-10-CM | POA: Diagnosis not present

## 2018-06-24 ENCOUNTER — Ambulatory Visit (INDEPENDENT_AMBULATORY_CARE_PROVIDER_SITE_OTHER): Payer: Medicare Other | Admitting: Family Medicine

## 2018-06-24 VITALS — BP 136/82 | HR 64 | Temp 97.7°F | Wt 181.2 lb

## 2018-06-24 DIAGNOSIS — E291 Testicular hypofunction: Secondary | ICD-10-CM

## 2018-06-24 DIAGNOSIS — R1312 Dysphagia, oropharyngeal phase: Secondary | ICD-10-CM | POA: Diagnosis not present

## 2018-06-24 DIAGNOSIS — I739 Peripheral vascular disease, unspecified: Secondary | ICD-10-CM

## 2018-06-24 DIAGNOSIS — R7303 Prediabetes: Secondary | ICD-10-CM

## 2018-06-24 DIAGNOSIS — I2609 Other pulmonary embolism with acute cor pulmonale: Secondary | ICD-10-CM

## 2018-06-24 DIAGNOSIS — M654 Radial styloid tenosynovitis [de Quervain]: Secondary | ICD-10-CM

## 2018-06-24 DIAGNOSIS — M791 Myalgia, unspecified site: Secondary | ICD-10-CM

## 2018-06-24 DIAGNOSIS — E7849 Other hyperlipidemia: Secondary | ICD-10-CM | POA: Diagnosis not present

## 2018-06-24 LAB — COMPREHENSIVE METABOLIC PANEL
ALBUMIN: 4 g/dL (ref 3.5–5.2)
ALT: 18 U/L (ref 0–53)
AST: 23 U/L (ref 0–37)
Alkaline Phosphatase: 75 U/L (ref 39–117)
BUN: 15 mg/dL (ref 6–23)
CHLORIDE: 105 meq/L (ref 96–112)
CO2: 25 mEq/L (ref 19–32)
CREATININE: 1.15 mg/dL (ref 0.40–1.50)
Calcium: 9.3 mg/dL (ref 8.4–10.5)
GFR: 65.86 mL/min (ref >60.00)
GLUCOSE: 99 mg/dL (ref 70–99)
POTASSIUM: 4.3 meq/L (ref 3.5–5.1)
SODIUM: 140 meq/L (ref 135–145)
TOTAL PROTEIN: 6.5 g/dL (ref 6.0–8.3)
Total Bilirubin: 0.5 mg/dL (ref 0.2–1.2)

## 2018-06-24 LAB — LDL CHOLESTEROL, DIRECT: LDL DIRECT: 146 mg/dL

## 2018-06-24 LAB — HEMOGLOBIN A1C: Hgb A1c MFr Bld: 5.6 % (ref 4.6–6.5)

## 2018-06-24 NOTE — Patient Instructions (Signed)
Nice to see you. We will get lab work today and contact you with the results. Dr. Melvyn Novas recommended CT angiogram of your chest as well as ultrasound of your leg and an echo prior to determining discontinuation of Eliquis.  We will touch base with him and arrange for these studies.  Please continue the Eliquis until these are completed. If you would like to consider imaging of your back for your leg symptoms please let us know.

## 2018-06-24 NOTE — Progress Notes (Signed)
 , MD Phone: [phone]  Timothy Turner is a 77 y.o. male who presents today for f/u.  CC: Dysphagia, wrist pain, myalgias, PE, claudication symptoms, hypogonadism  Dysphagia: Patient saw neurology.  They had him do speech therapy.  The swallowing is significantly improved and he can eat just about anything.  There was no specific cause found.  Myalgias: Patient notes these went completely away in October.  His hands were the last thing to improve.  This was related to his cholesterol medication.  Wrist pain: Patient saw orthopedics and was diagnosed with de Quervain's tenosynovitis.  He received an injection and wore a brace for 3 weeks.  He notes no additional pain.  PE: He notes no chest pain or shortness of breath.  He continues on Eliquis.  He has had no bleeding.  He wonders about coming off of the Eliquis.  Based on review of his pulmonologists note they recommended CT angiogram, ultrasound, and echo and then determining whether or not he can come off of the blood thinner.  Claudication symptoms: Patient underwent evaluation with Dr. Gollan.  He had extensive heterogeneous atherosclerosis throughout bilateral iliac arteries.  No intervention was recommended.  He underwent ABIs which revealed no evidence of significant lower extremity arterial disease.  He notes continued symptoms with walking.  He does note he can walk for an hour though.  He describes his symptoms as numbness in his bilateral posterior calves and the plantar surface of his foot.  He does note some pain with walking.  He notes no back pain.  He does note his symptoms improve when he stops walking though he does have some numbness if he sits for long period of time.  Hyperlipidemia: Patient notes he is altered his diet and is eating a low-cholesterol diet.  He is also on plant sterols to help with this.  He would like his labs rechecked.  Hypogonadism: He continues on testosterone.  He has been on  this for 5 to 7 years per his report.  He is followed by urology for this.    Social History   Tobacco Use  Smoking Status Former Smoker  . Packs/day: 1.00  . Years: 30.00  . Pack years: 30.00  . Types: Cigarettes  . Last attempt to quit: 06/22/2004  . Years since quitting: 14.0  Smokeless Tobacco Never Used     ROS see history of present illness  Objective  Physical Exam Vitals:   06/24/18 0830  BP: 136/82  Pulse: 64  Temp: 97.7 F (36.5 C)  SpO2: 96%    BP Readings from Last 3 Encounters:  06/24/18 136/82  04/21/18 (!) 141/86  03/04/18 126/70   Wt Readings from Last 3 Encounters:  06/24/18 181 lb 3.2 oz (82.2 kg)  04/21/18 180 lb 8 oz (81.9 kg)  03/04/18 170 lb (77.1 kg)    Physical Exam Constitutional:      General: He is not in acute distress.    Appearance: He is not diaphoretic.  Cardiovascular:     Rate and Rhythm: Normal rate and regular rhythm.     Heart sounds: Normal heart sounds.  Pulmonary:     Effort: Pulmonary effort is normal.     Breath sounds: Normal breath sounds.  Musculoskeletal:     Right lower leg: No edema.     Left lower leg: No edema.  Skin:    General: Skin is warm and dry.  Neurological:     Mental Status: He is alert.       Comments: CN 2-12 intact, 5/5 strength in bilateral biceps, triceps, grip, quads, hamstrings, plantar and dorsiflexion, sensation to light touch intact in bilateral UE and LE, normal gait      Assessment/Plan: Please see individual problem list.  Pulmonary embolism (HCC) Asymptomatic.  Prior pulmonology plan reviewed.  CT angiogram, ultrasound, and echo ordered.  We will send a message to the patient's pulmonologist as well.  He will continue Eliquis at this time.  Dysphagia, oropharyngeal phase Significantly improved.  He will continue his exercises.  Hypogonadism in male Currently on testosterone supplementation through his urologist.  We will send a message to them regarding testosterone  supplementation with his history of PE.  Familial multiple lipoprotein-type hyperlipidemia Check LDL.  Myalgia Resolved.  He will monitor for recurrence.  De Quervain's tenosynovitis Improved.  He will monitor for recurrence.  Intermittent claudication (HCC) Patient reports possible intermittent claudication symptoms.  He has undergone evaluation through his cardiologist with no significant cause found.  I wonder if this could be pseudoclaudication.  We discussed evaluating his back though we opted to defer this until his follow-up studies for his PE have been completed.  Staff message sent to Dr Wert.  Staff message sent to Dr Stoioff.  Orders Placed This Encounter  Procedures  . CT Angio Chest W/Cm &/Or Wo Cm    Standing Status:   Future    Standing Expiration Date:   09/23/2019    Order Specific Question:   If indicated for the ordered procedure, I authorize the administration of contrast media per Radiology protocol    Answer:   Yes    Order Specific Question:   Preferred imaging location?    Answer:   [facility]    Order Specific Question:   Radiology Contrast Protocol - do NOT remove file path    Answer:   [email or web address]  . US Venous Img Lower Bilateral    Standing Status:   Future    Standing Expiration Date:   08/23/2019    Order Specific Question:   Reason for Exam (SYMPTOM  OR DIAGNOSIS REQUIRED)    Answer:   prior DVT, follow-up after treatment    Order Specific Question:   Preferred imaging location?    Answer:   [facility]  . Comp Met (CMET)  . Direct LDL  . HgB A1c  . ECHOCARDIOGRAM COMPLETE    Standing Status:   Future    Standing Expiration Date:   09/23/2019    Order Specific Question:   Where should this test be performed    Answer:   [facility]    Order Specific Question:   Please indicate who you request to read the echo results.    Answer:   [facility]    Order Specific Question:   Perflutren  DEFINITY (image enhancing agent) should be administered unless hypersensitivity or allergy exist    Answer:   Administer Perflutren    Order Specific Question:   Reason for exam-Echo    Answer:   Pulmonary Embolus  415.19 / I26.99    No orders of the defined types were placed in this encounter.     , MD [facility]  

## 2018-06-25 ENCOUNTER — Telehealth: Payer: Self-pay | Admitting: Family Medicine

## 2018-06-25 ENCOUNTER — Encounter: Payer: Self-pay | Admitting: Family Medicine

## 2018-06-25 DIAGNOSIS — I739 Peripheral vascular disease, unspecified: Secondary | ICD-10-CM | POA: Insufficient documentation

## 2018-06-25 DIAGNOSIS — M654 Radial styloid tenosynovitis [de Quervain]: Secondary | ICD-10-CM | POA: Insufficient documentation

## 2018-06-25 NOTE — Assessment & Plan Note (Signed)
Patient reports possible intermittent claudication symptoms.  He has undergone evaluation through his cardiologist with no significant cause found.  I wonder if this could be pseudoclaudication.  We discussed evaluating his back though we opted to defer this until his follow-up studies for his PE have been completed.

## 2018-06-25 NOTE — Telephone Encounter (Signed)
See message from pulmonology.

## 2018-06-25 NOTE — Assessment & Plan Note (Signed)
Significantly improved.  He will continue his exercises.

## 2018-06-25 NOTE — Assessment & Plan Note (Signed)
Asymptomatic.  Prior pulmonology plan reviewed.  CT angiogram, ultrasound, and echo ordered.  We will send a message to the patient's pulmonologist as well.  He will continue Eliquis at this time.

## 2018-06-25 NOTE — Assessment & Plan Note (Signed)
Check LDL. 

## 2018-06-25 NOTE — Assessment & Plan Note (Signed)
Improved.  He will monitor for recurrence. 

## 2018-06-25 NOTE — Assessment & Plan Note (Signed)
Resolved.  He will monitor for recurrence.

## 2018-06-25 NOTE — Telephone Encounter (Signed)
-----   Message from Tanda Rockers, MD sent at 06/25/2018 10:01 AM EST ----- Regarding: RE: PE follow-up Yes that sounds good - if all studies are nl then asa daily but there's little evidence to support this (or whether to use high or low dose) and low threshold to d/c if any gib  Thx  ----- Message ----- From: Leone Haven, MD Sent: 06/25/2018   9:38 AM EST To: Tanda Rockers, MD Subject: PE follow-up                                   Hi Dr Melvyn Novas,   I saw Mr Fullington for follow-up this past week and wanted to check with you regarding his PE treatment. He has been doing quite well. He has been on eliquis for >6 months at this time. It appears that you previously recommended a follow-up CTA, [place], and echo after 6 months of treatment to help guide further treatment. I have ordered these studies though wanted to check with you to see if this is what you would still recommend. Thank you for your help.  [name]

## 2018-06-25 NOTE — Assessment & Plan Note (Signed)
Currently on testosterone supplementation through his urologist.  We will send a message to them regarding testosterone supplementation with his history of PE.

## 2018-06-27 ENCOUNTER — Telehealth: Payer: Self-pay | Admitting: Family Medicine

## 2018-06-27 NOTE — Telephone Encounter (Signed)
Called and spoke with patient. Pt advised and voiced understanding.  

## 2018-06-27 NOTE — Telephone Encounter (Signed)
Please let the patient know that I heard back from his Urologist who noted he should be able to continue on the testosterone supplement. Thanks.

## 2018-06-27 NOTE — Telephone Encounter (Signed)
-----   Message from Abbie Sons, MD sent at 06/26/2018  6:43 PM EST ----- Regarding: RE: Question about testosterone supplement Thanks for checking.  There should be no problem with continuing testosterone.  Unlike estrogen there is not an increased risk of DVT with testosterone replacement.  Regards, Scott ----- Message ----- From: Leone Haven, MD Sent: 06/25/2018   9:43 AM EST To: Abbie Sons, MD Subject: Question about testosterone supplement         Hi Dr Bernardo Heater,   I saw Mr Huseby in follow-up this past week. 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 I noticed that he is on testosterone supplementation and I wanted to check with you to determine if his recent PE/DVT would preclude him from taking testosterone. Thank you for your help.   [name]

## 2018-07-06 ENCOUNTER — Ambulatory Visit
Admission: RE | Admit: 2018-07-06 | Discharge: 2018-07-06 | Disposition: A | Discharge status: Home / Self Care | Payer: Medicare Other | Source: Ambulatory Visit | Attending: Family Medicine | Admitting: Family Medicine

## 2018-07-06 DIAGNOSIS — I2609 Other pulmonary embolism with acute cor pulmonale: Secondary | ICD-10-CM | POA: Diagnosis not present

## 2018-07-06 MED ORDER — IOHEXOL 350 MG/ML SOLN
75.0000 mL | Freq: Once | INTRAVENOUS | Status: AC | PRN
  Administered 2018-07-06: 75 mL via INTRAVENOUS

## 2018-07-07 ENCOUNTER — Ambulatory Visit (HOSPITAL_BASED_OUTPATIENT_CLINIC_OR_DEPARTMENT_OTHER)
Admission: RE | Admit: 2018-07-07 | Discharge: 2018-07-07 | Disposition: A | Discharge status: Home / Self Care | Payer: Medicare Other | Source: Ambulatory Visit | Attending: Family Medicine | Admitting: Family Medicine

## 2018-07-07 DIAGNOSIS — I2609 Other pulmonary embolism with acute cor pulmonale: Secondary | ICD-10-CM | POA: Diagnosis not present

## 2018-07-07 NOTE — Progress Notes (Signed)
*  PRELIMINARY RESULTS* Echocardiogram 2D Echocardiogram has been performed.  Timothy Turner 07/07/2018, 10:49 AM

## 2018-07-08 ENCOUNTER — Other Ambulatory Visit: Payer: Self-pay | Admitting: Family Medicine

## 2018-07-08 DIAGNOSIS — E041 Nontoxic single thyroid nodule: Secondary | ICD-10-CM

## 2018-07-18 ENCOUNTER — Ambulatory Visit
Admission: RE | Admit: 2018-07-18 | Discharge: 2018-07-18 | Disposition: A | Discharge status: Home / Self Care | Payer: Medicare Other | Source: Ambulatory Visit | Attending: Family Medicine | Admitting: Family Medicine

## 2018-07-18 DIAGNOSIS — I2609 Other pulmonary embolism with acute cor pulmonale: Secondary | ICD-10-CM | POA: Diagnosis present

## 2018-07-18 DIAGNOSIS — E041 Nontoxic single thyroid nodule: Secondary | ICD-10-CM | POA: Diagnosis present

## 2018-07-20 ENCOUNTER — Ambulatory Visit (INDEPENDENT_AMBULATORY_CARE_PROVIDER_SITE_OTHER): Payer: Medicare Other | Admitting: Urology

## 2018-07-20 ENCOUNTER — Encounter: Payer: Self-pay | Admitting: Urology

## 2018-07-20 VITALS — BP 114/73 | HR 91 | Ht 72.0 in | Wt 174.9 lb

## 2018-07-20 DIAGNOSIS — E291 Testicular hypofunction: Secondary | ICD-10-CM | POA: Diagnosis not present

## 2018-07-20 DIAGNOSIS — N4232 Atypical small acinar proliferation of prostate: Secondary | ICD-10-CM | POA: Diagnosis not present

## 2018-07-20 NOTE — Progress Notes (Signed)
07/20/2018 8:36 AM   Timothy Turner 1941-05-19 [id number]  Referring provider: Leone Haven, MD [address] [place], [place] [address]  Chief Complaint  Patient presents with  . Prostate Cancer   Urologic history: 1.  Prostate biopsy by Dr. 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  Started on finasteride and PSA has remained stable.  Was not rebiopsied  2.  Hypogonadism; on Axiron   HPI: 77 year old male presents for a six-month follow-up.  He states he has done well since his last visit and has no bothersome lower urinary tract symptoms.  IPSS completed today was 1/1.  His uncorrected PSA at last visit was 1.4.  He remains on finasteride.  He remains on TRT with good energy level and libido.   PMH: Past Medical History:  Diagnosis Date  . Aspiration precautions    uses thicken in liquids.  Eats pureed food.  . Barrett esophagus   . Depression   . Gallstones   . GERD (gastroesophageal reflux disease)   . Heart murmur    History of  . Hemorrhoids   . Hypertension   . Hypothyroidism   . Iron deficiency anemia 02/10/2012  . Mitral valve disease   . Myalgia    Multiple  . Neuromuscular disorder ([place])   . PAD (peripheral artery disease) (HCC)    lower legs  . Pain syndrome, chronic    Severe  . Pneumonia    three times, last time >10 years ago  . Psoriasis   . Rheumatic heart valve incompetence   . Shortness of breath dyspnea   . Thyroiditis     Surgical History: Past Surgical History:  Procedure Laterality Date  . BACK SURGERY    . CARDIAC CATHETERIZATION N/A 02/22/2015   Procedure: Left Heart Cath and Coronary Angiography;  Surgeon: Minna Merritts, MD;  Location: [facility];  Service: Cardiovascular;  Laterality: N/A;  . CATARACT EXTRACTION W/PHACO Left 07/29/2016   Procedure: CATARACT EXTRACTION PHACO AND INTRAOCULAR LENS PLACEMENT ([facility])  Left eye IVA  Topical;  Surgeon: Leandrew Koyanagi, MD;  Location: [facility];  Service: Ophthalmology;  Laterality: Left;  . CATARACT EXTRACTION W/PHACO Right 08/19/2016   Procedure: CATARACT EXTRACTION PHACO AND INTRAOCULAR LENS PLACEMENT ([place]) right  TORIC;  Surgeon: Leandrew Koyanagi, MD;  Location: [facility];  Service: Ophthalmology;  Laterality: Right;  prefers early morning  . CHOLECYSTECTOMY  08/03/13  . COLONOSCOPY  03-08-12   Dr Donnella Sham  . COLONOSCOPY WITH PROPOFOL N/A 06/17/2017   Procedure: COLONOSCOPY WITH PROPOFOL;  Surgeon: [name], MD;  Location[facility];  Service: Gastroenterology;  Laterality: N/A;  . DIRECT LARYNGOSCOPY N/A 02/17/2016   Procedure: DIRECT LARYNGOSCOPY;  Surgeon: Jodi Marble, MD;  Location: [facility];  Service: ENT;  Laterality: N/A;  . ESOPHAGEAL MANOMETRY N/A 02/27/2015   Procedure: ESOPHAGEAL MANOMETRY (EM);  Surgeon: Lollie Sails, MD;  Location: [facility] ENDOSCOPY;  Service: Endoscopy;  Laterality: N/A;  . ESOPHAGOGASTRODUODENOSCOPY (EGD) WITH PROPOFOL N/A 02/18/2015   Procedure: ESOPHAGOGASTRODUODENOSCOPY (EGD) WITH PROPOFOL;  Surgeon: Lollie Sails, MD;  Location: [facility] ENDOSCOPY;  Service: Endoscopy;  Laterality: N/A;  . ESOPHAGOGASTRODUODENOSCOPY (EGD) WITH PROPOFOL N/A 06/17/2017   Procedure: ESOPHAGOGASTRODUODENOSCOPY (EGD) WITH PROPOFOL;  Surgeon: [name], MD;  Location: [facility] ENDOSCOPY;  Service: Gastroenterology;  Laterality: N/A;  . ESOPHAGOSCOPY W/ BOTOX INJECTION N/A 02/17/2016   Procedure: ESOPHAGOSCOPY WITH BOTOX INJECTION  ;  Surgeon: Jodi Marble, MD;  Location: [facility];  Service: ENT;  Laterality: N/A;  . HEMORROIDECTOMY    . UPPER GI ENDOSCOPY  03-06-2013   Dr Donnella Sham    Home Medications:  Allergies as of 07/20/2018      Reactions   Duloxetine    Unable to urinate   Nsaids Other (See Comments)   CANNOT TAKE NSAIDS BECAUSE OF ESOPHAGUS ISSUES CANNOT TAKE NSAIDS BECAUSE OF  ESOPHAGUS ISSUES   Rosuvastatin Other (See Comments)   SEVERE MUSCLE ACHES SEVERE MUSCLE ACHES   Statins Other (See Comments)   SEVERE MUSCLE ACHES SEVERE MUSCLE ACHES Other reaction(s): Other (See Comments) SEVERE MUSCLE ACHES   Tolmetin Other (See Comments)   CANNOT TAKE NSAIDS BECAUSE OF ESOPHAGUS ISSUES CANNOT TAKE NSAIDS BECAUSE OF ESOPHAGUS ISSUES CANNOT TAKE NSAIDS BECAUSE OF ESOPHAGUS ISSUES CANNOT TAKE NSAIDS BECAUSE OF ESOPHAGUS ISSUES      Medication List       Accurate as of July 20, 2018  8:36 AM. Always use your most recent med list.        apixaban 5 MG Tabs tablet Commonly known as:  ELIQUIS Take 1 tablet (5 mg total) by mouth 2 (two) times daily.   AXIRON 30 MG/ACT Soln Generic drug:  Testosterone Apply 1 pump under each armpit every night   azelastine 0.1 % nasal spray Commonly known as:  ASTELIN Place 2 sprays into both nostrils 2 (two) times daily. Use in each nostril as directed   celecoxib 200 MG capsule Commonly known as:  CELEBREX Take 200 mg by mouth 2 (two) times daily.   CENTRUM SILVER PO Take 1 tablet by mouth every evening.   cetirizine 10 MG tablet Commonly known as:  ZYRTEC Take 10 mg by mouth daily.   clobetasol cream 0.05 % Commonly known as:  TEMOVATE Apply topically as needed. Reported on 12/03/2015   clonazePAM 2 MG tablet Commonly known as:  KLONOPIN Take 1 tablet (2 mg total) by mouth 3 (three) times daily as needed (severe muscle spasm).   colestipol 1 g tablet Commonly known as:  COLESTID Take 3 tablets (3 g total) by mouth 2 (two) times daily.   esomeprazole 40 MG capsule Commonly known as:  NEXIUM TAKE 1 CAPSULE DAILY   ezetimibe 10 MG tablet Commonly known as:  ZETIA TAKE 1 TABLET DAILY   finasteride 5 MG tablet Commonly known as:  PROSCAR Take 1 tablet (5 mg total) by mouth daily.   fluticasone 50 MCG/ACT nasal spray Commonly known as:  FLONASE Place into both nostrils daily as needed for allergies  or rhinitis.   GARLIC OIL PO Take by mouth.   levothyroxine 75 MCG tablet Commonly known as:  SYNTHROID, LEVOTHROID Take 1 tablet (75 mcg total) by mouth daily.   Plant Sterols and Stanols 450 MG Caps Take by mouth 2 (two) times daily.   RESVERATROL PO Take 1,000 mg by mouth 2 (two) times daily.       Allergies:  Allergies  Allergen Reactions  . Duloxetine     Unable to urinate  . Nsaids Other (See Comments)    CANNOT TAKE NSAIDS BECAUSE OF ESOPHAGUS ISSUES CANNOT TAKE NSAIDS BECAUSE OF ESOPHAGUS ISSUES  . Rosuvastatin Other (See Comments)    SEVERE MUSCLE ACHES SEVERE MUSCLE ACHES  . Statins Other (See Comments)    SEVERE MUSCLE ACHES SEVERE MUSCLE ACHES Other reaction(s): Other (See Comments) SEVERE MUSCLE ACHES  . Tolmetin Other (See Comments)    CANNOT TAKE  NSAIDS BECAUSE OF ESOPHAGUS ISSUES CANNOT TAKE NSAIDS BECAUSE OF ESOPHAGUS ISSUES CANNOT TAKE NSAIDS BECAUSE OF ESOPHAGUS ISSUES CANNOT TAKE NSAIDS BECAUSE OF ESOPHAGUS ISSUES    Family History: Family History  Problem Relation Age of Onset  . Heart failure Mother   . Heart failure Father   . Emphysema Father        smoked  . Cancer Sister   . Aneurysm Sister        Brain    Social History:  reports that he quit smoking about 14 years ago. His smoking use included cigarettes. He has a 30.00 pack-year smoking history. He has never used smokeless tobacco. He reports that he does not drink alcohol or use drugs.  ROS: UROLOGY Frequent Urination?: No Hard to postpone urination?: No Burning/pain with urination?: No Get up at night to urinate?: No Leakage of urine?: No Urine stream starts and stops?: No Trouble starting stream?: No Do you have to strain to urinate?: No Blood in urine?: No Urinary tract infection?: No Sexually transmitted disease?: No Injury to kidneys or bladder?: No Painful intercourse?: No Weak stream?: No Erection problems?: No Penile pain?: No  Gastrointestinal Nausea?:  No Vomiting?: No Indigestion/heartburn?: No Diarrhea?: No Constipation?: No  Constitutional Fever: No Night sweats?: No Weight loss?: No Fatigue?: No  Skin Skin rash/lesions?: No Itching?: No  Eyes Blurred vision?: No Double vision?: No  Ears/Nose/Throat Sore throat?: No Sinus problems?: Yes  Hematologic/Lymphatic Swollen glands?: No Easy bruising?: No  Cardiovascular Leg swelling?: No Chest pain?: No  Respiratory Cough?: No Shortness of breath?: No  Endocrine Excessive thirst?: No  Musculoskeletal Back pain?: No Joint pain?: No  Neurological Headaches?: No Dizziness?: No  Psychologic Depression?: No Anxiety?: No  Physical Exam: BP 114/73 (BP Location: Left Arm, Patient Position: Sitting, Cuff Size: Normal)   Pulse 91   Ht 6' (1.829 m)   Wt 174 lb 14.4 oz (79.3 kg)   BMI 23.72 kg/m   Constitutional:  Alert and oriented, No acute distress. HEENT: [place] AT, moist mucus membranes.  Trachea midline, no masses. Cardiovascular: No clubbing, cyanosis, or edema. Respiratory: Normal respiratory effort, no increased work of breathing. GI: Abdomen is soft, nontender, nondistended, no abdominal masses GU: No CVA tenderness.  Prostate 35 g, smooth without nodules Lymph: No cervical or inguinal lymphadenopathy. Skin: No rashes, bruises or suspicious lesions. Neurologic: Grossly intact, no focal deficits, moving all 4 extremities. Psychiatric: Normal mood and affect.   Assessment & Plan:   78 year old male with a history of high-grade PIN and atypical small acinar proliferation on prostate biopsy in 2011.  He was not rebiopsied however his PSA has remained stable.  If he does have an increase in his PSA would recommend a prostate MRI.  PSA was drawn today and if stable follow-up PSA in 6 months and DRE in 1 year.  He has stable symptoms on TRT.  Testosterone and hematocrit were ordered.   Abbie Sons, [place] [address] [place], [place] [address] [phone]

## 2018-07-20 NOTE — Patient Instructions (Signed)
Prostate Cancer  The prostate is a walnut-sized gland that is involved in the production of semen. It is located below a man's bladder, in front of the rectum. Prostate cancer is the abnormal growth of cells in the prostate gland. What are the causes? The exact cause of this condition is not known. What increases the risk? This condition is more likely to develop in men who:  Are older than age 76.  Are African-American.  Are obese.  Have a family history of prostate cancer.  Have a family history of breast cancer. What are the signs or symptoms? Symptoms of this condition include:  A need to urinate often.  Weak or interrupted flow of urine.  Trouble starting or stopping urination.  Inability to urinate.  Pain or burning during urination.  Painful ejaculation.  Blood in urine or semen.  Persistent pain or discomfort in the lower back, lower abdomen, hips, or upper thighs.  Trouble getting an erection.  Trouble emptying the bladder all the way. How is this diagnosed? This condition can be diagnosed with:  A digital rectal exam. For this exam, a health care provider inserts a gloved finger into the rectum to feel the prostate gland.  A blood test called a prostate-specific antigen (PSA) test.  An imaging test called transrectal ultrasonography.  A procedure in which a sample of tissue is taken from the prostate and examined under a microscope (prostate biopsy). Once the condition is diagnosed, tests will be done to determine how far the cancer has spread. This is called staging the cancer. Staging may involve imaging tests, such as:  A bone scan.  A CT scan.  A PET scan.  An MRI. The stages of prostate cancer are as follows:  Stage I. At this stage, the cancer is found in the prostate only. The cancer is not visible on imaging tests and it is usually found by accident, such as during a prostate surgery.  Stage II. At this stage, the cancer is more advanced  than it is in stage I, but the cancer has not spread outside the prostate.  Stage III. At this stage, the cancer has spread beyond the outer layer of the prostate to nearby tissues. The cancer may be found in the seminal vesicles, which are near the bladder and the prostate.  Stage IV. At this stage, the cancer has spread other parts of the body, such as the lymph nodes, bones, bladder, rectum, liver, or lungs. How is this treated? Treatment for this condition depends on several factors, including the stage of the cancer, your age, personal preferences, and your overall health. Talk with your health care provider about treatment options that are recommended for you. Common treatments include:  Observation for early stage prostate cancer (active surveillance). This involves having exams, blood tests, and in some cases, more biopsies. For some men, this is the only treatment needed.  Surgery. Types of surgeries include: ? Open surgery. In this surgery, a larger incision is made to remove the prostate. ? A laparoscopic prostatectomy. This is a surgery to remove the prostate and lymph nodes through several, small incisions. It is often referred to as a minimally invasive surgery. ? A robotic prostatectomy. This is a surgery to remove the prostate and lymph nodes with the help of a robotic arm that is controlled by a computer. ? Orchiectomy. This is a surgery to remove the testicles. ? Cryosurgery. This is a surgery to freeze and destroy cancer cells.  Radiation treatment. Types   of radiation treatment include: ? External beam radiation. This type aims beams of radiation from outside the body at the prostate to destroy cancerous cells. ? Brachytherapy. This type uses radioactive needles, seeds, wires, or tubes that are implanted into the prostate gland. Like external beam radiation, brachytherapy destroys cancerous cells. An advantage is that this type of radiation limits the damage to surrounding  tissue and has fewer side effects.  High-intensity, focused ultrasonography. This treatment destroys cancer cells by delivering high-energy ultrasound waves to the cancerous cells.  Chemotherapy medicines. This treatment kills cancer cells or stops them from multiplying.  Hormone treatment. This treatment involves taking medicines that act on one of the male hormones (testosterone): ? By stopping your body from producing testosterone. ? By blocking testosterone from reaching cancer cells. Follow these instructions at home:  Take over-the-counter and prescription medicines only as told by your health care provider.  Maintain a healthy diet.  Get plenty of sleep.  Consider joining a support group for men who have prostate cancer. Meeting with a support group may help you learn to cope with the stress of having cancer.  Keep all follow-up visits as told by your health care provider. This is important.  If you have to go to the hospital, notify your cancer specialist (oncologist).  Treatment for prostate cancer may affect sexual function. Continue to have intimate moments with your partner. This may include touching, holding, hugging, and caressing. Contact a health care provider if:  You have trouble urinating.  You have blood in your urine.  You have pain in your hips, back, or chest. Get help right away if:  You have weakness or numbness in your legs.  You cannot control urination or your bowel movements (incontinence).  You have trouble breathing.  You have sudden chest pain.  You have chills or a fever. Summary  The prostate is a walnut-sized gland that is involved in the production of semen. It is located below a man's bladder, in front of the rectum. Prostate cancer is the abnormal growth of cells in the prostate gland.  Treatment for this condition depends on several factors, including the stage of the cancer, your age, personal preferences, and your overall health.  Talk with your health care provider about treatment options that are recommended for you.  Consider joining a support group for men who have prostate cancer. Meeting with a support group may help you learn to cope with the stress of having cancer. This information is not intended to replace advice given to you by your health care provider. Make sure you discuss any questions you have with your health care provider. Document Released: 06/08/2005 Document Revised: 02/10/2017 Document Reviewed: 02/17/2016 Elsevier Interactive Patient Education  2019 [employer]  

## 2018-07-21 ENCOUNTER — Encounter: Payer: Self-pay | Admitting: Family Medicine

## 2018-07-21 ENCOUNTER — Telehealth: Payer: Self-pay | Admitting: Family Medicine

## 2018-07-21 DIAGNOSIS — E041 Nontoxic single thyroid nodule: Secondary | ICD-10-CM | POA: Insufficient documentation

## 2018-07-21 LAB — PSA: Prostate Specific Ag, Serum: 1.9 ng/mL (ref 0.0–4.0)

## 2018-07-21 LAB — TESTOSTERONE: TESTOSTERONE: 498 ng/dL (ref 264–916)

## 2018-07-21 LAB — HEMATOCRIT: HEMATOCRIT: 50.2 % (ref 37.5–51.0)

## 2018-07-21 NOTE — Telephone Encounter (Signed)
Please let the patient know I heard back from his pulmonologist regarding recommendations for anticoagulation.  Please find out if the patient is fully ambulatory meaning is he up moving around constantly throughout the day or is he sitting around a lot.  His pulmonologist mentioned possibly transitioning him off of Eliquis if he is fully ambulatory though also mentioned possibly having him see hematology for a second opinion given it is somewhat unclear if his DVT and PE were related to how sedentary he was previously with his injury.

## 2018-07-21 NOTE — Telephone Encounter (Signed)
-----   Message from Timothy Rockers, MD sent at 07/21/2018 10:59 AM EST ----- Regarding: RE: anticoagulation question Looks good but issues why he got the clot in the first place and I believe it was probably related to how sedentary he was/is.  If he is fully ambulatory now I'd d/c the doac and just use baby asa, otherwise would maintain him on the half dose = prophylaxis, which is approved at 2.5 bid- other option is see hematology for 2nd opinion which I usually do in iffy cases like this, depending on pt preference of course.    Good luck - happy to see again anytime.   Timothy Turner  ----- Message ----- From: Timothy Haven, MD Sent: 07/21/2018  10:10 AM EST To: Timothy Rockers, MD Subject: anticoagulation question                       Hi Dr Melvyn Novas,   We finally obtained the follow-up imaging on this patient that we discussed previously. I have included the impressions below. The CTA appears clear of PE. The echo appears stable from his prior echo. His [place] did not reveal a DVT though had limited evaluation of his peroneal veins which is where he had the DVT on the left. My specific question at this time is regarding the [place] and whether or not the limited evaluation of the peroneal veins would preclude [place] taking him off of the eliquis. I would think with his duration of treatment this would be resolved at this time. Thanks for your help.  Timothy Turner   His CTA impression is as follows:  IMPRESSION: 1. No evidence of acute or chronic pulmonary embolism with special attention paid to the lingular pulmonary arterial branches. 2. No CT evidence of pulmonary arterial hypertension. Specifically, normal caliber of the main pulmonary artery. 3. Borderline cardiomegaly. 4. Coronary artery calcifications. Aortic Atherosclerosis (ICD10-I70.0). 5.  Emphysema (ICD10-J43.9). 6. Prominent mediastinal and hilar lymph nodes are unchanged compared to the 10/2017 examination, and while nonspecific are presumably  reactive given background emphysematous change and chronic branching nodular opacities within the left lower lobe favored to represent the sequela of previous aspiration event given provided clinical history of neuromuscular disorder requiring aspiration precautions.   His echo impression is as follows:   Study Conclusions - Left ventricle: The cavity size was normal. Systolic function was   normal. The estimated ejection fraction was in the range of 60%   to 65%. Wall motion was normal; there were no regional wall   motion abnormalities. Doppler parameters are consistent with   abnormal left ventricular relaxation (grade 1 diastolic   dysfunction). - Aortic valve: There was very mild stenosis. - Mitral valve: Calcified annulus. There was mild regurgitation. - Left atrium: The atrium was mildly dilated. - Right ventricle: Systolic function was normal. - Pulmonary arteries: Systolic pressure was within the normal   range.   His bilateral LE US impression is as follows:  IMPRESSION: No evidence of deep venous thrombosis in the lower extremities. Limited evaluation of the bilateral peroneal veins but no evidence for acute thrombus.

## 2018-07-22 ENCOUNTER — Encounter: Payer: Self-pay | Admitting: Urology

## 2018-07-22 ENCOUNTER — Telehealth: Payer: Self-pay | Admitting: Family Medicine

## 2018-07-22 NOTE — Telephone Encounter (Signed)
Patient says it hurst to move around but he is up and ambulating daily, doing ADLs.

## 2018-07-22 NOTE — Telephone Encounter (Signed)
Patient notified and voiced understanding.

## 2018-07-22 NOTE — Telephone Encounter (Signed)
-----   Message from Abbie Sons, MD sent at 07/22/2018  8:46 AM EST ----- Testosterone level looks good at 498.  PSA stable at 1.9.  Hematocrit was 50.  Recommend keep scheduled follow-up blood work 6 months

## 2018-07-25 NOTE — Telephone Encounter (Signed)
Please let the patient know that I think it might be worthwhile having him see hematology for a second opinion on whether or not he should continue on the Eliquis. This was an option put forth by his pulmonologist given it is somewhat unclear that his DVT and PE were related to him being sedentary prior to their occurrence. If he is willing to see hematology I can place a referral. Thanks.

## 2018-07-26 ENCOUNTER — Telehealth: Payer: Self-pay | Admitting: Family Medicine

## 2018-07-26 DIAGNOSIS — I2699 Other pulmonary embolism without acute cor pulmonale: Secondary | ICD-10-CM

## 2018-07-26 NOTE — Telephone Encounter (Signed)
Noted  Referral placed.

## 2018-07-26 NOTE — Telephone Encounter (Signed)
Called patient left voicemail to call office, [facility] nurse may advise patient.

## 2018-07-26 NOTE — Telephone Encounter (Signed)
Pt returned call. Message from Dr Caryl Bis  Read to patient. Pt verbalized understanding and agrees to referral to hematology.   Please let the patient know that I think it might be worthwhile having him see hematology for a second opinion on whether or not he should continue on the Eliquis. This was an option put forth by his pulmonologist given it is somewhat unclear that his DVT and PE were related to him being sedentary prior to their occurrence. If he is willing to see hematology I can place a referral. Thanks.

## 2018-07-26 NOTE — Addendum Note (Signed)
Addended by: Caryl Bis, Maylynn Orzechowski G on: 07/26/2018 01:09 PM   Modules accepted: Orders

## 2018-08-01 ENCOUNTER — Encounter: Payer: Self-pay | Admitting: Oncology

## 2018-08-01 ENCOUNTER — Other Ambulatory Visit: Payer: Self-pay

## 2018-08-01 ENCOUNTER — Inpatient Hospital Stay: Payer: Medicare Other

## 2018-08-01 ENCOUNTER — Inpatient Hospital Stay: Payer: Medicare Other | Attending: Oncology | Admitting: Oncology

## 2018-08-01 VITALS — BP 124/84 | HR 73 | Temp 96.8°F | Wt 177.1 lb

## 2018-08-01 DIAGNOSIS — Z791 Long term (current) use of non-steroidal anti-inflammatories (NSAID): Secondary | ICD-10-CM

## 2018-08-01 DIAGNOSIS — Z79890 Hormone replacement therapy (postmenopausal): Secondary | ICD-10-CM

## 2018-08-01 DIAGNOSIS — Z79899 Other long term (current) drug therapy: Secondary | ICD-10-CM | POA: Insufficient documentation

## 2018-08-01 DIAGNOSIS — Z87891 Personal history of nicotine dependence: Secondary | ICD-10-CM | POA: Diagnosis not present

## 2018-08-01 DIAGNOSIS — Z86711 Personal history of pulmonary embolism: Secondary | ICD-10-CM | POA: Diagnosis present

## 2018-08-01 DIAGNOSIS — E291 Testicular hypofunction: Secondary | ICD-10-CM | POA: Insufficient documentation

## 2018-08-01 DIAGNOSIS — I2609 Other pulmonary embolism with acute cor pulmonale: Secondary | ICD-10-CM

## 2018-08-01 DIAGNOSIS — Z86718 Personal history of other venous thrombosis and embolism: Secondary | ICD-10-CM

## 2018-08-01 LAB — CBC WITH DIFFERENTIAL/PLATELET
ABS IMMATURE GRANULOCYTES: 0.01 10*3/uL (ref 0.00–0.07)
BASOS ABS: 0.1 10*3/uL (ref 0.0–0.1)
BASOS PCT: 1 %
EOS ABS: 0.2 10*3/uL (ref 0.0–0.5)
EOS PCT: 3 %
HEMATOCRIT: 47.8 % (ref 39.0–52.0)
HEMOGLOBIN: 15.4 g/dL (ref 13.0–17.0)
Immature Granulocytes: 0 %
Lymphocytes Relative: 34 %
Lymphs Abs: 2.5 10*3/uL (ref 0.7–4.0)
MCH: 28.7 pg (ref 26.0–34.0)
MCHC: 32.2 g/dL (ref 30.0–36.0)
MCV: 89 fL (ref 80.0–100.0)
Monocytes Absolute: 0.8 10*3/uL (ref 0.1–1.0)
Monocytes Relative: 11 %
NEUTROS PCT: 51 %
NRBC: 0 % (ref 0.0–0.2)
Neutro Abs: 3.6 10*3/uL (ref 1.7–7.7)
Platelets: 232 10*3/uL (ref 150–400)
RBC: 5.37 MIL/uL (ref 4.22–5.81)
RDW: 13.5 % (ref 11.5–15.5)
WBC: 7.2 10*3/uL (ref 4.0–10.5)

## 2018-08-03 NOTE — Progress Notes (Signed)
Hematology/Oncology Consult note [facility] Telephone:([phone] [phone]   Patient Care Team: Leone Haven, MD as PCP - General (Family Medicine) Bary Castilla, Forest Gleason, MD (General Surgery) Jackolyn Confer, MD (Internal Medicine)  REFERRING PROVIDER: Leone Haven, MD  CHIEF COMPLAINTS/REASON FOR VISIT:  Evaluation of pulmonary embolism.   HISTORY OF PRESENTING ILLNESS:  Timothy Turner is a  78 y.o.  male with PMH listed below who was referred to me for evaluation of pulmonary embolism.  11/11/2017 patient was diagnosed with acute pulmonary embolism. Patient's presenting symptoms included: Pleuritic chest pain, shortness of breath. CT angio chest showed positive acute PE within the lingula with CT evidence of right heart strain consistent with at least submassive PE.  Lingula and the left lower lobe airspace disease consistent with pneumonia with reactive left lower paratracheal adenopathy.  Bilateral sub-centimeters hypodensities within both kidneys statistically consistent with a cyst. Was treated with heparin drip during his hospitalization and switched to Eliquis. Tolerates chronic anticoagulation well.  Denies any bleeding events. US venous lower extremity showed acute left lower extremity DVT. About 4 to 6 weeks prior to he developed acute PE and lower extremity DVT, he lost balance and fell and hurt his knee.  He has been a active for about 2 to 3 weeks. No previous history of other episodes of thrombosis.  07/06/2018 patient had a repeat CT angios chest PE which showed no evidence of acute or chronic pulmonary embolism with special attention paid to the lingula pulmonary arterial branches.  No CT evidence of pulmonary hypertension Emphysema.  Prominent mediastinal and hilar lymph nodes are unchanged. 07/18/2018 bilateral lower extremity US venous showed no evidence of deep vein thrombosis in the lower extremities.  Patient  reports feeling well.  He remains active.  Denies any shortness of breath, chest pain, lower extremity swelling. Denies any family history of thrombosis.  Former smoker.  30-pack-year smoking history, quit in 2006. Denies any unintentional weight loss, night sweating fever or chills. Age appropriate cancer screening: Most recent colonoscopy and upper endoscopy 06/17/2017. Barrett esophagus    Review of Systems  Constitutional: Negative for appetite change, chills, fatigue, fever and unexpected weight change.  HENT:   Negative for hearing loss and voice change.   Eyes: Negative for eye problems and icterus.  Respiratory: Negative for chest tightness, cough and shortness of breath.   Cardiovascular: Negative for chest pain and leg swelling.  Gastrointestinal: Negative for abdominal distention and abdominal pain.  Endocrine: Negative for hot flashes.  Genitourinary: Negative for difficulty urinating, dysuria and frequency.   Musculoskeletal: Negative for arthralgias.  Skin: Negative for itching and rash.  Neurological: Negative for light-headedness and numbness.  Hematological: Negative for adenopathy. Does not bruise/bleed easily.  Psychiatric/Behavioral: Negative for confusion.    MEDICAL HISTORY:  Past Medical History:  Diagnosis Date  . Aspiration precautions    uses thicken in liquids.  Eats pureed food.  . Barrett esophagus   . Depression   . Gallstones   . GERD (gastroesophageal reflux disease)   . Heart murmur    History of  . Hemorrhoids   . Hypertension   . Hypothyroidism   . Iron deficiency anemia 02/10/2012  . Mitral valve disease   . Myalgia    Multiple  . Neuromuscular disorder ([place])   . PAD (peripheral artery disease) (HCC)    lower legs  . Pain syndrome, chronic    Severe  . Pneumonia    three times, last time >10  years ago  . Psoriasis   . Rheumatic heart valve incompetence   . Shortness of breath dyspnea   . Thyroiditis     SURGICAL HISTORY: Past  Surgical History:  Procedure Laterality Date  . BACK SURGERY    . CARDIAC CATHETERIZATION N/A 02/22/2015   Procedure: Left Heart Cath and Coronary Angiography;  Surgeon: Minna Merritts, MD;  Location: [place] [facility];  Service: Cardiovascular;  Laterality: N/A;  . CATARACT EXTRACTION W/PHACO Left 07/29/2016   Procedure: CATARACT EXTRACTION PHACO AND INTRAOCULAR LENS PLACEMENT (I[facility])  Left eye IVA Topical;  Surgeon: Leandrew Koyanagi, MD;  Location: [place];  Service: Ophthalmology;  Laterality: Left;  . CATARACT EXTRACTION W/PHACO Right 08/19/2016   Procedure: CATARACT EXTRACTION PHACO AND INTRAOCULAR LENS PLACEMENT ([place]) right  TORIC;  Surgeon: Leandrew Koyanagi, MD;  Location: [place];  Service: Ophthalmology;  Laterality: Right;  prefers early morning  . CHOLECYSTECTOMY  08/03/13  . COLONOSCOPY  03-08-12   Dr Donnella Sham  . COLONOSCOPY WITH PROPOFOL N/A 06/17/2017   Procedure: COLONOSCOPY WITH PROPOFOL;  Surgeon: [name], MD;  Location: [facility] END[facility]COPY;  Service: Gastroenterology;  Laterality: N/A;  . DIRECT LARYNGOSCOPY N/A 02/17/2016   Procedure: DIRECT LARYNGOSCOPY;  Surgeon: Jodi Marble, MD;  Location: [place];  Service: ENT;  Laterality: N/A;  . ESOPHAGEAL MANOMETRY N/A 02/27/2015   Procedure: ESOPHAGEAL MANOMETRY (EM);  Surgeon: Lollie Sails, MD;  Location: [facility] ENDOSCOPY;  Service: Endoscopy;  Laterality: N/A;  . ESOPHAGOGASTRODUODENOSCOPY (EGD) WITH PROPOFOL N/A 02/18/2015   Procedure: ESOPHAGOGASTRODUODENOSCOPY (EGD) WITH PROPOFOL;  Surgeon: Lollie Sails, MD;  Location[facility];  Service: Endoscopy;  Laterality: N/A;  . ESOPHAGOGASTRODUODENOSCOPY (EGD) WITH PROPOFOL N/A 06/17/2017   Procedure: ESOPHAGOGASTRODUODENOSCOPY (EGD) WITH PROPOFOL;  Surgeon: [name], MD;  Location: [facility] ENDOSCOPY;  Service: Gastroenterology;  Laterality: N/A;  . ESOPHAGOSCOPY W/ BOTOX INJECTION N/A 02/17/2016   Procedure: ESOPHAGOSCOPY WITH  BOTOX INJECTION  ;  Surgeon: Jodi Marble, MD;  Location: [place];  Service: ENT;  Laterality: N/A;  . HEMORROIDECTOMY    . UPPER GI ENDOSCOPY  03-06-2013   Dr Donnella Sham    SOCIAL HISTORY: Social History   Socioeconomic History  . Marital status: Married    Spouse name: Not on file  . Number of children: 2  . Years of education: Not on file  . Highest education level: Not on file  Occupational History  . 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 Occupation: Disabled  Social Needs  . Financial resource strain: Not hard at all  . Food insecurity:    Worry: Never true    Inability: Never true  . Transportation needs:    Medical: No    Non-medical: No  Tobacco Use  . Smoking status: Former Smoker    Packs/day: 1.00    Years: 30.00    Pack years: 30.00    Types: Cigarettes    Last attempt to quit: 06/22/2004    Years since quitting: 14.1  . Smokeless tobacco: Never Used  Substance and Sexual Activity  . Alcohol use: No    Alcohol/week: 0.0 standard drinks  . Drug use: No  . Sexual activity: Yes  Lifestyle  . Physical activity:    Days per week: Not on file    Minutes per session: Not on file  . Stress: Not on file  Relationships  . Social connections:    Talks on phone: Not on file    Gets  together: Not on file    Attends religious service: Not on file    Active member of club or organization: Not on file    Attends meetings of clubs or organizations: Not on file    Relationship status: Not on file  . Intimate partner violence:    Fear of current or ex partner: Not on file    Emotionally abused: Not on file    Physically abused: Not on file    Forced sexual activity: Not on file  Other Topics Concern  . Not on file  Social History Narrative   Drinks 4-5 cups of coffee a day and 3-4 cans of soda a day.    FAMILY HISTORY: Family History  Problem Relation Age of Onset  . Heart failure Mother   . Heart failure Father     . Emphysema Father        smoked  . Cancer Sister   . Aneurysm Sister        Brain    ALLERGIES:  is allergic to duloxetine; nsaids; rosuvastatin; statins; and tolmetin.  MEDICATIONS:  Current Outpatient Medications  Medication Sig Dispense Refill  . apixaban (ELIQUIS) 5 MG TABS tablet Take 1 tablet (5 mg total) by mouth 2 (two) times daily. 180 tablet 1  . azelastine (ASTELIN) 0.1 % nasal spray Place 2 sprays into both nostrils 2 (two) times daily. Use in each nostril as directed 30 mL 6  . celecoxib (CELEBREX) 200 MG capsule Take 200 mg by mouth 2 (two) times daily.    . cetirizine (ZYRTEC) 10 MG tablet Take 10 mg by mouth daily.    . clobetasol (TEMOVATE) 0.05 % cream Apply topically as needed. Reported on 12/03/2015    . clonazePAM (KLONOPIN) 2 MG tablet Take 1 tablet (2 mg total) by mouth 3 (three) times daily as needed (severe muscle spasm). 90 tablet 1  . colestipol (COLESTID) 1 G tablet Take 3 tablets (3 g total) by mouth 2 (two) times daily. 540 tablet 3  . esomeprazole (NEXIUM) 40 MG capsule TAKE 1 CAPSULE DAILY 90 capsule 3  . ezetimibe (ZETIA) 10 MG tablet TAKE 1 TABLET DAILY 90 tablet 4  . finasteride (PROSCAR) 5 MG tablet Take 1 tablet (5 mg total) by mouth daily. 90 tablet 3  . fluticasone (FLONASE) 50 MCG/ACT nasal spray Place into both nostrils daily as needed for allergies or rhinitis.    [name] GARLIC OIL PO Take by mouth.    . levothyroxine (SYNTHROID, LEVOTHROID) 75 MCG tablet Take 1 tablet (75 mcg total) by mouth daily. 90 tablet 1  . Multiple Vitamins-Minerals (CENTRUM SILVER PO) Take 1 tablet by mouth every evening.     . Plant Sterols and Stanols 450 MG CAPS Take by mouth 2 (two) times daily.    [name] RESVERATROL PO Take 1,000 mg by mouth 2 (two) times daily.    . Testosterone (AXIRON) 30 MG/ACT SOLN Apply 1 pump under each armpit every night     No current facility-administered medications for this visit.      PHYSICAL EXAMINATION: ECOG PERFORMANCE STATUS: 0 -  Asymptomatic Vitals:   08/01/18 1004  BP: 124/84  Pulse: 73  Temp: (!) 96.8 F (36 C)   Filed Weights   08/01/18 1004  Weight: 177 lb 1.6 oz (80.3 kg)    Physical Exam Constitutional:      General: He is not in acute distress. HENT:     Head: Normocephalic and atraumatic.  Eyes:  General: No scleral icterus.    Pupils: Pupils are equal, round, and reactive to light.  Neck:     Musculoskeletal: Normal range of motion and neck supple.  Cardiovascular:     Rate and Rhythm: Normal rate and regular rhythm.     Heart sounds: Normal heart sounds.  Pulmonary:     Effort: Pulmonary effort is normal. No respiratory distress.     Breath sounds: No wheezing.  Abdominal:     General: Bowel sounds are normal. There is no distension.     Palpations: Abdomen is soft. There is no mass.     Tenderness: There is no abdominal tenderness.  Musculoskeletal: Normal range of motion.        General: No deformity.  Skin:    General: Skin is warm and dry.     Findings: No erythema or rash.  Neurological:     Mental Status: He is alert and oriented to person, place, and time.     Cranial Nerves: No cranial nerve deficit.     Coordination: Coordination normal.  Psychiatric:        Behavior: Behavior normal.        Thought Content: Thought content normal.      LABORATORY DATA:  I have reviewed the data as listed Lab Results  Component Value Date   WBC 7.2 08/01/2018   HGB 15.4 08/01/2018   HCT 47.8 08/01/2018   MCV 89.0 08/01/2018   PLT 232 08/01/2018   Recent Labs    11/11/17 1120 11/11/17 1804 11/12/17 0358 11/26/17 0800 06/24/18 0832  NA 138 139 139 141 140  K 4.4 4.4 3.8 4.3 4.3  CL 102 101 102 104 105  CO2 30 28 28 28 25   GLUCOSE 155* 123* 105* 133* 99  BUN 22 21* 19 21 15   CREATININE 1.54* 1.42* 1.54* 1.52* 1.15  CALCIUM 9.9 9.5 9.0 9.3 9.3  GFRNONAA  --  47* 43*  --   --   GFRAA  --  55* 50*  --   --   PROT 7.0 7.2  --   --  6.5  ALBUMIN 4.1 4.2  --   --  4.0    AST 20 27  --   --  23  ALT 15 19  --   --  18  ALKPHOS 69 69  --   --  75  BILITOT 0.6 0.5  --   --  0.5   Iron/TIBC/Ferritin/ %Sat    Component Value Date/Time   FERRITIN 46.8 06/07/2012 0838        ASSESSMENT & PLAN:  1. History of pulmonary embolism   2. History of deep vein thrombosis (DVT) of lower extremity   3. Long-term current use of testosterone replacement therapy    Discussed with patient that with the preceding history of left lower extremity trauma/injury, patient being temporarily immobilized for a few weeks prior to the onset of acute PE and left lower extremity DVT, his thrombosis event most likely is provoked. Therefore I recommend him to finish total 6 months of anticoagulation which he has finished already. Repeat CT angiogram and lower extremity US venous negative for any acute or chronic thrombosis. Discussed with patient that he can discontinue anticoagulation at this point. Patient is made aware that chances of another thrombosis is higher than general population given his history of thrombosis and male gender. Patient is aware about thrombosis symptoms.  Advised patient to discuss with gastroenterology to see if aspirin 81 mg  is okay to be started.  Hypogonadism with long-term use of testosterone.  No erythrocytosis. Discussed with patient that testosterone replacement therapy is related to increased thrombosis.   Patient tells me that he is going to stop using testosterone as it has not helped with his hypogonadism symptoms anyhow.  Orders Placed This Encounter  Procedures  . CBC with Differential/Platelet    Standing Status:   Future    Standing Expiration Date:   08/01/2019  . CBC with Differential/Platelet    Standing Status:   Future    Number of Occurrences:   1    Standing Expiration Date:   08/02/2019    All questions were answered. The patient knows to call the clinic with any problems questions or concerns.  Return of visit: As  needed. Thank you for this kind referral and the opportunity to participate in the care of this patient. A copy of today's note is routed to referring provider  Total face to face encounter time for this patient visit was 45 min. >50% of the time was  spent in counseling and coordination of care.    Earlie Server, MD, PhD Hematology Oncology [facility] at [facility] Pager- [phone] 08/03/2018

## 2018-08-30 ENCOUNTER — Encounter: Payer: Self-pay | Admitting: Family Medicine

## 2018-08-30 ENCOUNTER — Ambulatory Visit (INDEPENDENT_AMBULATORY_CARE_PROVIDER_SITE_OTHER): Payer: Medicare Other | Admitting: Family Medicine

## 2018-08-30 ENCOUNTER — Ambulatory Visit (INDEPENDENT_AMBULATORY_CARE_PROVIDER_SITE_OTHER): Payer: Medicare Other

## 2018-08-30 VITALS — BP 98/70 | HR 71 | Temp 97.9°F | Resp 15 | Ht 72.0 in | Wt 172.8 lb

## 2018-08-30 VITALS — BP 98/70 | HR 71 | Temp 97.9°F | Ht 72.0 in | Wt 172.8 lb

## 2018-08-30 DIAGNOSIS — M791 Myalgia, unspecified site: Secondary | ICD-10-CM

## 2018-08-30 DIAGNOSIS — I739 Peripheral vascular disease, unspecified: Secondary | ICD-10-CM

## 2018-08-30 DIAGNOSIS — E7849 Other hyperlipidemia: Secondary | ICD-10-CM | POA: Diagnosis not present

## 2018-08-30 DIAGNOSIS — Z0000 Encounter for general adult medical examination without abnormal findings: Secondary | ICD-10-CM | POA: Diagnosis not present

## 2018-08-30 DIAGNOSIS — J069 Acute upper respiratory infection, unspecified: Secondary | ICD-10-CM

## 2018-08-30 DIAGNOSIS — M79641 Pain in right hand: Secondary | ICD-10-CM | POA: Diagnosis not present

## 2018-08-30 DIAGNOSIS — M79642 Pain in left hand: Secondary | ICD-10-CM

## 2018-08-30 DIAGNOSIS — I2699 Other pulmonary embolism without acute cor pulmonale: Secondary | ICD-10-CM

## 2018-08-30 LAB — COMPREHENSIVE METABOLIC PANEL
ALT: 12 U/L (ref 0–53)
AST: 20 U/L (ref 0–37)
Albumin: 4.3 g/dL (ref 3.5–5.2)
Alkaline Phosphatase: 83 U/L (ref 39–117)
BUN: 9 mg/dL (ref 6–23)
CHLORIDE: 105 meq/L (ref 96–112)
CO2: 27 mEq/L (ref 19–32)
CREATININE: 1.31 mg/dL (ref 0.40–1.50)
Calcium: 9.9 mg/dL (ref 8.4–10.5)
GFR: 53.29 mL/min — ABNORMAL LOW (ref >60.00)
GLUCOSE: 109 mg/dL — AB (ref 70–99)
Potassium: 4.5 mEq/L (ref 3.5–5.1)
SODIUM: 140 meq/L (ref 135–145)
TOTAL PROTEIN: 6.6 g/dL (ref 6.0–8.3)
Total Bilirubin: 0.7 mg/dL (ref 0.2–1.2)

## 2018-08-30 LAB — LIPID PANEL
CHOL/HDL RATIO: 4
CHOLESTEROL: 204 mg/dL — AB (ref 0–200)
HDL: 48.4 mg/dL (ref >39.00)
LDL CALC: 135 mg/dL — AB (ref 0–99)
NONHDL: 155.15
Triglycerides: 99 mg/dL (ref 0.0–149.0)
VLDL: 19.8 mg/dL (ref 0.0–40.0)

## 2018-08-30 MED ORDER — CLONAZEPAM 2 MG PO TABS: 1.0000 mg | ORAL_TABLET | Freq: Two times a day (BID) | ORAL | 1 refills | Status: DC | PRN

## 2018-08-30 NOTE — Progress Notes (Signed)
Subjective:   Timothy Turner is a 78 y.o. male who presents for Medicare Annual/Subsequent preventive examination.  Review of Systems:  No ROS.  Medicare Wellness Visit. Additional risk factors are reflected in the social history. Cardiac Risk Factors include: advanced age (>26men, >77 women);male gender     Objective:    Vitals: BP 98/70 (BP Location: Right Arm, Patient Position: Sitting, Cuff Size: Normal)   Pulse 71   Temp 97.9 F (36.6 C) (Oral)   Resp 15   Ht 6' (1.829 m)   Wt 172 lb 12.8 oz (78.4 kg)   SpO2 95%   BMI 23.44 kg/m   Body mass index is 23.44 kg/m.  Advanced Directives 08/30/2018 08/01/2018 11/11/2017 08/26/2017 06/17/2017 08/26/2016 08/19/2016  Does Patient Have a Medical Advance Directive? Yes Yes Yes Yes Yes Yes Yes  Type of Paramedic of [place];Living will Living will;Healthcare Power of [place];Out of facility DNR (pink MOST or yellow form) Living will;Healthcare Power of [place];Out of facility DNR (pink MOST or yellow form) [place];Living will [place];Living will [place];Living will [place];Living will  Does patient want to make changes to medical advance directive? No - Patient declined - - No - Patient declined - No - Patient declined No - Patient declined  Copy of [place] in Chart? No - copy requested No - copy requested No - copy requested No - copy requested No - copy requested No - copy requested Yes  Would patient like information on creating a medical advance directive? - No - Patient declined No - Patient declined - - - -    Tobacco Social History   Tobacco Use  Smoking Status Former Smoker  . Packs/day: 1.00  . Years: 30.00  . Pack years: 30.00  . Types: Cigarettes  . Last attempt to quit: 06/22/2004  . Years since quitting: 14.1  Smokeless Tobacco Never Used     Counseling given: Not Answered   Clinical  Intake:  Pre-visit preparation completed: Yes  Pain : No/denies pain     Diabetes: No  How often do you need to have someone help you when you read instructions, pamphlets, or other written materials from your doctor or pharmacy?: 1 - Never  Interpreter Needed?: No     Past Medical History:  Diagnosis Date  . Aspiration precautions    uses thicken in liquids.  Eats pureed food.  . Barrett esophagus   . Depression   . Gallstones   . GERD (gastroesophageal reflux disease)   . Heart murmur    History of  . Hemorrhoids   . Hypertension   . Hypothyroidism   . Iron deficiency anemia 02/10/2012  . Mitral valve disease   . Myalgia    Multiple  . Neuromuscular disorder ([place])   . PAD (peripheral artery disease) (HCC)    lower legs  . Pain syndrome, chronic    Severe  . Pneumonia    three times, last time >10 years ago  . Psoriasis   . Rheumatic heart valve incompetence   . Shortness of breath dyspnea   . Thyroiditis    Past Surgical History:  Procedure Laterality Date  . BACK SURGERY    . CARDIAC CATHETERIZATION N/A 02/22/2015   Procedure: Left Heart Cath and Coronary Angiography;  Surgeon: Minna Merritts, MD;  Location: [facility];  Service: Cardiovascular;  Laterality: N/A;  . CATARACT EXTRACTION W/PHACO Left 07/29/2016  Procedure: CATARACT EXTRACTION PHACO AND INTRAOCULAR LENS PLACEMENT (IOC)  Left eye IVA Topical;  Surgeon: Leandrew Koyanagi, MD;  Location: [facility];  Service: Ophthalmology;  Laterality: Left;  . CATARACT EXTRACTION W/PHACO Right 08/19/2016   Procedure: CATARACT EXTRACTION PHACO AND INTRAOCULAR LENS PLACEMENT ([place]) right  TORIC;  Surgeon: Leandrew Koyanagi, MD;  Location: [facility];  Service: Ophthalmology;  Laterality: Right;  prefers early morning  . CHOLECYSTECTOMY  08/03/13  . COLONOSCOPY  03-08-12   Dr Donnella Sham  . COLONOSCOPY WITH PROPOFOL N/A 06/17/2017   Procedure: COLONOSCOPY WITH PROPOFOL;  Surgeon: [name], MD;  Location: [facility] ENDOSCOPY;  Service: Gastroenterology;  Laterality: N/A;  . DIRECT LARYNGOSCOPY N/A 02/17/2016   Procedure: DIRECT LARYNGOSCOPY;  Surgeon: Jodi Marble, MD;  Location: [facility];  Service: ENT;  Laterality: N/A;  . ESOPHAGEAL MANOMETRY N/A 02/27/2015   Procedure: ESOPHAGEAL MANOMETRY (EM);  Surgeon: Lollie Sails, MD;  Location: [facility] ENDOSCOPY;  Service: Endoscopy;  Laterality: N/A;  . ESOPHAGOGASTRODUODENOSCOPY (EGD) WITH PROPOFOL N/A 02/18/2015   Procedure: ESOPHAGOGASTRODUODENOSCOPY (EGD) WITH PROPOFOL;  Surgeon: Lollie Sails, MD;  Location: [facility]COPY;  Service: Endoscopy;  Laterality: N/A;  . ESOPHAGOGASTRODUODENOSCOPY (EGD) WITH PROPOFOL N/A 06/17/2017   Procedure: ESOPHAGOGASTRODUODENOSCOPY (EGD) WITH PROPOFOL;  Surgeon: [name], MD;  Location: [facility] ENDOSCOPY;  Service: Gastroenterology;  Laterality: N/A;  . ESOPHAGOSCOPY W/ BOTOX INJECTION N/A 02/17/2016   Procedure: ESOPHAGOSCOPY WITH BOTOX INJECTION  ;  Surgeon: Jodi Marble, MD;  Location: [facility];  Service: ENT;  Laterality: N/A;  . HEMORROIDECTOMY    . UPPER GI ENDOSCOPY  03-06-2013   Dr Donnella Sham   Family History  Problem Relation Age of Onset  . Heart failure Mother   . Heart failure Father   . Emphysema Father        smoked  . Cancer Sister   . Aneurysm Sister        Brain   Social History   Socioeconomic History  . Marital status: Married    Spouse name: Not on file  . Number of children: 2  . Years of education: Not on file  . Highest education level: Not on file  Occupational History  . 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 Occupation: Disabled  Social Needs  . Financial resource strain: Not hard at all  . Food insecurity:    Worry: Never true    Inability: Never true  . Transportation needs:    Medical: No    Non-medical: No  Tobacco Use  . Smoking status: Former Smoker    Packs/day: 1.00    Years:  30.00    Pack years: 30.00    Types: Cigarettes    Last attempt to quit: 06/22/2004    Years since quitting: 14.1  . Smokeless tobacco: Never Used  Substance and Sexual Activity  . Alcohol use: No    Alcohol/week: 0.0 standard drinks  . Drug use: No  . Sexual activity: Yes  Lifestyle  . Physical activity:    Days per week: 7 days    Minutes per session: 60 min  . Stress: Not at all  Relationships  . Social connections:    Talks on phone: Not on file    Gets together: Not on file    Attends religious service: Not on file    Active member of club or organization: Not on file    Attends meetings of clubs or organizations: Not on file  Relationship status: Not on file  Other Topics Concern  . Not on file  Social History Narrative   Drinks 4-5 cups of coffee a day and 3-4 cans of soda a day.    Outpatient Encounter Medications as of 08/30/2018  Medication Sig  . azelastine (ASTELIN) 0.1 % nasal spray Place 2 sprays into both nostrils 2 (two) times daily. Use in each nostril as directed  . celecoxib (CELEBREX) 200 MG capsule Take 200 mg by mouth 2 (two) times daily.  . cetirizine (ZYRTEC) 10 MG tablet Take 10 mg by mouth daily.  . clobetasol (TEMOVATE) 0.05 % cream Apply topically as needed. Reported on 12/03/2015  . clonazePAM (KLONOPIN) 2 MG tablet Take 1 tablet (2 mg total) by mouth 3 (three) times daily as needed (severe muscle spasm).  . colestipol (COLESTID) 1 G tablet Take 3 tablets (3 g total) by mouth 2 (two) times daily.  [name] esomeprazole (NEXIUM) 40 MG capsule TAKE 1 CAPSULE DAILY  . ezetimibe (ZETIA) 10 MG tablet TAKE 1 TABLET DAILY  . finasteride (PROSCAR) 5 MG tablet Take 1 tablet (5 mg total) by mouth daily.  [name] levothyroxine (SYNTHROID, LEVOTHROID) 75 MCG tablet Take 1 tablet (75 mcg total) by mouth daily.  . Multiple Vitamins-Minerals (CENTRUM SILVER PO) Take 1 tablet by mouth every evening.   . Plant Sterols and Stanols 450 MG CAPS Take by mouth 2 (two) times daily.    [name] RESVERATROL PO Take 1,000 mg by mouth 2 (two) times daily.  . [DISCONTINUED] fluticasone (FLONASE) 50 MCG/ACT nasal spray Place into both nostrils daily as needed for allergies or rhinitis.  . [DISCONTINUED] GARLIC OIL PO Take by mouth.  . [DISCONTINUED] Testosterone (AXIRON) 30 MG/ACT SOLN Apply 1 pump under each armpit every night   No facility-administered encounter medications on file as of 08/30/2018.     Activities of Daily Living In your present state of health, do you have any difficulty performing the following activities: 08/30/2018 11/11/2017  Hearing? N N  Vision? N N  Difficulty concentrating or making decisions? N N  Walking or climbing stairs? N N  Dressing or bathing? N N  Doing errands, shopping? N N  Preparing Food and eating ? N -  Using the Toilet? N -  In the past six months, have you accidently leaked urine? N -  Do you have problems with loss of bowel control? N -  Managing your Medications? N -  Managing your Finances? N -  Housekeeping or managing your Housekeeping? N -  Some recent data might be hidden    Patient Care Team: Leone Haven, MD as PCP - General (Family Medicine) Bary Castilla, Forest Gleason, MD (General Surgery) Jackolyn Confer, MD (Internal Medicine)   Assessment:   This is a routine wellness examination for Jhony.  Health Screenings  Colonoscopy -06/17/17 PSA 07/20/18 (1.9) Glaucoma -none Hearing  -demonstrates normal hearing during conversation Hemoglobin A1C -06/24/18 (5.6) Direct LDL-06/24/18 (146.0)  Social  Alcohol intake -no Smoking history- former Smokers in home? none Illicit drug use? none Exercise -walking daily, 60 minutes Diet -vegetable diet Sexually Active -yes  Safety  Patient feels safe at home.  Patient does have smoke detectors at home  Patient does wear sunscreen or protective clothing when in direct sunlight  Patient does wear seat belt when driving or riding with others.   Activities of Daily  Living Patient can do their own household chores. Denies needing assistance with: driving, feeding themselves, getting from bed to chair, getting  to the toilet, bathing/showering, dressing, managing money, climbing flight of stairs, or preparing meals.   Depression Screen Patient denies losing interest in daily life, feeling hopeless, or crying easily over simple problems.   Fall Screen Patient denies being afraid of falling.  No falls since last May 2019.   Memory Screen Patient denies problems with memory, misplacing items, and is able to balance checkbook/bank accounts. Patient is alert, normal appearance, oriented to person/place/and time. Correctly identified the president of the [place], recall of 3/3 objects, and performing simple calculations.  Patient displays appropriate judgement and can read correct time from watch face.   Immunizations The following Immunizations are up to date: Influenza, shingles, pneumonia, and tetanus.   Other Providers Patient Care Team: Leone Haven, MD as PCP - General (Family Medicine) Bary Castilla, Forest Gleason, MD (General Surgery) Jackolyn Confer, MD (Internal Medicine)  Exercise Activities and Dietary recommendations Current Exercise Habits: Home exercise routine, Type of exercise: walking, Time (Minutes): 60, Frequency (Times/Week): 7, Weekly Exercise (Minutes/Week): 420, Intensity: Moderate  Goals      Patient Stated   . Maintain Healthy Lifestyle (pt-stated)       Fall Risk Fall Risk  08/30/2018 08/26/2017 08/26/2016 12/18/2015 11/12/2015  Falls in the past year? 1 No No No Yes  Number falls in past yr: 0 - - - -  Comment None since May 2019 - - - -  Injury with Fall? 1 - - - No  Follow up Falls prevention discussed - - - -   Depression Screen PHQ 2/9 Scores 08/30/2018 08/26/2017 08/26/2016 12/18/2015  PHQ - 2 Score 0 0 1 0  PHQ- 9 Score - - 2 -    Cognitive Function MMSE - Mini Mental State Exam 08/26/2017 08/26/2016  Orientation to time 5 5   Orientation to Place 5 5  Registration 3 3  Attention/ Calculation 5 5  Recall 3 3  Language- name 2 objects 2 2  Language- repeat 1 1  Language- follow 3 step command 3 3  Language- read & follow direction 1 1  Write a sentence 1 1  Copy design 1 1  Total score 30 30     6CIT Screen 08/30/2018  What Year? 0 points  What month? 0 points  What time? 0 points  Count back from 20 0 points  Months in reverse 0 points  Repeat phrase 0 points  Total Score 0    Immunization History  Administered Date(s) Administered  . Influenza Split 03/12/2011, 03/14/2012  . Influenza, High Dose Seasonal PF 02/28/2015, 03/25/2016, 04/30/2017, 02/23/2018  . Influenza,inj,Quad PF,6+ Mos 03/31/2013, 04/24/2014  . Pneumococcal Conjugate-13 12/19/2013  . Pneumococcal Polysaccharide-23 08/12/2011  . Tdap 02/10/2012  . Zoster 02/17/2003  . Zoster Recombinat (Shingrix) 02/23/2018, 04/26/2018   Screening Tests Health Maintenance  Topic Date Due  . TETANUS/TDAP  02/09/2022  . COLONOSCOPY  06/18/2027  . INFLUENZA VACCINE  Completed  . PNA vac Low Risk Adult  Completed       Plan:    End of life planning; Advance aging; Advanced directives discussed. Copy of current HCPOA/Living Will requested.    I have personally reviewed and noted the following in the patient's chart:   . Medical and social history . Use of alcohol, tobacco or illicit drugs  . Current medications and supplements . Functional ability and status . Nutritional status . Physical activity . Advanced directives . List of other physicians . Hospitalizations, surgeries, and ER visits in previous 12 months . Vitals .  Screenings to include cognitive, depression, and falls . Referrals and appointments  In addition, I have reviewed and discussed with patient certain preventive protocols, quality metrics, and best practice recommendations. A written personalized care plan for preventive services as well as general preventive  health recommendations were provided to patient.     Varney Biles, LPN  11/19/1038

## 2018-08-30 NOTE — Progress Notes (Signed)
[name], MD Phone: [phone]  Timothy Turner is a 78 y.o. male who presents today for follow-up.  History of PE: Patient saw hematology.  That his PE and DVT were provoked and quis.  PVD: Patient notes that hematology brought up whether or not he should be on an aspirin and Plavix given his peripheral vascular disease.  He wonders if he should start on aspirin.  He does have some pain in his legs when he walks.  He has seen cardiology for his peripheral vascular disease previously.  Chronic muscle pain: Patient continues on clonazepam for the this.  Notes this does seem to help with his muscle pain.  He takes one 2 mg tablets twice daily.  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  He was told this by orthopedics.  He has not been on medication.  Tylenol has not been helpful.  He notes no tenderness.  He does note his hands hurt when he squeezes.  He notes popping in his wrist.  Sore throat: Patient notes 2 weeks ago he developed a cough with low-grade fever.  He improved with no intervention.  He has not traveled.  He feels back to normal.  The patient additionally wants to know about a nitrate oxide swab test his nitrate oxide levels  Social History   Tobacco Use  Smoking Status Former Smoker  . Packs/day: 1.00  . Years: 30.00  . Pack years: 30.00  . Types: Cigarettes  . Last attempt to quit: 06/22/2004  . Years since quitting: 14.2  Smokeless Tobacco Never Used     ROS see history of present illness  Objective  Physical Exam Vitals:   08/30/18 0845  BP: 98/70  Pulse: 71  Temp: 97.9 F (36.6 C)  SpO2: 95%    BP Readings from Last 3 Encounters:  08/30/18 98/70  08/30/18 98/70  08/01/18 124/84   Wt Readings from Last 3 Encounters:  08/30/18 172 lb 12.8 oz (78.4 kg)  08/30/18 172 lb 12.8 oz (78.4 kg)  08/01/18 177 lb 1.6 oz (80.3 kg)    Physical Exam Constitutional:      General: He is not in acute distress.  Appearance: He is not diaphoretic.  Cardiovascular:     Rate and Rhythm: Normal rate and regular rhythm.     Heart sounds: Normal heart sounds.  Pulmonary:     Effort: Pulmonary effort is normal.     Breath sounds: Normal breath sounds.  Musculoskeletal:     Right lower leg: No edema.     Left lower leg: No edema.     Comments: Bilateral hands with thumb CMC and CMP joint enlargement and index and middle finger CMC joint enlargement, no tenderness, no warmth, no erythema, no tenderness of his wrists or other joints in his hands  Skin:    General: Skin is warm and dry.  Neurological:     Mental Status: He is alert.      Assessment/Plan: Please see individual problem list.  Pulmonary embolism [facility]) Patient is no longer on anticoagulation after seeing hematology.  He will monitor.  PVD (peripheral vascular disease) Patient will start on 81 mg aspirin.  We will send a message to his cardiologist to see if they feel that Plavix would be indicated.  I did discuss with the patient that I felt as though Plavix would not be an this time.  Myalgia Chronic issue.  He is interested in tapering down on the Klonopin.  We will  have him decrease to 1 mg of Klonopin twice daily.  URI (upper respiratory infection) Symptoms have resolved.  He will monitor for recurrence.  Advised the patient that I look into the nitrate oxide swab.  Orders Placed This Encounter  Procedures  . Comp Met (CMET)  . Lipid panel  . Ambulatory referral to Rheumatology    Referral Priority:   Routine    Referral Type:   Consultation    Referral Reason:   Specialty Services Required    Requested Specialty:   Rheumatology    Number of Visits Requested:   1    Meds ordered this encounter  Medications  . clonazePAM (KLONOPIN) 2 MG tablet    Sig: Take 0.5 tablets (1 mg total) by mouth 2 (two) times daily as needed (severe muscle spasm).    Dispense:  90 tablet    Refill:  1     [name], MD [place]

## 2018-08-30 NOTE — Patient Instructions (Addendum)
  Timothy Turner , Thank you for taking time to come for your Medicare Wellness Visit. I appreciate your ongoing commitment to your health goals. Please review the following plan we discussed and let me know if I can assist you in the future.   These are the goals we discussed: Goals      Patient Stated   . Maintain Healthy Lifestyle (pt-stated)       This is a list of the screening recommended for you and due dates:  Health Maintenance  Topic Date Due  . Tetanus Vaccine  02/09/2022  . Colon Cancer Screening  06/18/2027  . Flu Shot  Completed  . Pneumonia vaccines  Completed

## 2018-08-30 NOTE — Patient Instructions (Signed)
Nice to see you. Please decrease your clonazepam to 1 mg twice daily. Please start on an 81 mg aspirin. We will get you to see rheumatology for your hand pain.

## 2018-08-31 NOTE — Progress Notes (Signed)
Reviewed. No acute issues. Will follow with PCP

## 2018-09-05 DIAGNOSIS — M79642 Pain in left hand: Principal | ICD-10-CM

## 2018-09-05 DIAGNOSIS — M79641 Pain in right hand: Secondary | ICD-10-CM | POA: Insufficient documentation

## 2018-09-05 DIAGNOSIS — J069 Acute upper respiratory infection, unspecified: Secondary | ICD-10-CM | POA: Insufficient documentation

## 2018-09-05 NOTE — Assessment & Plan Note (Signed)
Patient is no longer on anticoagulation after seeing hematology.  He will monitor.

## 2018-09-05 NOTE — Assessment & Plan Note (Signed)
Chronic issue.  He is interested in tapering down on the Klonopin.  We will have him decrease to 1 mg of Klonopin twice daily.

## 2018-09-05 NOTE — Assessment & Plan Note (Signed)
Symptoms have resolved.  He will monitor for recurrence. 

## 2018-09-05 NOTE — Assessment & Plan Note (Signed)
Patient will start on 81 mg aspirin.  We will send a message to his cardiologist to see if they feel that Plavix would be indicated.  I did discuss with the patient that I felt as though Plavix would not be an this time.

## 2018-09-06 DIAGNOSIS — M154 Erosive (osteo)arthritis: Secondary | ICD-10-CM | POA: Insufficient documentation

## 2018-09-06 DIAGNOSIS — L409 Psoriasis, unspecified: Secondary | ICD-10-CM | POA: Insufficient documentation

## 2018-09-11 ENCOUNTER — Telehealth: Payer: Self-pay | Admitting: Family Medicine

## 2018-09-11 NOTE — Telephone Encounter (Signed)
-----   Message from Minna Merritts, MD sent at 09/10/2018  9:59 PM EDT ----- He has significant PAD by prior scans and could take either asa or plavix Thx TG ----- Message ----- From: Leone Haven, MD Sent: 09/05/2018   1:57 PM EDT To: Minna Merritts, MD  Hi Dr Rockey Situ,   I saw Mr Valeriano last week for follow-up. He wanted to know if he should be taking plavix for his PVD. He has not been on an 81 mg aspirin, so I advised that he should start with that. It looks like his recent testing did not reveal any blood flow limitation in his legs and thus I don't think plavix is indicated at this time, though I wanted to check to see what you thought about this? Thanks for your help.  Kenwood Hiss

## 2018-09-11 NOTE — Telephone Encounter (Signed)
Please let the patient know that I heard back from his cardiologist and Dr Rockey Situ noted that the patient could take either aspirin or plavix. I think he could continue with the aspirin 81 mg daily. Thanks.

## 2018-09-12 NOTE — Telephone Encounter (Signed)
Called and spoke with pt. Pt advised and voiced understanding.  

## 2018-09-27 ENCOUNTER — Ambulatory Visit (INDEPENDENT_AMBULATORY_CARE_PROVIDER_SITE_OTHER): Payer: Medicare Other

## 2018-09-27 ENCOUNTER — Encounter: Payer: Self-pay | Admitting: Podiatry

## 2018-09-27 ENCOUNTER — Other Ambulatory Visit: Payer: Self-pay

## 2018-09-27 ENCOUNTER — Ambulatory Visit (INDEPENDENT_AMBULATORY_CARE_PROVIDER_SITE_OTHER): Payer: Medicare Other | Admitting: Podiatry

## 2018-09-27 ENCOUNTER — Other Ambulatory Visit: Payer: Self-pay | Admitting: Podiatry

## 2018-09-27 VITALS — Temp 97.9°F

## 2018-09-27 DIAGNOSIS — M722 Plantar fascial fibromatosis: Secondary | ICD-10-CM

## 2018-09-27 DIAGNOSIS — M7741 Metatarsalgia, right foot: Secondary | ICD-10-CM

## 2018-09-27 DIAGNOSIS — M7742 Metatarsalgia, left foot: Secondary | ICD-10-CM | POA: Diagnosis not present

## 2018-09-27 MED ORDER — METHYLPREDNISOLONE 4 MG PO TBPK: ORAL_TABLET | ORAL | 0 refills | Status: DC

## 2018-09-27 NOTE — Progress Notes (Signed)
   HPI: 78 year old male presents the office today for bilateral forefoot pain.  Patient states that after being on his feet for greater than 30 minutes he feels like he is walking on bones and his feet go numb and they feel hot at times.  Patient denies trauma or injury.  He has been taking Tylenol with no relief.  He is also modified and changed shoe gear with minimal improvement  Past Medical History:  Diagnosis Date  . Aspiration precautions    uses thicken in liquids.  Eats pureed food.  . Barrett esophagus   . Depression   . Gallstones   . GERD (gastroesophageal reflux disease)   . Heart murmur    History of  . Hemorrhoids   . Hypertension   . Hypothyroidism   . Iron deficiency anemia 02/10/2012  . Mitral valve disease   . Myalgia    Multiple  . Neuromuscular disorder ([place])   . PAD (peripheral artery disease) (HCC)    lower legs  . Pain syndrome, chronic    Severe  . Pneumonia    three times, last time >10 years ago  . Psoriasis   . Rheumatic heart valve incompetence   . Shortness of breath dyspnea   . Thyroiditis      Physical Exam: General: The patient is alert and oriented x3 in no acute distress.  Dermatology: Skin is warm, dry and supple bilateral lower extremities. Negative for open lesions or macerations.  Vascular: Palpable pedal pulses bilaterally. No edema or erythema noted. Capillary refill within normal limits.  Neurological: Epicritic and protective threshold grossly intact bilaterally.   Musculoskeletal Exam: Range of motion within normal limits to all pedal and ankle joints bilateral. Muscle strength 5/5 in all groups bilateral.  Pain on palpation and range of motion to the bilateral forefoot consistent with metatarsalgia  Radiographic Exam:  Normal osseous mineralization. Joint spaces preserved. No fracture/dislocation/boney destruction.    Assessment: 1.  Bilateral forefoot metatarsalgia   Plan of Care:  1. Patient evaluated. X-Rays reviewed.   2.  Offloading metatarsal pads dispensed 3.  Patient cannot tolerate oral NSAIDs secondary to Barrett's esophagus 4.  Prescription for Medrol Dosepak 5.  Recommend good supportive shoe gear with over-the-counter arch supports 6.  Plantar fascial braces dispensed bilateral to support the arches the foot and take some pressure off the forefoot 7.  Return to clinic as needed      Edrick Kins, DPM [facility]  Dr. Edrick Kins, DPM    [address],  [address]                Office [phone]  Fax [phone]

## 2018-11-12 ENCOUNTER — Other Ambulatory Visit: Payer: Self-pay | Admitting: Family Medicine

## 2018-11-25 ENCOUNTER — Emergency Department: Payer: Medicare Other

## 2018-11-25 ENCOUNTER — Emergency Department
Admission: EM | Admit: 2018-11-25 | Discharge: 2018-11-25 | Disposition: A | Discharge status: Home / Self Care | Payer: Medicare Other | Attending: Emergency Medicine | Admitting: Emergency Medicine

## 2018-11-25 ENCOUNTER — Other Ambulatory Visit: Payer: Self-pay

## 2018-11-25 DIAGNOSIS — S2241XA Multiple fractures of ribs, right side, initial encounter for closed fracture: Secondary | ICD-10-CM | POA: Insufficient documentation

## 2018-11-25 DIAGNOSIS — Z7982 Long term (current) use of aspirin: Secondary | ICD-10-CM | POA: Diagnosis not present

## 2018-11-25 DIAGNOSIS — E039 Hypothyroidism, unspecified: Secondary | ICD-10-CM | POA: Insufficient documentation

## 2018-11-25 DIAGNOSIS — I129 Hypertensive chronic kidney disease with stage 1 through stage 4 chronic kidney disease, or unspecified chronic kidney disease: Secondary | ICD-10-CM | POA: Diagnosis not present

## 2018-11-25 DIAGNOSIS — Y929 Unspecified place or not applicable: Secondary | ICD-10-CM | POA: Diagnosis not present

## 2018-11-25 DIAGNOSIS — Z87891 Personal history of nicotine dependence: Secondary | ICD-10-CM | POA: Diagnosis not present

## 2018-11-25 DIAGNOSIS — Y999 Unspecified external cause status: Secondary | ICD-10-CM | POA: Insufficient documentation

## 2018-11-25 DIAGNOSIS — W010XXA Fall on same level from slipping, tripping and stumbling without subsequent striking against object, initial encounter: Secondary | ICD-10-CM | POA: Insufficient documentation

## 2018-11-25 DIAGNOSIS — Y939 Activity, unspecified: Secondary | ICD-10-CM | POA: Insufficient documentation

## 2018-11-25 DIAGNOSIS — N183 Chronic kidney disease, stage 3 (moderate): Secondary | ICD-10-CM | POA: Insufficient documentation

## 2018-11-25 DIAGNOSIS — Z79899 Other long term (current) drug therapy: Secondary | ICD-10-CM | POA: Insufficient documentation

## 2018-11-25 DIAGNOSIS — J449 Chronic obstructive pulmonary disease, unspecified: Secondary | ICD-10-CM | POA: Diagnosis not present

## 2018-11-25 DIAGNOSIS — M25562 Pain in left knee: Secondary | ICD-10-CM | POA: Diagnosis not present

## 2018-11-25 DIAGNOSIS — W19XXXA Unspecified fall, initial encounter: Secondary | ICD-10-CM

## 2018-11-25 DIAGNOSIS — R109 Unspecified abdominal pain: Secondary | ICD-10-CM | POA: Insufficient documentation

## 2018-11-25 DIAGNOSIS — S29001A Unspecified injury of muscle and tendon of front wall of thorax, initial encounter: Secondary | ICD-10-CM | POA: Diagnosis present

## 2018-11-25 LAB — COMPREHENSIVE METABOLIC PANEL
ALT: 12 U/L (ref 0–44)
AST: 22 U/L (ref 15–41)
Albumin: 3.8 g/dL (ref 3.5–5.0)
Alkaline Phosphatase: 83 U/L (ref 38–126)
Anion gap: 9 (ref 5–15)
BUN: 11 mg/dL (ref 8–23)
CO2: 25 mmol/L (ref 22–32)
Calcium: 9.3 mg/dL (ref 8.9–10.3)
Chloride: 107 mmol/L (ref 98–111)
Creatinine, Ser: 1.22 mg/dL (ref 0.61–1.24)
GFR calc Af Amer: >60 mL/min (ref >60)
GFR calc non Af Amer: 58 mL/min — ABNORMAL LOW (ref >60)
Glucose, Bld: 121 mg/dL — ABNORMAL HIGH (ref 70–99)
Potassium: 4.3 mmol/L (ref 3.5–5.1)
Sodium: 141 mmol/L (ref 135–145)
Total Bilirubin: 0.9 mg/dL (ref 0.3–1.2)
Total Protein: 6.4 g/dL — ABNORMAL LOW (ref 6.5–8.1)

## 2018-11-25 LAB — CBC WITH DIFFERENTIAL/PLATELET
Abs Immature Granulocytes: 0.04 10*3/uL (ref 0.00–0.07)
Basophils Absolute: 0.1 10*3/uL (ref 0.0–0.1)
Basophils Relative: 1 %
Eosinophils Absolute: 0.1 10*3/uL (ref 0.0–0.5)
Eosinophils Relative: 1 %
HCT: 42.9 % (ref 39.0–52.0)
Hemoglobin: 14 g/dL (ref 13.0–17.0)
Immature Granulocytes: 0 %
Lymphocytes Relative: 30 %
Lymphs Abs: 3.1 10*3/uL (ref 0.7–4.0)
MCH: 28.9 pg (ref 26.0–34.0)
MCHC: 32.6 g/dL (ref 30.0–36.0)
MCV: 88.5 fL (ref 80.0–100.0)
Monocytes Absolute: 0.9 10*3/uL (ref 0.1–1.0)
Monocytes Relative: 9 %
Neutro Abs: 6.2 10*3/uL (ref 1.7–7.7)
Neutrophils Relative %: 59 %
Platelets: 212 10*3/uL (ref 150–400)
RBC: 4.85 MIL/uL (ref 4.22–5.81)
RDW: 13.6 % (ref 11.5–15.5)
WBC: 10.4 10*3/uL (ref 4.0–10.5)
nRBC: 0 % (ref 0.0–0.2)

## 2018-11-25 MED ORDER — ONDANSETRON HCL 4 MG/2ML IJ SOLN
4.0000 mg | Freq: Once | INTRAMUSCULAR | Status: AC
  Administered 2018-11-25: 4 mg via INTRAVENOUS
  Filled 2018-11-25: qty 2

## 2018-11-25 MED ORDER — OXYCODONE-ACETAMINOPHEN 5-325 MG PO TABS
1.0000 | ORAL_TABLET | Freq: Once | ORAL | Status: DC
  Filled 2018-11-25: qty 1

## 2018-11-25 MED ORDER — OXYCODONE-ACETAMINOPHEN 5-325 MG PO TABS: 1.0000 | ORAL_TABLET | ORAL | 0 refills | Status: DC | PRN

## 2018-11-25 MED ORDER — SODIUM CHLORIDE 0.9 % IV BOLUS
1000.0000 mL | Freq: Once | INTRAVENOUS | Status: AC
  Administered 2018-11-25: 1000 mL via INTRAVENOUS

## 2018-11-25 MED ORDER — IOHEXOL 300 MG/ML  SOLN
100.0000 mL | Freq: Once | INTRAMUSCULAR | Status: AC | PRN
  Administered 2018-11-25: 100 mL via INTRAVENOUS

## 2018-11-25 MED ORDER — FENTANYL CITRATE (PF) 100 MCG/2ML IJ SOLN
25.0000 ug | Freq: Once | INTRAMUSCULAR | Status: AC
  Administered 2018-11-25: 25 ug via INTRAVENOUS
  Filled 2018-11-25: qty 2

## 2018-11-25 NOTE — ED Notes (Signed)
RT at bedside to instruct pt on use of incentive spirometry

## 2018-11-25 NOTE — ED Notes (Signed)
Pt to CT via stretcher accomp by CT tech 

## 2018-11-25 NOTE — Discharge Instructions (Addendum)
1.  You may take Percocet as needed for pain. 2.  Use incentive spirometer as instructed. 3.  Apply ice to affected area several times daily. 4.  Return to the ER for worsening symptoms, persistent vomiting, difficulty breathing or other concerns.

## 2018-11-25 NOTE — ED Triage Notes (Signed)
Patient reports mechanical fall at home yesterday. Patient c/o right lower rib/flank pain. Patient c/o left knee pain.

## 2018-11-25 NOTE — ED Provider Notes (Signed)
[facility] Emergency Department Provider Note   ____________________________________________   First MD Initiated Contact with Patient 11/25/18 0327     (approximate)  I have reviewed the triage vital signs and the nursing notes.   HISTORY  Chief Complaint Fall    HPI Timothy Turner is a 78 y.o. male who presents to the ED from home status post mechanical fall at home yesterday.  Patient tripped over his lawnmower and fell, striking his right lower ribs and left knee.  Denies LOC.  Takes a baby aspirin daily.  Complains of difficulty taking a deep breath.  Denies recent fever, cough, shortness of breath, nausea, vomiting.  Denies recent travel or exposure to persons diagnosed with coronavirus.       Past Medical History:  Diagnosis Date  . Aspiration precautions    uses thicken in liquids.  Eats pureed food.  . Barrett esophagus   . Depression   . Gallstones   . GERD (gastroesophageal reflux disease)   . Heart murmur    History of  . Hemorrhoids   . Hypertension   . Hypothyroidism   . Iron deficiency anemia 02/10/2012  . Mitral valve disease   . Myalgia    Multiple  . Neuromuscular disorder ([place])   . PAD (peripheral artery disease) (HCC)    lower legs  . Pain syndrome, chronic    Severe  . Pneumonia    three times, last time >10 years ago  . Psoriasis   . Pulmonary embolism ([place]) 2019   left lung  . Rheumatic heart valve incompetence   . Shortness of breath dyspnea   . Thyroiditis     Patient Active Problem List   Diagnosis Date Noted  . 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 Psoriasis 09/06/2018  . Bilateral hand pain 09/05/2018  . URI (upper respiratory infection) 09/05/2018  . Thyroid nodule 07/21/2018  . De Quervain's tenosynovitis 06/25/2018  . Intermittent claudication ([place]) 06/25/2018  . Upper airway cough syndrome 03/04/2018  . 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 Headache 02/23/2018  . Chronic kidney  disease (CKD), stage III (moderate) ([place]) 02/23/2018  . 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 Pneumonia 11/18/2017  . Pulmonary embolism ([place]) 11/11/2017  . Left knee pain 10/29/2017  . Rhinitis 08/26/2017  . Cheilitis 06/02/2017  . Clay-colored stools 05/13/2017  . Dyspnea on exertion 02/24/2017  . Solitary pulmonary nodule on lung CT 02/24/2017  . Gastroesophageal reflux disease 02/11/2016  . Aspiration into airway 01/14/2016  . Incomplete emptying of bladder 07/31/2015  . Angina pectoris ([place]) 02/22/2015  . COPD (chronic obstructive pulmonary disease) ([place]) 02/06/2015  . Adjustment disorder with depressed mood 11/12/2014  . Dysphagia 11/12/2014  . Mitral valve prolapse 09/12/2014  . Renal insufficiency 01/22/2014  . Disorder of kidney and ureter 01/22/2014  . Muscle cramps 12/19/2013  . Mitral valve regurgitation 09/14/2013  . 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 Elevated prostate specific antigen (PSA) 03/01/2012  . Nodular prostate with urinary obstruction 03/01/2012  . 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 Urinary hesitancy 03/01/2012  . Smoking history 02/01/2012  . Chronic pain syndrome 08/12/2011  . 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 Myalgia 03/12/2011  . Hypothyroidism 03/12/2011  . PVD (peripheral vascular disease) ([place]) 01/27/2011  . MURMUR 07/24/2009    Past Surgical History:  Procedure Laterality Date  . BACK SURGERY    . CARDIAC CATHETERIZATION N/A 02/22/2015  Procedure: Left Heart Cath and Coronary Angiography;  Surgeon: Minna Merritts, MD;  Location: [place] [facility];  Service: Cardiovascular;  Laterality: N/A;  . CATARACT EXTRACTION W/PHACO Left 07/29/2016   Procedure: CATARACT EXTRACTION PHACO AND INTRAOCULAR LENS PLACEMENT (IOC)  Left eye IVA Topical;  Surgeon: Leandrew Koyanagi, MD;  Location: [place];  Service: Ophthalmology;   Laterality: Left;  . CATARACT EXTRACTION W/PHACO Right 08/19/2016   Procedure: CATARACT EXTRACTION PHACO AND INTRAOCULAR LENS PLACEMENT ([place]) right  TORIC;  Surgeon: Leandrew Koyanagi, MD;  Location: [place];  Service: Ophthalmology;  Laterality: Right;  prefers early morning  . CHOLECYSTECTOMY  08/03/13  . COLONOSCOPY  03-08-12   Dr Donnella Sham  . COLONOSCOPY WITH PROPOFOL N/A 06/17/2017   Procedure: COLONOSCOPY WITH PROPOFOL;  Surgeon: [name], MD;  Location: [facility]COPY;  Service: Gastroenterology;  Laterality: N/A;  . DIRECT LARYNGOSCOPY N/A 02/17/2016   Procedure: DIRECT LARYNGOSCOPY;  Surgeon: Jodi Marble, MD;  Location: [place];  Service: ENT;  Laterality: N/A;  . ESOPHAGEAL MANOMETRY N/A 02/27/2015   Procedure: ESOPHAGEAL MANOMETRY (EM);  Surgeon: Lollie Sails, MD;  Location: [facility] ENDOSCOPY;  Service: Endoscopy;  Laterality: N/A;  . ESOPHAGOGASTRODUODENOSCOPY (EGD) WITH PROPOFOL N/A 02/18/2015   Procedure: ESOPHAGOGASTRODUODENOSCOPY (EGD) WITH PROPOFOL;  Surgeon: Lollie Sails, MD;  Location: [facility] ENDOSCOPY;  Service: Endoscopy;  Laterality: N/A;  . ESOPHAGOGASTRODUODENOSCOPY (EGD) WITH PROPOFOL N/A 06/17/2017   Procedure: ESOPHAGOGASTRODUODENOSCOPY (EGD) WITH PROPOFOL;  Surgeon: [name], MD;  Location: [facility] ENDOSCOPY;  Service: Gastroenterology;  Laterality: N/A;  . ESOPHAGOSCOPY W/ BOTOX INJECTION N/A 02/17/2016   Procedure: ESOPHAGOSCOPY WITH BOTOX INJECTION  ;  Surgeon: Jodi Marble, MD;  Location: [place];  Service: ENT;  Laterality: N/A;  . HEMORROIDECTOMY    . UPPER GI ENDOSCOPY  03-06-2013   Dr Donnella Sham    Prior to Admission medications   Medication Sig Start Date End Date Taking? Authorizing Provider  azelastine (ASTELIN) 0.1 % nasal spray Place 2 sprays into both nostrils 2 (two) times daily. Use in each nostril as directed 08/26/17   Leone Haven, MD  celecoxib (CELEBREX) 200 MG capsule Take 200 mg by mouth 2  (two) times daily.    [provider]  cetirizine (ZYRTEC) 10 MG tablet Take 10 mg by mouth daily.    [provider]  clobetasol (TEMOVATE) 0.05 % cream Apply topically as needed. Reported on 12/03/2015    [provider]  clonazePAM (KLONOPIN) 2 MG tablet Take 0.5 tablets (1 mg total) by mouth 2 (two) times daily as needed (severe muscle spasm). 08/30/18   Leone Haven, MD  colestipol (COLESTID) 1 G tablet Take 3 tablets (3 g total) by mouth 2 (two) times daily. 03/19/15   Minna Merritts, MD  esomeprazole (NEXIUM) 40 MG capsule TAKE 1 CAPSULE DAILY 11/22/17   Leone Haven, MD  ezetimibe (ZETIA) 10 MG tablet TAKE 1 TABLET DAILY 04/11/18   Leone Haven, MD  finasteride (PROSCAR) 5 MG tablet Take 1 tablet (5 mg total) by mouth daily. 01/17/18   Abbie Sons, MD  levothyroxine (SYNTHROID) 75 MCG tablet TAKE 1 TABLET DAILY 11/16/18   Leone Haven, MD  methylPREDNISolone (MEDROL DOSEPAK) 4 MG TBPK tablet 6 day dose pack - take as directed 09/27/18   Edrick Kins, DPM  Multiple Vitamins-Minerals (CENTRUM SILVER PO) Take 1 tablet by mouth every evening.     [provider]  Plant Sterols and Stanols 450 MG  CAPS Take by mouth 2 (two) times daily.    [provider]  RESVERATROL PO Take 1,000 mg by mouth 2 (two) times daily.    [provider]    Allergies Duloxetine; Nsaids; Rosuvastatin; Statins; and Tolmetin  Family History  Problem Relation Age of Onset  . Heart failure Mother   . Heart failure Father   . Emphysema Father        smoked  . Cancer Sister   . Aneurysm Sister        Brain    Social History Social History   Tobacco Use  . Smoking status: Former Smoker    Packs/day: 1.00    Years: 30.00    Pack years: 30.00    Types: Cigarettes    Last attempt to quit: 06/22/2004    Years since quitting: 14.4  . Smokeless tobacco: Never Used  Substance Use Topics  . Alcohol use: No    Alcohol/week: 0.0  standard drinks  . Drug use: No    Review of Systems  Constitutional: No fever/chills Eyes: No visual changes. ENT: No sore throat. Cardiovascular: Positive for right lower rib pain. Respiratory: Denies shortness of breath. Gastrointestinal: Positive for abdominal pain.  No nausea, no vomiting.  No diarrhea.  No constipation. Genitourinary: Negative for dysuria. Musculoskeletal: Negative for back pain. Skin: Negative for rash. Neurological: Negative for headaches, focal weakness or numbness.   ____________________________________________   PHYSICAL EXAM:  VITAL SIGNS: ED Triage Vitals  Enc Vitals Group     BP 11/25/18 0149 (!) 155/90     Pulse Rate 11/25/18 0149 76     Resp 11/25/18 0149 (!) 23     Temp 11/25/18 0149 98.2 F (36.8 C)     Temp src --      SpO2 11/25/18 0149 97 %     Weight 11/25/18 0150 160 lb (72.6 kg)     Height 11/25/18 0150 6' (1.829 m)     Head Circumference --      Peak Flow --      Pain Score 11/25/18 0150 1     Pain Loc --      Pain Edu? --      Excl. in [place]? --     Constitutional: Alert and oriented. Well appearing and in mild acute distress. Eyes: Conjunctivae are normal. PERRL. EOMI. Head: Atraumatic. Nose: Atraumatic. Mouth/Throat: Mucous membranes are moist.  No dental malocclusion. Neck: No stridor.  No cervical spine tenderness to palpation. Cardiovascular: Normal rate, regular rhythm. Grossly normal heart sounds.  Good peripheral circulation. Respiratory: Normal respiratory effort.  No retractions. Lungs CTAB.  Splinting.  No crepitus.  Right lower ribs tender to palpation. Gastrointestinal: Soft and mildly diffusely tender to palpation without rebound or guarding. No distention. No abdominal bruits.  Mild right CVA tenderness. Musculoskeletal: Left knee with mild swelling.  Tender to palpation anteriorly.  Full range of motion without pain.  2+ distal pulses.  Brisk, less than 5-second capillary refill. Neurologic:  Normal speech  and language. No gross focal neurologic deficits are appreciated. No gait instability. Skin:  Skin is warm, dry and intact. No rash noted. Psychiatric: Mood and affect are normal. Speech and behavior are normal.  ____________________________________________   LABS (all labs ordered are listed, but only abnormal results are displayed)  Labs Reviewed  COMPREHENSIVE METABOLIC PANEL - Abnormal; Notable for the following components:      Result Value   Glucose, Bld 121 (*)    Total Protein 6.4 (*)  GFR calc non Af Amer 58 (*)    All other components within normal limits  CBC WITH DIFFERENTIAL/PLATELET  URINALYSIS, COMPLETE (UACMP) WITH MICROSCOPIC   ____________________________________________  EKG  None ____________________________________________  RADIOLOGY  ED MD interpretation:  Xrays demonstrate minimally displaced fractures 7th and 8th ribs; no acute osseous knee injuries.  CT head/chest/abdomen/pelvis: No ICH, right rib fractures 8-11 with mild displacement; no hemothorax or pneumothorax; no intra-abdominal injuries  Official radiology report(s): Dg Ribs Unilateral W/chest Right  Result Date: 11/25/2018 CLINICAL DATA:  Right lower rib and flank pain status post fall. EXAM: RIGHT RIBS AND CHEST - 3+ VIEW COMPARISON:  Chest x-ray dated 03/04/2018. FINDINGS: The heart size is stable. There is no pneumothorax. No large pleural effusion or infiltrate. The lungs appear somewhat hyperexpanded. There are acute minimally displaced fractures involving the seventh and eighth ribs anteriorly on the right. IMPRESSION: Acute minimally displaced fractures involving the seventh and eighth ribs anteriorly on the right. There is no pneumothorax. Electronically Signed   By: Constance Holster M.D.   On: 11/25/2018 02:55   Ct Head Wo Contrast  Result Date: 11/25/2018 CLINICAL DATA:  Fall EXAM: CT HEAD WITHOUT CONTRAST TECHNIQUE: Contiguous axial images were obtained from the base of the skull  through the vertex without intravenous contrast. COMPARISON:  05/19/2015 FINDINGS: Brain: No evidence of acute infarction, hemorrhage, hydrocephalus, extra-axial collection or mass lesion/mass effect. Vascular: Atherosclerotic calcification. Skull: Negative for fracture Sinuses/Orbits: No evidence of injury. There is been bilateral cataract resection. IMPRESSION: No evidence of intracranial injury. Electronically Signed   By: Monte Fantasia M.D.   On: 11/25/2018 04:48   Ct Chest W Contrast  Result Date: 11/25/2018 CLINICAL DATA:  Right abdominal pain after fall EXAM: CT CHEST, ABDOMEN, AND PELVIS WITH CONTRAST TECHNIQUE: Multidetector CT imaging of the chest, abdomen and pelvis was performed following the standard protocol during bolus administration of intravenous contrast. CONTRAST:  125mL OMNIPAQUE IOHEXOL 300 MG/ML  SOLN COMPARISON:  11/11/2017 FINDINGS: CT CHEST FINDINGS Cardiovascular: No cardiomegaly or pericardial effusion. Aortic and coronary atherosclerosis. No acute finding Mediastinum/Nodes: Negative for pneumomediastinum or hematoma. Lungs/Pleura: Lower lobe scarring or atelectasis with microlithiasis seen on the left. No hemothorax, pneumothorax, or lung contusion. Mild emphysema. Musculoskeletal: Lateral right rib fractures of ribs 8, 9, 10, and 11. There is up to mild displacement. See reformats. Negative for thoracic spine fracture. There is spondylosis and degenerative disc narrowing. CT ABDOMEN PELVIS FINDINGS Hepatobiliary: No hepatic injury or perihepatic hematoma. Gallbladder is surgically absent. Small low-density in the left liver and caudate that is too small for densitometry, stable. Pancreas: Unremarkable. No pancreatic ductal dilatation or surrounding inflammatory changes. Spleen: Normal in size without focal abnormality. Adrenals/Urinary Tract: No adrenal hemorrhage or renal injury identified. Bladder is unremarkable. Stomach/Bowel: Mild colonic diverticulosis.  No evidence of injury  Vascular/Lymphatic: Atherosclerotic calcification. Negative for adenopathy Reproductive: Negative Other: No ascites or pneumoperitoneum Musculoskeletal: Negative for acute fracture. Spondylosis and degenerative disc narrowing. IMPRESSION: 1. Right rib fractures of 8 through 11 with up to mild displacement. No hemothorax or pneumothorax. 2. No evidence of intra-abdominal injury. Electronically Signed   By: Monte Fantasia M.D.   On: 11/25/2018 04:57   Ct Abdomen Pelvis W Contrast  Result Date: 11/25/2018 CLINICAL DATA:  Right abdominal pain after fall EXAM: CT CHEST, ABDOMEN, AND PELVIS WITH CONTRAST TECHNIQUE: Multidetector CT imaging of the chest, abdomen and pelvis was performed following the standard protocol during bolus administration of intravenous contrast. CONTRAST:  143mL OMNIPAQUE IOHEXOL 300 MG/ML  SOLN  COMPARISON:  11/11/2017 FINDINGS: CT CHEST FINDINGS Cardiovascular: No cardiomegaly or pericardial effusion. Aortic and coronary atherosclerosis. No acute finding Mediastinum/Nodes: Negative for pneumomediastinum or hematoma. Lungs/Pleura: Lower lobe scarring or atelectasis with microlithiasis seen on the left. No hemothorax, pneumothorax, or lung contusion. Mild emphysema. Musculoskeletal: Lateral right rib fractures of ribs 8, 9, 10, and 11. There is up to mild displacement. See reformats. Negative for thoracic spine fracture. There is spondylosis and degenerative disc narrowing. CT ABDOMEN PELVIS FINDINGS Hepatobiliary: No hepatic injury or perihepatic hematoma. Gallbladder is surgically absent. Small low-density in the left liver and caudate that is too small for densitometry, stable. Pancreas: Unremarkable. No pancreatic ductal dilatation or surrounding inflammatory changes. Spleen: Normal in size without focal abnormality. Adrenals/Urinary Tract: No adrenal hemorrhage or renal injury identified. Bladder is unremarkable. Stomach/Bowel: Mild colonic diverticulosis.  No evidence of injury  Vascular/Lymphatic: Atherosclerotic calcification. Negative for adenopathy Reproductive: Negative Other: No ascites or pneumoperitoneum Musculoskeletal: Negative for acute fracture. Spondylosis and degenerative disc narrowing. IMPRESSION: 1. Right rib fractures of 8 through 11 with up to mild displacement. No hemothorax or pneumothorax. 2. No evidence of intra-abdominal injury. Electronically Signed   By: Monte Fantasia M.D.   On: 11/25/2018 04:57   Dg Knee Complete 4 Views Left  Result Date: 11/25/2018 CLINICAL DATA:  Pain status post fall EXAM: LEFT KNEE - COMPLETE 4+ VIEW COMPARISON:  None. FINDINGS: No evidence of fracture, dislocation, or joint effusion. There appears to be a well-corticated osseous fragment adjacent to the head of the fibula. This may represent sequela of an old injury or artifact. There is no significant soft tissue swelling in this location. It is not well appreciated on the lateral view. No evidence of arthropathy or other focal bone abnormality. Soft tissues are unremarkable. IMPRESSION: 1. No acute displaced fracture or dislocation. 2. Apparent wall corticated osseous fragment projecting over the fibular head is favored to represent artifact or sequela of an old remote injury. Correlation with point tenderness is recommended to help exclude an avulsion fracture. There does not appear to be any adjacent surrounding soft tissue swelling. Electronically Signed   By: Constance Holster M.D.   On: 11/25/2018 02:58    ____________________________________________   PROCEDURES  Procedure(s) performed (including Critical Care):  Procedures   ____________________________________________   INITIAL IMPRESSION / ASSESSMENT AND PLAN / ED COURSE  As part of my medical decision making, I reviewed the following data within the [facility] notes reviewed and incorporated, Labs reviewed, Radiograph reviewed and Notes from prior ED visits     JAHZEEL POYTHRESS was evaluated in Emergency Department on 11/25/2018 for the symptoms described in the history of present illness. He was evaluated in the context of the global COVID-19 pandemic, which necessitated consideration that the patient might be at risk for infection with the SARS-CoV-2 virus that causes COVID-19. Institutional protocols and algorithms that pertain to the evaluation of patients at risk for COVID-19 are in a state of rapid change based on information released by regulatory bodies including the CDC and federal and state organizations. These policies and algorithms were followed during the patient's care in the ED.   78 year old male who presents 1 day after mechanical fall with right rib/abdominal pain.  Differential diagnosis includes but is not limited to rib fractures, liver laceration, musculoskeletal contusion, etc.  Laboratory results unremarkable.  Will obtain CT scan for further characterization of rib fractures and intra-abdominal injuries.  Clinical Course as of Nov 24 509  Fri Nov 25, 2018  0507 Patient resting in no acute distress.  Updated patient of CT imaging results.  Patient desires to be discharged home.  Will discharge home with Percocet and incentive spirometer.  Strict return precautions given.  Encouraged close follow-up with his PCP.  Patient verbalizes understanding agrees with plan of care.   [JS]    Clinical Course User Index [JS] Paulette Blanch, MD     ____________________________________________   FINAL CLINICAL IMPRESSION(S) / ED DIAGNOSES  Final diagnoses:  Fall, initial encounter  Closed fracture of multiple ribs of right side, initial encounter     ED Discharge Orders    None       Note:  This document was prepared using Dragon voice recognition software and may include unintentional dictation errors.   Paulette Blanch, MD 11/25/18 [phone]

## 2018-11-25 NOTE — ED Notes (Signed)
Pt uprite on stretcher in exam room with no distress noted; pt reports PTA fell over roll bar on lawnmower; c/o pain to rt upper abd/flank and left knee/ankle; resp even/unlab, lungs clear, apical audible & regular, +BS, abd soft/nondist; small abrasion noted to rt knee

## 2018-11-28 ENCOUNTER — Telehealth: Payer: Self-pay | Admitting: *Deleted

## 2018-11-28 NOTE — Telephone Encounter (Signed)
Copied from [place] [id number]. Topic: General - Other >> Nov 28, 2018  7:45 AM Carolyn Stare wrote:  Pt said cancel his appt for 11/29/2018 was upset that he was not coming into the office,

## 2018-11-29 ENCOUNTER — Ambulatory Visit: Payer: Medicare Other | Admitting: Family Medicine

## 2018-12-26 ENCOUNTER — Other Ambulatory Visit: Payer: Self-pay | Admitting: Family Medicine

## 2018-12-29 ENCOUNTER — Telehealth: Payer: Self-pay | Admitting: Family Medicine

## 2018-12-29 NOTE — Telephone Encounter (Signed)
I called pt to confirm appt for Monday. Pt states he wants to be seen in the office I explained to the pt that Dr Caryl Bis is not seeing anyone in the office right now due to Covid. Pt states he wants to be seen and virtual is about money. I explained it is for his safety. Pt did not want to hear that. He also stated he will find him another doctor that he can see in the office. Juliann Pulse offered to speak to pt he did not want to talk to anyone.   Please send to Dr Caryl Bis. Thanks

## 2018-12-30 NOTE — Telephone Encounter (Signed)
Noted. That is fine. We can plan on seeing him in the office as long as he passes the [place] screening questions at the time of his visit.

## 2018-12-30 NOTE — Telephone Encounter (Signed)
Dr. Caryl Bis see the prior note from scheduling.  I called and spoke with the patient and he was fine with the screening and he only wanted to see you.  The patient is scheduled for 01/11/2019 this is your first available morning appt, He stated he broke his ribs a while back and they have healed but he states 2 of them is still pretty tender and that is why he wanted to see you.

## 2019-01-02 ENCOUNTER — Ambulatory Visit: Payer: Medicare Other | Admitting: Family Medicine

## 2019-01-05 ENCOUNTER — Other Ambulatory Visit: Payer: Self-pay

## 2019-01-11 ENCOUNTER — Ambulatory Visit (INDEPENDENT_AMBULATORY_CARE_PROVIDER_SITE_OTHER): Payer: Medicare Other | Admitting: Family Medicine

## 2019-01-11 ENCOUNTER — Other Ambulatory Visit: Payer: Self-pay

## 2019-01-11 ENCOUNTER — Encounter: Payer: Self-pay | Admitting: Family Medicine

## 2019-01-11 ENCOUNTER — Ambulatory Visit (INDEPENDENT_AMBULATORY_CARE_PROVIDER_SITE_OTHER): Payer: Medicare Other

## 2019-01-11 VITALS — BP 140/80 | HR 77 | Temp 97.9°F | Ht 72.0 in | Wt 157.4 lb

## 2019-01-11 DIAGNOSIS — R634 Abnormal weight loss: Secondary | ICD-10-CM

## 2019-01-11 DIAGNOSIS — E785 Hyperlipidemia, unspecified: Secondary | ICD-10-CM | POA: Diagnosis not present

## 2019-01-11 DIAGNOSIS — R8290 Unspecified abnormal findings in urine: Secondary | ICD-10-CM

## 2019-01-11 DIAGNOSIS — S2241XA Multiple fractures of ribs, right side, initial encounter for closed fracture: Secondary | ICD-10-CM | POA: Diagnosis not present

## 2019-01-11 DIAGNOSIS — E7849 Other hyperlipidemia: Secondary | ICD-10-CM

## 2019-01-11 DIAGNOSIS — N4231 Prostatic intraepithelial neoplasia: Secondary | ICD-10-CM

## 2019-01-11 DIAGNOSIS — M79671 Pain in right foot: Secondary | ICD-10-CM

## 2019-01-11 DIAGNOSIS — M791 Myalgia, unspecified site: Secondary | ICD-10-CM

## 2019-01-11 DIAGNOSIS — M79672 Pain in left foot: Secondary | ICD-10-CM

## 2019-01-11 LAB — COMPREHENSIVE METABOLIC PANEL
ALT: 13 U/L (ref 0–53)
AST: 20 U/L (ref 0–37)
Albumin: 4.3 g/dL (ref 3.5–5.2)
Alkaline Phosphatase: 88 U/L (ref 39–117)
BUN: 12 mg/dL (ref 6–23)
CO2: 25 mEq/L (ref 19–32)
Calcium: 9.7 mg/dL (ref 8.4–10.5)
Chloride: 104 mEq/L (ref 96–112)
Creatinine, Ser: 1.14 mg/dL (ref 0.40–1.50)
GFR: 62.5 mL/min (ref >60.00)
Glucose, Bld: 90 mg/dL (ref 70–99)
Potassium: 4.2 mEq/L (ref 3.5–5.1)
Sodium: 140 mEq/L (ref 135–145)
Total Bilirubin: 0.7 mg/dL (ref 0.2–1.2)
Total Protein: 6.2 g/dL (ref 6.0–8.3)

## 2019-01-11 LAB — CBC WITH DIFFERENTIAL/PLATELET
Basophils Absolute: 0.1 10*3/uL (ref 0.0–0.1)
Basophils Relative: 1.4 % (ref 0.0–3.0)
Eosinophils Absolute: 0.2 10*3/uL (ref 0.0–0.7)
Eosinophils Relative: 2.1 % (ref 0.0–5.0)
HCT: 46.6 % (ref 39.0–52.0)
Hemoglobin: 15.1 g/dL (ref 13.0–17.0)
Lymphocytes Relative: 36.4 % (ref 12.0–46.0)
Lymphs Abs: 2.8 10*3/uL (ref 0.7–4.0)
MCHC: 32.5 g/dL (ref 30.0–36.0)
MCV: 91.3 fl (ref 78.0–100.0)
Monocytes Absolute: 0.7 10*3/uL (ref 0.1–1.0)
Monocytes Relative: 8.8 % (ref 3.0–12.0)
Neutro Abs: 3.9 10*3/uL (ref 1.4–7.7)
Neutrophils Relative %: 51.3 % (ref 43.0–77.0)
Platelets: 204 10*3/uL (ref 150.0–400.0)
RBC: 5.1 Mil/uL (ref 4.22–5.81)
RDW: 14.4 % (ref 11.5–15.5)
WBC: 7.6 10*3/uL (ref 4.0–10.5)

## 2019-01-11 LAB — POCT URINALYSIS DIPSTICK
Bilirubin, UA: NEGATIVE
Blood, UA: NEGATIVE
Glucose, UA: NEGATIVE
Leukocytes, UA: NEGATIVE
Nitrite, UA: NEGATIVE
Protein, UA: POSITIVE — AB
Spec Grav, UA: 1.015 (ref 1.010–1.025)
Urobilinogen, UA: 0.2 E.U./dL
pH, UA: 6 (ref 5.0–8.0)

## 2019-01-11 LAB — LIPID PANEL
Cholesterol: 168 mg/dL (ref 0–200)
HDL: 34.7 mg/dL — ABNORMAL LOW (ref >39.00)
LDL Cholesterol: 109 mg/dL — ABNORMAL HIGH (ref 0–99)
NonHDL: 133.23
Total CHOL/HDL Ratio: 5
Triglycerides: 120 mg/dL (ref 0.0–149.0)
VLDL: 24 mg/dL (ref 0.0–40.0)

## 2019-01-11 LAB — SEDIMENTATION RATE: Sed Rate: 5 mm/hr (ref 0–20)

## 2019-01-11 LAB — TSH: TSH: 4.27 u[IU]/mL (ref 0.35–4.50)

## 2019-01-11 MED ORDER — CLONAZEPAM 2 MG PO TABS: 2.0000 mg | ORAL_TABLET | Freq: Three times a day (TID) | ORAL | 1 refills | Status: DC | PRN

## 2019-01-11 NOTE — Assessment & Plan Note (Signed)
Weight is down about 15 pounds since March per our readings.  His weight is only down about 3 pounds over the last month and a half.  I suspect this is related to his drastic change in diet and the fact that he was ill previously.  We will obtain lab work to evaluate for potential other causes.  He did have CT chest, abdomen, and pelvis recently with no obvious contributing factor noted with regards to weight loss.  Encouraged higher caloric food intake.

## 2019-01-11 NOTE — Assessment & Plan Note (Signed)
Progressively improving.  We will repeat an x-ray today to check for healing.  Encouraged him to minimize activities that cause discomfort in this area.

## 2019-01-11 NOTE — Progress Notes (Signed)
[name], MD Phone: [phone]  Timothy Turner is a 78 y.o. male who presents today for follow-up.  Rib fractures: Patient had a fall about 7 weeks ago where he fell onto the mower on his right side of his ribs.  He was evaluated in the emergency department.  He had fractures of ribs 8 through 11 on the right.  Notes the pain has improved quite a bit and is 80% of what it was.  It is a 1 out of 10.  He notes twisting does still hurt.  No shortness of breath, fevers, or cough.  Muscle cramps: Patient tried to taper down on the clonazepam though he started to develop aching when he got down to  lower doses.  He has started back on 2 mg 3 times a day and that has resolved his aching.  He resumed this dosing in May.  He notes no drowsiness with the clonazepam.  No alcohol intake.  Feet pain: Patient notes the bilateral balls of his feet have become somewhat uncomfortable particularly when he walks.  Describes it as a burning discomfort.  He notes the skin is loose.  He has seen podiatry for this and they gave him a foot pad which does help.  He does note if he walks he will get numbness in the bottom of his feet.  Ice helps with this.  Hyperlipidemia: He wants to recheck his lipids.  He has changed his diet drastically over the last year.  He is eating mostly fruits and vegetables.  Weight loss: Patient reports he is down about 20 pounds since March.  He has not been trying to lose weight.  He did have some fairly significant diet changes about a year ago.  He has not been overly active since he broke his ribs.  He denies night sweats, fevers, abdominal pain, cough, shortness of breath, skin changes, blood in his stool, hematuria, and itching.  He does note he was sick in February with COVID-like symptoms.  He has recovered from that.  Recent CT chest, abdomen, and pelvis with no obvious contributing factors to his weight loss.  Social History   Tobacco Use  Smoking Status Former  Smoker  . Packs/day: 1.00  . Years: 30.00  . Pack years: 30.00  . Types: Cigarettes  . Quit date: 06/22/2004  . Years since quitting: 14.5  Smokeless Tobacco Never Used     ROS see history of present illness  Objective  Physical Exam Vitals:   01/11/19 1024  BP: 140/80  Pulse: 77  Temp: 97.9 F (36.6 C)  SpO2: 97%    BP Readings from Last 3 Encounters:  01/11/19 140/80  11/25/18 140/70  08/30/18 98/70   Wt Readings from Last 3 Encounters:  01/11/19 157 lb 6.4 oz (71.4 kg)  11/25/18 160 lb (72.6 kg)  08/30/18 172 lb 12.8 oz (78.4 kg)    Physical Exam Constitutional:      General: He is not in acute distress.    Appearance: He is not diaphoretic.  HENT:     Head: Normocephalic and atraumatic.  Cardiovascular:     Rate and Rhythm: Normal rate and regular rhythm.     Heart sounds: Normal heart sounds.  Pulmonary:     Effort: Pulmonary effort is normal.     Breath sounds: Normal breath sounds.    Skin:    General: Skin is warm and dry.  Neurological:     Mental Status: He is alert.  Assessment/Plan: Please see individual problem list.  Multiple closed fractures of ribs of right side Progressively improving.  We will repeat an x-ray today to check for healing.  Encouraged him to minimize activities that cause discomfort in this area.  High grade prostatic intraepithelial neoplasia He will have his PSA collected by urology in the next several weeks.  Weight loss Weight is down about 15 pounds since March per our readings.  His weight is only down about 3 pounds over the last month and a half.  I suspect this is related to his drastic change in diet and the fact that he was ill previously.  We will obtain lab work to evaluate for potential other causes.  He did have CT chest, abdomen, and pelvis recently with no obvious contributing factor noted with regards to weight loss.  Encouraged higher caloric food intake.  Familial multiple lipoprotein-type  hyperlipidemia Check lipids.  Foot pain, bilateral I encouraged the patient to discuss the possibility of custom orthotics with his podiatrist.  He will continue to follow with podiatry.  Myalgia He will continue clonazepam 2 mg 3 times daily for muscle pains.  Well-controlled on this regimen.  Controlled substance database reviewed.  Refill given.   Orders Placed This Encounter  Procedures  . DG Ribs Unilateral W/Chest Right    Standing Status:   Future    Number of Occurrences:   1    Standing Expiration Date:   03/13/2020    Order Specific Question:   Reason for Exam (SYMPTOM  OR DIAGNOSIS REQUIRED)    Answer:   follow-up right rib fractures, persistent pain    Order Specific Question:   Preferred imaging location?    Answer:   [facility] Specific Question:   Radiology Contrast Protocol - do NOT remove file path    Answer:   _0 [email or web address]  . Comp Met (CMET)  . CBC with Differential/Platelet  . TSH  . Sedimentation rate  . Lipid panel  . Urine Microscopic Only  . POCT urinalysis dipstick    Meds ordered this encounter  Medications  . clonazePAM (KLONOPIN) 2 MG tablet    Sig: Take 1 tablet (2 mg total) by mouth 3 (three) times daily as needed (severe muscle spasm).    Dispense:  90 tablet    Refill:  1     [name], MD [place]

## 2019-01-11 NOTE — Assessment & Plan Note (Signed)
He will have his PSA collected by urology in the next several weeks.

## 2019-01-11 NOTE — Assessment & Plan Note (Signed)
He will continue clonazepam 2 mg 3 times daily for muscle pains.  Well-controlled on this regimen.  Controlled substance database reviewed.  Refill given.

## 2019-01-11 NOTE — Assessment & Plan Note (Signed)
I encouraged the patient to discuss the possibility of custom orthotics with his podiatrist.  He will continue to follow with podiatry.

## 2019-01-11 NOTE — Patient Instructions (Signed)
Nice to see you. We will get labs and an x-ray today. Please check with your podiatrist regarding possibility of custom orthotics for your feet.

## 2019-01-11 NOTE — Assessment & Plan Note (Signed)
Check lipids 

## 2019-01-12 LAB — URINALYSIS, MICROSCOPIC ONLY: RBC / HPF: NONE SEEN (ref 0–?)

## 2019-01-18 ENCOUNTER — Telehealth: Payer: Self-pay

## 2019-01-18 ENCOUNTER — Other Ambulatory Visit: Payer: Self-pay | Admitting: Family Medicine

## 2019-01-18 DIAGNOSIS — R809 Proteinuria, unspecified: Secondary | ICD-10-CM

## 2019-01-18 DIAGNOSIS — S2231XG Fracture of one rib, right side, subsequent encounter for fracture with delayed healing: Secondary | ICD-10-CM

## 2019-01-18 NOTE — Telephone Encounter (Signed)
Copied from [place] [id number]. Topic: General - Other >> Jan 18, 2019 11:12 AM Richardo Priest, NT wrote: Reason for CRM: Patient called in stating he is waiting for a call back in regards to office calling to schedule an appointment with a surgeon for his ribs. Call back is [phone].

## 2019-01-19 ENCOUNTER — Other Ambulatory Visit: Payer: Self-pay

## 2019-01-19 DIAGNOSIS — R972 Elevated prostate specific antigen [PSA]: Secondary | ICD-10-CM

## 2019-01-19 NOTE — Telephone Encounter (Addendum)
I have placed this for Timothy Turner now. Patient is requesting a referral to Dr Rudene Christians. Can you send this today for them to try to get the patient in given delayed healing of rib fractures.

## 2019-01-19 NOTE — Addendum Note (Signed)
Addended by: Leone Haven on: 01/19/2019 08:43 AM   Modules accepted: Orders

## 2019-01-19 NOTE — Addendum Note (Signed)
Addended by: Leone Haven on: 01/19/2019 08:32 AM   Modules accepted: Orders

## 2019-01-19 NOTE — Telephone Encounter (Signed)
No referral has been placed for his ribs and nothing noted in your office note regarding referring him. Do you want me to enter a referral for him for ortho? Melissa

## 2019-01-20 ENCOUNTER — Other Ambulatory Visit: Payer: Medicare Other

## 2019-01-20 ENCOUNTER — Other Ambulatory Visit: Payer: Self-pay

## 2019-01-20 DIAGNOSIS — N4232 Atypical small acinar proliferation of prostate: Secondary | ICD-10-CM

## 2019-01-20 DIAGNOSIS — E291 Testicular hypofunction: Secondary | ICD-10-CM

## 2019-01-20 NOTE — Telephone Encounter (Signed)
Pt is calling back and said dr Rudene Christians does not do ribs according to his nurse. Nurse said she will follow up with dr Rudene Christians and will call patient back

## 2019-01-21 LAB — TESTOSTERONE: Testosterone: 529 ng/dL (ref 264–916)

## 2019-01-21 LAB — HEMATOCRIT: Hematocrit: 45.5 % (ref 37.5–51.0)

## 2019-01-21 LAB — PSA: Prostate Specific Ag, Serum: 1 ng/mL (ref 0.0–4.0)

## 2019-01-23 ENCOUNTER — Telehealth: Payer: Self-pay | Admitting: *Deleted

## 2019-01-23 NOTE — Telephone Encounter (Signed)
Pt is calling office and wanting to know a surgeons name or referral.

## 2019-01-23 NOTE — Telephone Encounter (Addendum)
Patient requesting referral to go too Dr. Felicie Morn, MD thoracic surgeon please fax referral, office notes and patient demographic information to  fax# [phone] and phone # [phone]

## 2019-01-23 NOTE — Telephone Encounter (Addendum)
Patient informed-verbalized understanding.   ----- Message from Abbie Sons, MD sent at 01/22/2019 11:57 AM EDT ----- Testosterone level was 529.  PSA 1.0.  Hematocrit was normal.  Follow-up as scheduled

## 2019-01-24 ENCOUNTER — Telehealth: Payer: Self-pay

## 2019-01-24 MED ORDER — FINASTERIDE 5 MG PO TABS: 5.0000 mg | ORAL_TABLET | Freq: Every day | ORAL | 3 refills | Status: DC

## 2019-01-24 NOTE — Telephone Encounter (Signed)
Finasteride Rx refill sent to pharmacy

## 2019-01-25 NOTE — Telephone Encounter (Signed)
Referral faxed to number provided below

## 2019-03-07 ENCOUNTER — Telehealth: Payer: Self-pay | Admitting: Family Medicine

## 2019-03-07 DIAGNOSIS — E785 Hyperlipidemia, unspecified: Secondary | ICD-10-CM

## 2019-03-07 NOTE — Telephone Encounter (Signed)
Form received stating that his insurance will not pay for his LDL.  Patient has statin intolerance with muscle aches.  Please contact the patient and see if he remembers ever trying Lipitor, pravastatin, or simvastatin.  If he has not tried 1 of those we could trial low-dose and see if he can tolerate it.

## 2019-03-07 NOTE — Telephone Encounter (Signed)
I am going to forward this to Catie to get her input. From what I know about cholestyramine it is typically used for hypertriglyceridemia and not elevated LDL, though I will see if Catie can give some guidance on this. Patient has been intolerant of multiple statins as outlined in prior message and his insurance will not pay for zetia any more. May need to consider PCSK9 inhibitor if it is affordable. Thanks.

## 2019-03-07 NOTE — Telephone Encounter (Signed)
Patient stated he has tried all of those before and he did not want to try them again, he states he did some research on a medication called cholestyramine oil suspension powder and he want you to look at that medication and see how you feel and if so he is willing to give that a try.  Mizael Sagar,cma

## 2019-03-08 NOTE — Telephone Encounter (Addendum)
Did the insurance give a reason why they wouldn't pay for ezetimibe? That's very odd to me, as it's generic now.   Bile acid sequestrants like cholestyramine could be a third line recommendation after trying statins and ezetimibe, as long as TG are <300, which they are.   However, his diagnosis of PVD may count as ASCVD to qualify for PCSK9 therapy.

## 2019-03-09 NOTE — Telephone Encounter (Signed)
The form that I received did not give a reason for why that would not pay for the Zetia.  I will have Timothy Turner contact him to see if he would be willing to try the injectable PCSK9 therapy.  If he would not consider that then we could consider the cholestyramine.

## 2019-03-09 NOTE — Telephone Encounter (Signed)
I called and spoke with the patient and he stated that he had tried the injectable in the past and it also caused musckle pain, patient states he is willing to try the cholestytamine.  Nina,cma

## 2019-03-16 MED ORDER — CHOLESTYRAMINE 4 G PO PACK: 4.0000 g | PACK | Freq: Two times a day (BID) | ORAL | 3 refills | Status: DC

## 2019-03-16 NOTE — Telephone Encounter (Signed)
Called and left a voicemail stating that the medication was at the local pharmacy and he needs a 6 week lab appointment. I also stated for the patient to call back to schedule labs and if needed change pharmacy to mail order.  Kasch Borquez,cma

## 2019-03-16 NOTE — Addendum Note (Signed)
Addended by: Leone Haven on: 03/16/2019 08:54 AM   Modules accepted: Orders

## 2019-03-16 NOTE — Telephone Encounter (Signed)
I will send the cholestyramine to his pharmacy. I sent it to his local pharmacy. If he wants it sent to his mail order please do this. He will need a repeat lipid panel in 6 weeks. Orders have been placed.

## 2019-03-20 NOTE — Telephone Encounter (Signed)
Called and lm for the patient to call the office to schedule a lab appointment in 6 weeks.  Nina,cma

## 2019-03-23 ENCOUNTER — Encounter: Payer: Self-pay | Admitting: Family Medicine

## 2019-03-23 NOTE — Telephone Encounter (Signed)
Patient has appointment scheduled.  Timothy Turner,cma

## 2019-03-24 ENCOUNTER — Other Ambulatory Visit: Payer: Self-pay | Admitting: Family Medicine

## 2019-04-07 ENCOUNTER — Other Ambulatory Visit: Payer: Medicare Other

## 2019-04-30 NOTE — Progress Notes (Signed)
Cardiology Office Note  Date:  05/01/2019   ID:  HARWOOD DINSMOOR, DOB 10-11-1940, MRN [id number]  PCP:  Leone Haven, MD   Chief Complaint  Patient presents with  . other    1 year follow up. Meds reviewed by the pt. verbally. "doing well."     HPI:  [name] is a  78 year-old gentleman with past medical history of smoking history, PAD  moderate bilateral iliac arterial disease in 2015, done at [facility] mitral valve prolapse  echocardiogram in 2009 reporting moderate to severe MR (outside study ),  hyperlipidemia,  Long prior smoking history for at least 30 years, stopped when he was in his late 70s hypertension with diffuse chronic muscle aches of uncertain etiology Cardiac catheterization September 2016 No significant coronary disease who presents for routine followup of his PAD, MR.  Less walking Feet hurt, seen by podiatry Wearing special shoes  Eating beets, helps muscle pain Has had long history of chronic muscle pain  Had a fall 11/2018, hurt foot Had surgery Sedentary, afraid of falling  No claudication pain Muscle pain is stable  Might need to stop his Zetia as insurance will not cover anymore  Was on PCSK9 inh, caused side effects, muscle ache,  stopped the medication   Discussed lower extremity arterial Dopplers with him from November 2019 Lower extremity venous Doppler results from May 2019 reviewed with him showing occlusion of popliteal vessel Moderate iliac disease bilaterally  EKG personally reviewed by myself on todays visit Shows normal sinus rhythm rate 86 bpm no significant ST or T wave changes  Other past medical history reviewed   Fall, subsequent knee trauma, April 2019 Was very sedentary for 1 month secondary to recovering from the trauma Developed DVT and pulmonary embolism, to the emergency room with acute shortness of breath May 2019 Left PE on CT scan 11/11/2017 Started on eliquis  Long history of muscle cramping  in his legs improved on clonazepam started years ago Unable to tolerate statins secondary to muscle pain  Echocardiogram May 2019 ejection fraction 60 to 65%, mild MR, mildly elevated right heart pressures, moderately dilated left atrium  Cardiac catheterization September 2016 No significant coronary disease to explain shortness of breath, ejection fraction 55% Suspected to have COPD and PFTs were recommended   PMH:   has a past medical history of Aspiration precautions, Barrett esophagus, Depression, Gallstones, GERD (gastroesophageal reflux disease), Heart murmur, Hemorrhoids, Hypertension, Hypothyroidism, Iron deficiency anemia (02/10/2012), Mitral valve disease, Myalgia, Neuromuscular disorder ([place]), PAD (peripheral artery disease) ([place]), Pain syndrome, chronic, Pneumonia, Psoriasis, Pulmonary embolism ([place]) (2019), Rheumatic heart valve incompetence, Shortness of breath dyspnea, and Thyroiditis.  PSH:    Past Surgical History:  Procedure Laterality Date  . BACK SURGERY    . CARDIAC CATHETERIZATION N/A 02/22/2015   Procedure: Left Heart Cath and Coronary Angiography;  Surgeon: Minna Merritts, MD;  Location: [place] [facility];  Service: Cardiovascular;  Laterality: N/A;  . CATARACT EXTRACTION W/PHACO Left 07/29/2016   Procedure: CATARACT EXTRACTION PHACO AND INTRAOCULAR LENS PLACEMENT (IOC)  Left eye IVA Topical;  Surgeon: Leandrew Koyanagi, MD;  Location: [facility];  Service: Ophthalmology;  Laterality: Left;  . CATARACT EXTRACTION W/PHACO Right 08/19/2016   Procedure: CATARACT EXTRACTION PHACO AND INTRAOCULAR LENS PLACEMENT ([place]) right  TORIC;  Surgeon: Leandrew Koyanagi, MD;  Location: [place];  Service: Ophthalmology;  Laterality: Right;  prefers early morning  . CHOLECYSTECTOMY  08/03/13  . COLONOSCOPY  03-08-12   Dr Donnella Sham  .  COLONOSCOPY WITH PROPOFOL N/A 06/17/2017   Procedure: COLONOSCOPY WITH PROPOFOL;  Surgeon: [name], MD;  Location: [facility] ENDOSCOPY;   Service: Gastroenterology;  Laterality: N/A;  . DIRECT LARYNGOSCOPY N/A 02/17/2016   Procedure: DIRECT LARYNGOSCOPY;  Surgeon: Jodi Marble, MD;  Location: [place];  Service: ENT;  Laterality: N/A;  . ESOPHAGEAL MANOMETRY N/A 02/27/2015   Procedure: ESOPHAGEAL MANOMETRY (EM);  Surgeon: Lollie Sails, MD;  Location: [facility] ENDOSCOPY;  Service: Endoscopy;  Laterality: N/A;  . ESOPHAGOGASTRODUODENOSCOPY (EGD) WITH PROPOFOL N/A 02/18/2015   Procedure: ESOPHAGOGASTRODUODENOSCOPY (EGD) WITH PROPOFOL;  Surgeon: Lollie Sails, MD;  Location: [facility] ENDOSCOPY;  Service: Endoscopy;  Laterality: N/A;  . ESOPHAGOGASTRODUODENOSCOPY (EGD) WITH PROPOFOL N/A 06/17/2017   Procedure: ESOPHAGOGASTRODUODENOSCOPY (EGD) WITH PROPOFOL;  Surgeon: [name], MD;  Location: [facility] ENDOSCOPY;  Service: Gastroenterology;  Laterality: N/A;  . ESOPHAGOSCOPY W/ BOTOX INJECTION N/A 02/17/2016   Procedure: ESOPHAGOSCOPY WITH BOTOX INJECTION  ;  Surgeon: Jodi Marble, MD;  Location: [place];  Service: ENT;  Laterality: N/A;  . HEMORROIDECTOMY    . UPPER GI ENDOSCOPY  03-06-2013   Dr Donnella Sham    Current Outpatient Medications  Medication Sig Dispense Refill  . azelastine (ASTELIN) 0.1 % nasal spray Place 2 sprays into both nostrils 2 (two) times daily. Use in each nostril as directed 30 mL 6  . calcipotriene (DOVONOX) 0.005 % cream Apply topically 2 (two) times daily.     . cetirizine (ZYRTEC) 10 MG tablet Take 10 mg by mouth daily.    . Cholecalciferol (VITAMIN D3) 25 MCG (1000 UT) CAPS Take by mouth daily.     . clobetasol (TEMOVATE) 0.05 % cream Apply topically as needed. Reported on 12/03/2015    . clonazePAM (KLONOPIN) 2 MG tablet Take 1 tablet (2 mg total) by mouth 3 (three) times daily as needed (severe muscle spasm). 90 tablet 1  . colestipol (COLESTID) 1 G tablet Take 3 tablets (3 g total) by mouth 2 (two) times daily. 540 tablet 3  . escitalopram (LEXAPRO) 10 MG tablet Take 10 mg  by mouth at bedtime.     [name] esomeprazole (NEXIUM) 40 MG capsule TAKE 1 CAPSULE DAILY 90 capsule 3  . ezetimibe (ZETIA) 10 MG tablet TAKE 1 TABLET DAILY 90 tablet 4  . finasteride (PROSCAR) 5 MG tablet Take 1 tablet (5 mg total) by mouth daily. 90 tablet 3  . Glucosamine-Chondroitin 250-200 MG TABS Take by mouth daily.     [name] levothyroxine (SYNTHROID) 75 MCG tablet TAKE 1 TABLET DAILY 90 tablet 3  . metoprolol tartrate (LOPRESSOR) 25 MG tablet Take 25 mg by mouth 2 (two) times daily as needed.     . Multiple Vitamins-Minerals (CENTRUM SILVER PO) Take 1 tablet by mouth every evening.     . Plant Sterols and Stanols 450 MG CAPS Take by mouth 2 (two) times daily.    [name] RESVERATROL PO Take 1,000 mg by mouth 2 (two) times daily.    [name] oxyCODONE-acetaminophen (PERCOCET/ROXICET) 5-325 MG tablet Take 1 tablet by mouth every 4 (four) hours as needed for severe pain. 20 tablet 0   No current facility-administered medications for this visit.      Allergies:   Duloxetine, Nsaids, Rosuvastatin, Statins, and Tolmetin   Social History:  The patient  reports that he quit smoking about 14 years ago. His smoking use included cigarettes. He has a 30.00 pack-year smoking history. He has never used smokeless tobacco. He reports that he does not drink alcohol or  use drugs.   Family History:   family history includes Aneurysm in his sister; Cancer in his sister; Emphysema in his father; Heart failure in his father and mother.    Review of Systems: Review of Systems  Constitutional: Negative.   Respiratory: Negative.   Cardiovascular: Negative.   Gastrointestinal: Negative.   Musculoskeletal: Positive for myalgias.       Foot pain  Neurological: Negative.   Psychiatric/Behavioral: Negative.   All other systems reviewed and are negative.    PHYSICAL EXAM: VS:  BP 122/80 (BP Location: Left Arm, Patient Position: Sitting, Cuff Size: Normal)   Pulse 86   Temp (!) 97.2 F (36.2 C)   Ht 6' (1.829 m)   Wt 160  lb 8 oz (72.8 kg)   BMI 21.77 kg/m  , BMI Body mass index is 21.77 kg/m. Constitutional:  oriented to person, place, and time. No distress.  HENT:  Head: Grossly normal Eyes:  no discharge. No scleral icterus.  Neck: No JVD, no carotid bruits  Cardiovascular: Regular rate and rhythm, no murmurs appreciated Pulmonary/Chest: Clear to auscultation bilaterally, no wheezes or rails Abdominal: Soft.  no distension.  no tenderness.  Musculoskeletal: Normal range of motion Neurological:  normal muscle tone. Coordination normal. No atrophy Skin: Skin warm and dry Psychiatric: normal affect, pleasant   Recent Labs: 01/11/2019: ALT 13; BUN 12; Creatinine, Ser 1.14; Hemoglobin 15.1; Platelets 204.0; Potassium 4.2; Sodium 140; TSH 4.27    Lipid Panel Lab Results  Component Value Date   CHOL 168 01/11/2019   HDL 34.70 (L) 01/11/2019   LDLCALC 109 (H) 01/11/2019   TRIG 120.0 01/11/2019    Wt Readings from Last 3 Encounters:  05/01/19 160 lb 8 oz (72.8 kg)  01/11/19 157 lb 6.4 oz (71.4 kg)  11/25/18 160 lb (72.6 kg)     ASSESSMENT AND PLAN:  History of pulmonary embolism with acute cor pulmonale, unspecified pulmonary embolism type (HCC) Provoked DVT lower extremity, pulmonary embolism on the left documented on CT scan Echocardiogram with right heart strain Stopped anticoagulation after 6 months, in November [id number] No complications since that time  PVD (peripheral vascular disease) ([place]) Prior history of moderate iliac disease Ultrasound ordered for next year at his request, 2 years from prior study  Chronic obstructive pulmonary disease, unspecified COPD type ([place]) Breathing stable, recommend regular walking program  Chronic kidney disease (CKD), stage III (moderate) (HCC) Creatinine 1.5, stable  Hyperlipidemia Coupon printed up for Zetia to avoid work-up from insurance Talked about new medication such as bempedoic acid  Disposition:   F/U  12 months   Total encounter time  more than 25 minutes  Greater than 50% was spent in counseling and coordination of care with the patient   No orders of the defined types were placed in this encounter.    Signed, Esmond Plants, M.D., Ph.D. 05/01/2019  [facility] [place], [place] [phone] Ms. Sarita Haver was 1 but there were a couple I changed a couple on her and couple on other people

## 2019-05-01 ENCOUNTER — Ambulatory Visit (INDEPENDENT_AMBULATORY_CARE_PROVIDER_SITE_OTHER): Payer: Medicare Other | Admitting: Cardiovascular Disease

## 2019-05-01 ENCOUNTER — Telehealth: Payer: Self-pay | Admitting: Cardiovascular Disease

## 2019-05-01 ENCOUNTER — Encounter: Payer: Self-pay | Admitting: Cardiovascular Disease

## 2019-05-01 ENCOUNTER — Other Ambulatory Visit: Payer: Self-pay

## 2019-05-01 VITALS — BP 122/80 | HR 86 | Temp 97.2°F | Ht 72.0 in | Wt 160.5 lb

## 2019-05-01 DIAGNOSIS — I2609 Other pulmonary embolism with acute cor pulmonale: Secondary | ICD-10-CM | POA: Diagnosis not present

## 2019-05-01 DIAGNOSIS — J449 Chronic obstructive pulmonary disease, unspecified: Secondary | ICD-10-CM | POA: Diagnosis not present

## 2019-05-01 DIAGNOSIS — I739 Peripheral vascular disease, unspecified: Secondary | ICD-10-CM

## 2019-05-01 DIAGNOSIS — N183 Chronic kidney disease, stage 3 unspecified: Secondary | ICD-10-CM | POA: Diagnosis not present

## 2019-05-01 MED ORDER — EZETIMIBE 10 MG PO TABS: 10.0000 mg | ORAL_TABLET | Freq: Every day | ORAL | 4 refills | Status: DC

## 2019-05-01 NOTE — Telephone Encounter (Signed)
Error

## 2019-05-01 NOTE — Patient Instructions (Addendum)
Read about NEXLIZET and Nexlitol Bempidoic acid  Medication Instructions:  No changes  If you need a refill on your cardiac medications before your next appointment, please call your pharmacy.    Lab work: No new labs needed   If you have labs (blood work) drawn today and your tests are completely normal, you will receive your results only by: [name] MyChart Message (if you have MyChart) OR . A paper copy in the mail If you have any lab test that is abnormal or we need to change your treatment, we will call you to review the results.   Testing/Procedures: Your physician has requested that you have a lower extremity arterial duplex- 1 year. This test is an ultrasound of the arteries in the legs. It looks at arterial blood flow in the legs. Allow one hour for Lower Arterial scans. There are no restrictions or special instructions   Follow-Up: At [facility], you and your health needs are our priority.  As part of our continuing mission to provide you with exceptional heart care, we have created designated Provider Care Teams.  These Care Teams include your primary Cardiologist (physician) and Advanced Practice Providers (APPs -  Physician Assistants and Nurse Practitioners) who all work together to provide you with the care you need, when you need it.  . You will need a follow up appointment in 12 months after ultrasound   . Providers on your designated Care Team:   . Murray Hodgkins, NP . Christell Faith, PA-C . Marrianne Mood, PA-C  Any Other Special Instructions Will Be Listed Below (If Applicable).  For educational health videos Log in to : [email or web address] Or : [email or web address], password : [email or web address]

## 2019-05-01 NOTE — Telephone Encounter (Signed)
Patient calling Had an appointment with Dr Rockey Situ today States that insurance does accept Nexletol medication and will need a prior auth sent Would like a 90 day supply sent to [facility] Please call with any questions or concerns

## 2019-05-01 NOTE — Telephone Encounter (Signed)
To Dr. Rockey Situ to review for dosing.

## 2019-05-05 NOTE — Telephone Encounter (Signed)
We can send in a prescription for Nexletol 180 mg daily Would stay on the Zetia I can help fill out preauthorization form Statin intolerant Numbers not at goal given coronary disease PAD

## 2019-05-10 MED ORDER — NEXLETOL 180 MG PO TABS: 180.0000 mg | ORAL_TABLET | Freq: Every day | ORAL | 3 refills | Status: DC

## 2019-05-10 NOTE — Telephone Encounter (Signed)
Spoke with patient and reviewed that we would send in prescription for Nexletol 180 mg once daily and that once they receive request they will then send [place] prior authorization request to complete. Advised this can sometimes take a week or so to get completed before he gets response. He verbalized understanding and was appreciative for the update on this process.

## 2019-05-17 ENCOUNTER — Ambulatory Visit: Payer: Medicare Other | Admitting: Family Medicine

## 2019-05-20 ENCOUNTER — Other Ambulatory Visit: Payer: Self-pay | Admitting: Family Medicine

## 2019-05-20 DIAGNOSIS — M791 Myalgia, unspecified site: Secondary | ICD-10-CM

## 2019-05-22 NOTE — Telephone Encounter (Signed)
Refilled: 01/11/2019 Last OV: 01/11/2019 Next OV: 09/01/2019

## 2019-05-25 ENCOUNTER — Telehealth: Payer: Self-pay | Admitting: Cardiovascular Disease

## 2019-05-25 NOTE — Telephone Encounter (Signed)
Please see note below. 

## 2019-05-25 NOTE — Telephone Encounter (Signed)
°*  STAT* If patient is at the pharmacy, call can be transferred to refill team.   1. Which medications need to be refilled? (please list name of each medication and dose if known) Bempedoic Acid (nexletol) 180 MG  2. Which pharmacy/location (including street and city if local pharmacy) is medication to be sent to? [facility]   3. Do they need a 30 day or 90 day supply? 90 day   Patient calling, states that medication needs a prior auth at [id number]

## 2019-05-26 NOTE — Telephone Encounter (Signed)
I forwarded to [facility] because I know she had the PA paper application that was sent by fax to the office and she was working on it. I'm not sure what she done with the PA they faxed to the office.

## 2019-05-26 NOTE — Telephone Encounter (Signed)
I just need to confirm if she's already initiated the PA because I don't want to start a new PA if she's already started it.

## 2019-05-30 NOTE — Telephone Encounter (Signed)
Forms on Dr. Donivan Scull desk for review and signature.

## 2019-05-31 NOTE — Telephone Encounter (Signed)
Form completed and will fax to insurance company

## 2019-06-01 NOTE — Telephone Encounter (Signed)
Per fax from [place], nexletol has been approved effective 05/01/2019 through 05/30/2020. Patient and pharmacy have been informed.

## 2019-06-02 ENCOUNTER — Other Ambulatory Visit: Payer: Self-pay

## 2019-06-02 ENCOUNTER — Ambulatory Visit (INDEPENDENT_AMBULATORY_CARE_PROVIDER_SITE_OTHER): Payer: Medicare Other | Admitting: Podiatry

## 2019-06-02 ENCOUNTER — Encounter: Payer: Self-pay | Admitting: Podiatry

## 2019-06-02 ENCOUNTER — Telehealth: Payer: Self-pay | Admitting: Family Medicine

## 2019-06-02 DIAGNOSIS — M7742 Metatarsalgia, left foot: Secondary | ICD-10-CM | POA: Diagnosis not present

## 2019-06-02 DIAGNOSIS — M7741 Metatarsalgia, right foot: Secondary | ICD-10-CM

## 2019-06-02 DIAGNOSIS — M2580 Other specified joint disorders, unspecified joint: Secondary | ICD-10-CM

## 2019-06-02 NOTE — Telephone Encounter (Signed)
Please create a handicap placard application for this patient.

## 2019-06-02 NOTE — Telephone Encounter (Signed)
Handicap Placard started and in the signed basket.  Patrena Santalucia,cma

## 2019-06-06 NOTE — Progress Notes (Signed)
HPI: 78 year old male presents to the office today for follow up evaluation of bilateral forefoot pain. He states the pain returned about two months ago after returning to work from having rib surgery in June. He states the pain is not as bad as it was initially. Walking makes it worse. He has not had any recent treatment for the symptoms. Patient is here for further evaluation and treatment.   Past Medical History:  Diagnosis Date  . Aspiration precautions    uses thicken in liquids.  Eats pureed food.  . Barrett esophagus   . Depression   . Gallstones   . GERD (gastroesophageal reflux disease)   . Heart murmur    History of  . Hemorrhoids   . Hypertension   . Hypothyroidism   . Iron deficiency anemia 02/10/2012  . Mitral valve disease   . Myalgia    Multiple  . Neuromuscular disorder ([place])   . PAD (peripheral artery disease) ([facility])    lower legs  . Pain syndrome, chronic    Severe  . Pneumonia    three times, last time >10 years ago  . Psoriasis   . Pulmonary embolism ([place]) 2019   left lung  . Rheumatic heart valve incompetence   . Shortness of breath dyspnea   . Thyroiditis      Physical Exam: General: The patient is alert and oriented x3 in no acute distress.  Dermatology: Skin is warm, dry and supple bilateral lower extremities. Negative for open lesions or macerations.  Vascular: Palpable pedal pulses bilaterally. No edema or erythema noted. Capillary refill within normal limits.  Neurological: Epicritic and protective threshold grossly intact bilaterally.   Musculoskeletal Exam: Range of motion within normal limits to all pedal and ankle joints bilateral. Muscle strength 5/5 in all groups bilateral.  Pain on palpation and range of motion to the bilateral forefoot consistent with metatarsalgia. Pain with palpation to the bilateral sesamoid apparatus.   Assessment: 1. Bilateral forefoot metatarsalgia 2. Sesamoiditis bilateral    Plan of Care:  1. Patient  evaluated.  2. Injection of 0.5 mLs of Celestone Soluspan injected into the sesamoidal apparatus bilaterally. 3. Patient cannot tolerate oral NSAIDs secondary to Barrett's esophagus 4. Met pads dispensed.  5. Recommended good shoe gear.  6. Return to clinic as needed.      Edrick Kins, DPM [facility]  Dr. Edrick Kins, DPM    [address], [place] [address]                Office [phone]  Fax [phone]

## 2019-06-08 NOTE — Telephone Encounter (Signed)
Please contact the patient and confirm the reason he needs a handicap placard. Is he able to walk 200 feet without stopping to rest? Does he require a cane or walker to walk?

## 2019-06-09 NOTE — Telephone Encounter (Signed)
I called and spoke with patient and he cannot walk more than 200 feet without stopping, because he gives out of breath.  No cane or walker.  Cerita Rabelo,cma

## 2019-06-11 NOTE — Telephone Encounter (Signed)
Form completed.  Please make available for pickup.

## 2019-06-12 NOTE — Telephone Encounter (Signed)
Patient picked up the handicap application this morning in person at front desk.  Tyerra Loretto,cma

## 2019-06-26 ENCOUNTER — Telehealth: Payer: Self-pay | Admitting: Family Medicine

## 2019-06-26 NOTE — Telephone Encounter (Signed)
Pt now needs all Rx to go to   [facility] [place] [facility] [place] [facility]  Phone:  [phone] Fax:  [phone]  And [facility] [address] for other rx

## 2019-06-27 ENCOUNTER — Telehealth: Payer: Self-pay

## 2019-06-27 NOTE — Telephone Encounter (Signed)
Updated the patient's pharmacy.  Timothy Turner,cma

## 2019-06-27 NOTE — Telephone Encounter (Signed)
Noted.  Please make sure this is updated in his pharmacy selection in the chart.  Thanks.

## 2019-07-12 ENCOUNTER — Other Ambulatory Visit: Payer: Self-pay

## 2019-07-12 DIAGNOSIS — R972 Elevated prostate specific antigen [PSA]: Secondary | ICD-10-CM

## 2019-07-12 DIAGNOSIS — E291 Testicular hypofunction: Secondary | ICD-10-CM

## 2019-07-13 ENCOUNTER — Other Ambulatory Visit: Payer: Medicare Other

## 2019-07-13 ENCOUNTER — Other Ambulatory Visit: Payer: Self-pay

## 2019-07-13 DIAGNOSIS — R972 Elevated prostate specific antigen [PSA]: Secondary | ICD-10-CM

## 2019-07-13 DIAGNOSIS — E291 Testicular hypofunction: Secondary | ICD-10-CM

## 2019-07-14 ENCOUNTER — Telehealth: Payer: Self-pay | Admitting: *Deleted

## 2019-07-14 LAB — PSA: Prostate Specific Ag, Serum: 0.9 ng/mL (ref 0.0–4.0)

## 2019-07-14 LAB — HEMOGLOBIN AND HEMATOCRIT, BLOOD
Hematocrit: 45.3 % (ref 37.5–51.0)
Hemoglobin: 14.9 g/dL (ref 13.0–17.7)

## 2019-07-14 LAB — TESTOSTERONE: Testosterone: 317 ng/dL (ref 264–916)

## 2019-07-14 NOTE — Telephone Encounter (Signed)
Pt states he is looking for a doctor that can inject plasma and fat into the bottom of the ball of his foot because he only has skin and bone.

## 2019-07-19 ENCOUNTER — Ambulatory Visit (INDEPENDENT_AMBULATORY_CARE_PROVIDER_SITE_OTHER): Payer: Medicare Other | Admitting: Urology

## 2019-07-19 ENCOUNTER — Ambulatory Visit: Payer: Self-pay | Admitting: Urology

## 2019-07-19 ENCOUNTER — Other Ambulatory Visit: Payer: Self-pay

## 2019-07-19 ENCOUNTER — Encounter: Payer: Self-pay | Admitting: Urology

## 2019-07-19 VITALS — BP 122/76 | HR 76 | Ht 72.0 in | Wt 167.0 lb

## 2019-07-19 DIAGNOSIS — N4231 Prostatic intraepithelial neoplasia: Secondary | ICD-10-CM | POA: Diagnosis not present

## 2019-07-19 DIAGNOSIS — N4232 Atypical small acinar proliferation of prostate: Secondary | ICD-10-CM

## 2019-07-19 DIAGNOSIS — N400 Enlarged prostate without lower urinary tract symptoms: Secondary | ICD-10-CM

## 2019-07-19 NOTE — Progress Notes (Signed)
07/19/2019 8:57 AM   Timothy Turner June 15, 1941 [id number]  Referring provider: Leone Haven, MD [address] [place],  [place] [address]  Chief Complaint  Patient presents with  . Follow-up    Urologic history: 1.Elevated PSA  -prostate biopsy Dr. 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  Started on finasteride, PSA has remained stable.  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  He sustained a rib fractures in June 2020 and was having increased nocturia at 6-8 times per night.  He subsequently required open repair of these fractures and once recovered states his nocturia resolved.  He presently has no bothersome lower urinary tract symptoms.  He remains on finasteride.  He states his topical testosterone was discontinued due to an increase blood count.  Lab performed last week remarkable for PSA 0.9 (uncorrected), testosterone 317 and hematocrit 45.3   PMH: Past Medical History:  Diagnosis Date  . Aspiration precautions    uses thicken in liquids.  Eats pureed food.  . Barrett esophagus   . Depression   . Gallstones   . GERD (gastroesophageal reflux disease)   . Heart murmur    History of  . Hemorrhoids   . Hypertension   . Hypothyroidism   . Iron deficiency anemia 02/10/2012  . Mitral valve disease   . Myalgia    Multiple  . Neuromuscular disorder ([place])   . PAD (peripheral artery disease) (H[facility])    lower legs  . Pain syndrome, chronic    Severe  . Pneumonia    three times, last time >10 years ago  . Psoriasis   . Pulmonary embolism ([place]) 2019   left lung  . Rheumatic heart valve incompetence   . Shortness of breath dyspnea   . Thyroiditis     Surgical History: Past Surgical History:  Procedure Laterality Date  . BACK SURGERY    . CARDIAC CATHETERIZATION N/A 02/22/2015     Procedure: Left Heart Cath and Coronary Angiography;  Surgeon: Minna Merritts, MD;  Location: [facility];  Service: Cardiovascular;  Laterality: N/A;  . CATARACT EXTRACTION W/PHACO Left 07/29/2016   Procedure: CATARACT EXTRACTION PHACO AND INTRAOCULAR LENS PLACEMENT (IOC)  Left eye IVA Topical;  Surgeon: Leandrew Koyanagi, MD;  Location: [facility];  Service: Ophthalmology;  Laterality: Left;  . CATARACT EXTRACTION W/PHACO Right 08/19/2016   Procedure: CATARACT EXTRACTION PHACO AND INTRAOCULAR LENS PLACEMENT ([place]) right  TORIC;  Surgeon: Leandrew Koyanagi, MD;  Location: [facility];  Service: Ophthalmology;  Laterality: Right;  prefers early morning  . CHOLECYSTECTOMY  08/03/13  . COLONOSCOPY  03-08-12   Dr Donnella Sham  . COLONOSCOPY WITH PROPOFOL N/A 06/17/2017   Procedure: COLONOSCOPY WITH PROPOFOL;  Surgeon: [name], MD;  Location: [facility] END[facility]COPY;  Service: Gastroenterology;  Laterality: N/A;  . DIRECT LARYNGOSCOPY N/A 02/17/2016   Procedure: DIRECT LARYNGOSCOPY;  Surgeon: Jodi Marble, MD;  Location: [facility];  Service: ENT;  Laterality: N/A;  . ESOPHAGEAL MANOMETRY N/A 02/27/2015   Procedure: ESOPHAGEAL MANOMETRY (EM);  Surgeon: Lollie Sails, MD;  Location: [facility] ENDOSCOPY;  Service: Endoscopy;  Laterality: N/A;  . ESOPHAGOGASTRODUODENOSCOPY (EGD) WITH PROPOFOL N/A 02/18/2015   Procedure: ESOPHAGOGASTRODUODENOSCOPY (EGD) WITH PROPOFOL;  Surgeon: Lollie Sails, MD;  Location: [facility] ENDOSCOPY;  Service: Endoscopy;  Laterality: N/A;  . ESOPHAGOGASTRODUODENOSCOPY (EGD) WITH PROPOFOL N/A 06/17/2017  Procedure: ESOPHAGOGASTRODUODENOSCOPY (EGD) WITH PROPOFOL;  Surgeon: [name], MD;  Location: [facility] ENDOSCOPY;  Service: Gastroenterology;  Laterality: N/A;  . ESOPHAGOSCOPY W/ BOTOX INJECTION N/A 02/17/2016   Procedure: ESOPHAGOSCOPY WITH BOTOX INJECTION  ;  Surgeon: Jodi Marble, MD;  Location: [facility];  Service: ENT;   Laterality: N/A;  . HEMORROIDECTOMY    . UPPER GI ENDOSCOPY  03-06-2013   Dr Donnella Sham    Home Medications:  Allergies as of 07/19/2019      Reactions   Duloxetine    Unable to urinate   Nsaids Other (See Comments)   CANNOT TAKE NSAIDS BECAUSE OF ESOPHAGUS ISSUES CANNOT TAKE NSAIDS BECAUSE OF ESOPHAGUS ISSUES   Rosuvastatin Other (See Comments)   SEVERE MUSCLE ACHES SEVERE MUSCLE ACHES   Statins Other (See Comments)   SEVERE MUSCLE ACHES SEVERE MUSCLE ACHES Other reaction(s): Other (See Comments) SEVERE MUSCLE ACHES   Tolmetin Other (See Comments)   CANNOT TAKE NSAIDS BECAUSE OF ESOPHAGUS ISSUES CANNOT TAKE NSAIDS BECAUSE OF ESOPHAGUS ISSUES CANNOT TAKE NSAIDS BECAUSE OF ESOPHAGUS ISSUES CANNOT TAKE NSAIDS BECAUSE OF ESOPHAGUS ISSUES      Medication List       Accurate as of July 19, 2019  8:57 AM. If you have any questions, ask your nurse or doctor.        azelastine 0.1 % nasal spray Commonly known as: ASTELIN Place 2 sprays into both nostrils 2 (two) times daily. Use in each nostril as directed   calcipotriene 0.005 % cream Commonly known as: DOVONOX Apply topically 2 (two) times daily.   CENTRUM SILVER PO Take 1 tablet by mouth every evening.   cetirizine 10 MG tablet Commonly known as: ZYRTEC Take 10 mg by mouth daily.   clobetasol cream 0.05 % Commonly known as: TEMOVATE Apply topically as needed. Reported on 12/03/2015   clonazePAM 2 MG tablet Commonly known as: KLONOPIN TAKE 1 TABLET THREE TIMES A DAY AS NEEDED FOR SEVERE MUSCLE SPASM   colestipol 1 g tablet Commonly known as: COLESTID Take 3 tablets (3 g total) by mouth 2 (two) times daily.   escitalopram 10 MG tablet Commonly known as: LEXAPRO Take 10 mg by mouth at bedtime.   esomeprazole 40 MG capsule Commonly known as: NEXIUM TAKE 1 CAPSULE DAILY   ezetimibe 10 MG tablet Commonly known as: ZETIA Take 1 tablet (10 mg total) by mouth daily.   finasteride 5 MG tablet Commonly known  as: PROSCAR Take 1 tablet (5 mg total) by mouth daily.   Glucosamine-Chondroitin 250-200 MG Tabs Take by mouth daily.   levothyroxine 75 MCG tablet Commonly known as: SYNTHROID TAKE 1 TABLET DAILY   metoprolol tartrate 25 MG tablet Commonly known as: LOPRESSOR Take 25 mg by mouth 2 (two) times daily as needed.   Nexletol 180 MG Tabs Generic drug: Bempedoic Acid Take 180 mg by mouth daily.   Plant Sterols and Stanols 450 MG Caps Take by mouth 2 (two) times daily.   RESVERATROL PO Take 1,000 mg by mouth 2 (two) times daily.   Vitamin D3 25 MCG (1000 UT) Caps Take by mouth daily.       Allergies:  Allergies  Allergen Reactions  . Duloxetine     Unable to urinate  . Nsaids Other (See Comments)    CANNOT TAKE NSAIDS BECAUSE OF ESOPHAGUS ISSUES CANNOT TAKE NSAIDS BECAUSE OF ESOPHAGUS ISSUES  . Rosuvastatin Other (See Comments)    SEVERE MUSCLE ACHES SEVERE MUSCLE ACHES  . Statins Other (See Comments)  SEVERE MUSCLE ACHES SEVERE MUSCLE ACHES Other reaction(s): Other (See Comments) SEVERE MUSCLE ACHES  . Tolmetin Other (See Comments)    CANNOT TAKE NSAIDS BECAUSE OF ESOPHAGUS ISSUES CANNOT TAKE NSAIDS BECAUSE OF ESOPHAGUS ISSUES CANNOT TAKE NSAIDS BECAUSE OF ESOPHAGUS ISSUES CANNOT TAKE NSAIDS BECAUSE OF ESOPHAGUS ISSUES    Family History: Family History  Problem Relation Age of Onset  . Heart failure Mother   . Heart failure Father   . Emphysema Father        smoked  . Cancer Sister   . Aneurysm Sister        Brain    Social History:  reports that he quit smoking about 15 years ago. His smoking use included cigarettes. He has a 30.00 pack-year smoking history. He has never used smokeless tobacco. He reports that he does not drink alcohol or use drugs.  ROS: UROLOGY Frequent Urination?: No Hard to postpone urination?: No Burning/pain with urination?: No Get up at night to urinate?: No Leakage of urine?: No Urine stream starts and stops?: No Trouble  starting stream?: No Do you have to strain to urinate?: No Blood in urine?: No Urinary tract infection?: No Sexually transmitted disease?: No Injury to kidneys or bladder?: No Painful intercourse?: No Weak stream?: No Erection problems?: No Penile pain?: No  Gastrointestinal Nausea?: No Vomiting?: No Indigestion/heartburn?: No Diarrhea?: No Constipation?: No  Constitutional Fever: No Night sweats?: No Weight loss?: No Fatigue?: No  Skin Skin rash/lesions?: No Itching?: No  Eyes Blurred vision?: No Double vision?: No  Ears/Nose/Throat Sore throat?: No Sinus problems?: No  Hematologic/Lymphatic Swollen glands?: No Easy bruising?: No  Cardiovascular Leg swelling?: No Chest pain?: No  Respiratory Cough?: No Shortness of breath?: No  Endocrine Excessive thirst?: No  Musculoskeletal Back pain?: No Joint pain?: No  Neurological Headaches?: No Dizziness?: No  Psychologic Depression?: No Anxiety?: No  Physical Exam: BP 122/76   Pulse 76   Ht 6' (1.829 m)   Wt 167 lb (75.8 kg)   BMI 22.65 kg/m   Constitutional:  Alert and oriented, No acute distress. HEENT: [place] AT, moist mucus membranes.  Trachea midline, no masses. Cardiovascular: No clubbing, cyanosis, or edema. Respiratory: Normal respiratory effort, no increased work of breathing. GI: Abdomen is soft, nontender, nondistended, no abdominal masses GU: Prostate 35 g, smooth without nodules Lymph: No cervical or inguinal lymphadenopathy. Skin: No rashes, bruises or suspicious lesions. Neurologic: Grossly intact, no focal deficits, moving all 4 extremities. Psychiatric: Normal mood and affect.   Assessment & Plan:    - High-grade PIN with focus ASAP Corrected PSA level normal and benign DRE.  Follow-up 1 year for PSA/DRE.  He did not need a refill of finasteride at this time.  - Hypogonadism TRT discontinued.  Recent testosterone was low normal   Abbie Sons, MD  [facility] [address], [place], [place] [address] [phone]

## 2019-09-01 ENCOUNTER — Ambulatory Visit: Payer: Medicare Other | Admitting: Family Medicine

## 2019-09-01 ENCOUNTER — Ambulatory Visit: Payer: Medicare Other

## 2019-10-12 ENCOUNTER — Other Ambulatory Visit: Payer: Self-pay | Admitting: Family Medicine

## 2019-10-12 MED ORDER — ESOMEPRAZOLE MAGNESIUM 40 MG PO CPDR: 40.0000 mg | DELAYED_RELEASE_CAPSULE | Freq: Every day | ORAL | 3 refills | Status: DC

## 2019-10-16 ENCOUNTER — Other Ambulatory Visit: Payer: Self-pay | Admitting: Family Medicine

## 2019-10-16 ENCOUNTER — Encounter: Payer: Self-pay | Admitting: Family Medicine

## 2019-10-16 ENCOUNTER — Other Ambulatory Visit: Payer: Self-pay

## 2019-10-16 ENCOUNTER — Ambulatory Visit (INDEPENDENT_AMBULATORY_CARE_PROVIDER_SITE_OTHER): Payer: Medicare Other

## 2019-10-16 ENCOUNTER — Ambulatory Visit (INDEPENDENT_AMBULATORY_CARE_PROVIDER_SITE_OTHER): Payer: Medicare Other | Admitting: Family Medicine

## 2019-10-16 VITALS — BP 130/70 | HR 75 | Temp 97.9°F | Ht 72.0 in | Wt 174.8 lb

## 2019-10-16 DIAGNOSIS — Z0001 Encounter for general adult medical examination with abnormal findings: Secondary | ICD-10-CM

## 2019-10-16 DIAGNOSIS — E7849 Other hyperlipidemia: Secondary | ICD-10-CM

## 2019-10-16 DIAGNOSIS — E039 Hypothyroidism, unspecified: Secondary | ICD-10-CM

## 2019-10-16 DIAGNOSIS — R21 Rash and other nonspecific skin eruption: Secondary | ICD-10-CM | POA: Diagnosis not present

## 2019-10-16 DIAGNOSIS — R0781 Pleurodynia: Secondary | ICD-10-CM

## 2019-10-16 LAB — COMPREHENSIVE METABOLIC PANEL
ALT: 12 U/L (ref 0–53)
AST: 22 U/L (ref 0–37)
Albumin: 4.4 g/dL (ref 3.5–5.2)
Alkaline Phosphatase: 68 U/L (ref 39–117)
BUN: 14 mg/dL (ref 6–23)
CO2: 29 mEq/L (ref 19–32)
Calcium: 9.8 mg/dL (ref 8.4–10.5)
Chloride: 102 mEq/L (ref 96–112)
Creatinine, Ser: 1.48 mg/dL (ref 0.40–1.50)
GFR: 46.16 mL/min — ABNORMAL LOW (ref >60.00)
Glucose, Bld: 103 mg/dL — ABNORMAL HIGH (ref 70–99)
Potassium: 4.9 mEq/L (ref 3.5–5.1)
Sodium: 140 mEq/L (ref 135–145)
Total Bilirubin: 0.8 mg/dL (ref 0.2–1.2)
Total Protein: 6.6 g/dL (ref 6.0–8.3)

## 2019-10-16 LAB — LDL CHOLESTEROL, DIRECT: Direct LDL: 47 mg/dL

## 2019-10-16 LAB — TSH: TSH: 5.01 u[IU]/mL — ABNORMAL HIGH (ref 0.35–4.50)

## 2019-10-16 MED ORDER — KETOCONAZOLE 2 % EX SHAM: 1.0000 "application " | MEDICATED_SHAMPOO | CUTANEOUS | 0 refills | Status: DC

## 2019-10-16 NOTE — Addendum Note (Signed)
Addended by: Leone Haven on: 10/16/2019 09:52 AM   Modules accepted: Orders

## 2019-10-16 NOTE — Progress Notes (Addendum)
[name], MD Phone: [phone]  Timothy Turner is a 79 y.o. male who presents today for CPE.  Diet: Essentially vegan with lots of vegetables and fruits.  He avoids fats.  He does not eat any meat.  He does take in some whey protein.  He is trying to gain some weight as he had lost a significant amount previously.  His weight recently has been increasing. Exercise: No exercise given foot issues. Colonoscopy: Up-to-date 06/17/2017.  Potential for another colonoscopy depending on his health at age 88. Prostate cancer screening: Patient has aged out. Vaccines-   Flu: Out of season.  Tetanus: Up-to-date  Shingles: Up-to-date  COVID19: Up-to-date  Pneumonia: Up-to-date Tobacco use: No Alcohol use: No Illicit Drug use: No Dentist: Has implants Ophthalmology: yes  Right rib pain: Patient notes he fell out of a chair about 6 weeks ago.  He did not initially have pain though over the last couple of days he has had some discomfort after he had a pop when he bent over.  He has had broken ribs before and does have a plate in his right ribs.  Itchy scalp: Patient notes this has been going on 3 to 4 months.  He has had some bumps on his forehead and now has them on his scalp.  They are itchy.  He tried topical steroid cream that did not help with the rash though did help with the itching.  He does have a history of psoriasis on his feet.   Active Ambulatory Problems    Diagnosis Date Noted  . MURMUR 07/24/2009  . PVD (peripheral vascular disease) ([place]) 01/27/2011  . Myalgia 03/12/2011  . Hypothyroidism 03/12/2011  . Chronic pain syndrome 08/12/2011  . 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 Smoking history 02/01/2012  . 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 Mitral valve regurgitation 09/14/2013  . Muscle cramps 12/19/2013  . Renal insufficiency 01/22/2014  . Mitral valve prolapse 09/12/2014  . Adjustment disorder with depressed mood 11/12/2014  .  Dysphagia 11/12/2014  . COPD (chronic obstructive pulmonary disease) ([place]) 02/06/2015  . Angina pectoris ([place]) 02/22/2015  . Dyspnea on exertion 02/24/2017  . Solitary pulmonary nodule on lung CT 02/24/2017  . Cheilitis 06/02/2017  . Aspiration into airway 01/14/2016  . 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 Elevated prostate specific antigen (PSA) 03/01/2012  . Incomplete emptying of bladder 07/31/2015  . BPH (benign prostatic hyperplasia) 03/01/2012  . 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 Urinary hesitancy 03/01/2012  . Gastroesophageal reflux disease 02/11/2016  . Rhinitis 08/26/2017  . Left knee pain 10/29/2017  . 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 Headache 02/23/2018  . Chronic kidney disease (CKD), stage III (moderate) 02/23/2018  . Upper airway cough syndrome 03/04/2018  . De Quervain's tenosynovitis 06/25/2018  . Intermittent claudication ([place]) 06/25/2018  . Thyroid nodule 07/21/2018  . Bilateral hand pain 09/05/2018  . 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 Psoriasis 09/06/2018  . Multiple closed fractures of ribs of right side 01/11/2019  . Weight loss 01/11/2019  . Foot pain, bilateral 01/11/2019  . Atypical small acinar proliferation of prostate 07/19/2019  . Encounter for general adult medical examination with abnormal findings 10/16/2019  . Rib pain on right side 10/16/2019  . Rash 10/16/2019   Resolved Ambulatory Problems    Diagnosis Date Noted  . Acute thyroiditis 07/24/2009  . HYPERTENSION, UNSPECIFIED 07/24/2009  . Medicare annual wellness visit, subsequent 02/10/2012  . Iron  deficiency anemia 02/10/2012  . Prostate cancer ([place]) 03/14/2012  . Chest pain 07/04/2013  . Abdominal pain, epigastric 07/04/2013  . Gall stones 07/27/2013  . Open wound of knee, leg (except thigh), and ankle, complicated 83/25/4982  . Abdominal pain 10/09/2013  . Muscle pain 12/17/2014  .  Dysphagia, oropharyngeal phase 01/22/2015  . Periumbilical abdominal pain 05/28/2015  . Clay-colored stools 05/13/2017  . Cervical dysphagia 10/07/2015  . Esophageal dysphagia 01/10/2015  . Pulmonary embolism ([place]) 11/11/2017  . Pneumonia 11/18/2017  . URI (upper respiratory infection) 09/05/2018   Past Medical History:  Diagnosis Date  . Aspiration precautions   . Depression   . Gallstones   . GERD (gastroesophageal reflux disease)   . Heart murmur   . Hemorrhoids   . Hypertension   . Mitral valve disease   . Neuromuscular disorder ([place])   . PAD (peripheral artery disease) ([place])   . Pain syndrome, chronic   . Rheumatic heart valve incompetence   . Shortness of breath dyspnea   . Thyroiditis     Family History  Problem Relation Age of Onset  . Heart failure Mother   . Heart failure Father   . Emphysema Father        smoked  . Cancer Sister   . Aneurysm Sister        Brain    Social History   Socioeconomic History  . Marital status: Married    Spouse name: Not on file  . Number of children: 2  . Years of education: Not on file  . Highest education level: Not on file  Occupational History  . 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 Occupation: Disabled  Tobacco Use  . Smoking status: Former Smoker    Packs/day: 1.00    Years: 30.00    Pack years: 30.00    Types: Cigarettes    Quit date: 06/22/2004    Years since quitting: 15.3  . Smokeless tobacco: Never Used  Substance and Sexual Activity  . Alcohol use: No    Alcohol/week: 0.0 standard drinks  . Drug use: No  . Sexual activity: Yes  Other Topics Concern  . Not on file  Social History Narrative   Drinks 4-5 cups of coffee a day and 3-4 cans of soda a day.   Social Determinants of Health   Financial Resource Strain:   . Difficulty of Paying Living Expenses:   Food Insecurity:   . Worried About Charity fundraiser in the Last Year:   . Arboriculturist in the Last Year:     Transportation Needs:   . Film/video editor (Medical):   [name] Lack of Transportation (Non-Medical):   Physical Activity:   . Days of Exercise per Week:   . Minutes of Exercise per Session:   Stress:   . Feeling of Stress :   Social Connections:   . Frequency of Communication with Friends and Family:   . Frequency of Social Gatherings with Friends and Family:   . Attends Religious Services:   . Active Member of Clubs or Organizations:   . Attends Archivist Meetings:   [name] Marital Status:   Intimate Partner Violence:   . Fear of Current or Ex-Partner:   . Emotionally Abused:   [name] Physically Abused:   . Sexually Abused:     ROS  General:  Negative for nexplained weight loss, fever Skin: Negative for new or changing mole, sore that won't  heal HEENT: Negative for trouble hearing, trouble seeing, ringing in ears, mouth sores, hoarseness, change in voice, dysphagia. CV:  Negative for chest pain, dyspnea, edema, palpitations Resp: Negative for cough, dyspnea, hemoptysis GI: Negative for nausea, vomiting, diarrhea, constipation, abdominal pain, melena, hematochezia. GU: Negative for dysuria, incontinence, urinary hesitance, hematuria, vaginal or penile discharge, polyuria, sexual difficulty, lumps in testicle or breasts MSK: Negative for muscle cramps or aches, joint pain or swelling Neuro: Negative for headaches, weakness, numbness, dizziness, passing out/fainting Psych: Negative for depression, anxiety, memory problems  Objective  Physical Exam Vitals:   10/16/19 0904  BP: 130/70  Pulse: 75  Temp: 97.9 F (36.6 C)  SpO2: 98%    BP Readings from Last 3 Encounters:  10/16/19 130/70  07/19/19 122/76  05/01/19 122/80   Wt Readings from Last 3 Encounters:  10/16/19 174 lb 12.8 oz (79.3 kg)  07/19/19 167 lb (75.8 kg)  05/01/19 160 lb 8 oz (72.8 kg)    Physical Exam Constitutional:      General: He is not in acute distress.    Appearance: He is not  diaphoretic.  HENT:     Head: Normocephalic and atraumatic.     Comments: Patient does have some flaky irritated areas on his scalp Eyes:     Conjunctiva/sclera: Conjunctivae normal.     Pupils: Pupils are equal, round, and reactive to light.  Cardiovascular:     Rate and Rhythm: Normal rate and regular rhythm.     Heart sounds: Normal heart sounds.  Pulmonary:     Effort: Pulmonary effort is normal.     Breath sounds: Normal breath sounds.  Abdominal:     General: Bowel sounds are normal. There is no distension.     Palpations: Abdomen is soft.     Tenderness: There is no abdominal tenderness. There is no guarding or rebound.  Musculoskeletal:     Right lower leg: No edema.     Left lower leg: No edema.     Comments: Slight tenderness right anterior mid ribs, there is a well-healed scar in the mid axillary area in his lower ribs with some palpable bony defects in that area  Lymphadenopathy:     Cervical: No cervical adenopathy.  Skin:    General: Skin is warm and dry.  Neurological:     Mental Status: He is alert.  Psychiatric:        Mood and Affect: Mood normal.      Assessment/Plan:   Encounter for general adult medical examination with abnormal findings Physical exam completed.  Encouraged continued healthy diet.  He will get back to exercising once his feet are evaluated by the specialist at [facility].  Health maintenance is up-to-date.  We will get lab work as outlined below.  Rib pain on right side Status post fall 6 weeks ago though pain did not start until several days ago.  Will obtain an x-ray.  Rash Noted in the scalp.  Could be seborrheic dermatitis though could also be psoriasis.  Will trial ketoconazole.  If that is not helpful we can trial a steroid following and refer to dermatology.   Orders Placed This Encounter  Procedures  . DG Ribs Unilateral Right    Standing Status:   Future    Standing Expiration Date:   12/15/2020    Order Specific Question:    Reason for Exam (SYMPTOM  OR DIAGNOSIS REQUIRED)    Answer:   right rib pain, injury 6 weeks ago, prior history rib  fracture.    Order Specific Question:   Preferred imaging location?    Answer:   [facility] Specific Question:   Radiology Contrast Protocol - do NOT remove file path    Answer:   _0 [email or web address]  . Comp Met (CMET)  . Direct LDL  . TSH    Meds ordered this encounter  Medications  . ketoconazole (NIZORAL) 2 % shampoo    Sig: Apply 1 application topically 2 (two) times a week.    Dispense:  120 mL    Refill:  0    This visit occurred during the SARS-CoV-2 public health emergency.  Safety protocols were in place, including screening questions prior to the visit, additional usage of staff PPE, and extensive cleaning of exam room while observing appropriate contact time as indicated for disinfecting solutions.    [name], MD [place]

## 2019-10-16 NOTE — Assessment & Plan Note (Signed)
Physical exam completed.  Encouraged continued healthy diet.  He will get back to exercising once his feet are evaluated by the specialist at [facility]  Health maintenance is up-to-date.  We will get lab work as outlined below.

## 2019-10-16 NOTE — Assessment & Plan Note (Signed)
Noted in the scalp.  Could be seborrheic dermatitis though could also be psoriasis.  Will trial ketoconazole.  If that is not helpful we can trial a steroid following and refer to dermatology.

## 2019-10-16 NOTE — Assessment & Plan Note (Signed)
Status post fall 6 weeks ago though pain did not start until several days ago.  Will obtain an x-ray.

## 2019-10-16 NOTE — Patient Instructions (Signed)
Nice to see you. Please try to count her calories.  A good calorie amount will be 2200 calories a day and you may be able to put some weight on with that. We will get some labs and x-rays today. Try the ketoconazole shampoo.  If is not beneficial please let us know.

## 2019-10-24 ENCOUNTER — Telehealth: Payer: Self-pay

## 2019-10-24 DIAGNOSIS — E039 Hypothyroidism, unspecified: Secondary | ICD-10-CM

## 2019-10-24 DIAGNOSIS — N289 Disorder of kidney and ureter, unspecified: Secondary | ICD-10-CM

## 2019-10-24 NOTE — Telephone Encounter (Signed)
-----   Message from Leone Haven, MD sent at 10/23/2019 12:15 PM EDT ----- Please follow-up with the patient.  He needs to discontinue the biotin.  We should recheck his TSH in 2 to 3 weeks.  Please find out if he is taking any Aleve or ibuprofen.  Please see if he has been drinking plenty of fluids.  His kidney function was mildly decreased though this does not appear that this was addressed when Fransisco Beau spoke with him.  He needs to have that rechecked sometime this week.  Thanks.

## 2019-10-31 ENCOUNTER — Telehealth: Payer: Self-pay | Admitting: Family Medicine

## 2019-10-31 DIAGNOSIS — R21 Rash and other nonspecific skin eruption: Secondary | ICD-10-CM

## 2019-10-31 NOTE — Telephone Encounter (Signed)
Pt wanted to let Dr. Caryl Bis know that the ketoconazole (NIZORAL) 2 % shampoo is not working and wanted to try the other one that they talked about at his last appt

## 2019-11-01 ENCOUNTER — Other Ambulatory Visit: Payer: Self-pay

## 2019-11-01 DIAGNOSIS — M791 Myalgia, unspecified site: Secondary | ICD-10-CM

## 2019-11-01 MED ORDER — CLONAZEPAM 2 MG PO TABS: ORAL_TABLET | ORAL | 1 refills | Status: DC

## 2019-11-02 MED ORDER — CLOBETASOL PROPIONATE 0.05 % EX SHAM: MEDICATED_SHAMPOO | CUTANEOUS | 0 refills | Status: DC

## 2019-11-02 NOTE — Addendum Note (Signed)
Addended byCaryl Bis, Mayvis Agudelo G on: 11/02/2019 01:00 PM   Modules accepted: Orders

## 2019-11-02 NOTE — Telephone Encounter (Signed)
Referral placed.

## 2019-11-02 NOTE — Addendum Note (Signed)
Addended by: Leone Haven on: 11/02/2019 08:28 AM   Modules accepted: Orders

## 2019-11-02 NOTE — Telephone Encounter (Signed)
Have sent in clobetasol shampoo for him to try. He should not use this for longer than 2 weeks. He should not apply this to his face or genitals. I would also like to refer him to dermatology for evaluation. I can place this referral after you speak with him.

## 2019-11-02 NOTE — Telephone Encounter (Signed)
Patient called & advised om how to use clobetasol shampoo. He would like to see [facility] Dr. Kellie Moor.

## 2019-11-08 ENCOUNTER — Other Ambulatory Visit: Payer: Self-pay

## 2019-11-08 ENCOUNTER — Ambulatory Visit (INDEPENDENT_AMBULATORY_CARE_PROVIDER_SITE_OTHER): Payer: Medicare Other | Admitting: Dermatology

## 2019-11-08 DIAGNOSIS — L409 Psoriasis, unspecified: Secondary | ICD-10-CM | POA: Diagnosis not present

## 2019-11-08 DIAGNOSIS — L299 Pruritus, unspecified: Secondary | ICD-10-CM | POA: Diagnosis not present

## 2019-11-08 MED ORDER — OTEZLA 30 MG PO TABS: 30.0000 mg | ORAL_TABLET | Freq: Two times a day (BID) | ORAL | 12 refills | Status: DC

## 2019-11-08 MED ORDER — CLOBETASOL PROPIONATE 0.05 % EX SOLN: CUTANEOUS | 2 refills | Status: DC

## 2019-11-08 MED ORDER — OTEZLA 30 MG PO TABS: 30.0000 mg | ORAL_TABLET | Freq: Two times a day (BID) | ORAL | 2 refills | Status: DC

## 2019-11-08 MED ORDER — CICLOPIROX 1 % EX SHAM: MEDICATED_SHAMPOO | CUTANEOUS | 2 refills | Status: DC

## 2019-11-08 NOTE — Patient Instructions (Signed)
Side effects of Otezla (apremilast) include diarrhea, nausea, headache, upper respiratory infection, depression, and weight decrease (5-10%). It should only be taken by pregnant women after a discussion regarding risks and benefits with their doctor.  Recommend daily broad spectrum sunscreen SPF 30+ to sun-exposed areas, reapply every 2 hours as needed. Call for new or changing lesions.

## 2019-11-08 NOTE — Progress Notes (Signed)
   Follow-Up Visit   Subjective  Timothy Turner is a 79 y.o. male who presents for the following: Rash.  Patient has a rash on scalp that has been present for 6-8 months. Started with small bumps around hairline and behind ears, is crusty and itches. Itches worse at night. Clobetasol shampoo was very expensive and ketoconazole 2% shampoo has not helped. Patient also has psoriasis and is usually followed by Dr. Nehemiah Massed. He is not using anything to treat it right now. Present for years at fingernails. He states he has osteoarthritis in his hands.  The following portions of the chart were reviewed this encounter and updated as appropriate:     Review of Systems:  No other skin or systemic complaints except as noted in HPI or Assessment and Plan.  Objective  Well appearing patient in no apparent distress; mood and affect are within normal limits.  A focused examination was performed including scalp, face. Relevant physical exam findings are noted in the Assessment and Plan.  Objective  Finger Nails, scalp: Distal onycholysis with subungual debris all fingernails, thumbnails Erythema and scale of scalp, increased at vertex, pt states very itchy all over   Assessment & Plan  Psoriasis Finger Nails, scalp  With scalp, nail involvement and significant Pruritus.  BSA ~6% Patient also has arthritis   Start ciclopirox shampoo or an over the counter tar medicated shampoo if not covered. Massage into scalp and leave it sit for several minutes before before rinsing out. Use up to daily.   Start clobetasol solution to AA's to spot treat itchy areas scalp BID.  Will send in [place] for 30 mg PO bid  for approval. Samples given to patient.   Side effects of Otezla (apremilast) include diarrhea, nausea, headache, upper respiratory infection, depression, and weight decrease (5-10%). It should only be taken by pregnant women after a discussion regarding risks and benefits with their doctor.    Apremilast (OTEZLA) 30 MG TABS - Finger Nails, scalp  Apremilast (OTEZLA) 30 MG TABS - Finger Nails, scalp  clobetasol (TEMOVATE) 0.05 % external solution - Finger Nails, scalp  Ciclopirox 1 % shampoo - Finger Nails, scalp  Return in about 4 weeks (around 12/06/2019) for psoriasis.   Graciella Belton, RMA, am acting as scribe for Brendolyn Patty, MD .  Documentation: I have reviewed the above documentation for accuracy and completeness, and I agree with the above.  Brendolyn Patty MD

## 2019-11-09 ENCOUNTER — Other Ambulatory Visit: Payer: Self-pay | Admitting: Family Medicine

## 2019-11-09 ENCOUNTER — Other Ambulatory Visit: Payer: Self-pay

## 2019-11-09 DIAGNOSIS — L219 Seborrheic dermatitis, unspecified: Secondary | ICD-10-CM

## 2019-11-09 DIAGNOSIS — L409 Psoriasis, unspecified: Secondary | ICD-10-CM

## 2019-11-09 MED ORDER — CLOBETASOL PROPIONATE 0.05 % EX SOLN: CUTANEOUS | 2 refills | Status: DC

## 2019-11-09 MED ORDER — CICLOPIROX 1 % EX SHAM: MEDICATED_SHAMPOO | CUTANEOUS | 2 refills | Status: DC

## 2019-11-09 NOTE — Progress Notes (Signed)
Patient called and asked for prescriptions from yesterdays office visit to be switch to [facility].

## 2019-11-14 ENCOUNTER — Other Ambulatory Visit (INDEPENDENT_AMBULATORY_CARE_PROVIDER_SITE_OTHER): Payer: Medicare Other

## 2019-11-14 ENCOUNTER — Other Ambulatory Visit: Payer: Self-pay

## 2019-11-14 DIAGNOSIS — E039 Hypothyroidism, unspecified: Secondary | ICD-10-CM | POA: Diagnosis not present

## 2019-11-14 DIAGNOSIS — N289 Disorder of kidney and ureter, unspecified: Secondary | ICD-10-CM | POA: Diagnosis not present

## 2019-11-14 LAB — COMPREHENSIVE METABOLIC PANEL
ALT: 11 U/L (ref 0–53)
AST: 19 U/L (ref 0–37)
Albumin: 4.4 g/dL (ref 3.5–5.2)
Alkaline Phosphatase: 64 U/L (ref 39–117)
BUN: 17 mg/dL (ref 6–23)
CO2: 27 mEq/L (ref 19–32)
Calcium: 9.9 mg/dL (ref 8.4–10.5)
Chloride: 105 mEq/L (ref 96–112)
Creatinine, Ser: 1.44 mg/dL (ref 0.40–1.50)
GFR: 47.63 mL/min — ABNORMAL LOW (ref >60.00)
Glucose, Bld: 110 mg/dL — ABNORMAL HIGH (ref 70–99)
Potassium: 4.4 mEq/L (ref 3.5–5.1)
Sodium: 141 mEq/L (ref 135–145)
Total Bilirubin: 0.6 mg/dL (ref 0.2–1.2)
Total Protein: 6.5 g/dL (ref 6.0–8.3)

## 2019-11-14 LAB — TSH: TSH: 4.18 u[IU]/mL (ref 0.35–4.50)

## 2019-11-17 ENCOUNTER — Other Ambulatory Visit: Payer: Self-pay | Admitting: Family Medicine

## 2019-11-17 DIAGNOSIS — R944 Abnormal results of kidney function studies: Secondary | ICD-10-CM

## 2019-11-23 ENCOUNTER — Other Ambulatory Visit: Payer: Self-pay

## 2019-11-23 DIAGNOSIS — L400 Psoriasis vulgaris: Secondary | ICD-10-CM

## 2019-11-23 MED ORDER — OTEZLA 30 MG PO TABS: 30.0000 mg | ORAL_TABLET | Freq: Two times a day (BID) | ORAL | 5 refills | Status: DC

## 2019-11-23 NOTE — Telephone Encounter (Signed)
Fax received from [facility] that they received a prescription for patients "free" medication but needs a hard copy or escribe. Can not come from another pharmacy. RX has electronically sent in.

## 2019-11-24 ENCOUNTER — Other Ambulatory Visit: Payer: Self-pay | Admitting: Family Medicine

## 2019-11-28 ENCOUNTER — Ambulatory Visit: Payer: Medicare Other

## 2019-11-30 ENCOUNTER — Other Ambulatory Visit: Payer: Self-pay

## 2019-11-30 ENCOUNTER — Ambulatory Visit
Admission: RE | Admit: 2019-11-30 | Discharge: 2019-11-30 | Disposition: A | Discharge status: Home / Self Care | Payer: Medicare Other | Source: Ambulatory Visit | Attending: Family Medicine | Admitting: Family Medicine

## 2019-11-30 DIAGNOSIS — R944 Abnormal results of kidney function studies: Secondary | ICD-10-CM | POA: Diagnosis not present

## 2019-12-04 ENCOUNTER — Telehealth: Payer: Self-pay | Admitting: Family Medicine

## 2019-12-04 NOTE — Telephone Encounter (Signed)
Patient called and prefers a referral to [facility] by [employer].  Joriel Streety,cma

## 2019-12-04 NOTE — Telephone Encounter (Signed)
Patient called and prefers a referral to [facility] by [employer].

## 2019-12-05 ENCOUNTER — Other Ambulatory Visit: Payer: Self-pay | Admitting: Family Medicine

## 2019-12-05 DIAGNOSIS — N261 Atrophy of kidney (terminal): Secondary | ICD-10-CM

## 2019-12-05 DIAGNOSIS — R944 Abnormal results of kidney function studies: Secondary | ICD-10-CM

## 2019-12-05 NOTE — Telephone Encounter (Signed)
What is this referral for? We discussed a referral to nephrology. Is that what he means?

## 2019-12-05 NOTE — Telephone Encounter (Signed)
Yes he means nephrologist.  Lesia Hausen

## 2019-12-06 ENCOUNTER — Other Ambulatory Visit: Payer: Self-pay

## 2019-12-06 ENCOUNTER — Ambulatory Visit (INDEPENDENT_AMBULATORY_CARE_PROVIDER_SITE_OTHER): Payer: Medicare Other | Admitting: Dermatology

## 2019-12-06 ENCOUNTER — Telehealth: Payer: Self-pay | Admitting: Family Medicine

## 2019-12-06 DIAGNOSIS — L409 Psoriasis, unspecified: Secondary | ICD-10-CM

## 2019-12-06 DIAGNOSIS — L219 Seborrheic dermatitis, unspecified: Secondary | ICD-10-CM | POA: Diagnosis not present

## 2019-12-06 MED ORDER — CLOBETASOL PROPIONATE 0.05 % EX SOLN: CUTANEOUS | 2 refills | Status: DC

## 2019-12-06 MED ORDER — CLOBETASOL PROPIONATE 0.05 % EX SHAM: MEDICATED_SHAMPOO | CUTANEOUS | 2 refills | Status: DC

## 2019-12-06 NOTE — Progress Notes (Signed)
   Follow-Up Visit   Subjective  Timothy Turner is a 79 y.o. male who presents for the following: Follow-up.  Patient here today for 4 week follow up for psoriasis of the scalp. Patient is using ciclopirox shampoo daily and clobetasol solution twice a day. It seems to be improving but patient still has areas that itch. He is taking Otezla samples with no side effects and has enough for about 4-5 more days.  The following portions of the chart were reviewed this encounter and updated as appropriate:      Review of Systems:  No other skin or systemic complaints except as noted in HPI or Assessment and Plan.  Objective  Well appearing patient in no apparent distress; mood and affect are within normal limits.  A focused examination was performed including scalp. Relevant physical exam findings are noted in the Assessment and Plan.  Objective  Scalp: Mild erythema and scale occipital scalp- still gets itchy spots   Assessment & Plan  Psoriasis Scalp  Scalp- Some improvement Cont Otezla 30mg  1 PO BID. Sample Bridge pack given to patient.  Lot #[id number]  Exp: Feb 2023 Switch to clobetasol shampoo daily up to 4 weeks then as needed for itch, massaging into scalp and leave on for a few minutes before rinsing.  Cont clobetasol solution 1-2 times a day to affected areas as needed for itch.   Apremilast (OTEZLA) 30 MG TABS - Scalp  Ciclopirox 1 % shampoo - Scalp  clobetasol (TEMOVATE) 0.05 % external solution - Scalp  Clobetasol Propionate 0.05 % shampoo - Scalp  Seborrheic dermatitis  Reordered Medications clobetasol (TEMOVATE) 0.05 % external solution  Other Related Medications Ciclopirox 1 % shampoo  Return in about 2 months (around 02/05/2020) for psoriasis of the scalp.  Graciella Belton, RMA, am acting as scribe for Brendolyn Patty, MD .  Documentation: I have reviewed the above documentation for accuracy and completeness, and I agree with the above.  Brendolyn Patty  MD

## 2019-12-06 NOTE — Telephone Encounter (Signed)
Pt called wanted to know about his referral and wanted to request that he go to the one next to the cancer center and the [place] is [phone]

## 2019-12-06 NOTE — Telephone Encounter (Signed)
Please see the patients request for his nephrology referral. The referral has already been placed. Can you send it to [facility]?

## 2019-12-06 NOTE — Telephone Encounter (Signed)
Pt called wanted to know about his referral to nephrology  and wanted to request that he go to the one next to the cancer center in [place] and the phone is [phone]. He stated he does not want a NP he wants a MD.  Thanks Jairus Tonne,cma

## 2019-12-14 ENCOUNTER — Ambulatory Visit (INDEPENDENT_AMBULATORY_CARE_PROVIDER_SITE_OTHER): Payer: Medicare Other

## 2019-12-14 VITALS — Ht 72.0 in | Wt 174.0 lb

## 2019-12-14 DIAGNOSIS — Z0000 Encounter for general adult medical examination without abnormal findings: Secondary | ICD-10-CM

## 2019-12-14 NOTE — Patient Instructions (Addendum)
Timothy Turner , Thank you for taking time to come for your Medicare Wellness Visit. I appreciate your ongoing commitment to your health goals. Please review the following plan we discussed and let me know if I can assist you in the future.   These are the goals we discussed: Goals      Patient Stated   .  Maintain Healthy Lifestyle (pt-stated)       This is a list of the screening recommended for you and due dates:  Health Maintenance  Topic Date Due  . Flu Shot  01/21/2020  . Tetanus Vaccine  02/09/2022  . COVID-19 Vaccine  Completed  .  Hepatitis C: One time screening is recommended by Center for Disease Control  (CDC) for  adults born from [birth year] through 1963.   Completed  . Pneumonia vaccines  Completed    Immunizations Immunization History  Administered Date(s) Administered  . Influenza Split 03/12/2011, 03/14/2012  . Influenza, High Dose Seasonal PF 02/28/2015, 03/25/2016, 04/30/2017, 02/23/2018  . Influenza,inj,Quad PF,6+ Mos 03/31/2013, 04/24/2014  . PFIZER SARS-COV-2 Vaccination 06/28/2019, 07/19/2019  . Pneumococcal Conjugate-13 12/19/2013  . Pneumococcal Polysaccharide-23 08/12/2011  . Tdap 02/10/2012  . Zoster 02/17/2003  . Zoster Recombinat (Shingrix) 02/23/2018, 04/26/2018   Follow up 02/16/20  Advanced directives: End of life planning; Advance aging; Advanced directives discussed.  Copy of current HCPOA/Living Will requested.    Conditions/risks identified: none new  Follow up in one year for your annual wellness visit.   Preventive Care 55 Years and Older, Male Preventive care refers to lifestyle choices and visits with your health care provider that can promote health and wellness. What does preventive care include?  A yearly physical exam. This is also called an annual well check.  Dental exams once or twice a year.  Routine eye exams. Ask your health care provider how often you should have your eyes checked.  Personal lifestyle choices,  including:  Daily care of your teeth and gums.  Regular physical activity.  Eating a healthy diet.  Avoiding tobacco and drug use.  Limiting alcohol use.  Practicing safe sex.  Taking low doses of aspirin every day.  Taking vitamin and mineral supplements as recommended by your health care provider. What happens during an annual well check? The services and screenings done by your health care provider during your annual well check will depend on your age, overall health, lifestyle risk factors, and family history of disease. Counseling  Your health care provider may ask you questions about your:  Alcohol use.  Tobacco use.  Drug use.  Emotional well-being.  Home and relationship well-being.  Sexual activity.  Eating habits.  History of falls.  Memory and ability to understand (cognition).  Work and work Statistician. Screening  You may have the following tests or measurements:  Height, weight, and BMI.  Blood pressure.  Lipid and cholesterol levels. These may be checked every 5 years, or more frequently if you are over 80 years old.  Skin check.  Lung cancer screening. You may have this screening every year starting at age 70 if you have a 30-pack-year history of smoking and currently smoke or have quit within the past 15 years.  Fecal occult blood test (FOBT) of the stool. You may have this test every year starting at age 50.  Flexible sigmoidoscopy or colonoscopy. You may have a sigmoidoscopy every 5 years or a colonoscopy every 10 years starting at age 70.  Prostate cancer screening. Recommendations will vary depending on your  family history and other risks.  Hepatitis C blood test.  Hepatitis B blood test.  Sexually transmitted disease (STD) testing.  Diabetes screening. This is done by checking your blood sugar (glucose) after you have not eaten for a while (fasting). You may have this done every 1-3 years.  Abdominal aortic aneurysm (AAA)  screening. You may need this if you are a current or former smoker.  Osteoporosis. You may be screened starting at age 39 if you are at high risk. Talk with your health care provider about your test results, treatment options, and if necessary, the need for more tests. Vaccines  Your health care provider may recommend certain vaccines, such as:  Influenza vaccine. This is recommended every year.  Tetanus, diphtheria, and acellular pertussis (Tdap, Td) vaccine. You may need a Td booster every 10 years.  Zoster vaccine. You may need this after age 23.  Pneumococcal 13-valent conjugate (PCV13) vaccine. One dose is recommended after age 66.  Pneumococcal polysaccharide (PPSV23) vaccine. One dose is recommended after age 50. Talk to your health care provider about which screenings and vaccines you need and how often you need them. This information is not intended to replace advice given to you by your health care provider. Make sure you discuss any questions you have with your health care provider. Document Released: 07/05/2015 Document Revised: 02/26/2016 Document Reviewed: 04/09/2015 Elsevier Interactive Patient Education  2017 [place] Prevention in the Home Falls can cause injuries. They can happen to people of all ages. There are many things you can do to make your home safe and to help prevent falls. What can I do on the outside of my home?  Regularly fix the edges of walkways and driveways and fix any cracks.  Remove anything that might make you trip as you walk through a door, such as a raised step or threshold.  Trim any bushes or trees on the path to your home.  Use bright outdoor lighting.  Clear any walking paths of anything that might make someone trip, such as rocks or tools.  Regularly check to see if handrails are loose or broken. Make sure that both sides of any steps have handrails.  Any raised decks and porches should have guardrails on the  edges.  Have any leaves, snow, or ice cleared regularly.  Use sand or salt on walking paths during winter.  Clean up any spills in your garage right away. This includes oil or grease spills. What can I do in the bathroom?  Use night lights.  Install grab bars by the toilet and in the tub and shower. Do not use towel bars as grab bars.  Use non-skid mats or decals in the tub or shower.  If you need to sit down in the shower, use a plastic, non-slip stool.  Keep the floor dry. Clean up any water that spills on the floor as soon as it happens.  Remove soap buildup in the tub or shower regularly.  Attach bath mats securely with double-sided non-slip rug tape.  Do not have throw rugs and other things on the floor that can make you trip. What can I do in the bedroom?  Use night lights.  Make sure that you have a light by your bed that is easy to reach.  Do not use any sheets or blankets that are too big for your bed. They should not hang down onto the floor.  Have a firm chair that has side arms. You can  use this for support while you get dressed.  Do not have throw rugs and other things on the floor that can make you trip. What can I do in the kitchen?  Clean up any spills right away.  Avoid walking on wet floors.  Keep items that you use a lot in easy-to-reach places.  If you need to reach something above you, use a strong step stool that has a grab bar.  Keep electrical cords out of the way.  Do not use floor polish or wax that makes floors slippery. If you must use wax, use non-skid floor wax.  Do not have throw rugs and other things on the floor that can make you trip. What can I do with my stairs?  Do not leave any items on the stairs.  Make sure that there are handrails on both sides of the stairs and use them. Fix handrails that are broken or loose. Make sure that handrails are as long as the stairways.  Check any carpeting to make sure that it is firmly  attached to the stairs. Fix any carpet that is loose or worn.  Avoid having throw rugs at the top or bottom of the stairs. If you do have throw rugs, attach them to the floor with carpet tape.  Make sure that you have a light switch at the top of the stairs and the bottom of the stairs. If you do not have them, ask someone to add them for you. What else can I do to help prevent falls?  Wear shoes that:  Do not have high heels.  Have rubber bottoms.  Are comfortable and fit you well.  Are closed at the toe. Do not wear sandals.  If you use a stepladder:  Make sure that it is fully opened. Do not climb a closed stepladder.  Make sure that both sides of the stepladder are locked into place.  Ask someone to hold it for you, if possible.  Clearly mark and make sure that you can see:  Any grab bars or handrails.  First and last steps.  Where the edge of each step is.  Use tools that help you move around (mobility aids) if they are needed. These include:  Canes.  Walkers.  Scooters.  Crutches.  Turn on the lights when you go into a dark area. Replace any light bulbs as soon as they burn out.  Set up your furniture so you have a clear path. Avoid moving your furniture around.  If any of your floors are uneven, fix them.  If there are any pets around you, be aware of where they are.  Review your medicines with your doctor. Some medicines can make you feel dizzy. This can increase your chance of falling. Ask your doctor what other things that you can do to help prevent falls. This information is not intended to replace advice given to you by your health care provider. Make sure you discuss any questions you have with your health care provider. Document Released: 04/04/2009 Document Revised: 11/14/2015 Document Reviewed: 07/13/2014 Elsevier Interactive Patient Education  2017 [employer].

## 2019-12-14 NOTE — Progress Notes (Signed)
Subjective:   Timothy Turner is a 79 y.o. male who presents for Medicare Annual/Subsequent preventive examination.  Review of Systems    No ROS.  Medicare Wellness Virtual Visit.    Cardiac Risk Factors include: advanced age (>2men, >71 women);male gender     Objective:    Today's Vitals   12/14/19 1104  Weight: 174 lb (78.9 kg)  Height: 6' (1.829 m)   Body mass index is 23.6 kg/m.  Advanced Directives 12/14/2019 11/25/2018 08/30/2018 08/01/2018 11/11/2017 08/26/2017 06/17/2017  Does Patient Have a Medical Advance Directive? Yes Yes Yes Yes Yes Yes Yes  Type of Paramedic of [place];Living will [place];Living will [place];Living will Living will;Healthcare Power of [place];Out of facility DNR (pink MOST or yellow form) Living will;Healthcare Power of [place];Out of facility DNR (pink MOST or yellow form) [place];Living will [place];Living will  Does patient want to make changes to medical advance directive? No - Patient declined No - Patient declined No - Patient declined - - No - Patient declined -  Copy of [place] in Chart? No - copy requested No - copy requested No - copy requested No - copy requested No - copy requested No - copy requested No - copy requested  Would patient like information on creating a medical advance directive? - No - Patient declined - No - Patient declined No - Patient declined - -    Current Medications (verified) Outpatient Encounter Medications as of 12/14/2019  Medication Sig  . Apremilast (OTEZLA) 30 MG TABS Take 1 tablet (30 mg total) by mouth 2 (two) times daily.  [name] Apremilast (OTEZLA) 30 MG TABS Take 1 tablet (30 mg total) by mouth 2 (two) times daily.  [name] azelastine (ASTELIN) 0.1 % nasal spray Place 2 sprays into both nostrils 2 (two) times daily. Use in each nostril as directed  . Bempedoic Acid (NEXLETOL) 180 MG  TABS Take 180 mg by mouth daily.  . calcipotriene (DOVONOX) 0.005 % cream Apply topically 2 (two) times daily.   . cetirizine (ZYRTEC) 10 MG tablet Take 10 mg by mouth daily.  . Cholecalciferol (VITAMIN D3) 25 MCG (1000 UT) CAPS Take by mouth daily.   . Ciclopirox 1 % shampoo Massage into scalp, let sit 10 minutes before rinsing. May use up to daily.  . clobetasol (TEMOVATE) 0.05 % cream Apply topically as needed. Reported on 12/03/2015  . clobetasol (TEMOVATE) 0.05 % external solution Apply up to twice a day to affected areas as needed for itch. Avoid face, groin, underarms.  . Clobetasol Propionate 0.05 % shampoo Apply thin film to dry scalp once daily (maximum dose: 50 g/week or 50 mL/week); leave in place for 15 minutes, then add water, lather, and rinse thoroughly.  . Clobetasol Propionate 0.05 % shampoo Massage into scalp daily up to 4 weeks, then as needed for itch. Let sit a few minutes before rinsing.  . clonazePAM (KLONOPIN) 2 MG tablet TAKE 1 TABLET THREE TIMES A DAY AS NEEDED FOR SEVERE MUSCLE SPASM  . colestipol (COLESTID) 1 G tablet Take 3 tablets (3 g total) by mouth 2 (two) times daily.  [name] escitalopram (LEXAPRO) 10 MG tablet Take 10 mg by mouth at bedtime.   [name] esomeprazole (NEXIUM) 40 MG capsule Take 1 capsule (40 mg total) by mouth daily.  [name] ezetimibe (ZETIA) 10 MG tablet Take 1 tablet (10 mg total) by mouth daily.  . finasteride (PROSCAR) 5  MG tablet Take 1 tablet (5 mg total) by mouth daily.  . Glucosamine-Chondroitin 250-200 MG TABS Take by mouth daily.   [name] ketoconazole (NIZORAL) 2 % shampoo Apply 1 application topically 2 (two) times a week.  . metoprolol tartrate (LOPRESSOR) 25 MG tablet Take 25 mg by mouth 2 (two) times daily as needed.   . Multiple Vitamins-Minerals (CENTRUM SILVER PO) Take 1 tablet by mouth every evening.   . Plant Sterols and Stanols 450 MG CAPS Take by mouth 2 (two) times daily.  [name] RESVERATROL PO Take 1,000 mg by mouth 2 (two) times daily.  [name] SYNTHROID  75 MCG tablet TAKE 1 TABLET DAILY   No facility-administered encounter medications on file as of 12/14/2019.    Allergies (verified) Duloxetine, Nsaids, Rosuvastatin, Statins, and Tolmetin   History: Past Medical History:  Diagnosis Date  . Aspiration precautions    uses thicken in liquids.  Eats pureed food.  . Barrett esophagus   . Depression   . Gallstones   . GERD (gastroesophageal reflux disease)   . Heart murmur    History of  . Hemorrhoids   . Hypertension   . Hypothyroidism   . Iron deficiency anemia 02/10/2012  . Mitral valve disease   . Myalgia    Multiple  . Neuromuscular disorder ([place])   . PAD (peripheral artery disease) (HCC)    lower legs  . Pain syndrome, chronic    Severe  . Pneumonia    three times, last time >10 years ago  . Psoriasis   . Pulmonary embolism ([place]) 2019   left lung  . Rheumatic heart valve incompetence   . Shortness of breath dyspnea   . Thyroiditis    Past Surgical History:  Procedure Laterality Date  . BACK SURGERY    . CARDIAC CATHETERIZATION N/A 02/22/2015   Procedure: Left Heart Cath and Coronary Angiography;  Surgeon: Minna Merritts, MD;  Location: [facility];  Service: Cardiovascular;  Laterality: N/A;  . CATARACT EXTRACTION W/PHACO Left 07/29/2016   Procedure: CATARACT EXTRACTION PHACO AND INTRAOCULAR LENS PLACEMENT (IOC)  Left eye IVA Topical;  Surgeon: Leandrew Koyanagi, MD;  Location: [facility];  Service: Ophthalmology;  Laterality: Left;  . CATARACT EXTRACTION W/PHACO Right 08/19/2016   Procedure: CATARACT EXTRACTION PHACO AND INTRAOCULAR LENS PLACEMENT ([place]) right  TORIC;  Surgeon: Leandrew Koyanagi, MD;  Location: [facility];  Service: Ophthalmology;  Laterality: Right;  prefers early morning  . CHOLECYSTECTOMY  08/03/13  . COLONOSCOPY  03-08-12   Dr Donnella Sham  . COLONOSCOPY WITH PROPOFOL N/A 06/17/2017   Procedure: COLONOSCOPY WITH PROPOFOL;  Surgeon: [name], MD;  Location: [facility]  ENDOSCOPY;  Service: Gastroenterology;  Laterality: N/A;  . DIRECT LARYNGOSCOPY N/A 02/17/2016   Procedure: DIRECT LARYNGOSCOPY;  Surgeon: Jodi Marble, MD;  Location: [facility];  Service: ENT;  Laterality: N/A;  . ESOPHAGEAL MANOMETRY N/A 02/27/2015   Procedure: ESOPHAGEAL MANOMETRY (EM);  Surgeon: Lollie Sails, MD;  Location: [facility] ENDOSCOPY;  Service: Endoscopy;  Laterality: N/A;  . ESOPHAGOGASTRODUODENOSCOPY (EGD) WITH PROPOFOL N/A 02/18/2015   Procedure: ESOPHAGOGASTRODUODENOSCOPY (EGD) WITH PROPOFOL;  Surgeon: Lollie Sails, MD;  Location: [facility] ENDOSCOPY;  Service: Endoscopy;  Laterality: N/A;  . ESOPHAGOGASTRODUODENOSCOPY (EGD) WITH PROPOFOL N/A 06/17/2017   Procedure: ESOPHAGOGASTRODUODENOSCOPY (EGD) WITH PROPOFOL;  Surgeon: [name], MD;  Location: [facility] ENDOSCOPY;  Service: Gastroenterology;  Laterality: N/A;  . ESOPHAGOSCOPY W/ BOTOX INJECTION N/A 02/17/2016   Procedure: ESOPHAGOSCOPY WITH BOTOX INJECTION  ;  Surgeon: Jodi Marble,  MD;  Location: [facility];  Service: ENT;  Laterality: N/A;  . HEMORROIDECTOMY    . UPPER GI ENDOSCOPY  03-06-2013   Dr Donnella Sham   Family History  Problem Relation Age of Onset  . Heart failure Mother   . Heart failure Father   . Emphysema Father        smoked  . Cancer Sister   . Aneurysm Sister        Brain   Social History   Socioeconomic History  . Marital status: Married    Spouse name: Not on file  . Number of children: 2  . Years of education: Not on file  . Highest education level: Not on file  Occupational History  . 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 Occupation: Disabled  Tobacco Use  . Smoking status: Former Smoker    Packs/day: 1.00    Years: 30.00    Pack years: 30.00    Types: Cigarettes    Quit date: 06/22/2004    Years since quitting: 15.4  . Smokeless tobacco: Never Used  Vaping Use  . Vaping Use: Never used  Substance and Sexual Activity  . Alcohol  use: No    Alcohol/week: 0.0 standard drinks  . Drug use: No  . Sexual activity: Yes  Other Topics Concern  . Not on file  Social History Narrative   Drinks 4-5 cups of coffee a day and 3-4 cans of soda a day.   Social Determinants of Health   Financial Resource Strain:   . Difficulty of Paying Living Expenses:   Food Insecurity:   . Worried About Charity fundraiser in the Last Year:   . Arboriculturist in the Last Year:   Transportation Needs:   . Film/video editor (Medical):   [name] Lack of Transportation (Non-Medical):   Physical Activity:   . Days of Exercise per Week:   . Minutes of Exercise per Session:   Stress:   . Feeling of Stress :   Social Connections:   . Frequency of Communication with Friends and Family:   . Frequency of Social Gatherings with Friends and Family:   . Attends Religious Services:   . Active Member of Clubs or Organizations:   . Attends Archivist Meetings:   [name] Marital Status:     Tobacco Counseling Counseling given: Not Answered   Clinical Intake:  Pre-visit preparation completed: Yes        Diabetes: No  How often do you need to have someone help you when you read instructions, pamphlets, or other written materials from your doctor or pharmacy?: 1 - Never Interpreter Needed?: No      Activities of Daily Living In your present state of health, do you have any difficulty performing the following activities: 12/14/2019  Hearing? N  Vision? N  Difficulty concentrating or making decisions? N  Walking or climbing stairs? Y  Comment Chronic foot pain, followed by Dr. Mickie Bail, [place] or bathing? N  Doing errands, shopping? N  Preparing Food and eating ? Y  Comment Wife meal prep, self feeds  Using the Toilet? N  In the past six months, have you accidently leaked urine? N  Do you have problems with loss of bowel control? N  Managing your Medications? N  Managing your Finances? N  Housekeeping or managing  your Housekeeping? Y  Comment Maid assist  Some recent data might be hidden  Patient Care Team: Leone Haven, MD as PCP - General (Family Medicine) Bary Castilla, Forest Gleason, MD (General Surgery) Jackolyn Confer, MD (Internal Medicine)  Indicate any recent Medical Services you may have received from other than [facility] providers in the past year (date may be approximate).     Assessment:   This is a routine wellness examination for Cletis.  I connected with Allen today by telephone and verified that I am speaking with the correct person using two identifiers. Location patient: home Location provider: work Persons participating in the virtual visit: patient, Marine scientist.    I discussed the limitations, risks, security and privacy concerns of performing an evaluation and management service by telephone and the availability of in person appointments. I also discussed with the patient that there may be a patient responsible charge related to this service. The patient expressed understanding and verbally consented to this telephonic visit.    Interactive audio and video telecommunications were attempted between this provider and patient, however failed, due to patient having technical difficulties OR patient did not have access to video capability.  We continued and completed visit with audio only.  Some vital signs may be absent or patient reported.   Hearing/Vision screen  Hearing Screening   125Hz  250Hz  500Hz  1000Hz  2000Hz  3000Hz  4000Hz  6000Hz  8000Hz   Right ear:           Left ear:           Comments: Patient is able to hear conversational tones without difficulty.  No issues reported.  Vision Screening Comments: Followed by [facility] Cataract extraction, bilateral Visual acuity not assessed, virtual visit.  They have seen their ophthalmologist in the last 12 months.     Dietary issues and exercise activities discussed: Current Exercise Habits: The patient does not  participate in regular exercise at [place] with added protein Good water intake  Goals      Patient Stated   .  Maintain Healthy Lifestyle (pt-stated)      Depression Screen PHQ 2/9 Scores 10/16/2019 01/11/2019 08/30/2018 08/26/2017 08/26/2016 12/18/2015 11/12/2015  PHQ - 2 Score 0 0 0 0 1 0 0  PHQ- 9 Score - - - - 2 - -    Fall Risk Fall Risk  10/16/2019 01/11/2019 08/30/2018 08/26/2017 08/26/2016  Falls in the past year? 1 1 1  No No  Number falls in past yr: 0 0 0 - -  Comment - - None since May 2019 - -  Injury with Fall? 1 1 1  - -  Follow up Falls evaluation completed - Falls prevention discussed - -    Handrails in use when climbing stairs? Yes  Home free of loose throw rugs in walkways, pet beds, electrical cords, etc? Yes  Adequate lighting in your home to reduce risk of falls? Yes   ASSISTIVE DEVICES UTILIZED TO PREVENT FALLS:  Life alert? No  Use of a cane, walker or w/c? No  Grab bars in the bathroom? No  Shower chair or bench in shower? No  Elevated toilet seat or a handicapped toilet? Yes   TIMED UP AND GO:  Was the test performed? No . Virtual visit.   Cognitive Function: MMSE - Mini Mental State Exam 08/26/2017 08/26/2016  Orientation to time 5 5  Orientation to Place 5 5  Registration 3 3  Attention/ Calculation 5 5  Recall 3 3  Language- name 2 objects 2 2  Language- repeat 1 1  Language- follow 3 step command 3 3  Language- read & follow direction 1 1  Write a sentence 1 1  Copy design 1 1  Total score 30 30     6CIT Screen 08/30/2018  What Year? 0 points  What month? 0 points  What time? 0 points  Count back from 20 0 points  Months in reverse 0 points  Repeat phrase 0 points  Total Score 0    Immunizations Immunization History  Administered Date(s) Administered  . Influenza Split 03/12/2011, 03/14/2012  . Influenza, High Dose Seasonal PF 02/28/2015, 03/25/2016, 04/30/2017, 02/23/2018  . Influenza,inj,Quad PF,6+ Mos 03/31/2013, 04/24/2014    . PFIZER SARS-COV-2 Vaccination 06/28/2019, 07/19/2019  . Pneumococcal Conjugate-13 12/19/2013  . Pneumococcal Polysaccharide-23 08/12/2011  . Tdap 02/10/2012  . Zoster 02/17/2003  . Zoster Recombinat (Shingrix) 02/23/2018, 04/26/2018   Health Maintenance There are no preventive care reminders to display for this patient. Health Maintenance  Topic Date Due  . INFLUENZA VACCINE  01/21/2020  . TETANUS/TDAP  02/09/2022  . COVID-19 Vaccine  Completed  . Hepatitis C Screening  Completed  . PNA vac Low Risk Adult  Completed   Dental Screening: Recommended annual dental exams for proper oral hygiene  Community Resource Referral / Chronic Care Management: CRR required this visit?  No  CCM required this visit?  No   Plan:   Keep all routine maintenance appointments.   Follow up 02/16/20  I have personally reviewed and noted the following in the patient's chart:   . Medical and social history . Use of alcohol, tobacco or illicit drugs  . Current medications and supplements . Functional ability and status . Nutritional status . Physical activity . Advanced directives . List of other physicians . Hospitalizations, surgeries, and ER visits in previous 12 months . Vitals . Screenings to include cognitive, depression, and falls . Referrals and appointments  In addition, I have reviewed and discussed with patient certain preventive protocols, quality metrics, and best practice recommendations. A written personalized care plan for preventive services as well as general preventive health recommendations were provided to patient via mychart.     Varney Biles, LPN   [id number]/[id number]

## 2019-12-14 NOTE — Progress Notes (Signed)
I have reviewed the above note and agree.  Eloina Ergle, M.D.  

## 2020-01-15 ENCOUNTER — Telehealth: Payer: Self-pay | Admitting: Family Medicine

## 2020-01-15 DIAGNOSIS — M791 Myalgia, unspecified site: Secondary | ICD-10-CM

## 2020-01-15 MED ORDER — CLONAZEPAM 2 MG PO TABS: ORAL_TABLET | ORAL | 1 refills | Status: DC

## 2020-01-15 NOTE — Telephone Encounter (Signed)
Pt needs refill on clonazePAM (KLONOPIN) 2 MG tablet sent to [facility]

## 2020-01-15 NOTE — Telephone Encounter (Signed)
Sent to pharmacy 

## 2020-01-15 NOTE — Telephone Encounter (Signed)
On CX list for an earlier appt as well " [facility] PA said 2 months ago  "Rejection Reason - Patient Declined - Patient cx - didn't wish to r/s " [facility] PA said about 2 hours ago

## 2020-02-13 ENCOUNTER — Other Ambulatory Visit: Payer: Self-pay

## 2020-02-13 ENCOUNTER — Ambulatory Visit (INDEPENDENT_AMBULATORY_CARE_PROVIDER_SITE_OTHER): Payer: Medicare Other | Admitting: Dermatology

## 2020-02-13 DIAGNOSIS — L219 Seborrheic dermatitis, unspecified: Secondary | ICD-10-CM | POA: Diagnosis not present

## 2020-02-13 DIAGNOSIS — L820 Inflamed seborrheic keratosis: Secondary | ICD-10-CM | POA: Diagnosis not present

## 2020-02-13 DIAGNOSIS — L409 Psoriasis, unspecified: Secondary | ICD-10-CM

## 2020-02-13 MED ORDER — CLOBETASOL PROPIONATE 0.05 % EX SOLN: CUTANEOUS | 2 refills | Status: DC

## 2020-02-13 MED ORDER — CLOBETASOL PROPIONATE 0.05 % EX SHAM: MEDICATED_SHAMPOO | CUTANEOUS | 2 refills | Status: DC

## 2020-02-13 NOTE — Patient Instructions (Signed)
Clobetasol shampoo Apply thin film to dry scalp once daily; leave in place for 15 minutes, then add water, lather, and rinse thoroughly. Alternate every other day with ciclopirox shampoo.  Continue clobetasol solution - Apply 1-2 times a day to itchy spots on scalp. Avoid face.  Topical steroids (such as triamcinolone, fluocinolone, fluocinonide, mometasone, clobetasol, halobetasol, betamethasone, hydrocortisone) can cause thinning and lightening of the skin if they are used for too long in the same area. Your physician has selected the right strength medicine for your problem and area affected on the body. Please use your medication only as directed by your physician to prevent side effects.    Cryotherapy Aftercare  . Wash gently with soap and water everyday.   [name] Apply Vaseline and Band-Aid daily until healed.

## 2020-02-13 NOTE — Progress Notes (Signed)
   Follow-Up Visit   Subjective  Timothy Turner is a 79 y.o. male who presents for the following: Psoriasis (Scalp, 3 month follow-up. Patient ran out of [place] samples 2 days ago, not covered by insurance. Patient states [place] N[place] didn't help psoriasis very much. He is using clobetasol solution and ciclopirox shampoo. ). He used clobetasol shampoo for 4 weeks 2 months ago as directed, which he thinks helped more.  He has a h/o psoriasis flare on the feet years ago. He also has 2 spots on his left cheek that are irritated with shaving.   The following portions of the chart were reviewed this encounter and updated as appropriate:      Review of Systems:  No other skin or systemic complaints except as noted in HPI or Assessment and Plan.  Objective  Well appearing patient in no apparent distress; mood and affect are within normal limits.  A focused examination was performed including scalp. Relevant physical exam findings are noted in the Assessment and Plan.  Objective  Scalp: Mild erythema and scaling on occipital scalp, left crown.  Objective  Left Lower Cheek x 2 (2): Erythematous keratotic or waxy stuck-on papule    Assessment & Plan  Psoriasis Scalp  [facility], with pruritus. Restart clobetasol shampoo qod, alternating with ciclopirox shampoo. Apply thin film to dry scalp once daily; leave in place for 15 minutes, then add water, lather, and rinse thoroughly.  Continue clobetasol solution qd/bid AAs prn itch. Avoid face.  Discussed Derma-Smoothe F/S oil, patient not interested in trying at this time. Discussed Xtrac laser, but patient not interested due to distance to office.  Topical steroids (such as triamcinolone, fluocinolone, fluocinonide, mometasone, clobetasol, halobetasol, betamethasone, hydrocortisone) can cause thinning and lightening of the skin if they are used for too long in the same area. Your physician has selected the right strength medicine for your  problem and area affected on the body. Please use your medication only as directed by your physician to prevent side effects.    Reordered Medications clobetasol (TEMOVATE) 0.05 % external solution  Other Related Medications Ciclopirox 1 % shampoo  Seborrheic dermatitis  Reordered Medications clobetasol (TEMOVATE) 0.05 % external solution  Other Related Medications Ciclopirox 1 % shampoo  Inflamed seborrheic keratosis (2) Left Lower Cheek x 2  Destruction of lesion - Left Lower Cheek x 2  Destruction method: cryotherapy   Informed consent: discussed and consent obtained   Lesion destroyed using liquid nitrogen: Yes   Region frozen until ice ball extended beyond lesion: Yes   Outcome: patient tolerated procedure well with no complications   Post-procedure details: wound care instructions given    Return in about 4 months (around 06/14/2020) for Psoriasis, ISKs.   IJamesetta Orleans, CMA, am acting as scribe for Brendolyn Patty, MD .  Documentation: I have reviewed the above documentation for accuracy and completeness, and I agree with the above.  Brendolyn Patty MD

## 2020-02-16 ENCOUNTER — Ambulatory Visit (INDEPENDENT_AMBULATORY_CARE_PROVIDER_SITE_OTHER): Payer: Medicare Other | Admitting: Family Medicine

## 2020-02-16 ENCOUNTER — Encounter: Payer: Self-pay | Admitting: Family Medicine

## 2020-02-16 ENCOUNTER — Other Ambulatory Visit: Payer: Self-pay

## 2020-02-16 DIAGNOSIS — M791 Myalgia, unspecified site: Secondary | ICD-10-CM

## 2020-02-16 DIAGNOSIS — R0781 Pleurodynia: Secondary | ICD-10-CM

## 2020-02-16 DIAGNOSIS — E041 Nontoxic single thyroid nodule: Secondary | ICD-10-CM | POA: Diagnosis not present

## 2020-02-16 DIAGNOSIS — L409 Psoriasis, unspecified: Secondary | ICD-10-CM

## 2020-02-16 DIAGNOSIS — E039 Hypothyroidism, unspecified: Secondary | ICD-10-CM

## 2020-02-16 DIAGNOSIS — K2270 Barrett's esophagus without dysplasia: Secondary | ICD-10-CM

## 2020-02-16 DIAGNOSIS — K2100 Gastro-esophageal reflux disease with esophagitis, without bleeding: Secondary | ICD-10-CM
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
## 2020-02-16 NOTE — Assessment & Plan Note (Signed)
Reviewed most recent endoscopy.  He will continue on Nexium.  He will continue to periodically see GI.

## 2020-02-16 NOTE — Patient Instructions (Signed)
Nice to see you. Somebody will call you to schedule your thyroid ultrasound.  If you do not get a call in the next week or so please let us know. You can take Tylenol for your right rib pain.

## 2020-02-16 NOTE — Assessment & Plan Note (Signed)
Ultrasound ordered

## 2020-02-16 NOTE — Assessment & Plan Note (Signed)
Asymptomatic.  Continue Nexium.

## 2020-02-16 NOTE — Assessment & Plan Note (Signed)
He will continue treatment through dermatology.

## 2020-02-16 NOTE — Assessment & Plan Note (Signed)
Stable.  Continue clonazepam.

## 2020-02-16 NOTE — Assessment & Plan Note (Signed)
Ongoing issue.  Prior x-ray reassuring.  He can take Tylenol as needed.

## 2020-02-16 NOTE — Assessment & Plan Note (Signed)
TSH was recently acceptable.  We will continue Synthroid.

## 2020-02-23 ENCOUNTER — Telehealth: Payer: Self-pay | Admitting: Family Medicine

## 2020-02-23 MED ORDER — LEVOTHYROXINE SODIUM 75 MCG PO TABS: 75.0000 ug | ORAL_TABLET | Freq: Every day | ORAL | 3 refills | Status: DC

## 2020-02-23 NOTE — Telephone Encounter (Signed)
Patient called in for refill on SYNTHROID 75 MCG tablet

## 2020-02-27 ENCOUNTER — Ambulatory Visit
Admission: RE | Admit: 2020-02-27 | Discharge: 2020-02-27 | Disposition: A | Discharge status: Home / Self Care | Payer: Medicare Other | Source: Ambulatory Visit | Attending: Family Medicine | Admitting: Family Medicine

## 2020-02-27 ENCOUNTER — Other Ambulatory Visit: Payer: Self-pay

## 2020-02-27 DIAGNOSIS — E041 Nontoxic single thyroid nodule: Secondary | ICD-10-CM | POA: Insufficient documentation

## 2020-03-13 ENCOUNTER — Other Ambulatory Visit: Payer: Self-pay | Admitting: *Deleted

## 2020-03-13 MED ORDER — FINASTERIDE 5 MG PO TABS
5.0000 mg | ORAL_TABLET | Freq: Every day | ORAL | 3 refills | Status: DC
Start: 2020-03-13 — End: 2021-03-12

## 2020-04-26 ENCOUNTER — Other Ambulatory Visit: Payer: Self-pay | Admitting: Family Medicine

## 2020-04-26 DIAGNOSIS — M791 Myalgia, unspecified site: Secondary | ICD-10-CM

## 2020-05-01 ENCOUNTER — Other Ambulatory Visit: Payer: Self-pay | Admitting: Cardiovascular Disease

## 2020-05-01 DIAGNOSIS — I739 Peripheral vascular disease, unspecified: Secondary | ICD-10-CM

## 2020-05-08 ENCOUNTER — Other Ambulatory Visit: Payer: Self-pay

## 2020-05-08 ENCOUNTER — Ambulatory Visit (INDEPENDENT_AMBULATORY_CARE_PROVIDER_SITE_OTHER): Payer: Medicare Other

## 2020-05-08 DIAGNOSIS — I739 Peripheral vascular disease, unspecified: Secondary | ICD-10-CM

## 2020-05-23 NOTE — Progress Notes (Signed)
Cardiology Office Note  Date:  05/27/2020   ID:  Timothy Turner, DOB [birth year], MRN [id number]  PCP:  Leone Haven, MD   Chief Complaint  Patient presents with  . Other    12 month follow up. Meds reviewed verbally with patient    HPI:  Timothy Turner is a  79 year-old gentleman with past medical history of smoking history, PAD moderate bilateral iliac arterial disease in 2015, done at [facility] mitral valve prolapse  echocardiogram in 2009 reporting moderate to severe MR (outside study ),  hyperlipidemia,  Long prior smoking history for at least 30 years, stopped when he was in his late 44s hypertension with diffuse chronic muscle aches of uncertain etiology Cardiac catheterization September 2016 No significant coronary disease  DVT, PE EF 60% in Jan 2020 who presents for routine followup of his PAD, MR.  Elevated parathyroid number, 87, He take lots of oral calcium daily by choice, "to build strong bones"  Followed vegan diet LDL 47, Weight loss Takes OTC, cholestoff  Not walking, fat pad "gone" Has pain, "bone hitting against skin"  Has had long history of chronic muscle pain Better with benzos  Had a fall 11/2018, hurt foot Had surgery Sedentary  Was on PCSK9 inh, caused side effects, muscle ache,  stopped the medication  Stopped zetia secondary to cost  Normal ABIs 04/2020  Thinks he had reaction to vaccine, Has had 3 shots   lower extremity arterial Dopplers with him from November 2019 Lower extremity venous Doppler results from May 2019  showing occlusion of popliteal vessel Moderate iliac disease bilaterally  EKG personally reviewed by myself on todays visit Shows normal sinus rhythm rate 100 bpm no significant ST or T wave changes  Other past medical history reviewed   Fall, subsequent knee trauma, April 2019 Was very sedentary for 1 month secondary to recovering from the trauma Developed DVT and pulmonary embolism, to the  emergency room with acute shortness of breath May 2019 Left PE on CT scan 11/11/2017 Started on eliquis  Long history of muscle cramping in his legs improved on clonazepam started years ago Unable to tolerate statins secondary to muscle pain  Cardiac catheterization September 2016 No significant coronary disease to explain shortness of breath, ejection fraction 55% Suspected to have COPD and PFTs were recommended   PMH:   has a past medical history of Aspiration precautions, Barrett esophagus, Depression, Gallstones, GERD (gastroesophageal reflux disease), Heart murmur, Hemorrhoids, Hypertension, Hypothyroidism, Iron deficiency anemia (02/10/2012), Mitral valve disease, Myalgia, Neuromuscular disorder ([place]), PAD (peripheral artery disease) ([place]), Pain syndrome, chronic, Pneumonia, Psoriasis, Pulmonary embolism ([place]) (2019), Rheumatic heart valve incompetence, Shortness of breath dyspnea, and Thyroiditis.  PSH:    Past Surgical History:  Procedure Laterality Date  . BACK SURGERY    . CARDIAC CATHETERIZATION N/A 02/22/2015   Procedure: Left Heart Cath and Coronary Angiography;  Surgeon: Minna Merritts, MD;  Location: [place] [facility];  Service: Cardiovascular;  Laterality: N/A;  . CATARACT EXTRACTION W/PHACO Left 07/29/2016   Procedure: CATARACT EXTRACTION PHACO AND INTRAOCULAR LENS PLACEMENT (IOC)  Left eye IVA Topical;  Surgeon: Leandrew Koyanagi, MD;  Location: [place];  Service: Ophthalmology;  Laterality: Left;  . CATARACT EXTRACTION W/PHACO Right 08/19/2016   Procedure: CATARACT EXTRACTION PHACO AND INTRAOCULAR LENS PLACEMENT ([place]) right  TORIC;  Surgeon: Leandrew Koyanagi, MD;  Location: [place];  Service: Ophthalmology;  Laterality: Right;  prefers early morning  . CHOLECYSTECTOMY  08/03/13  . COLONOSCOPY  03-08-12   Dr Donnella Sham  . COLONOSCOPY WITH PROPOFOL N/A 06/17/2017   Procedure: COLONOSCOPY WITH PROPOFOL;  Surgeon: [name], MD;  Location: [facility]  ENDOSCOPY;  Service: Gastroenterology;  Laterality: N/A;  . DIRECT LARYNGOSCOPY N/A 02/17/2016   Procedure: DIRECT LARYNGOSCOPY;  Surgeon: Jodi Marble, MD;  Location: [place];  Service: ENT;  Laterality: N/A;  . ESOPHAGEAL MANOMETRY N/A 02/27/2015   Procedure: ESOPHAGEAL MANOMETRY (EM);  Surgeon: Lollie Sails, MD;  Location: [facility] ENDOSCOPY;  Service: Endoscopy;  Laterality: N/A;  . ESOPHAGOGASTRODUODENOSCOPY (EGD) WITH PROPOFOL N/A 02/18/2015   Procedure: ESOPHAGOGASTRODUODENOSCOPY (EGD) WITH PROPOFOL;  Surgeon: Lollie Sails, MD;  Location: [facility] ENDOSCOPY;  Service: Endoscopy;  Laterality: N/A;  . ESOPHAGOGASTRODUODENOSCOPY (EGD) WITH PROPOFOL N/A 06/17/2017   Procedure: ESOPHAGOGASTRODUODENOSCOPY (EGD) WITH PROPOFOL;  Surgeon: [name], MD;  Location: [facility] ENDOSCOPY;  Service: Gastroenterology;  Laterality: N/A;  . ESOPHAGOSCOPY W/ BOTOX INJECTION N/A 02/17/2016   Procedure: ESOPHAGOSCOPY WITH BOTOX INJECTION  ;  Surgeon: Jodi Marble, MD;  Location: [place];  Service: ENT;  Laterality: N/A;  . HEMORROIDECTOMY    . UPPER GI ENDOSCOPY  03-06-2013   Dr Donnella Sham    Current Outpatient Medications  Medication Sig Dispense Refill  . azelastine (ASTELIN) 0.1 % nasal spray Place 2 sprays into both nostrils 2 (two) times daily. Use in each nostril as directed 30 mL 6  . Bempedoic Acid (NEXLETOL) 180 MG TABS Take 180 mg by mouth daily. 90 tablet 3  . cetirizine (ZYRTEC) 10 MG tablet Take 10 mg by mouth daily.    . Cholecalciferol (VITAMIN D3) 25 MCG (1000 UT) CAPS Take by mouth daily.     . Ciclopirox 1 % shampoo Massage into scalp, let sit 10 minutes before rinsing. May use up to daily. 120 mL 2  . clobetasol (TEMOVATE) 0.05 % external solution Apply 1-2 times a day to affected areas scalp as needed for itch. Avoid face, groin, underarms. 50 mL 2  . Clobetasol Propionate 0.05 % shampoo Apply thin film to dry scalp once daily; leave in place for 15 minutes,  then add water, lather, and rinse thoroughly. 118 mL 2  . clonazePAM (KLONOPIN) 2 MG tablet TAKE ONE TABLET BY MOUTH THREE TIMES A DAY AS NEEDED FOR SEVERE MUSCLE SPASM 90 tablet 0  . colestipol (COLESTID) 1 G tablet Take 3 tablets (3 g total) by mouth 2 (two) times daily. 540 tablet 3  . escitalopram (LEXAPRO) 10 MG tablet Take 10 mg by mouth at bedtime.     [name] esomeprazole (NEXIUM) 40 MG capsule Take 1 capsule (40 mg total) by mouth daily. 90 capsule 3  . ezetimibe (ZETIA) 10 MG tablet Take 1 tablet (10 mg total) by mouth daily. 90 tablet 4  . finasteride (PROSCAR) 5 MG tablet Take 1 tablet (5 mg total) by mouth daily. 90 tablet 3  . Glucosamine-Chondroitin 250-200 MG TABS Take by mouth daily.     [name] levothyroxine (SYNTHROID) 75 MCG tablet Take 1 tablet (75 mcg total) by mouth daily. 90 tablet 3  . metoprolol tartrate (LOPRESSOR) 25 MG tablet Take 25 mg by mouth 2 (two) times daily as needed.     . Multiple Vitamins-Minerals (CENTRUM SILVER PO) Take 1 tablet by mouth every evening.     . Plant Sterols and Stanols 450 MG CAPS Take by mouth 2 (two) times daily.    [name] RESVERATROL PO Take 1,000 mg by mouth 2 (two) times daily.     No current  facility-administered medications for this visit.     Allergies:   Duloxetine, Nsaids, Rosuvastatin, Statins, and Tolmetin   Social History:  The patient  reports that he quit smoking about 15 years ago. His smoking use included cigarettes. He has a 30.00 pack-year smoking history. He has never used smokeless tobacco. He reports that he does not drink alcohol and does not use drugs.   Family History:   family history includes Aneurysm in his sister; Cancer in his sister; Emphysema in his father; Heart failure in his father and mother.    Review of Systems: Review of Systems  Constitutional: Negative.   Respiratory: Negative.   Cardiovascular: Negative.   Gastrointestinal: Negative.   Musculoskeletal: Positive for myalgias.       Foot pain   Neurological: Negative.   Psychiatric/Behavioral: Negative.   All other systems reviewed and are negative.    PHYSICAL EXAM: VS:  BP 122/72 (BP Location: Left Arm, Patient Position: Sitting, Cuff Size: Normal)   Pulse 100   Ht 6' (1.829 m)   Wt 179 lb (81.2 kg)   SpO2 96%   BMI 24.28 kg/m  , BMI Body mass index is 24.28 kg/m. Constitutional:  oriented to person, place, and time. No distress.  HENT:  Head: Grossly normal Eyes:  no discharge. No scleral icterus.  Neck: No JVD, no carotid bruits  Cardiovascular: Regular rate and rhythm, no murmurs appreciated Pulmonary/Chest: Clear to auscultation bilaterally, no wheezes or rails Abdominal: Soft.  no distension.  no tenderness.  Musculoskeletal: Normal range of motion Neurological:  normal muscle tone. Coordination normal. No atrophy Skin: Skin warm and dry Psychiatric: normal affect, pleasant   Recent Labs: 07/13/2019: Hemoglobin 14.9 11/14/2019: ALT 11; BUN 17; Creatinine, Ser 1.44; Potassium 4.4; Sodium 141; TSH 4.18    Lipid Panel Lab Results  Component Value Date   CHOL 168 01/11/2019   HDL 34.70 (L) 01/11/2019   LDLCALC 109 (H) 01/11/2019   TRIG 120.0 01/11/2019    Wt Readings from Last 3 Encounters:  05/27/20 179 lb (81.2 kg)  02/16/20 171 lb (77.6 kg)  12/14/19 174 lb (78.9 kg)     ASSESSMENT AND PLAN:  History of pulmonary embolism with acute cor pulmonale, unspecified pulmonary embolism type ([place]) Provoked DVT lower extremity, pulmonary embolism on the left documented on CT scan Echocardiogram with right heart strain Stopped anticoagulation after 6 months, in November 2019 stable since then  PVD (peripheral vascular disease) ([place]) Prior history of moderate iliac disease ABI normal range, no claudication  Chronic obstructive pulmonary disease, unspecified COPD type ([place]) Breathing stable  Chronic kidney disease (CKD), stage III (moderate) (H[facility]) Creatinine 1.4  Hyperlipidemia On zetia, with  diet change LDL 47     Total encounter time more than 25 minutes  Greater than 50% was spent in counseling and coordination of care with the patient    Orders Placed This Encounter  Procedures  . EKG 12-Lead     Signed, Esmond Plants, M.D., Ph.D. 05/27/2020  [facility] [place], [place] [phone] Ms. Sarita Haver was 1 but there were a couple I changed a couple on her and couple on other people

## 2020-05-27 ENCOUNTER — Ambulatory Visit (INDEPENDENT_AMBULATORY_CARE_PROVIDER_SITE_OTHER): Payer: Medicare Other | Admitting: Cardiovascular Disease

## 2020-05-27 ENCOUNTER — Other Ambulatory Visit: Payer: Self-pay

## 2020-05-27 ENCOUNTER — Encounter: Payer: Self-pay | Admitting: Cardiovascular Disease

## 2020-05-27 VITALS — BP 122/72 | HR 100 | Ht 72.0 in | Wt 179.0 lb

## 2020-05-27 DIAGNOSIS — N183 Chronic kidney disease, stage 3 unspecified: Secondary | ICD-10-CM | POA: Diagnosis not present

## 2020-05-27 DIAGNOSIS — I2609 Other pulmonary embolism with acute cor pulmonale: Secondary | ICD-10-CM

## 2020-05-27 DIAGNOSIS — I739 Peripheral vascular disease, unspecified: Secondary | ICD-10-CM

## 2020-05-27 DIAGNOSIS — J449 Chronic obstructive pulmonary disease, unspecified: Secondary | ICD-10-CM

## 2020-05-27 NOTE — Patient Instructions (Signed)
Medication Instructions:  No changes  If you need a refill on your cardiac medications before your next appointment, please call your pharmacy.    Lab work: No new labs needed   If you have labs (blood work) drawn today and your tests are completely normal, you will receive your results only by: . MyChart Message (if you have MyChart) OR . A paper copy in the mail If you have any lab test that is abnormal or we need to change your treatment, we will call you to review the results.   Testing/Procedures: No new testing needed   Follow-Up: At [facility], you and your health needs are our priority.  As part of our continuing mission to provide you with exceptional heart care, we have created designated Provider Care Teams.  These Care Teams include your primary Cardiologist (physician) and Advanced Practice Providers (APPs -  Physician Assistants and Nurse Practitioners) who all work together to provide you with the care you need, when you need it.  . You will need a follow up appointment in 12 months  . Providers on your designated Care Team:   . Christopher Berge, NP . Ryan Dunn, PA-C . Jacquelyn Visser, PA-C  Any Other Special Instructions Will Be Listed Below (If Applicable).  COVID-19 Vaccine Information can be found at: [email or web address] For questions related to vaccine distribution or appointments, please email [email or web address] or call [phone].     

## 2020-06-07 ENCOUNTER — Other Ambulatory Visit: Payer: Self-pay | Admitting: Family Medicine

## 2020-06-07 DIAGNOSIS — M791 Myalgia, unspecified site: Secondary | ICD-10-CM

## 2020-06-18 ENCOUNTER — Ambulatory Visit: Payer: Medicare Other | Admitting: Family Medicine

## 2020-07-01 ENCOUNTER — Ambulatory Visit (INDEPENDENT_AMBULATORY_CARE_PROVIDER_SITE_OTHER): Payer: Medicare Other | Admitting: Dermatology

## 2020-07-01 ENCOUNTER — Other Ambulatory Visit: Payer: Self-pay

## 2020-07-01 DIAGNOSIS — L309 Dermatitis, unspecified: Secondary | ICD-10-CM | POA: Diagnosis not present

## 2020-07-01 DIAGNOSIS — L820 Inflamed seborrheic keratosis: Secondary | ICD-10-CM | POA: Diagnosis not present

## 2020-07-01 DIAGNOSIS — L853 Xerosis cutis: Secondary | ICD-10-CM | POA: Diagnosis not present

## 2020-07-01 MED ORDER — DUPIXENT 300 MG/2ML ~~LOC~~ SOAJ: 600.0000 mg | Freq: Once | SUBCUTANEOUS | 0 refills | Status: AC

## 2020-07-01 MED ORDER — DUPIXENT 300 MG/2ML ~~LOC~~ SOAJ: 300.0000 mg | SUBCUTANEOUS | 11 refills | Status: DC

## 2020-07-01 MED ORDER — MOMETASONE FUROATE 0.1 % EX CREA: 1.0000 "application " | TOPICAL_CREAM | CUTANEOUS | 2 refills | Status: DC

## 2020-07-01 MED ORDER — TACROLIMUS 0.1 % EX OINT: TOPICAL_OINTMENT | CUTANEOUS | 2 refills | Status: DC

## 2020-07-01 NOTE — Patient Instructions (Signed)
Gold bond rough and bumpy moisturizer daily

## 2020-07-01 NOTE — Progress Notes (Signed)
   Follow-Up Visit   Subjective  Timothy Turner is a 80 y.o. male who presents for the following: Psoriasis (Vs [facility] derm with pruritis, Scalp, 34m f/u, Clobetasol shampoo qod, DermaSmooth FS oil bid, worse when goes to bed, hx of eczema all over body bx proven from [facility], no hx of asthma, seasonal allergies, hx of trying otezla and did not help itching), Pruritis (Post neck, eyelids, chest), and ISKs (L lower cheek x 2, 22m f/u, not completely clear ).   The following portions of the chart were reviewed this encounter and updated as appropriate:       Review of Systems:  No other skin or systemic complaints except as noted in HPI or Assessment and Plan.  Objective  Well appearing patient in no apparent distress; mood and affect are within normal limits.  A focused examination was performed including face, scalp, chest, neck. Relevant physical exam findings are noted in the Assessment and Plan.  Objective  Scalp, post neck: Pink excoriations post neck, erythema upper eyelids, mild scaling occipital scalp, light pink paps chest, neck- itching in all these areas  Objective  Neck - Anterior: Xerosis chest  Objective  L lower cheek x 2 (2): Residual stuck on waxy paps with erythema   Assessment & Plan  Dermatitis Scalp, post neck  Probable atopic- not responding to topical treatment  Atopic dermatitis (eczema) is a chronic, relapsing, pruritic condition that can significantly affect quality of life. It is often associated with allergic rhinitis and/or asthma and can require treatment with topical medications, phototherapy, or in severe cases a biologic medication called Dupixent in older children and adults.    Pending approval start Dupixent sq injections Cont Clobetasol shampoo qod to scalp Cont Ciclopirox shampoo qod to scalp Cont Derma-Smooth FS oil qd Start Mometasone cream qd/bid aa itchy rash chest, neck  Start Tacrolimus oint qd aa itchy rash eyelids/face until clear  then prn flares  Dupilumab (Dupixent) is a treatment given by injection for adults with moderate-to-severe atopic dermatitis. Goal is control of skin condition, not cure. It is given as 2 injections at the first dose followed by 1 injection ever 2 weeks thereafter.  Potential side effects include allergic reaction, herpes infections, injection site reactions and conjunctivitis (inflammation of the eyes).  The use of Dupixent requires long term medication management, including periodic office visits.    mometasone (ELOCON) 0.1 % cream - Scalp, post neck  tacrolimus (PROTOPIC) 0.1 % ointment - Scalp, post neck  Dupilumab (DUPIXENT) 300 MG/2ML SOPN - Scalp, post neck  Dupilumab (DUPIXENT) 300 MG/2ML SOPN - Scalp, post neck  Xerosis cutis Neck - Anterior  Recommend mild soap and moisturizing cream 1-2 times daily.    Recommend Gold bond rough and bumpy  Inflamed seborrheic keratosis (2) L lower cheek x 2  Destruction of lesion - L lower cheek x 2  Destruction method: cryotherapy   Informed consent: discussed and consent obtained   Lesion destroyed using liquid nitrogen: Yes   Region frozen until ice ball extended beyond lesion: Yes   Outcome: patient tolerated procedure well with no complications   Post-procedure details: wound care instructions given    Return in about 4 weeks (around 07/29/2020) for Atopic Derm.  I, Othelia Pulling, RMA, am acting as scribe for Brendolyn Patty, MD . Documentation: I have reviewed the above documentation for accuracy and completeness, and I agree with the above.  Brendolyn Patty MD

## 2020-07-04 ENCOUNTER — Telehealth: Payer: Self-pay

## 2020-07-04 NOTE — Telephone Encounter (Signed)
Pts insurance is requesting BSA% for the [place].

## 2020-07-09 NOTE — Telephone Encounter (Signed)
He has involvement of entire scalp, neck, and upper chest.  I estimate ~6% BSA involvement.

## 2020-07-12 ENCOUNTER — Other Ambulatory Visit: Payer: Self-pay

## 2020-07-12 DIAGNOSIS — N4231 Prostatic intraepithelial neoplasia: Secondary | ICD-10-CM

## 2020-07-15 ENCOUNTER — Other Ambulatory Visit: Payer: Medicare Other

## 2020-07-15 ENCOUNTER — Other Ambulatory Visit: Payer: Self-pay

## 2020-07-15 DIAGNOSIS — N4231 Prostatic intraepithelial neoplasia: Secondary | ICD-10-CM

## 2020-07-16 LAB — PSA: Prostate Specific Ag, Serum: 1.4 ng/mL (ref 0.0–4.0)

## 2020-07-18 ENCOUNTER — Ambulatory Visit: Payer: Self-pay | Admitting: Urology

## 2020-07-18 ENCOUNTER — Other Ambulatory Visit: Payer: Self-pay | Admitting: Family Medicine

## 2020-07-18 DIAGNOSIS — M791 Myalgia, unspecified site: Secondary | ICD-10-CM

## 2020-07-22 ENCOUNTER — Ambulatory Visit: Payer: Medicare Other | Admitting: Family Medicine

## 2020-07-31 ENCOUNTER — Ambulatory Visit (INDEPENDENT_AMBULATORY_CARE_PROVIDER_SITE_OTHER): Payer: Medicare Other | Admitting: Urology

## 2020-07-31 ENCOUNTER — Other Ambulatory Visit: Payer: Self-pay

## 2020-07-31 ENCOUNTER — Encounter: Payer: Self-pay | Admitting: Urology

## 2020-07-31 VITALS — BP 132/74 | HR 72 | Ht 72.0 in | Wt 176.0 lb

## 2020-07-31 DIAGNOSIS — N4232 Atypical small acinar proliferation of prostate: Secondary | ICD-10-CM | POA: Diagnosis not present

## 2020-07-31 DIAGNOSIS — N4231 Prostatic intraepithelial neoplasia: Secondary | ICD-10-CM

## 2020-07-31 DIAGNOSIS — N401 Enlarged prostate with lower urinary tract symptoms: Secondary | ICD-10-CM

## 2020-07-31 NOTE — Progress Notes (Signed)
07/31/2020 9:31 AM   Timothy Turner May 09, 1941 [id number]  Referring provider: Leone Haven, MD [address] [place],  [place] [address]  Chief Complaint  Patient presents with  . Prostate Cancer    Urologic history: 1.Elevated PSA  -prostate biopsy Dr. 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  Started on finasteride, PSA has remained stable.  -was not rebiopsied  2.Hypogonadism -Previously on topical testosterone; discontinued by PCP 2020   HPI: 80 y.o. male presents for annual follow-up.   No significant change in voiding pattern since last years visit  Remains on finasteride  Denies dysuria, gross hematuria  Denies flank, abdominal or pelvic pain  PSA 07/15/2020 1.4 (uncorrected)     PMH: Past Medical History:  Diagnosis Date  . Aspiration precautions    uses thicken in liquids.  Eats pureed food.  . Barrett esophagus   . Depression   . Gallstones   . GERD (gastroesophageal reflux disease)   . Heart murmur    History of  . Hemorrhoids   . Hypertension   . Hypothyroidism   . Iron deficiency anemia 02/10/2012  . Mitral valve disease   . Myalgia    Multiple  . Neuromuscular disorder ([facility])   . PAD (peripheral artery disease) ([facility])    lower legs  . Pain syndrome, chronic    Severe  . Pneumonia    three times, last time >10 years ago  . Psoriasis   . Pulmonary embolism ([facility]) 2019   left lung  . Rheumatic heart valve incompetence   . Shortness of breath dyspnea   . Thyroiditis     Surgical History: Past Surgical History:  Procedure Laterality Date  . BACK SURGERY    . CARDIAC CATHETERIZATION N/A 02/22/2015   Procedure: Left Heart Cath and Coronary Angiography;  Surgeon: Minna Merritts, MD;  Location: [facility];  Service: Cardiovascular;  Laterality: N/A;  . CATARACT EXTRACTION W/PHACO Left 07/29/2016   Procedure: CATARACT EXTRACTION PHACO AND INTRAOCULAR LENS  PLACEMENT ([facility])  Left eye IVA Topical;  Surgeon: Leandrew Koyanagi, MD;  Location: [facility];  Service: Ophthalmology;  Laterality: Left;  . CATARACT EXTRACTION W/PHACO Right 08/19/2016   Procedure: CATARACT EXTRACTION PHACO AND INTRAOCULAR LENS PLACEMENT ([place]) right  TORIC;  Surgeon: Leandrew Koyanagi, MD;  Location: [facility];  Service: Ophthalmology;  Laterality: Right;  prefers early morning  . CHOLECYSTECTOMY  08/03/13  . COLONOSCOPY  03-08-12   Dr Donnella Sham  . COLONOSCOPY WITH PROPOFOL N/A 06/17/2017   Procedure: COLONOSCOPY WITH PROPOFOL;  Surgeon: [name], MD;  Location: [facility] ENDOSCOPY;  Service: Gastroenterology;  Laterality: N/A;  . DIRECT LARYNGOSCOPY N/A 02/17/2016   Procedure: DIRECT LARYNGOSCOPY;  Surgeon: Jodi Marble, MD;  Location: [facility];  Service: ENT;  Laterality: N/A;  . ESOPHAGEAL MANOMETRY N/A 02/27/2015   Procedure: ESOPHAGEAL MANOMETRY (EM);  Surgeon: Lollie Sails, MD;  Location: [facility] ENDOSCOPY;  Service: Endoscopy;  Laterality: N/A;  . ESOPHAGOGASTRODUODENOSCOPY (EGD) WITH PROPOFOL N/A 02/18/2015   Procedure: ESOPHAGOGASTRODUODENOSCOPY (EGD) WITH PROPOFOL;  Surgeon: Lollie Sails, MD;  Location: [facility] ENDOSCOPY;  Service: Endoscopy;  Laterality: N/A;  . ESOPHAGOGASTRODUODENOSCOPY (EGD) WITH PROPOFOL N/A 06/17/2017   Procedure: ESOPHAGOGASTRODUODENOSCOPY (EGD) WITH PROPOFOL;  Surgeon: [name], MD;  Location: [facility] ENDOSCOPY;  Service: Gastroenterology;  Laterality: N/A;  . ESOPHAGOSCOPY W/ BOTOX INJECTION N/A 02/17/2016   Procedure: ESOPHAGOSCOPY WITH BOTOX INJECTION  ;  Surgeon: Jodi Marble, MD;  Location: [facility];  Service: ENT;  Laterality: N/A;  . HEMORROIDECTOMY    . UPPER GI ENDOSCOPY  03-06-2013   Dr Donnella Sham    Home Medications:  Allergies as of 07/31/2020      Reactions   Duloxetine    Unable to urinate   Nsaids Other (See Comments)   CANNOT TAKE NSAIDS BECAUSE OF ESOPHAGUS ISSUES CANNOT  TAKE NSAIDS BECAUSE OF ESOPHAGUS ISSUES   Rosuvastatin Other (See Comments)   SEVERE MUSCLE ACHES SEVERE MUSCLE ACHES   Statins Other (See Comments)   SEVERE MUSCLE ACHES SEVERE MUSCLE ACHES Other reaction(s): Other (See Comments) SEVERE MUSCLE ACHES   Tolmetin Other (See Comments)   CANNOT TAKE NSAIDS BECAUSE OF ESOPHAGUS ISSUES CANNOT TAKE NSAIDS BECAUSE OF ESOPHAGUS ISSUES CANNOT TAKE NSAIDS BECAUSE OF ESOPHAGUS ISSUES CANNOT TAKE NSAIDS BECAUSE OF ESOPHAGUS ISSUES      Medication List       Accurate as of July 31, 2020  9:31 AM. If you have any questions, ask your nurse or doctor.        azelastine 0.1 % nasal spray Commonly known as: ASTELIN Place 2 sprays into both nostrils 2 (two) times daily. Use in each nostril as directed   CENTRUM SILVER PO Take 1 tablet by mouth every evening.   cetirizine 10 MG tablet Commonly known as: ZYRTEC Take 10 mg by mouth daily.   Ciclopirox 1 % shampoo Massage into scalp, let sit 10 minutes before rinsing. May use up to daily.   Clobetasol Propionate 0.05 % shampoo Apply thin film to dry scalp once daily; leave in place for 15 minutes, then add water, lather, and rinse thoroughly.   clobetasol 0.05 % external solution Commonly known as: TEMOVATE Apply 1-2 times a day to affected areas scalp as needed for itch. Avoid face, groin, underarms.   clonazePAM 2 MG tablet Commonly known as: KLONOPIN TAKE 1 TABLET BY MOUTH THREE TIMES A DAY AS NEEDED FOR SEVERE MUSCLE SPAS   colestipol 1 g tablet Commonly known as: COLESTID Take 3 tablets (3 g total) by mouth 2 (two) times daily.   Dupixent 300 MG/2ML Sopn Generic drug: Dupilumab Inject 300 mg into the skin every 14 (fourteen) days. Starting at day 15 for maintenance.   escitalopram 10 MG tablet Commonly known as: LEXAPRO Take 10 mg by mouth at bedtime.   esomeprazole 40 MG capsule Commonly known as: NEXIUM Take 1 capsule (40 mg total) by mouth daily.   ezetimibe 10 MG  tablet Commonly known as: ZETIA Take 1 tablet (10 mg total) by mouth daily.   finasteride 5 MG tablet Commonly known as: PROSCAR Take 1 tablet (5 mg total) by mouth daily.   Glucosamine-Chondroitin 250-200 MG Tabs Take by mouth daily.   levothyroxine 75 MCG tablet Commonly known as: Synthroid Take 1 tablet (75 mcg total) by mouth daily.   metoprolol tartrate 25 MG tablet Commonly known as: LOPRESSOR Take 25 mg by mouth 2 (two) times daily as needed.   mometasone 0.1 % cream Commonly known as: ELOCON Apply 1 application topically as directed. Qd to bid aa itchy rash on neck and chest until clear, then prn flares   Nexletol 180 MG Tabs Generic drug: Bempedoic Acid Take 180 mg by mouth daily.   Plant Sterols and Stanols 450 MG Caps Take by mouth 2 (two) times daily.   RESVERATROL PO Take 1,000 mg by mouth 2 (two) times daily.   tacrolimus 0.1 % ointment Commonly known as: PROTOPIC Apply  topically as directed. Qd/bid aa itchy rash on eyelids and face until clear, then prn flares   Vitamin D3 25 MCG (1000 UT) Caps Take by mouth daily.       Allergies:  Allergies  Allergen Reactions  . Duloxetine     Unable to urinate  . Nsaids Other (See Comments)    CANNOT TAKE NSAIDS BECAUSE OF ESOPHAGUS ISSUES CANNOT TAKE NSAIDS BECAUSE OF ESOPHAGUS ISSUES  . Rosuvastatin Other (See Comments)    SEVERE MUSCLE ACHES SEVERE MUSCLE ACHES  . Statins Other (See Comments)    SEVERE MUSCLE ACHES SEVERE MUSCLE ACHES Other reaction(s): Other (See Comments) SEVERE MUSCLE ACHES  . Tolmetin Other (See Comments)    CANNOT TAKE NSAIDS BECAUSE OF ESOPHAGUS ISSUES CANNOT TAKE NSAIDS BECAUSE OF ESOPHAGUS ISSUES CANNOT TAKE NSAIDS BECAUSE OF ESOPHAGUS ISSUES CANNOT TAKE NSAIDS BECAUSE OF ESOPHAGUS ISSUES    Family History: Family History  Problem Relation Age of Onset  . Heart failure Mother   . Heart failure Father   . Emphysema Father        smoked  . Cancer Sister   . Aneurysm  Sister        Brain    Social History:  reports that he quit smoking about 16 years ago. His smoking use included cigarettes. He has a 30.00 pack-year smoking history. He has never used smokeless tobacco. He reports that he does not drink alcohol and does not use drugs.   Physical Exam: BP 132/74   Pulse 72   Ht 6' (1.829 m)   Wt 176 lb (79.8 kg)   BMI 23.87 kg/m   Constitutional:  Alert and oriented, No acute distress. HEENT: [place] AT, moist mucus membranes.  Trachea midline, no masses. Cardiovascular: No clubbing, cyanosis, or edema. Respiratory: Normal respiratory effort, no increased work of breathing GU: Prostate 35 g, smooth without nodules Lymph: No cervical or inguinal lymphadenopathy. Skin: No rashes, bruises or suspicious lesions. Neurologic: Grossly intact, no focal deficits, moving all 4 extremities. Psychiatric: Normal mood and affect.   Assessment & Plan:    1.  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  BPH with LUTS  Did not need a finasteride refill   Abbie Sons, MD  [facility] [address] [place], [place] [address] [phone]

## 2020-08-05 ENCOUNTER — Ambulatory Visit: Payer: Medicare Other | Admitting: Dermatology

## 2020-08-06 ENCOUNTER — Other Ambulatory Visit: Payer: Self-pay

## 2020-08-06 MED ORDER — DUPIXENT 300 MG/2ML ~~LOC~~ SOSY: 300.0000 mg | PREFILLED_SYRINGE | SUBCUTANEOUS | 5 refills | Status: DC

## 2020-08-06 MED ORDER — DUPIXENT 300 MG/2ML ~~LOC~~ SOSY: 600.0000 mg | PREFILLED_SYRINGE | Freq: Once | SUBCUTANEOUS | 0 refills | Status: AC

## 2020-08-06 NOTE — Progress Notes (Signed)
[facility] needing new RX to process Dupixent.

## 2020-08-13 ENCOUNTER — Other Ambulatory Visit: Payer: Self-pay | Admitting: Dermatology

## 2020-08-15 ENCOUNTER — Ambulatory Visit (INDEPENDENT_AMBULATORY_CARE_PROVIDER_SITE_OTHER): Payer: Medicare Other

## 2020-08-15 ENCOUNTER — Other Ambulatory Visit: Payer: Self-pay

## 2020-08-15 DIAGNOSIS — L209 Atopic dermatitis, unspecified: Secondary | ICD-10-CM

## 2020-08-15 MED ORDER — DUPILUMAB 300 MG/2ML ~~LOC~~ SOSY
600.0000 mg | PREFILLED_SYRINGE | Freq: Once | SUBCUTANEOUS | Status: AC
Start: 2020-08-15 — End: 2020-08-15
  Administered 2020-08-15: 600 mg via SUBCUTANEOUS

## 2020-08-15 NOTE — Progress Notes (Signed)
Patient here for loading dose of Dupixent. Medication was shipped to patient's home yesterday.  Dupixent 600mg /46ml injected into both right and left upper arms. Patient tolerated well.   Patient advised to keep follow up appointment in two weeks with Dr. Nicole Kindred. Patient will bring next injections to his office visit.   LOT: [id number] EXP: 12/2022

## 2020-08-28 ENCOUNTER — Other Ambulatory Visit: Payer: Self-pay

## 2020-08-28 ENCOUNTER — Ambulatory Visit (INDEPENDENT_AMBULATORY_CARE_PROVIDER_SITE_OTHER): Payer: Medicare Other | Admitting: Dermatology

## 2020-08-28 DIAGNOSIS — L309 Dermatitis, unspecified: Secondary | ICD-10-CM | POA: Diagnosis not present

## 2020-08-28 DIAGNOSIS — B351 Tinea unguium: Secondary | ICD-10-CM | POA: Diagnosis not present

## 2020-08-28 MED ORDER — DUPILUMAB 300 MG/2ML ~~LOC~~ SOSY
300.0000 mg | PREFILLED_SYRINGE | Freq: Once | SUBCUTANEOUS | Status: AC
  Administered 2020-08-28: 300 mg via SUBCUTANEOUS

## 2020-08-28 NOTE — Patient Instructions (Addendum)
Dupilumab (Dupixent) is a treatment given by injection for adults with moderate-to-severe atopic dermatitis. Goal is control of skin condition, not cure. It is given as 2 injections at the first dose followed by 1 injection ever 2 weeks thereafter.  Potential side effects include allergic reaction, herpes infections, injection site reactions and conjunctivitis (inflammation of the eyes).  The use of Dupixent requires long term medication management, including periodic office visits.   Topical steroids (such as triamcinolone, fluocinolone, fluocinonide, mometasone, clobetasol, halobetasol, betamethasone, hydrocortisone) can cause thinning and lightening of the skin if they are used for too long in the same area. Your physician has selected the right strength medicine for your problem and area affected on the body. Please use your medication only as directed by your physician to prevent side effects.

## 2020-08-28 NOTE — Progress Notes (Signed)
   Follow-Up Visit   Subjective  Timothy Turner is a 80 y.o. male who presents for the following: Follow-up (Patient here for 6 week follow-up Dermatitis, probable Atopic since not responding to topicals. He had his loading dose of Dupixent injections 600mg /mL 2 weeks ago. He hasn't noticed any improvement yet. He is also using ciclopirox shampoo qod, clobetasol shampoo qod, clobetasol solution, mometasone cream, and tacrolimus ointment.).  He also wants his thumbnails checked.   The following portions of the chart were reviewed this encounter and updated as appropriate:       Review of Systems:  No other skin or systemic complaints except as noted in HPI or Assessment and Plan.  Objective  Well appearing patient in no apparent distress; mood and affect are within normal limits.  A focused examination was performed including face, scalp, trunk. Relevant physical exam findings are noted in the Assessment and Plan.  Objective  eyelids, scalp, post neck: Periocular mild erythema; mild pink scaliness of the occipital scalp, still itching in both areas  Objective  thumb nails: Bil thumb nails with onycholysis and discoloration.   Assessment & Plan  Dermatitis eyelids, scalp, post neck  With persistent pruritus  Patient just started Dupixent- got loading dose 2 weeks ago. Continue Dupixent injections 300mg /mL sq q 2 weeks. Patient self-injected Dupixent 300mg /mL sq to the left thigh in-office today. Patient tolerated well. He will continue to self-inject at home q 2 weeks. Will take about 3 months of use to assess if Dupixent is working for him  Cont Clobetasol shampoo qod to scalp. Apply to dry scalp x 10 minutes before rinsing. Cont Ciclopirox shampoo qod to scalp. May switch and use [employer] clinical instead of Ciclopirox. Cont Mometasone cream qd/bid aa itchy rash chest, neck prn. Avoid face, groin, axilla.  Cont Tacrolimus oint qd aa itchy rash eyelids/face until  clear then prn flares.  Topical steroids (such as triamcinolone, fluocinolone, fluocinonide, mometasone, clobetasol, halobetasol, betamethasone, hydrocortisone) can cause thinning and lightening of the skin if they are used for too long in the same area. Your physician has selected the right strength medicine for your problem and area affected on the body. Please use your medication only as directed by your physician to prevent side effects.   Dupilumab (Dupixent) is a treatment given by injection for adults with moderate-to-severe atopic dermatitis. Goal is control of skin condition, not cure. It is given as 2 injections at the first dose followed by 1 injection ever 2 weeks thereafter.  Potential side effects include allergic reaction, herpes infections, injection site reactions and conjunctivitis (inflammation of the eyes).  The use of Dupixent requires long term medication management, including periodic office visits.   Other Related Medications mometasone (ELOCON) 0.1 % cream tacrolimus (PROTOPIC) 0.1 % ointment  Tinea unguium thumb nails  vs Psoriasis  Start Jublia Topical Solution qhs AA nails. Sample given. Lot [id number] G Exp 04/2021  Return in about 10 weeks (around 11/06/2020) for dermatitis, on Dupixent.  IJamesetta Orleans, CMA, am acting as scribe for Brendolyn Patty, MD .  Documentation: I have reviewed the above documentation for accuracy and completeness, and I agree with the above.  Brendolyn Patty MD

## 2020-08-30 ENCOUNTER — Other Ambulatory Visit: Payer: Self-pay

## 2020-09-03 ENCOUNTER — Encounter: Payer: Self-pay | Admitting: Family Medicine

## 2020-09-03 ENCOUNTER — Other Ambulatory Visit: Payer: Self-pay

## 2020-09-03 ENCOUNTER — Telehealth (INDEPENDENT_AMBULATORY_CARE_PROVIDER_SITE_OTHER): Payer: Medicare Other | Admitting: Family Medicine

## 2020-09-03 DIAGNOSIS — E041 Nontoxic single thyroid nodule: Secondary | ICD-10-CM

## 2020-09-03 DIAGNOSIS — E039 Hypothyroidism, unspecified: Secondary | ICD-10-CM

## 2020-09-03 DIAGNOSIS — M6530 Trigger finger, unspecified finger: Secondary | ICD-10-CM | POA: Insufficient documentation

## 2020-09-03 DIAGNOSIS — M791 Myalgia, unspecified site: Secondary | ICD-10-CM

## 2020-09-03 DIAGNOSIS — N1832 Chronic kidney disease, stage 3b: Secondary | ICD-10-CM

## 2020-09-03 DIAGNOSIS — M65342 Trigger finger, left ring finger: Secondary | ICD-10-CM | POA: Diagnosis not present

## 2020-09-03 DIAGNOSIS — L409 Psoriasis, unspecified: Secondary | ICD-10-CM

## 2020-09-03 DIAGNOSIS — Z8616 Personal history of COVID-19: Secondary | ICD-10-CM | POA: Insufficient documentation

## 2020-09-03 NOTE — Assessment & Plan Note (Signed)
Presumed history of COVID-19 given description of prior symptoms.  He has improved though does continue to have some generalized weakness.  I encouraged him to try to build up his stamina by walking on flat ground.  He will monitor.

## 2020-09-03 NOTE — Assessment & Plan Note (Signed)
Has been relatively stable.  We will plan on rechecking his labs at his next visit.  He will avoid NSAIDs.  Continue to monitor blood pressure closely.

## 2020-09-03 NOTE — Progress Notes (Signed)
Virtual Visit via telephone Note  This visit type was conducted due to national recommendations for restrictions regarding the COVID-19 pandemic (e.g. social distancing).  This format is felt to be most appropriate for this patient at this time.  All issues noted in this document were discussed and addressed.  No physical exam was performed (except for noted visual exam findings with Video Visits).   I connected with Timothy Turner today at  8:00 AM EDT by telephone and verified that I am speaking with the correct person using two identifiers. Location patient: home Location provider: work Persons participating in the virtual visit: patient, provider  I discussed the limitations, risks, security and privacy concerns of performing an evaluation and management service by telephone and the availability of in person appointments. I also discussed with the patient that there may be a patient responsible charge related to this service. The patient expressed understanding and agreed to proceed.  Interactive audio and video telecommunications were attempted between this provider and patient, however failed, due to patient having technical difficulties OR patient did not have access to video capability.  We continued and completed visit with audio only.   Reason for visit: f/u  HPI: HYPOTHYROIDISM Disease Monitoring Skin Changes: has psoriasis Heat/Cold intolerance: no  Medication Monitoring Compliance:  Taking synthroid   Last TSH:   Lab Results  Component Value Date   TSH 4.18 11/14/2019   Chronic muscle pain: Patient notes clonazepam is quite beneficial.  He will have discomfort if he does not take this.  He takes it 3 times a day.  No drowsiness.  Psoriasis: Patient notes it was not getting better.  His dermatologist placed him on [place].  He notes itching in his head, face, and chest.  He has been on the [place] for 3 weeks.  He continues to follow with dermatology.  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  They advised keeping blood pressure adequately controlled and avoiding NSAIDs.  The patient notes they question his thyroid nodule and whether or not it needed to be biopsied.  Thyroid nodule: This has pretty consistently been followed with thyroid ultrasounds.  Most recent thyroid ultrasound recommended continued yearly follow-up by ultrasound based on ACR TI-RADS recommendations.  Respiratory illness: Patient notes he believes he had Covid back in January.  He had elevated temperature as well as difficulty sleeping, headaches, and weakness.  He notes he had every symptom that is listed on the Internet for COVID-19.  He improved from his acute illness though does continue to feel weak though this is progressively improving.  Trigger finger: This is in his ring finger on his right hand.  If he closes his hand it hangs and catches.  He will pop it open.  He does follow with orthopedics and notes he is going to contact them for follow-up.   ROS: See pertinent positives and negatives per HPI.  Past Medical History:  Diagnosis Date  . Aspiration precautions    uses thicken in liquids.  Eats pureed food.  . Barrett esophagus   . Depression   . Gallstones   . GERD (gastroesophageal reflux disease)   . Heart murmur    History of  . Hemorrhoids   . Hypertension   . Hypothyroidism   . Iron deficiency anemia 02/10/2012  . Mitral valve disease   . Myalgia    Multiple  . Neuromuscular disorder ([place])   . PAD (peripheral artery disease) (HCC)    lower legs  .  Pain syndrome, chronic    Severe  . Pneumonia    three times, last time >10 years ago  . Psoriasis   . Pulmonary embolism ([place]) 2019   left lung  . Rheumatic heart valve incompetence   . Shortness of breath dyspnea   . Thyroiditis     Past Surgical History:  Procedure Laterality Date  . BACK SURGERY    . CARDIAC CATHETERIZATION N/A 02/22/2015   Procedure: Left Heart Cath and Coronary  Angiography;  Surgeon: Minna Merritts, MD;  Location: [place] [facility];  Service: Cardiovascular;  Laterality: N/A;  . CATARACT EXTRACTION W/PHACO Left 07/29/2016   Procedure: CATARACT EXTRACTION PHACO AND INTRAOCULAR LENS PLACEMENT (I[facility])  Left eye IVA Topical;  Surgeon: Leandrew Koyanagi, MD;  Location: [place];  Service: Ophthalmology;  Laterality: Left;  . CATARACT EXTRACTION W/PHACO Right 08/19/2016   Procedure: CATARACT EXTRACTION PHACO AND INTRAOCULAR LENS PLACEMENT ([place]) right  TORIC;  Surgeon: Leandrew Koyanagi, MD;  Location: [place];  Service: Ophthalmology;  Laterality: Right;  prefers early morning  . CHOLECYSTECTOMY  08/03/13  . COLONOSCOPY  03-08-12   Dr Donnella Sham  . COLONOSCOPY WITH PROPOFOL N/A 06/17/2017   Procedure: COLONOSCOPY WITH PROPOFOL;  Surgeon: [name], MD;  Location: [facility] ENDOSCOPY;  Service: Gastroenterology;  Laterality: N/A;  . DIRECT LARYNGOSCOPY N/A 02/17/2016   Procedure: DIRECT LARYNGOSCOPY;  Surgeon: Jodi Marble, MD;  Location: [place];  Service: ENT;  Laterality: N/A;  . ESOPHAGEAL MANOMETRY N/A 02/27/2015   Procedure: ESOPHAGEAL MANOMETRY (EM);  Surgeon: Lollie Sails, MD;  [facility]: [facility] ENDOSCOPY;  Service: Endoscopy;  Laterality: N/A;  . ESOPHAGOGASTRODUODENOSCOPY (EGD) WITH PROPOFOL N/A 02/18/2015   Procedure: ESOPHAGOGASTRODUODENOSCOPY (EGD) WITH PROPOFOL;  Surgeon: Lollie Sails, MD;  Location: [facility] ENDOSCOPY;  Service: Endoscopy;  Laterality: N/A;  . ESOPHAGOGASTRODUODENOSCOPY (EGD) WITH PROPOFOL N/A 06/17/2017   Procedure: ESOPHAGOGASTRODUODENOSCOPY (EGD) WITH PROPOFOL;  Surgeon: [name], MD;  Location: [facility] ENDOSCOPY;  Service: Gastroenterology;  Laterality: N/A;  . ESOPHAGOSCOPY W/ BOTOX INJECTION N/A 02/17/2016   Procedure: ESOPHAGOSCOPY WITH BOTOX INJECTION  ;  Surgeon: Jodi Marble, MD;  Location: [place];  Service: ENT;  Laterality: N/A;  . HEMORROIDECTOMY    . UPPER GI  ENDOSCOPY  03-06-2013   Dr Donnella Sham    Family History  Problem Relation Age of Onset  . Heart failure Mother   . Heart failure Father   . Emphysema Father        smoked  . Cancer Sister   . Aneurysm Sister        Brain    SOCIAL HX: Former smoker   Current Outpatient Medications:  .  azelastine (ASTELIN) 0.1 % nasal spray, Place 2 sprays into both nostrils 2 (two) times daily. Use in each nostril as directed, Disp: 30 mL, Rfl: 6 .  Bempedoic Acid (NEXLETOL) 180 MG TABS, Take 180 mg by mouth daily., Disp: 90 tablet, Rfl: 3 .  cetirizine (ZYRTEC) 10 MG tablet, Take 10 mg by mouth daily., Disp: , Rfl:  .  Cholecalciferol (VITAMIN D3) 25 MCG (1000 UT) CAPS, Take by mouth daily. , Disp: , Rfl:  .  Ciclopirox 1 % shampoo, Massage into scalp, let sit 10 minutes before rinsing. May use up to daily., Disp: 120 mL, Rfl: 2 .  clobetasol (TEMOVATE) 0.05 % external solution, Apply 1-2 times a day to affected areas scalp as needed for itch. Avoid face, groin, underarms., Disp: 50 mL, Rfl: 2 .  Clobetasol Propionate  0.05 % shampoo, Apply thin film to dry scalp once daily; leave in place for 15 minutes, then add water, lather, and rinse thoroughly., Disp: 118 mL, Rfl: 2 .  clonazePAM (KLONOPIN) 2 MG tablet, TAKE 1 TABLET BY MOUTH THREE TIMES A DAY AS NEEDED FOR SEVERE MUSCLE SPAS, Disp: 90 tablet, Rfl: 0 .  colestipol (COLESTID) 1 G tablet, Take 3 tablets (3 g total) by mouth 2 (two) times daily., Disp: 540 tablet, Rfl: 3 .  dupilumab (DUPIXENT) 300 MG/2ML prefilled syringe, Inject 300 mg into the skin every 14 (fourteen) days. Starting at day 15 for maintenance., Disp: 4 mL, Rfl: 5 .  escitalopram (LEXAPRO) 10 MG tablet, Take 10 mg by mouth at bedtime. , Disp: , Rfl:  .  esomeprazole (NEXIUM) 40 MG capsule, Take 1 capsule (40 mg total) by mouth daily., Disp: 90 capsule, Rfl: 3 .  ezetimibe (ZETIA) 10 MG tablet, Take 1 tablet (10 mg total) by mouth daily., Disp: 90 tablet, Rfl: 4 .  finasteride  (PROSCAR) 5 MG tablet, Take 1 tablet (5 mg total) by mouth daily., Disp: 90 tablet, Rfl: 3 .  Glucosamine-Chondroitin 250-200 MG TABS, Take by mouth daily. , Disp: , Rfl:  .  levothyroxine (SYNTHROID) 75 MCG tablet, Take 1 tablet (75 mcg total) by mouth daily., Disp: 90 tablet, Rfl: 3 .  metoprolol tartrate (LOPRESSOR) 25 MG tablet, Take 25 mg by mouth 2 (two) times daily as needed. , Disp: , Rfl:  .  mometasone (ELOCON) 0.1 % cream, Apply 1 application topically as directed. Qd to bid aa itchy rash on neck and chest until clear, then prn flares, Disp: 50 g, Rfl: 2 .  Multiple Vitamins-Minerals (CENTRUM SILVER PO), Take 1 tablet by mouth every evening. , Disp: , Rfl:  .  Plant Sterols and Stanols 450 MG CAPS, Take by mouth 2 (two) times daily., Disp: , Rfl:  .  RESVERATROL PO, Take 1,000 mg by mouth 2 (two) times daily., Disp: , Rfl:  .  tacrolimus (PROTOPIC) 0.1 % ointment, Apply topically as directed. Qd/bid aa itchy rash on eyelids and face until clear, then prn flares, Disp: 30 g, Rfl: 2  EXAM:  VITALS per patient if applicable:  GENERAL: alert, oriented, appears well and in no acute distress  HEENT: atraumatic, conjunttiva clear, no obvious abnormalities on inspection of external nose and ears  NECK: normal movements of the head and neck  LUNGS: on inspection no signs of respiratory distress, breathing rate appears normal, no obvious gross SOB, gasping or wheezing  CV: no obvious cyanosis  MS: moves all visible extremities without noticeable abnormality  PSYCH/NEURO: pleasant and cooperative, no obvious depression or anxiety, speech and thought processing grossly intact  ASSESSMENT AND PLAN:  Discussed the following assessment and plan:  Problem List Items Addressed This Visit    Chronic kidney disease (CKD), stage III (moderate) (HCC)    Has been relatively stable.  We will plan on rechecking his labs at his next visit.  He will avoid NSAIDs.  Continue to monitor blood  pressure closely.      History of COVID-19    Presumed history of COVID-19 given description of prior symptoms.  He has improved though does continue to have some generalized weakness.  I encouraged him to try to build up his stamina by walking on flat ground.  He will monitor.      Hypothyroidism    Patient is due for lab work.  He defers until his next visit in 3  months.  He will continue Synthroid 75 mcg once daily.      Myalgia    Stable.  He can continue clonazepam 2 mg by mouth 3 times daily.      Psoriasis    The patient will continue to follow with his dermatologist.      Thyroid nodule    This has been adequately monitored.  He needs follow-up ultrasound in September of this year.  Patient is aware of this.  Plan to place an order during a future visit.      Trigger finger    The patient will contact his orthopedist for treatment.          I discussed the assessment and treatment plan with the patient. The patient was provided an opportunity to ask questions and all were answered. The patient agreed with the plan and demonstrated an understanding of the instructions.   The patient was advised to call back or seek an in-person evaluation if the symptoms worsen or if the condition fails to improve as anticipated.  I provided 14 minutes of non-face-to-face time during this encounter.   [name], MD

## 2020-09-03 NOTE — Assessment & Plan Note (Signed)
Stable.  He can continue clonazepam 2 mg by mouth 3 times daily.

## 2020-09-03 NOTE — Assessment & Plan Note (Signed)
The patient will contact his orthopedist for treatment.

## 2020-09-03 NOTE — Assessment & Plan Note (Signed)
Patient is due for lab work.  He defers until his next visit in 3 months.  He will continue Synthroid 75 mcg once daily.

## 2020-09-03 NOTE — Assessment & Plan Note (Signed)
This has been adequately monitored.  He needs follow-up ultrasound in September of this year.  Patient is aware of this.  Plan to place an order during a future visit.

## 2020-09-03 NOTE — Assessment & Plan Note (Signed)
The patient will continue to follow with his dermatologist.

## 2020-09-10 ENCOUNTER — Other Ambulatory Visit: Payer: Self-pay | Admitting: Family Medicine

## 2020-09-10 DIAGNOSIS — M791 Myalgia, unspecified site: Secondary | ICD-10-CM

## 2020-09-11 ENCOUNTER — Other Ambulatory Visit: Payer: Self-pay

## 2020-09-11 ENCOUNTER — Ambulatory Visit (INDEPENDENT_AMBULATORY_CARE_PROVIDER_SITE_OTHER): Payer: Medicare Other | Admitting: Dermatology

## 2020-09-11 DIAGNOSIS — H1032 Unspecified acute conjunctivitis, left eye: Secondary | ICD-10-CM | POA: Diagnosis not present

## 2020-09-11 MED ORDER — DOXYCYCLINE HYCLATE 100 MG PO CAPS: 100.0000 mg | ORAL_CAPSULE | Freq: Two times a day (BID) | ORAL | 0 refills | Status: AC

## 2020-09-11 NOTE — Patient Instructions (Addendum)
  Okay to use generic Maxitrol eye drops 3 times daily as directed to affected eye. Cold compresses 3 times daily  Start Doxycycline 100mg  1 capsule by mouth with food for 10 days  Call eye doctor if not better in 3 days.   If you have any questions or concerns for your doctor, please call our main line at [phone] and press option 4 to reach your doctor's medical assistant. If no one answers, please leave a voicemail as directed and we will return your call as soon as possible. Messages left after 4 pm will be answered the following business day.   You may also send [place] a message via [place]. We typically respond to MyChart messages within 1-2 business days.  For prescription refills, please ask your pharmacy to contact our office. Our fax number is [phone].  If you have an urgent issue when the clinic is closed that cannot wait until the next business day, you can page your doctor at the number below.    Please note that while we do our best to be available for urgent issues outside of office hours, we are not available 24/7.   If you have an urgent issue and are unable to reach [place], you may choose to seek medical care at your doctor's office, retail clinic, urgent care center, or emergency room.  If you have a medical emergency, please immediately call 911 or go to the emergency department.  Pager Numbers  - Dr. Nehemiah Massed: [phone]  - Dr. Laurence Ferrari: [phone]  - Dr. Nicole Kindred: [phone]  In the event of inclement weather, please call our main line at [phone] for an update on the status of any delays or closures.  Dermatology Medication Tips: Please keep the boxes that topical medications come in in order to help keep track of the instructions about where and how to use these. Pharmacies typically print the medication instructions only on the boxes and not directly on the medication tubes.   If your medication is too expensive, please contact our office at  [phone] option 4 or send [place] a message through [place].   We are unable to tell what your co-pay for medications will be in advance as this is different depending on your insurance coverage. However, we may be able to find a substitute medication at lower cost or fill out paperwork to get insurance to cover a needed medication.   If a prior authorization is required to get your medication covered by your insurance company, please allow [place] 1-2 business days to complete this process.  Drug prices often vary depending on where the prescription is filled and some pharmacies may offer cheaper prices.  The website www.goodrx.com contains coupons for medications through different pharmacies. The prices here do not account for what the cost may be with help from insurance (it may be cheaper with your insurance), but the website can give you the price if you did not use any insurance.  - You can print the associated coupon and take it with your prescription to the pharmacy.  - You may also stop by our office during regular business hours and pick up a GoodRx coupon card.  - If you need your prescription sent electronically to a different pharmacy, notify our office through [facility] or by phone at [phone] option 4.

## 2020-09-11 NOTE — Progress Notes (Signed)
   Follow-Up Visit   Subjective  Timothy Turner is a 80 y.o. male who presents for the following: other (Check left eye/eyelid. Dur: 6 days. Red, painful, itching, burning, painful. Cannot sleep. Started Dupixent 08/15/2020, is due for 3rd injection today. Wonders if is related to the Dupixent injections. ).  Wife with patient.   The following portions of the chart were reviewed this encounter and updated as appropriate:      Review of Systems: No other skin or systemic complaints except as noted in HPI or Assessment and Plan.  Objective  Well appearing patient in no apparent distress; mood and affect are within normal limits.  A focused examination was performed including head, including the scalp, face, neck, nose, ears, eyelids, and lips. Relevant physical exam findings are noted in the Assessment and Plan.  Objective  Left Conjunctiva: Moderate erythema of conjunctiva, with yellow exudate at medial canthus Left eye. Right eye is clear  Assessment & Plan  Acute conjunctivitis of left eye, unspecified acute conjunctivitis type Left Conjunctiva  Likely viral or bacterial, doubt Dupixent as cause. Okay to use Maxitrol eye drops TID as directed (Wife has unopened Rx) Warm compresses TID  Start Doxycycline 100mg  1 po BID with food 10 days  If no improvement or worsening noted after 3 days of treatment, he should contact his eye doctor to be seen.  doxycycline (VIBRAMYCIN) 100 MG capsule - Left Conjunctiva  Return for as scheduled to recheck eczema/Dupixent.   I, Emelia Salisbury, CMA, am acting as scribe for Brendolyn Patty, MD.  Documentation: I have reviewed the above documentation for accuracy and completeness, and I agree with the above.  Brendolyn Patty MD

## 2020-10-08 ENCOUNTER — Other Ambulatory Visit: Payer: Self-pay | Admitting: Family Medicine

## 2020-10-25 ENCOUNTER — Other Ambulatory Visit: Payer: Self-pay | Admitting: Family Medicine

## 2020-10-25 DIAGNOSIS — M791 Myalgia, unspecified site: Secondary | ICD-10-CM

## 2020-10-25 NOTE — Telephone Encounter (Signed)
RX Refill:klonopin Last Seen:09-03-20 Last ordered:08-21-20

## 2020-11-04 ENCOUNTER — Ambulatory Visit (INDEPENDENT_AMBULATORY_CARE_PROVIDER_SITE_OTHER): Payer: Medicare Other | Admitting: Dermatology

## 2020-11-04 ENCOUNTER — Other Ambulatory Visit: Payer: Self-pay

## 2020-11-04 DIAGNOSIS — D692 Other nonthrombocytopenic purpura: Secondary | ICD-10-CM | POA: Diagnosis not present

## 2020-11-04 DIAGNOSIS — L309 Dermatitis, unspecified: Secondary | ICD-10-CM | POA: Diagnosis not present

## 2020-11-04 DIAGNOSIS — L219 Seborrheic dermatitis, unspecified: Secondary | ICD-10-CM

## 2020-11-04 MED ORDER — KETOCONAZOLE 2 % EX CREA
TOPICAL_CREAM | CUTANEOUS | 3 refills | Status: DC
Start: 2020-11-04 — End: 2021-11-04

## 2020-11-04 MED ORDER — CICLOPIROX 1 % EX SHAM: MEDICATED_SHAMPOO | CUTANEOUS | 2 refills | Status: DC

## 2020-11-04 MED ORDER — CLOBETASOL PROPIONATE 0.05 % EX SHAM: MEDICATED_SHAMPOO | CUTANEOUS | 1 refills | Status: DC

## 2020-11-04 NOTE — Progress Notes (Signed)
Follow-Up Visit   Subjective  Timothy Turner is a 80 y.o. male who presents for the following: Follow-up (Patient here for 10 week follow-up Dermatitis. He is on Dupixent injections (started 08/15/20 and no problems injecting, no side effects), clobetasol shampoo qod, ciclopirox shampoo qod, clobetasol solution prn scalp, mometasone cream prn, and tacrolimus ointment. His body has improved, but scalp still pretty itchy. He still has trouble with redness, pain, and burning of his left eye. 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 He is using a steroid drop and gel. Eye has improved some.).  He sees his eye doctor regularly for that.   The following portions of the chart were reviewed this encounter and updated as appropriate:       Review of Systems:  No other skin or systemic complaints except as noted in HPI or Assessment and Plan.  Objective  Well appearing patient in no apparent distress; mood and affect are within normal limits.  A focused examination was performed including face, trunk, scalp. Relevant physical exam findings are noted in the Assessment and Plan.  Objective  chest, neck: Skin clear today.  Objective  Scalp: Greasy scaling, mild erythema of the glabella, temples; mild erythema and scale of the scalp.   Assessment & Plan   Purpura - Chronic; persistent and recurrent.  Treatable, but not curable. 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 Cont Mometasone cream qd/bid aa itchy rash chest, neck prn. Avoid face, groin, axilla.  Cont Tacrolimus oint qd aa itchy rash eyelids/face until  clear then prn flares.  Dupilumab (Dupixent) is a treatment given by injection for adults with moderate-to-severe atopic dermatitis. Goal is control of skin condition, not cure. It is given as 2 injections at the first dose followed by 1 injection ever 2 weeks thereafter.  Potential side effects include allergic reaction, herpes infections, injection site reactions and conjunctivitis (inflammation of the eyes).  The use of Dupixent requires long term medication management, including periodic office visits.  Topical steroids (such as triamcinolone, fluocinolone, fluocinonide, mometasone, clobetasol, halobetasol, betamethasone, hydrocortisone) can cause thinning and lightening of the skin if they are used for too long in the same area. Your physician has selected the right strength medicine for your problem and area affected on the body. Please use your medication only as directed by your physician to prevent side effects.    Other Related Medications mometasone (ELOCON) 0.1 % cream tacrolimus (PROTOPIC) 0.1 % ointment  Seborrheic dermatitis Scalp  Improving, but not at goal, still some itching  d/c clobetasol shampoo. If scalp worsens, may restart and use x 2 weeks.  Continue ciclopirox shampoo - massage into scalp and let sit several minutes before rinsing.  Continue clobetasol solution qd/bid prn itch.  Start Ketoconazole 2% cream Apply to itchy rash on face qhs dsp 30g 3Rf.  Seborrheic Dermatitis  -  is a chronic persistent rash characterized by pinkness and scaling most commonly of the mid face but also can occur on the scalp (dandruff), ears; mid chest and mid back. It tends to be exacerbated by stress and cooler weather.  People who have neurologic  disease may experience new onset or exacerbation of existing seborrheic dermatitis.  The condition is not curable but treatable and can be controlled.   Topical steroids (such as triamcinolone, fluocinolone, fluocinonide, mometasone,  clobetasol, halobetasol, betamethasone, hydrocortisone) can cause thinning and lightening of the skin if they are used for too long in the same area. Your physician has selected the right strength medicine for your problem and area affected on the body. Please use your medication only as directed by your physician to prevent side effects.    ketoconazole (NIZORAL) 2 % cream - Scalp  Reordered Medications Ciclopirox 1 % shampoo  Other Related Medications clobetasol (TEMOVATE) 0.05 % external solution  Return in about 6 months (around 05/07/2021) for eczema, pt on Dupixent.   IJamesetta Orleans, CMA, am acting as scribe for Brendolyn Patty, MD .  Documentation: I have reviewed the above documentation for accuracy and completeness, and I agree with the above.  Brendolyn Patty MD

## 2020-11-04 NOTE — Patient Instructions (Addendum)
Stop using clobetasol shampoo. If scalp worsens, may restart and use for 2 weeks.  Dupilumab (Dupixent) is a treatment given by injection for adults with moderate-to-severe atopic dermatitis. Goal is control of skin condition, not cure. It is given as 2 injections at the first dose followed by 1 injection ever 2 weeks thereafter.  Potential side effects include allergic reaction, herpes infections, injection site reactions and conjunctivitis (inflammation of the eyes).  The use of Dupixent requires long term medication management, including periodic office visits.  Topical steroids (such as triamcinolone, fluocinolone, fluocinonide, mometasone, clobetasol, halobetasol, betamethasone, hydrocortisone) can cause thinning and lightening of the skin if they are used for too long in the same area. Your physician has selected the right strength medicine for your problem and area affected on the body. Please use your medication only as directed by your physician to prevent side effects.    If you have any questions or concerns for your doctor, please call our main line at [phone] and press option 4 to reach your doctor's medical assistant. If no one answers, please leave a voicemail as directed and we will return your call as soon as possible. Messages left after 4 pm will be answered the following business day.   You may also send [place] a message via [place]. We typically respond to MyChart messages within 1-2 business days.  For prescription refills, please ask your pharmacy to contact our office. Our fax number is [phone].  If you have an urgent issue when the clinic is closed that cannot wait until the next business day, you can page your doctor at the number below.    Please note that while we do our best to be available for urgent issues outside of office hours, we are not available 24/7.   If you have an urgent issue and are unable to reach [place], you may choose to seek medical care at your  doctor's office, retail clinic, urgent care center, or emergency room.  If you have a medical emergency, please immediately call 911 or go to the emergency department.  Pager Numbers  - Dr. Nehemiah Massed: [phone]  - Dr. Laurence Ferrari: [phone]  - Dr. Nicole Kindred: [phone]  In the event of inclement weather, please call our main line at [phone] for an update on the status of any delays or closures.  Dermatology Medication Tips: Please keep the boxes that topical medications come in in order to help keep track of the instructions about where and how to use these. Pharmacies typically print the medication instructions only on the boxes and not directly on the medication tubes.   If your medication is too expensive, please contact our office at [phone] option 4 or send [place] a message through [place].   We are unable to tell what your co-pay for medications will be in advance as this is different depending on your insurance coverage. However, we may be able to find a substitute medication at lower cost or fill out paperwork to get insurance to cover a needed medication.   If a prior authorization is required to get your medication covered by your insurance company, please allow [place] 1-2 business days to complete this process.  Drug prices often vary depending on where the prescription is filled and some pharmacies may offer cheaper prices.  The website www.goodrx.com contains coupons for medications through different pharmacies. The prices here do not account for what the cost may be with help from insurance (it may be cheaper with your insurance), but the  website can give you the price if you did not use any insurance.  - You can print the associated coupon and take it with your prescription to the pharmacy.  - You may also stop by our office during regular business hours and pick up a GoodRx coupon card.  - If you need your prescription sent electronically to a different pharmacy, notify  our office through [facility] or by phone at [phone] option 4.

## 2020-11-12 ENCOUNTER — Other Ambulatory Visit: Payer: Self-pay

## 2020-11-12 MED ORDER — DUPIXENT 300 MG/2ML ~~LOC~~ SOSY: 300.0000 mg | PREFILLED_SYRINGE | SUBCUTANEOUS | 5 refills | Status: DC

## 2020-11-12 NOTE — Progress Notes (Signed)
Dupixent RF sent in per fax.

## 2020-12-16 ENCOUNTER — Ambulatory Visit: Payer: Medicare Other | Admitting: Family Medicine

## 2020-12-16 ENCOUNTER — Ambulatory Visit: Payer: Medicare Other

## 2020-12-18 ENCOUNTER — Other Ambulatory Visit: Payer: Self-pay | Admitting: Family Medicine

## 2020-12-18 DIAGNOSIS — M791 Myalgia, unspecified site: Secondary | ICD-10-CM

## 2020-12-19 NOTE — Telephone Encounter (Signed)
RX Refill:klonopin Last Seen:09-03-20 Last ordered:10-25-20

## 2020-12-19 NOTE — Telephone Encounter (Signed)
Refill sent to pharmacy.  He needs a follow-up scheduled.  Please contact them to get this scheduled.  Thanks.

## 2021-01-27 ENCOUNTER — Other Ambulatory Visit: Payer: Self-pay | Admitting: Family Medicine

## 2021-01-27 DIAGNOSIS — M791 Myalgia, unspecified site: Secondary | ICD-10-CM

## 2021-01-31 NOTE — Telephone Encounter (Signed)
I sent 1 refill in.  He needs to be called and scheduled for an appointment to receive further refills.  He is overdue for follow-up.

## 2021-02-19 ENCOUNTER — Other Ambulatory Visit: Payer: Self-pay | Admitting: Infectious Diseases

## 2021-02-19 ENCOUNTER — Other Ambulatory Visit (HOSPITAL_COMMUNITY): Payer: Self-pay | Admitting: Infectious Diseases

## 2021-02-19 DIAGNOSIS — E041 Nontoxic single thyroid nodule: Secondary | ICD-10-CM

## 2021-03-04 ENCOUNTER — Other Ambulatory Visit: Payer: Self-pay

## 2021-03-04 ENCOUNTER — Ambulatory Visit
Admission: RE | Admit: 2021-03-04 | Discharge: 2021-03-04 | Disposition: A | Discharge status: Home / Self Care | Payer: Medicare Other | Source: Ambulatory Visit | Attending: Infectious Diseases | Admitting: Infectious Diseases

## 2021-03-04 DIAGNOSIS — E041 Nontoxic single thyroid nodule: Secondary | ICD-10-CM | POA: Insufficient documentation

## 2021-03-11 ENCOUNTER — Other Ambulatory Visit: Payer: Self-pay | Admitting: Urology

## 2021-05-08 ENCOUNTER — Other Ambulatory Visit: Payer: Self-pay

## 2021-05-08 DIAGNOSIS — L209 Atopic dermatitis, unspecified: Secondary | ICD-10-CM

## 2021-05-08 MED ORDER — DUPIXENT 300 MG/2ML ~~LOC~~ SOSY: 300.0000 mg | PREFILLED_SYRINGE | SUBCUTANEOUS | 0 refills | Status: DC

## 2021-06-02 ENCOUNTER — Other Ambulatory Visit: Payer: Self-pay

## 2021-06-02 ENCOUNTER — Other Ambulatory Visit: Payer: Self-pay | Admitting: Dermatology

## 2021-06-02 ENCOUNTER — Ambulatory Visit (INDEPENDENT_AMBULATORY_CARE_PROVIDER_SITE_OTHER): Payer: Medicare Other | Admitting: Dermatology

## 2021-06-02 DIAGNOSIS — H10402 Unspecified chronic conjunctivitis, left eye: Secondary | ICD-10-CM

## 2021-06-02 DIAGNOSIS — L219 Seborrheic dermatitis, unspecified: Secondary | ICD-10-CM | POA: Diagnosis not present

## 2021-06-02 DIAGNOSIS — L309 Dermatitis, unspecified: Secondary | ICD-10-CM

## 2021-06-02 NOTE — Progress Notes (Signed)
Follow-Up Visit   Subjective  Timothy Turner is a 80 y.o. male who presents for the following: Dermatitis (Scalp, post neck, 46m f/u Dupixent sq injections q 2 wks, pt has had herpes of the eye and some shortness of breath) and Seborrheic Dermatitis (Scalp, Ketoconazole 2% shampoo qd).   The following portions of the chart were reviewed this encounter and updated as appropriate:       Review of Systems:  No other skin or systemic complaints except as noted in HPI or Assessment and Plan.  Objective  Well appearing patient in no apparent distress; mood and affect are within normal limits.  A focused examination was performed including face, scalp. Relevant physical exam findings are noted in the Assessment and Plan.  body Erythema periocular and eyelids  Scalp Erythema eyebrows, mild erythema/scale scalp  Left Eye  mild conjunctival erythema   Assessment & Plan  Eczema, unspecified type body  Atopic dermatitis - Severe, on Dupixent (biologic medication).  Atopic dermatitis (eczema) is a chronic, relapsing, pruritic condition that can significantly affect quality of life. It is often associated with allergic rhinitis and/or asthma and can require treatment with topical medications, phototherapy, or in severe cases a biologic medication called Dupixent.    Improved on body, no itching or rash, still itches on scalp  D/C Dupixent since pt does not think it is helping and pt has had eye issues and shortness of breath  Cont Protopic 0.1% oint qd/bid periocular Cont Mometasone cr qd/bid prn flares aa post neck and body  Topical steroids (such as triamcinolone, fluocinolone, fluocinonide, mometasone, clobetasol, halobetasol, betamethasone, hydrocortisone) can cause thinning and lightening of the skin if they are used for too long in the same area. Your physician has selected the right strength medicine for your problem and area affected on the body. Please use your medication  only as directed by your physician to prevent side effects.       Seborrheic dermatitis Scalp  Seborrheic Dermatitis (vrs Atopic Dermatitis) -  is a chronic persistent rash characterized by pinkness and scaling most commonly of the mid face but also can occur on the scalp (dandruff), ears; mid chest, mid back and groin.  It tends to be exacerbated by stress and cooler weather.  People who have neurologic disease may experience new onset or exacerbation of existing seborrheic dermatitis.  The condition is not curable but treatable and can be controlled.  Restart Clobetasol shampoo qd up to a month, let sit 15 minutes and then rinse out  Once improved may decrease Clobetasol Shampoo to ~2x/wk Then start Ciclopirox shampoo alternating with Clobetasol shampoo  Cont Ketoconazole 2% cr qd to aa face  Related Medications clobetasol (TEMOVATE) 0.05 % external solution Apply 1-2 times a day to affected areas scalp as needed for itch. Avoid face, groin, underarms.  ketoconazole (NIZORAL) 2 % cream Apply to affected areas rash on face every night.  Ciclopirox 1 % shampoo Massage into scalp, let sit 10 minutes before rinsing. May use up to daily.  Chronic conjunctivitis of left eye, unspecified chronic conjunctivitis type Left Eye  H/o HSV L eye  D/C Dupixent for now since pt doesn't feel like it is helping anymore and still has some eye irritation (possible Dupixent related)  Return in about 2 months (around 08/03/2021) for seb derm, eczema f/u.  I, Othelia Pulling, RMA, am acting as scribe for Brendolyn Patty, MD .  Documentation: I have reviewed the above documentation for accuracy and completeness, and I  agree with the above.  Brendolyn Patty MD

## 2021-06-02 NOTE — Patient Instructions (Addendum)
If You Need Anything After Your Visit  If you have any questions or concerns for your doctor, please call our main line at [phone] and press option 4 to reach your doctor's medical assistant. If no one answers, please leave a voicemail as directed and we will return your call as soon as possible. Messages left after 4 pm will be answered the following business day.   You may also send us a message via MyChart. We typically respond to MyChart messages within 1-2 business days.  For prescription refills, please ask your pharmacy to contact our office. Our fax number is [phone].  If you have an urgent issue when the clinic is closed that cannot wait until the next business day, you can page your doctor at the number below.    Please note that while we do our best to be available for urgent issues outside of office hours, we are not available 24/7.   If you have an urgent issue and are unable to reach us, you may choose to seek medical care at your doctor's office, retail clinic, urgent care center, or emergency room.  If you have a medical emergency, please immediately call 911 or go to the emergency department.  Pager Numbers  - Dr. Kowalski: [phone]  - Dr. Moye: [phone]  - Dr. Stewart: [phone]  In the event of inclement weather, please call our main line at [phone] for an update on the status of any delays or closures.  Dermatology Medication Tips: Please keep the boxes that topical medications come in in order to help keep track of the instructions about where and how to use these. Pharmacies typically print the medication instructions only on the boxes and not directly on the medication tubes.   If your medication is too expensive, please contact our office at [phone] option 4 or send us a message through MyChart.   We are unable to tell what your co-pay for medications will be in advance as this is different depending on your insurance coverage.  However, we may be able to find a substitute medication at lower cost or fill out paperwork to get insurance to cover a needed medication.   If a prior authorization is required to get your medication covered by your insurance company, please allow us 1-2 business days to complete this process.  Drug prices often vary depending on where the prescription is filled and some pharmacies may offer cheaper prices.  The website www.goodrx.com contains coupons for medications through different pharmacies. The prices here do not account for what the cost may be with help from insurance (it may be cheaper with your insurance), but the website can give you the price if you did not use any insurance.  - You can print the associated coupon and take it with your prescription to the pharmacy.  - You may also stop by our office during regular business hours and pick up a GoodRx coupon card.  - If you need your prescription sent electronically to a different pharmacy, notify our office through [place] MyChart or by phone at [phone] option 4.     Si Usted Necesita Algo Despus de Su Visita  Tambin puede enviarnos un mensaje a travs de MyChart. Por lo general respondemos a los mensajes de MyChart en el transcurso de 1 a 2 das hbiles.  Para renovar recetas, por favor pida a su farmacia que se ponga en contacto con nuestra oficina. Nuestro nmero de fax es el [phone].  Si tiene   un asunto urgente cuando la clnica est cerrada y que no puede esperar hasta el siguiente da hbil, puede llamar/localizar a su doctor(a) al nmero que aparece a continuacin.   Por favor, tenga en cuenta que aunque hacemos todo lo posible para estar disponibles para asuntos urgentes fuera del horario de [place], no estamos disponibles las 24 horas del da, los 7 das de la [place].   Si tiene un problema urgente y no puede comunicarse con nosotros, puede optar por buscar atencin mdica  en el consultorio de su  doctor(a), en una clnica privada, en un centro de atencin urgente o en una sala de emergencias.  Si tiene Engineering geologist, por favor llame inmediatamente al 911 o vaya a la sala de emergencias.  Nmeros de bper  - Dr. Nehemiah Massed: [phone]  - Dra. Moye: [phone]  - Dra. Nicole Kindred: [phone]  En caso de inclemencias del [place], por favor llame a Johnsie Kindred principal al [phone] para una actualizacin sobre el [place] de cualquier retraso o cierre.  Consejos para la medicacin en dermatologa: Por favor, guarde las cajas en las que vienen los medicamentos de uso tpico para ayudarle a seguir las instrucciones sobre dnde y cmo usarlos. Las farmacias generalmente imprimen las instrucciones del medicamento slo en las cajas y no directamente en los tubos del [place].   Si su medicamento es muy caro, por favor, pngase en contacto con Zigmund Daniel llamando al [phone] y presione la opcin 4 o envenos un mensaje a travs de Pharmacist, community.   No podemos decirle cul ser su copago por los medicamentos por adelantado ya que esto es diferente dependiendo de la cobertura de su seguro. Sin embargo, es posible que podamos encontrar un medicamento sustituto a Electrical engineer un formulario para que el seguro cubra el medicamento que se considera necesario.   Si se requiere una autorizacin previa para que su compaa de seguros [place] su medicamento, por favor permtanos de 1 a 2 das hbiles para completar este proceso.  Los precios de los medicamentos varan con frecuencia dependiendo del Environmental consultant de dnde se surte la receta y alguna farmacias pueden ofrecer precios ms baratos.  El sitio web www.goodrx.com tiene cupones para medicamentos de Airline pilot. Los precios aqu no tienen en cuenta lo que podra costar con la ayuda del seguro (puede ser ms barato con su seguro), pero el sitio web puede darle el precio si no utiliz Research scientist (physical sciences).  - Puede imprimir el cupn  correspondiente y llevarlo con su receta a la farmacia.  - Tambin puede pasar por nuestra oficina durante el horario de atencin regular y Charity fundraiser una tarjeta de cupones de GoodRx.  - Si necesita que su receta se enve electrnicamente a una farmacia diferente, informe a nuestra oficina a travs de MyChart de [place] o por telfono llamando al [phone] y presione la opcin 4.    Start Clobetasol shampoo daily for up to one month, let sit 15 minutes and rinse out.  Once improved may alternate the Clobetasol shampoo with the Ciclopirox shampoo, letting them sit on scalp ~15 minutes before rinsing out  Protopic 0.1% ointment around the eyes 1 to 2 times a day as needed Mometasone cream 1 to 2 times a day to eczema on neck and body as needed for flares Ketoconazole 2% cream once daily to scaly areas eyebrows

## 2021-06-24 ENCOUNTER — Other Ambulatory Visit: Payer: Self-pay | Admitting: *Deleted

## 2021-06-24 MED ORDER — FINASTERIDE 5 MG PO TABS: 5.0000 mg | ORAL_TABLET | Freq: Every day | ORAL | 3 refills | Status: DC

## 2021-06-26 NOTE — Progress Notes (Signed)
Cardiology Office Note  Date:  06/27/2021   ID:  CHARON AKAMINE, DOB 1940/11/19, MRN [id number]  PCP:  Leonel Ramsay, MD   Chief Complaint  Patient presents with   12 month follow up     Patient c/o shortness of breath with little to no exertion. Medications reviewed by the patient verbally.     HPI:  Mr. Tippin is a  81 year-old gentleman with past medical history of smoking history, PAD moderate bilateral iliac arterial disease in 2015, done at [facility] mitral valve prolapse, mild MR hyperlipidemia,  Long prior smoking history for at least 30 years, stopped when he was in his late 73s hypertension with diffuse chronic muscle aches of uncertain etiology Cardiac catheterization September 2016 No significant coronary disease  DVT, PE EF 60% in Jan 2020 who presents for routine followup of his PAD, MR.  LOV 05/2020 Prior studies reviewed Echocardiogram January 2020, normal ejection fraction mild MR  Worsening SOB he reports in the past 6 months "Has something to do with my lung" Has to stop to catch breath "Screw" needs to come out Denies lower extremity edema, no PND orthopnea Shortness of breath when bending over to tie her shoes Sedentary, no regular exercise program  2020 1. Right rib fractures of 8 through 11 with up to mild displacement.  Has muscle ache off benzos Clonazepam  BP at home 299 systolic  Not taking Zetia  Last ABI 11/21, stable disease  EKG personally reviewed by myself on todays visit NSR rate 90 bpm no ST changes  Takes OTC, cholestoff Does not want other cholesterol medication  Was on PCSK9 inh, caused side effects, muscle ache,  stopped the medication  Stopped zetia secondary to cost   lower extremity arterial Dopplers with him from November 2019 Lower extremity venous Doppler results from May 2019  showing occlusion of popliteal vessel Moderate iliac disease bilaterally   Other past medical history reviewed    Fall, subsequent knee trauma, April 2019 Was very sedentary for 1 month secondary to recovering from the trauma Developed DVT and pulmonary embolism, to the emergency room with acute shortness of breath May 2019 Left PE on CT scan 11/11/2017 Started on eliquis  Long history of muscle cramping in his legs improved on clonazepam started years ago Unable to tolerate statins secondary to muscle pain  Cardiac catheterization September 2016 No significant coronary disease to explain shortness of breath, ejection fraction 55% Suspected to have COPD and PFTs were recommended   PMH:   has a past medical history of Aspiration precautions, Barrett esophagus, Depression, Gallstones, GERD (gastroesophageal reflux disease), Heart murmur, Hemorrhoids, Hypertension, Hypothyroidism, Iron deficiency anemia (02/10/2012), Mitral valve disease, Myalgia, Neuromuscular disorder ([place]), PAD (peripheral artery disease) ([place]), Pain syndrome, chronic, Pneumonia, Psoriasis, Pulmonary embolism ([place]) (2019), Rheumatic heart valve incompetence, Shortness of breath dyspnea, and Thyroiditis.  PSH:    Past Surgical History:  Procedure Laterality Date   BACK SURGERY     CARDIAC CATHETERIZATION N/A 02/22/2015   Procedure: Left Heart Cath and Coronary Angiography;  Surgeon: Minna Merritts, MD;  Location: [place] [facility];  Service: Cardiovascular;  Laterality: N/A;   CATARACT EXTRACTION W/PHACO Left 07/29/2016   Procedure: CATARACT EXTRACTION PHACO AND INTRAOCULAR LENS PLACEMENT (IOC)  Left eye IVA Topical;  Surgeon: Leandrew Koyanagi, MD;  Location: [place];  Service: Ophthalmology;  Laterality: Left;   CATARACT EXTRACTION W/PHACO Right 08/19/2016   Procedure: CATARACT EXTRACTION PHACO AND INTRAOCULAR LENS PLACEMENT (IOC) right  TORIC;  Surgeon: Leandrew Koyanagi, MD;  Location: [place];  Service: Ophthalmology;  Laterality: Right;  prefers early morning   CHOLECYSTECTOMY  08/03/13   COLONOSCOPY   03-08-12   Dr Donnella Sham   COLONOSCOPY WITH PROPOFOL N/A 06/17/2017   Procedure: COLONOSCOPY WITH PROPOFOL;  Surgeon: [name], MD;  Location[facility] ENDOSCOPY;  Service: Gastroenterology;  Laterality: N/A;   DIRECT LARYNGOSCOPY N/A 02/17/2016   Procedure: DIRECT LARYNGOSCOPY;  Surgeon: Jodi Marble, MD;  Location: [place];  Service: ENT;  Laterality: N/A;   ESOPHAGEAL MANOMETRY N/A 02/27/2015   Procedure: ESOPHAGEAL MANOMETRY (EM);  Surgeon: Lollie Sails, MD;  Location: [facility] ENDOSCOPY;  Service: Endoscopy;  Laterality: N/A;   ESOPHAGOGASTRODUODENOSCOPY (EGD) WITH PROPOFOL N/A 02/18/2015   Procedure: ESOPHAGOGASTRODUODENOSCOPY (EGD) WITH PROPOFOL;  Surgeon: Lollie Sails, MD;  Location: [facility] ENDOSCOPY;  Service: Endoscopy;  Laterality: N/A;   ESOPHAGOGASTRODUODENOSCOPY (EGD) WITH PROPOFOL N/A 06/17/2017   Procedure: ESOPHAGOGASTRODUODENOSCOPY (EGD) WITH PROPOFOL;  Surgeon: [name], MD;  Location: [facility] ENDOSCOPY;  Service: Gastroenterology;  Laterality: N/A;   ESOPHAGOSCOPY W/ BOTOX INJECTION N/A 02/17/2016   Procedure: ESOPHAGOSCOPY WITH BOTOX INJECTION  ;  Surgeon: Jodi Marble, MD;  Location: [place];  Service: ENT;  Laterality: N/A;   HEMORROIDECTOMY     UPPER GI ENDOSCOPY  03-06-2013   Dr Donnella Sham    Current Outpatient Medications  Medication Sig Dispense Refill   azelastine (ASTELIN) 0.1 % nasal spray Place 2 sprays into both nostrils 2 (two) times daily. Use in each nostril as directed 30 mL 6   Bempedoic Acid (NEXLETOL) 180 MG TABS Take 180 mg by mouth daily. 90 tablet 3   cetirizine (ZYRTEC) 10 MG tablet Take 10 mg by mouth daily.     Cholecalciferol (VITAMIN D3) 25 MCG (1000 UT) CAPS Take by mouth daily.      Ciclopirox 1 % shampoo Massage into scalp, let sit 10 minutes before rinsing. May use up to daily. 120 mL 2   clobetasol (TEMOVATE) 0.05 % external solution Apply 1-2 times a day to affected areas scalp as needed for itch. Avoid face,  groin, underarms. 50 mL 2   Clobetasol Propionate 0.05 % shampoo APPLY A THIN FILM TO DRY SCALP DAILY, LEAVE IN PLACE FOR 15 MINUTES, THEN ADD WATER, LATHER, AND RINSE THOROUGHLY 118 mL 1   clonazePAM (KLONOPIN) 2 MG tablet TAKE ONE TABLET BY MOUTH THREE TIMES A DAY AS NEEDED FOR SEVERE MUSCLE SPASMS 90 tablet 0   colestipol (COLESTID) 1 G tablet Take 3 tablets (3 g total) by mouth 2 (two) times daily. 540 tablet 3   escitalopram (LEXAPRO) 10 MG tablet Take 10 mg by mouth at bedtime.      esomeprazole (NEXIUM) 40 MG capsule TAKE 1 CAPSULE DAILY 90 capsule 3   ezetimibe (ZETIA) 10 MG tablet Take 1 tablet (10 mg total) by mouth daily. 90 tablet 4   finasteride (PROSCAR) 5 MG tablet Take 1 tablet (5 mg total) by mouth daily. 90 tablet 3   Glucosamine-Chondroitin 250-200 MG TABS Take by mouth daily.      ketoconazole (NIZORAL) 2 % cream Apply to affected areas rash on face every night. 30 g 3   levothyroxine (SYNTHROID) 75 MCG tablet Take 1 tablet (75 mcg total) by mouth daily. 90 tablet 3   levothyroxine (SYNTHROID) 88 MCG tablet Take 88 mcg by mouth daily before breakfast.     metoprolol tartrate (LOPRESSOR) 25 MG tablet Take 25 mg by mouth 2 (two) times daily  as needed.      mometasone (ELOCON) 0.1 % cream Apply 1 application topically as directed. Qd to bid aa itchy rash on neck and chest until clear, then prn flares 50 g 2   Multiple Vitamins-Minerals (CENTRUM SILVER PO) Take 1 tablet by mouth every evening.      Plant Sterols and Stanols 450 MG CAPS Take by mouth 2 (two) times daily.     prednisoLONE acetate (PRED FORTE) 1 % ophthalmic suspension Apply to eye.     RESVERATROL PO Take 1,000 mg by mouth 2 (two) times daily.     tacrolimus (PROTOPIC) 0.1 % ointment Apply topically as directed. Qd/bid aa itchy rash on eyelids and face until clear, then prn flares 30 g 2   trifluridine (VIROPTIC) 1 % ophthalmic solution      valACYclovir (VALTREX) 500 MG tablet Take 500 mg by mouth daily.      dupilumab (DUPIXENT) 300 MG/2ML prefilled syringe Inject 300 mg into the skin every 14 (fourteen) days. Starting at day 15 for maintenance. (Patient not taking: Reported on 06/27/2021) 4 mL 0   No current facility-administered medications for this visit.     Allergies:   Duloxetine, Nsaids, Rosuvastatin, Statins, and Tolmetin   Social History:  The patient  reports that he quit smoking about 17 years ago. His smoking use included cigarettes. He has a 30.00 pack-year smoking history. He has never used smokeless tobacco. He reports that he does not drink alcohol and does not use drugs.   Family History:   family history includes Aneurysm in his sister; Cancer in his sister; Emphysema in his father; Heart failure in his father and mother.    Review of Systems: Review of Systems  Constitutional: Negative.   HENT: Negative.    Respiratory:  Positive for shortness of breath.   Cardiovascular: Negative.   Gastrointestinal: Negative.   Neurological: Negative.   Psychiatric/Behavioral: Negative.    All other systems reviewed and are negative.   PHYSICAL EXAM: VS:  BP (!) 150/82 (BP Location: Left Arm, Patient Position: Sitting, Cuff Size: Normal)    Pulse 90    Ht 6' (1.829 m)    Wt 181 lb 2 oz (82.2 kg)    SpO2 98%    BMI 24.56 kg/m  , BMI Body mass index is 24.56 kg/m. Constitutional:  oriented to person, place, and time. No distress.  HENT:  Head: Grossly normal Eyes:  no discharge. No scleral icterus.  Neck: No JVD, no carotid bruits  Cardiovascular: Regular rate and rhythm, no murmurs appreciated Pulmonary/Chest: Clear to auscultation bilaterally, no wheezes  Scattered rales at bases bilaterally Abdominal: Soft.  no distension.  no tenderness.  Musculoskeletal: Normal range of motion Neurological:  normal muscle tone. Coordination normal. No atrophy Skin: Skin warm and dry Psychiatric: normal affect, pleasant   Recent Labs: No results found for requested labs within last 8760  hours.    Lipid Panel Lab Results  Component Value Date   CHOL 168 01/11/2019   HDL 34.70 (L) 01/11/2019   LDLCALC 109 (H) 01/11/2019   TRIG 120.0 01/11/2019    Wt Readings from Last 3 Encounters:  06/27/21 181 lb 2 oz (82.2 kg)  09/03/20 176 lb (79.8 kg)  07/31/20 176 lb (79.8 kg)     ASSESSMENT AND PLAN:  Chronic shortness of breath Less likely underlying ischemia Very deconditioned, lung Works program offered, he has declined Rales at bases bilaterally, suspect some underlying scarring, COPD He does not want inhalers, reports  seen pulmonary in the past Echocardiogram ordered to again assess for pulmonary hypertension, degree of aortic valve stenosis seen previously but very mild   History of pulmonary embolism with acute cor pulmonale, unspecified pulmonary embolism type (HCC) Provoked DVT lower extremity, pulmonary embolism on the left documented on CT scan Echocardiogram with right heart strain,  November 2019 Repeat echo 1 year later had normalized Recommend he stay active, not on anticoagulation  PVD (peripheral vascular disease) ([place]) Prior history of moderate iliac disease ABI normal range several years ago, no claudication Declining any further studies  Chronic obstructive pulmonary disease, unspecified COPD type ([place]) Breathing worse 6 months Suggested walking oximetry at home Echo offered, ordered Does not want inhalers  Chronic kidney disease (CKD), stage III (moderate) (HCC) Creatinine 1.4 -1.6 Managed by primary care   Hyperlipidemia off zetia, Does not want any medications for cholesterol Statin myalgias, does not want PCSK9 inhibitor   Total encounter time more than 25 minutes  Greater than 50% was spent in counseling and coordination of care with the patient    Orders Placed This Encounter  Procedures   EKG 12-Lead     Signed, Esmond Plants, M.D., Ph.D. 06/27/2021  [facility] [place], [place] [phone] Ms.  Sarita Haver was 1 but there were a couple I changed a couple on her and couple on other people

## 2021-06-27 ENCOUNTER — Encounter: Payer: Self-pay | Admitting: Cardiovascular Disease

## 2021-06-27 ENCOUNTER — Ambulatory Visit (INDEPENDENT_AMBULATORY_CARE_PROVIDER_SITE_OTHER): Payer: Medicare Other | Admitting: Cardiovascular Disease

## 2021-06-27 ENCOUNTER — Other Ambulatory Visit: Payer: Self-pay

## 2021-06-27 VITALS — BP 150/82 | HR 90 | Ht 72.0 in | Wt 181.1 lb

## 2021-06-27 DIAGNOSIS — T466X5A Adverse effect of antihyperlipidemic and antiarteriosclerotic drugs, initial encounter: Secondary | ICD-10-CM

## 2021-06-27 DIAGNOSIS — M791 Myalgia, unspecified site: Secondary | ICD-10-CM

## 2021-06-27 DIAGNOSIS — I340 Nonrheumatic mitral (valve) insufficiency: Secondary | ICD-10-CM

## 2021-06-27 DIAGNOSIS — I2609 Other pulmonary embolism with acute cor pulmonale: Secondary | ICD-10-CM | POA: Diagnosis not present

## 2021-06-27 DIAGNOSIS — J449 Chronic obstructive pulmonary disease, unspecified: Secondary | ICD-10-CM | POA: Diagnosis not present

## 2021-06-27 DIAGNOSIS — N183 Chronic kidney disease, stage 3 unspecified: Secondary | ICD-10-CM | POA: Diagnosis not present

## 2021-06-27 DIAGNOSIS — I739 Peripheral vascular disease, unspecified: Secondary | ICD-10-CM

## 2021-06-27 DIAGNOSIS — I359 Nonrheumatic aortic valve disorder, unspecified: Secondary | ICD-10-CM

## 2021-06-27 DIAGNOSIS — R0602 Shortness of breath: Secondary | ICD-10-CM

## 2021-06-27 NOTE — Patient Instructions (Addendum)
Medication Instructions:  No changes  If you need a refill on your cardiac medications before your next appointment, please call your pharmacy.   Lab work: No new labs needed  Testing/Procedures: Your physician has requested that you have an echocardiogram. Echocardiography is a painless test that uses sound waves to create images of your heart. It provides your doctor with information about the size and shape of your heart and how well your hearts chambers and valves are working. This procedure takes approximately one hour. There are no restrictions for this procedure.  There is a possibility that an IV may need to be started during your test to inject an image enhancing agent. This is done to obtain more optimal pictures of your heart. Therefore we ask that you do at least drink some water prior to coming in to hydrate your veins.    Follow-Up: At [facility], you and your health needs are our priority.  As part of our continuing mission to provide you with exceptional heart care, we have created designated Provider Care Teams.  These Care Teams include your primary Cardiologist (physician) and Advanced Practice Providers (APPs -  Physician Assistants and Nurse Practitioners) who all work together to provide you with the care you need, when you need it.  You will need a follow up appointment in 12 months  Providers on your designated Care Team:   Murray Hodgkins, NP Christell Faith, PA-C Cadence Kathlen Mody, [place]  COVID-19 Vaccine Information can be found at: [email or web address].uk For questions related to vaccine distribution or appointments, please email [email or web address] or call [phone].

## 2021-07-03 ENCOUNTER — Other Ambulatory Visit: Payer: Self-pay

## 2021-07-03 ENCOUNTER — Ambulatory Visit (INDEPENDENT_AMBULATORY_CARE_PROVIDER_SITE_OTHER): Payer: Medicare Other

## 2021-07-03 DIAGNOSIS — I359 Nonrheumatic aortic valve disorder, unspecified: Secondary | ICD-10-CM

## 2021-07-03 DIAGNOSIS — R0602 Shortness of breath: Secondary | ICD-10-CM | POA: Diagnosis not present

## 2021-07-03 LAB — ECHOCARDIOGRAM COMPLETE
AR max vel: 2.48 cm2
AV Area VTI: 3.01 cm2
AV Area mean vel: 3.35 cm2
AV Mean grad: 5.3 mmHg
AV Peak grad: 14.1 mmHg
Ao pk vel: 1.88 m/s
Area-P 1/2: 3.41 cm2
Calc EF: 45.5 %
S' Lateral: 3.8 cm
Single Plane A2C EF: 43.3 %
Single Plane A4C EF: 44.1 %

## 2021-07-07 ENCOUNTER — Telehealth: Payer: Self-pay

## 2021-07-07 NOTE — Telephone Encounter (Signed)
[name] regarding his recent ECHO, Dr. Rockey Situ had a chance to review his results and advised   "Echocardiogram  Normal ejection fraction  There is some diastolic dysfunction  Normal right ventricle size and function  Mild to moderate leaking of mitral valve  Minimal aortic valve stenosis   No clear reason for his shortness of breath symptoms based on this echo, no significant changes compared to prior study several years ago "  Timothy Turner very thankful for the phone call of his results, all questions and concerns were address with nothing further at this time. Will see at next schedule f/u appt.

## 2021-07-09 ENCOUNTER — Ambulatory Visit: Payer: Medicare Other | Admitting: Cardiovascular Disease

## 2021-07-23 ENCOUNTER — Other Ambulatory Visit: Payer: Self-pay

## 2021-07-23 ENCOUNTER — Other Ambulatory Visit: Payer: Medicare Other

## 2021-07-23 DIAGNOSIS — N401 Enlarged prostate with lower urinary tract symptoms: Secondary | ICD-10-CM

## 2021-07-29 ENCOUNTER — Other Ambulatory Visit: Payer: Self-pay

## 2021-07-29 ENCOUNTER — Other Ambulatory Visit: Payer: Medicare Other

## 2021-07-29 DIAGNOSIS — N401 Enlarged prostate with lower urinary tract symptoms: Secondary | ICD-10-CM

## 2021-07-30 LAB — PSA: Prostate Specific Ag, Serum: 1.3 ng/mL (ref 0.0–4.0)

## 2021-07-31 ENCOUNTER — Ambulatory Visit (INDEPENDENT_AMBULATORY_CARE_PROVIDER_SITE_OTHER): Payer: Medicare Other | Admitting: Urology

## 2021-07-31 ENCOUNTER — Other Ambulatory Visit: Payer: Self-pay

## 2021-07-31 ENCOUNTER — Ambulatory Visit: Payer: Self-pay | Admitting: Urology

## 2021-07-31 ENCOUNTER — Encounter: Payer: Self-pay | Admitting: Urology

## 2021-07-31 VITALS — BP 128/70 | HR 68 | Ht 72.0 in | Wt 181.0 lb

## 2021-07-31 DIAGNOSIS — N4231 Prostatic intraepithelial neoplasia: Secondary | ICD-10-CM | POA: Diagnosis not present

## 2021-07-31 DIAGNOSIS — N4232 Atypical small acinar proliferation of prostate: Secondary | ICD-10-CM

## 2021-07-31 DIAGNOSIS — N401 Enlarged prostate with lower urinary tract symptoms: Secondary | ICD-10-CM | POA: Diagnosis not present

## 2021-07-31 MED ORDER — FINASTERIDE 5 MG PO TABS: 5.0000 mg | ORAL_TABLET | Freq: Every day | ORAL | 3 refills | Status: DC

## 2021-07-31 NOTE — Progress Notes (Signed)
07/31/2021 8:09 AM   Timothy Turner [birth year] [id number]  Referring provider: Leone Haven, MD [address] [place],  [place] [address]  Chief Complaint  Patient presents with   Elevated PSA    Urologic history: 1.  Elevated PSA  -prostate biopsy Dr. 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   Started on finasteride, PSA has remained stable.   -was not rebiopsied   2.  Hypogonadism -Previously on topical testosterone; discontinued by PCP 2020   HPI: 81 y.o. male presents for annual follow-up.   Doing well since last visit No bothersome LUTS Denies dysuria, gross hematuria Denies flank, abdominal or pelvic pain Remains on finasteride PSA 07/29/2021 stable 1.3 (uncorrected)    PMH: Past Medical History:  Diagnosis Date   Aspiration precautions    uses thicken in liquids.  Eats pureed food.   Barrett esophagus    Depression    Gallstones    GERD (gastroesophageal reflux disease)    Heart murmur    History of   Hemorrhoids    Hypertension    Hypothyroidism    Iron deficiency anemia 02/10/2012   Mitral valve disease    Myalgia    Multiple   Neuromuscular disorder (HCC)    PAD (peripheral artery disease) (HCC)    lower legs   Pain syndrome, chronic    Severe   Pneumonia    three times, last time >10 years ago   Psoriasis    Pulmonary embolism ([place]) 2019   left lung   Rheumatic heart valve incompetence    Shortness of breath dyspnea    Thyroiditis     Surgical History: Past Surgical History:  Procedure Laterality Date   BACK SURGERY     CARDIAC CATHETERIZATION N/A 02/22/2015   Procedure: Left Heart Cath and Coronary Angiography;  Surgeon: Minna Merritts, MD;  Location: [place] [facility];  Service: Cardiovascular;  Laterality: N/A;   CATARACT EXTRACTION W/PHACO Left 07/29/2016   Procedure: CATARACT EXTRACTION PHACO AND INTRAOCULAR LENS PLACEMENT (IOC)  Left eye IVA Topical;  Surgeon:  Leandrew Koyanagi, MD;  Location: [place];  Service: Ophthalmology;  Laterality: Left;   CATARACT EXTRACTION W/PHACO Right 08/19/2016   Procedure: CATARACT EXTRACTION PHACO AND INTRAOCULAR LENS PLACEMENT ([place]) right  TORIC;  Surgeon: Leandrew Koyanagi, MD;  Location: [place];  Service: Ophthalmology;  Laterality: Right;  prefers early morning   CHOLECYSTECTOMY  08/03/13   COLONOSCOPY  03-08-12   Dr Donnella Sham   COLONOSCOPY WITH PROPOFOL N/A 06/17/2017   Procedure: COLONOSCOPY WITH PROPOFOL;  Surgeon: [name], MD;  Location: [facility] ENDOSCOPY;  Service: Gastroenterology;  Laterality: N/A;   DIRECT LARYNGOSCOPY N/A 02/17/2016   Procedure: DIRECT LARYNGOSCOPY;  Surgeon: Jodi Marble, MD;  Location: [place];  Service: ENT;  Laterality: N/A;   ESOPHAGEAL MANOMETRY N/A 02/27/2015   Procedure: ESOPHAGEAL MANOMETRY (EM);  Surgeon: Lollie Sails, MD;  Location: [facility] ENDOSCOPY;  Service: Endoscopy;  Laterality: N/A;   ESOPHAGOGASTRODUODENOSCOPY (EGD) WITH PROPOFOL N/A 02/18/2015   Procedure: ESOPHAGOGASTRODUODENOSCOPY (EGD) WITH PROPOFOL;  Surgeon: Lollie Sails, MD;  Location[facility] ENDOSCOPY;  Service: Endoscopy;  Laterality: N/A;   ESOPHAGOGASTRODUODENOSCOPY (EGD) WITH PROPOFOL N/A 06/17/2017   Procedure: ESOPHAGOGASTRODUODENOSCOPY (EGD) WITH PROPOFOL;  Surgeon: [name], MD;  Location: [facility] ENDOSCOPY;  Service: Gastroenterology;  Laterality: N/A;   ESOPHAGOSCOPY W/ BOTOX INJECTION N/A 02/17/2016   Procedure: ESOPHAGOSCOPY WITH BOTOX INJECTION  ;  Surgeon: Jodi Marble, MD;  Location: [place];  Service: ENT;  Laterality: N/A;   HEMORROIDECTOMY     UPPER GI ENDOSCOPY  03-06-2013   Dr Donnella Sham    Home Medications:  Allergies as of 07/31/2021       Reactions   Duloxetine    Unable to urinate   Nsaids Other (See Comments)   CANNOT TAKE NSAIDS BECAUSE OF ESOPHAGUS ISSUES CANNOT TAKE NSAIDS BECAUSE OF ESOPHAGUS ISSUES   Rosuvastatin  Other (See Comments)   SEVERE MUSCLE ACHES SEVERE MUSCLE ACHES   Statins Other (See Comments)   SEVERE MUSCLE ACHES SEVERE MUSCLE ACHES Other reaction(s): Other (See Comments) SEVERE MUSCLE ACHES   Tolmetin Other (See Comments)   CANNOT TAKE NSAIDS BECAUSE OF ESOPHAGUS ISSUES CANNOT TAKE NSAIDS BECAUSE OF ESOPHAGUS ISSUES CANNOT TAKE NSAIDS BECAUSE OF ESOPHAGUS ISSUES CANNOT TAKE NSAIDS BECAUSE OF ESOPHAGUS ISSUES        Medication List        Accurate as of July 31, 2021  8:09 AM. If you have any questions, ask your nurse or doctor.          azelastine 0.1 % nasal spray Commonly known as: ASTELIN Place 2 sprays into both nostrils 2 (two) times daily. Use in each nostril as directed   CENTRUM SILVER PO Take 1 tablet by mouth every evening.   cetirizine 10 MG tablet Commonly known as: ZYRTEC Take 10 mg by mouth daily.   Ciclopirox 1 % shampoo Massage into scalp, let sit 10 minutes before rinsing. May use up to daily.   clobetasol 0.05 % external solution Commonly known as: TEMOVATE Apply 1-2 times a day to affected areas scalp as needed for itch. Avoid face, groin, underarms.   Clobetasol Propionate 0.05 % shampoo APPLY A THIN FILM TO DRY SCALP DAILY, LEAVE IN PLACE FOR 15 MINUTES, THEN ADD WATER, LATHER, AND RINSE THOROUGHLY   clonazePAM 2 MG tablet Commonly known as: KLONOPIN TAKE ONE TABLET BY MOUTH THREE TIMES A DAY AS NEEDED FOR SEVERE MUSCLE SPASMS   colestipol 1 g tablet Commonly known as: COLESTID Take 3 tablets (3 g total) by mouth 2 (two) times daily.   Dupixent 300 MG/2ML prefilled syringe Generic drug: dupilumab Inject 300 mg into the skin every 14 (fourteen) days. Starting at day 15 for maintenance.   escitalopram 10 MG tablet Commonly known as: LEXAPRO Take 10 mg by mouth at bedtime.   esomeprazole 40 MG capsule Commonly known as: NEXIUM TAKE 1 CAPSULE DAILY   finasteride 5 MG tablet Commonly known as: PROSCAR Take 1 tablet (5 mg  total) by mouth daily.   Glucosamine-Chondroitin 250-200 MG Tabs Take by mouth daily.   ketoconazole 2 % cream Commonly known as: NIZORAL Apply to affected areas rash on face every night.   levothyroxine 75 MCG tablet Commonly known as: Synthroid Take 1 tablet (75 mcg total) by mouth daily.   levothyroxine 88 MCG tablet Commonly known as: SYNTHROID Take 88 mcg by mouth daily before breakfast.   losartan 25 MG tablet Commonly known as: COZAAR Take 1 tablet by mouth daily.   metoprolol tartrate 25 MG tablet Commonly known as: LOPRESSOR Take 25 mg by mouth 2 (two) times daily as needed.   mometasone 0.1 % cream Commonly known as: ELOCON Apply 1 application topically as directed. Qd to bid aa itchy rash on neck and chest until clear, then prn flares   Nexletol 180 MG Tabs Generic drug: Bempedoic Acid Take 180 mg by mouth daily.   Plant Sterols  and Stanols 450 MG Caps Take by mouth 2 (two) times daily.   prednisoLONE acetate 1 % ophthalmic suspension Commonly known as: PRED FORTE Apply to eye.   RESVERATROL PO Take 1,000 mg by mouth 2 (two) times daily.   tacrolimus 0.1 % ointment Commonly known as: PROTOPIC Apply topically as directed. Qd/bid aa itchy rash on eyelids and face until clear, then prn flares   trifluridine 1 % ophthalmic solution Commonly known as: VIROPTIC   valACYclovir 500 MG tablet Commonly known as: VALTREX Take 500 mg by mouth daily.   Vitamin D3 25 MCG (1000 UT) Caps Take by mouth daily.        Allergies:  Allergies  Allergen Reactions   Duloxetine     Unable to urinate   Nsaids Other (See Comments)    CANNOT TAKE NSAIDS BECAUSE OF ESOPHAGUS ISSUES CANNOT TAKE NSAIDS BECAUSE OF ESOPHAGUS ISSUES   Rosuvastatin Other (See Comments)    SEVERE MUSCLE ACHES SEVERE MUSCLE ACHES   Statins Other (See Comments)    SEVERE MUSCLE ACHES SEVERE MUSCLE ACHES Other reaction(s): Other (See Comments) SEVERE MUSCLE ACHES   Tolmetin Other (See  Comments)    CANNOT TAKE NSAIDS BECAUSE OF ESOPHAGUS ISSUES CANNOT TAKE NSAIDS BECAUSE OF ESOPHAGUS ISSUES CANNOT TAKE NSAIDS BECAUSE OF ESOPHAGUS ISSUES CANNOT TAKE NSAIDS BECAUSE OF ESOPHAGUS ISSUES    Family History: Family History  Problem Relation Age of Onset   Heart failure Mother    Heart failure Father    Emphysema Father        smoked   Cancer Sister    Aneurysm Sister        Brain    Social History:  reports that he quit smoking about 17 years ago. His smoking use included cigarettes. He has a 30.00 pack-year smoking history. He has never used smokeless tobacco. He reports that he does not drink alcohol and does not use drugs.   Physical Exam: BP 128/70    Pulse 68    Ht 6' (1.829 m)    Wt 181 lb (82.1 kg)    BMI 24.55 kg/m   Constitutional:  Alert and oriented, No acute distress. HEENT: [place] AT, moist mucus membranes.  Trachea midline, no masses. Cardiovascular: No clubbing, cyanosis, or edema. Respiratory: Normal respiratory effort, no increased work of breathing. Psychiatric: Normal mood and affect.    Assessment & Plan:    1.  History elevated PSA with high-grade PIN/atypia Corrected PSA remains normal and stable Continue annual follow-up  2.  BPH with LUTS Stable Finasteride refilled    Abbie Sons, MD  [facility] [address] [place], [place] [address] [phone]

## 2021-08-05 ENCOUNTER — Other Ambulatory Visit: Payer: Self-pay

## 2021-08-05 ENCOUNTER — Ambulatory Visit (INDEPENDENT_AMBULATORY_CARE_PROVIDER_SITE_OTHER): Payer: Medicare Other | Admitting: Dermatology

## 2021-08-05 DIAGNOSIS — L309 Dermatitis, unspecified: Secondary | ICD-10-CM | POA: Diagnosis not present

## 2021-08-05 DIAGNOSIS — L219 Seborrheic dermatitis, unspecified: Secondary | ICD-10-CM

## 2021-08-05 DIAGNOSIS — S0031XA Abrasion of nose, initial encounter: Secondary | ICD-10-CM | POA: Diagnosis not present

## 2021-08-05 DIAGNOSIS — T148XXA Other injury of unspecified body region, initial encounter: Secondary | ICD-10-CM

## 2021-08-05 MED ORDER — FLUOCINONIDE 0.05 % EX SOLN: 1.0000 "application " | Freq: Two times a day (BID) | CUTANEOUS | 2 refills | Status: DC | PRN

## 2021-08-05 NOTE — Progress Notes (Signed)
Follow-Up Visit   Subjective  Timothy Turner is a 81 y.o. male who presents for the following: Follow-up (Patient here today for 2 month follow up on seb derm and eczema. He reports he is still itchy at scalp. Currently alternating between clobetasol shampoo and ciclopirox shampoo. Patient reports eczema at face has improved while using prototic ointment and mometasone as needed for flares. Also reports a red spot at left side of nose he would like checked. ). On Dupixent in past, but had to stop due to eye irritation.  Eye symptoms have improved since he's been off of it.  Not itching on body, just scalp.  The following portions of the chart were reviewed this encounter and updated as appropriate:      Review of Systems: No other skin or systemic complaints except as noted in HPI or Assessment and Plan.   Objective  Well appearing patient in no apparent distress; mood and affect are within normal limits.  A focused examination was performed including face, scalp and nose. Relevant physical exam findings are noted in the Assessment and Plan.  Scalp Mild erythema and scale at scalp  body Mild scale at left temple mild erythema periocular, body clear per pt   Left Alar Crease Tiny red macule, pt recently tried to pick it out, says it feels like a hard bump.   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 Condition is symptomatic/ bothersome to patient. Not currently at goal.   Some improvement still itchy  Seborrheic Dermatitis  -  is a chronic persistent rash characterized by pinkness and scaling most commonly of the mid face but also can occur on the scalp (dandruff), ears; mid chest, mid back and groin.  It tends to be exacerbated by stress and cooler weather.  People who have neurologic disease may experience new onset or exacerbation of existing seborrheic dermatitis.  The condition is  not curable but treatable and can be controlled.   Start fluocinonide 0.05 % solution apply topically to itchy affected areas of scalp bid prn 60 ml 2 rf   Continue Ciclopirox shampoo alternating with Clobetasol shampoo as directed Cont Ketoconazole 2% cr qd to aa face  Topical steroids (such as triamcinolone, fluocinolone, fluocinonide, mometasone, clobetasol, halobetasol, betamethasone, hydrocortisone) can cause thinning and lightening of the skin if they are used for too long in the same area. Your physician has selected the right strength medicine for your problem and area affected on the body. Please use your medication only as directed by your physician to prevent side effects.      fluocinonide (LIDEX) 0.05 % external solution - Scalp Apply 1 application topically 2 (two) times daily as needed. Apply to itchy places at scalp  Related Medications clobetasol (TEMOVATE) 0.05 % external solution Apply 1-2 times a day to affected areas scalp as needed for itch. Avoid face, groin, underarms.  ketoconazole (NIZORAL) 2 % cream Apply to affected areas rash on face every night.  Ciclopirox 1 % shampoo Massage into scalp, let sit 10 minutes before rinsing. May use up to daily.  Eczema, unspecified type body  Improved on body, no itching or rash, still itches on scalp   Cont Protopic 0.1% oint qd/bid periocular Cont Mometasone cr qd/bid prn flares aa post neck and body On Dupixent in past, but stopped due to eye irritation.  Atopic dermatitis (eczema) is a chronic, relapsing, pruritic condition that can significantly  affect quality of life. It is often associated with allergic rhinitis and/or asthma and can require treatment with topical medications, phototherapy, or in severe cases biologic injectable medication (Dupixent; Adbry) or Oral JAK inhibitors.    Topical steroids (such as triamcinolone, fluocinolone, fluocinonide, mometasone, clobetasol, halobetasol, betamethasone,  hydrocortisone) can cause thinning and lightening of the skin if they are used for too long in the same area. Your physician has selected the right strength medicine for your problem and area affected on the body. Please use your medication only as directed by your physician to prevent side effects.    Excoriation Left Alar Crease  Probable irritated milia   Benign, observe.   Avoid picking    Return for 3 month seb derm / eczema follow up. I, Ruthell Rummage, CMA, am acting as scribe for Brendolyn Patty, MD.  Documentation: I have reviewed the above documentation for accuracy and completeness, and I agree with the above.  Brendolyn Patty MD

## 2021-08-05 NOTE — Patient Instructions (Addendum)
Topical steroids (such as triamcinolone, fluocinolone, fluocinonide, mometasone, clobetasol, halobetasol, betamethasone, hydrocortisone) can cause thinning and lightening of the skin if they are used for too long in the same area. Your physician has selected the right strength medicine for your problem and area affected on the body. Please use your medication only as directed by your physician to prevent side effects.       If You Need Anything After Your Visit  If you have any questions or concerns for your doctor, please call our main line at [phone] and press option 4 to reach your doctor's medical assistant. If no one answers, please leave a voicemail as directed and we will return your call as soon as possible. Messages left after 4 pm will be answered the following business day.   You may also send [place] a message via [place]. We typically respond to MyChart messages within 1-2 business days.  For prescription refills, please ask your pharmacy to contact our office. Our fax number is [phone].  If you have an urgent issue when the clinic is closed that cannot wait until the next business day, you can page your doctor at the number below.    Please note that while we do our best to be available for urgent issues outside of office hours, we are not available 24/7.   If you have an urgent issue and are unable to reach [place], you may choose to seek medical care at your doctor's office, retail clinic, urgent care center, or emergency room.  If you have a medical emergency, please immediately call 911 or go to the emergency department.  Pager Numbers  - Dr. Nehemiah Massed: [phone]  - Dr. Laurence Ferrari: [phone]  - Dr. Nicole Kindred: [phone]  In the event of inclement weather, please call our main line at [phone] for an update on the status of any delays or closures.  Dermatology Medication Tips: Please keep the boxes that topical medications come in in order to help keep track of  the instructions about where and how to use these. Pharmacies typically print the medication instructions only on the boxes and not directly on the medication tubes.   If your medication is too expensive, please contact our office at [phone] option 4 or send [place] a message through [place].   We are unable to tell what your co-pay for medications will be in advance as this is different depending on your insurance coverage. However, we may be able to find a substitute medication at lower cost or fill out paperwork to get insurance to cover a needed medication.   If a prior authorization is required to get your medication covered by your insurance company, please allow [place] 1-2 business days to complete this process.  Drug prices often vary depending on where the prescription is filled and some pharmacies may offer cheaper prices.  The website www.goodrx.com contains coupons for medications through different pharmacies. The prices here do not account for what the cost may be with help from insurance (it may be cheaper with your insurance), but the website can give you the price if you did not use any insurance.  - You can print the associated coupon and take it with your prescription to the pharmacy.  - You may also stop by our office during regular business hours and pick up a GoodRx coupon card.  - If you need your prescription sent electronically to a different pharmacy, notify our office through [facility] or by phone at [phone] option 4.  Si Usted Necesita Algo Despus de Su Visita  Tambin puede enviarnos un mensaje a travs de Pharmacist, community. Por lo general respondemos a los mensajes de MyChart en el transcurso de 1 a 2 das hbiles.  Para renovar recetas, por favor pida a su farmacia que se ponga en contacto con nuestra oficina. Harland Dingwall de fax es [place] [phone].  Si tiene un asunto urgente cuando la clnica est cerrada y que no puede esperar hasta el siguiente da  hbil, puede llamar/localizar a su doctor(a) al nmero que aparece a continuacin.   Por favor, tenga en cuenta que aunque hacemos todo lo posible para estar disponibles para asuntos urgentes fuera del horario de [place], no estamos disponibles las 24 horas del da, los 7 das de la [place].   Si tiene un problema urgente y no puede comunicarse con nosotros, puede optar por buscar atencin mdica  en el consultorio de su doctor(a), en una clnica privada, en un centro de atencin urgente o en una sala de emergencias.  Si tiene Engineering geologist, por favor llame inmediatamente al 911 o vaya a la sala de emergencias.  Nmeros de bper  - Dr. Nehemiah Massed: [phone]  - Dra. Moye: [phone]  - Dra. Nicole Kindred: [phone]  En caso de inclemencias del [place], por favor llame a Johnsie Kindred principal al [phone] para una actualizacin sobre el [place] de cualquier retraso o cierre.  Consejos para la medicacin en dermatologa: Por favor, guarde las cajas en las que vienen los medicamentos de uso tpico para ayudarle a seguir las instrucciones sobre dnde y cmo usarlos. Las farmacias generalmente imprimen las instrucciones del medicamento slo en las cajas y no directamente en los tubos del [place].   Si su medicamento es muy caro, por favor, pngase en contacto con Zigmund Daniel llamando al [phone] y presione la opcin 4 o envenos un mensaje a travs de Pharmacist, community.   No podemos decirle cul ser su copago por los medicamentos por adelantado ya que esto es diferente dependiendo de la cobertura de su seguro. Sin embargo, es posible que podamos encontrar un medicamento sustituto a Electrical engineer un formulario para que el seguro cubra el medicamento que se considera necesario.   Si se requiere una autorizacin previa para que su compaa de seguros [place] su medicamento, por favor permtanos de 1 a 2 das hbiles para completar este proceso.  Los precios de los medicamentos  varan con frecuencia dependiendo del Environmental consultant de dnde se surte la receta y alguna farmacias pueden ofrecer precios ms baratos.  El sitio web www.goodrx.com tiene cupones para medicamentos de Airline pilot. Los precios aqu no tienen en cuenta lo que podra costar con la ayuda del seguro (puede ser ms barato con su seguro), pero el sitio web puede darle el precio si no utiliz Research scientist (physical sciences).  - Puede imprimir el cupn correspondiente y llevarlo con su receta a la farmacia.  - Tambin puede pasar por nuestra oficina durante el horario de atencin regular y Charity fundraiser una tarjeta de cupones de GoodRx.  - Si necesita que su receta se enve electrnicamente a una farmacia diferente, informe a nuestra oficina a travs de MyChart de  o por telfono llamando al [phone] y presione la opcin 4.

## 2021-11-04 ENCOUNTER — Ambulatory Visit (INDEPENDENT_AMBULATORY_CARE_PROVIDER_SITE_OTHER): Payer: Medicare Other | Admitting: Dermatology

## 2021-11-04 DIAGNOSIS — L219 Seborrheic dermatitis, unspecified: Secondary | ICD-10-CM | POA: Diagnosis not present

## 2021-11-04 DIAGNOSIS — L820 Inflamed seborrheic keratosis: Secondary | ICD-10-CM | POA: Diagnosis not present

## 2021-11-04 DIAGNOSIS — L309 Dermatitis, unspecified: Secondary | ICD-10-CM

## 2021-11-04 MED ORDER — CICLOPIROX 1 % EX SHAM
MEDICATED_SHAMPOO | CUTANEOUS | 2 refills | Status: DC
Start: 2021-11-04 — End: 2022-02-19

## 2021-11-04 MED ORDER — HYDROXYZINE HCL 10 MG PO TABS: ORAL_TABLET | ORAL | 1 refills | Status: DC

## 2021-11-04 MED ORDER — FLUOCINONIDE 0.05 % EX SOLN: 1.0000 "application " | Freq: Two times a day (BID) | CUTANEOUS | 2 refills | Status: DC | PRN

## 2021-11-04 NOTE — Patient Instructions (Addendum)
For Itchy scalp  ? ?Continue fluocinonide 0.05 % solution apply topically to itchy affected areas of scalp before bed  ?Start zoryve cream (samples given) apply cream affected areas of face for itchy rash  ?Continue Ciclopirox shampoo daily lather on to scalp let sit for a few minutes and then rinse.  ?Start hydroxyzine 10 mg - can take 1 - 2 tablets for itch by mouth daily at bedtime. Use caution when taking can cause drowsiness.  ?Stop Clobetasol Shampoo ?Stop Ketoconazole Cream  ?Cryotherapy Aftercare ? ?Wash gently with soap and water everyday.   ?Apply Vaseline and Band-Aid daily until healed.  ? ? ?Seborrheic Keratosis ? ?What causes seborrheic keratoses? ?Seborrheic keratoses are harmless, common skin growths that first appear during adult life.  As time goes by, more growths appear.  Some people may develop a large number of them.  Seborrheic keratoses appear on both covered and uncovered body parts.  They are not caused by sunlight.  The tendency to develop seborrheic keratoses can be inherited.  They vary in color from skin-colored to gray, brown, or even black.  They can be either smooth or have a rough, warty surface.   ?Seborrheic keratoses are superficial and look as if they were stuck on the skin.  Under the microscope this type of keratosis looks like layers upon layers of skin.  That is why at times the top layer may seem to fall off, but the rest of the growth remains and re-grows.   ? ?Treatment ?Seborrheic keratoses do not need to be treated, but can easily be removed in the office.  Seborrheic keratoses often cause symptoms when they rub on clothing or jewelry.  Lesions can be in the way of shaving.  If they become inflamed, they can cause itching, soreness, or burning.  Removal of a seborrheic keratosis can be accomplished by freezing, burning, or surgery. ?If any spot bleeds, scabs, or grows rapidly, please return to have it checked, as these can be an indication of a skin cancer. ? ? ?If You  Need Anything After Your Visit ? ?If you have any questions or concerns for your doctor, please call our main line at [phone] and press option 4 to reach your doctor's medical assistant. If no one answers, please leave a voicemail as directed and we will return your call as soon as possible. Messages left after 4 pm will be answered the following business day.  ? ?You may also send [place] a message via MyChart. We typically respond to MyChart messages within 1-2 business days. ? ?For prescription refills, please ask your pharmacy to contact our office. Our fax number is [phone]. ? ?If you have an urgent issue when the clinic is closed that cannot wait until the next business day, you can page your doctor at the number below.   ? ?Please note that while we do our best to be available for urgent issues outside of office hours, we are not available 24/7.  ? ?If you have an urgent issue and are unable to reach [place], you may choose to seek medical care at your doctor's office, retail clinic, urgent care center, or emergency room. ? ?If you have a medical emergency, please immediately call 911 or go to the emergency department. ? ?Pager Numbers ? ?- Dr. Nehemiah Massed: [phone] ? ?- Dr. Laurence Ferrari: [phone] ? ?- Dr. Nicole Kindred: [phone] ? ?In the event of inclement weather, please call our main line at [phone] for an update on the status of any delays  or closures. ? ?Dermatology Medication Tips: ?Please keep the boxes that topical medications come in in order to help keep track of the instructions about where and how to use these. Pharmacies typically print the medication instructions only on the boxes and not directly on the medication tubes.  ? ?If your medication is too expensive, please contact our office at [phone] option 4 or send [place] a message through [place].  ? ?We are unable to tell what your co-pay for medications will be in advance as this is different depending on your insurance coverage. However,  we may be able to find a substitute medication at lower cost or fill out paperwork to get insurance to cover a needed medication.  ? ?If a prior authorization is required to get your medication covered by your insurance company, please allow [place] 1-2 business days to complete this process. ? ?Drug prices often vary depending on where the prescription is filled and some pharmacies may offer cheaper prices. ? ?The website www.goodrx.com contains coupons for medications through different pharmacies. The prices here do not account for what the cost may be with help from insurance (it may be cheaper with your insurance), but the website can give you the price if you did not use any insurance.  ?- You can print the associated coupon and take it with your prescription to the pharmacy.  ?- You may also stop by our office during regular business hours and pick up a GoodRx coupon card.  ?- If you need your prescription sent electronically to a different pharmacy, notify our office through [facility] or by phone at [phone] option 4. ? ? ? ? ?Si Usted Necesita Algo Despu?s de Su Visita ? ?Tambi?n puede enviarnos un mensaje a trav?s de MyChart. Por lo general respondemos a los mensajes de MyChart en el transcurso de 1 a 2 d?as h?biles. ? ?Para renovar recetas, por favor pida a su farmacia que se ponga en contacto con nuestra oficina. Nuestro n?mero de fax es el [phone]. ? ?Si tiene un asunto urgente cuando la cl?nica est? cerrada y que no puede esperar hasta el siguiente d?a h?bil, puede llamar/localizar a su doctor(a) al n?mero que aparece a continuaci?n.  ? ?Por favor, tenga en cuenta que aunque hacemos todo lo posible para estar disponibles para asuntos urgentes fuera del horario de oficina, no estamos disponibles las 24 horas del d?a, los 7 d?as de la semana.  ? ?Si tiene un problema urgente y no puede comunicarse con nosotros, puede optar por buscar atenci?n m?dica  en el consultorio de su doctor(a), en una  cl?nica privada, en un centro de atenci?n urgente o en una sala de emergencias. ? ?Si tiene Engineer, maintenance (IT) m?dica, por favor llame inmediatamente al 911 o vaya a la sala de emergencias. ? ?N?meros de b?per ? ?- Dr. Nehemiah Massed: [phone] ? ?- Dra. Moye: [phone] ? ?- Dra. Nicole Kindred: [phone] ? ?En caso de inclemencias del tiempo, por favor llame a nuestra l?nea principal al [phone] para una actualizaci?n sobre el estado de cualquier retraso o cierre. ? ?Consejos para la medicaci?n en dermatolog?a: ?Por favor, guarde las cajas en las que vienen los medicamentos de uso t?pico para ayudarle a seguir las instrucciones sobre d?nde y c?mo usarlos. Las farmacias generalmente imprimen las instrucciones del medicamento s?lo en las cajas y no directamente en los tubos del [place].  ? ?Si su medicamento es muy caro, por favor, p?ngase en contacto con Zigmund Daniel llamando al [phone] y presione la opci?n 4  o env?enos un mensaje a trav?s de MyChart.  ? ?No podemos decirle cu?l ser? su copago por los medicamentos por adelantado ya que esto es diferente dependiendo de la cobertura de su seguro. Sin embargo, es posible que podamos encontrar un medicamento sustituto a Electrical engineer un formulario para que el seguro cubra el medicamento que se considera necesario.  ? ?Si se requiere Ardelia Mems autorizaci?n previa para que su compa??a de seguros [place] su medicamento, por favor perm?tanos de 1 a 2 d?as h?biles para completar este proceso. ? ?Los precios de los medicamentos var?an con frecuencia dependiendo del Environmental consultant de d?nde se surte la receta y alguna farmacias pueden ofrecer precios m?s baratos. ? ?El sitio web www.goodrx.com tiene cupones para medicamentos de Airline pilot. Los precios aqu? no tienen en cuenta lo que podr?a costar con la ayuda del seguro (puede ser m?s barato con su seguro), pero el sitio web puede darle el precio si no utiliz? ning?n seguro.  ?- Puede imprimir el cup?n correspondiente y  llevarlo con su receta a la farmacia.  ?- Tambi?n puede pasar por nuestra oficina durante el horario de atenci?n regular y recoger una tarjeta de cupones de GoodRx.  ?- Si necesita que su receta se e

## 2021-11-04 NOTE — Progress Notes (Signed)
? ?Follow-Up Visit ?  ?Subjective  ?Timothy Turner is a 81 y.o. male who presents for the following: Seborrheic Dermatitis (3 month follow up, patient is currently using fluocinonide 0.05 % solution , ciclopirox shampoo, clobetasol shampoo, and ketoconazole cream at face. Patient reports he is still itchy at scalp. He stopped clobetasol shampoo and ketoconazole cream due to causing some discoloration at face. ) and Eczema (3 month follow up.  Patient using protopic and mometasone as needed for flares. ). ? ?The patient has spots, moles and lesions to be evaluated, some may be new or changing and the patient has concerns that these could be cancer. ? ? ?The following portions of the chart were reviewed this encounter and updated as appropriate:   ?  ?Review of Systems: No other skin or systemic complaints except as noted in HPI or Assessment and Plan. ? ? ?Objective  ?Well appearing patient in no apparent distress; mood and affect are within normal limits. ? ?A focused examination was performed including face, b/l cheeks, arms, scalp. Relevant physical exam findings are noted in the Assessment and Plan. ? ?scalp and face ?Erythema with skin atrophy at b/l temples, light violaceous discoloration periocular area, pink excoriated papules on cheeks, and mild scaliness at crown vertex, pt c/o of scalp itching, especially at night ? ?body ?Clear today, pt states not itching on body at this time ? ?left lower cheek x 1, right mid cheek x 1 (2) ?Erythematous stuck-on, waxy papule ? ? 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 ?Vrs Atopic dermatitis ? ?Chronic and persistent condition with duration or expected duration over one year. Condition is symptomatic/ bothersome to patient. Not currently at goal.  ? ?Seborrheic Dermatitis  ?-  is a chronic persistent rash characterized by pinkness and scaling most commonly of the mid face but also can occur on the scalp (dandruff), ears; mid chest, mid back and  groin.  It tends to be exacerbated by stress and cooler weather.  People who have neurologic disease may experience new onset or exacerbation of existing seborrheic dermatitis.  The condition is not curable but treatable and can be controlled. ? ?  ?Start Zoryve Cream (samples given) apply cream qd to aa of face ?Start hydroxyzine 10 mg tablet - take 1 - 2 tabs po qhs prn itch. Use caution can cause drowsiness.  ?Continue fluocinonide 0.05 % solution apply topically to itchy affected areas of scalp before bed prn 60 ml 2 rf  ?Increase Ciclopirox shampoo to daily, let sit 5-10 min prior to rinsing. ?D/c clobetasol shampoo due to risk skin atrophy with long term use ?D/c Ketoconazole 2% cr qd to aa face (pt didn't like) ?  ?Topical steroids (such as triamcinolone, fluocinolone, fluocinonide, mometasone, clobetasol, halobetasol, betamethasone, hydrocortisone) can cause thinning and lightening of the skin if they are used for too long in the same area. Your physician has selected the right strength medicine for your problem and area affected on the body. Please use your medication only as directed by your physician to prevent side effects.  ? ? ?hydrOXYzine (ATARAX) 10 MG tablet - scalp and face ?Take 1 - 2 tabs by mouth at bedtime. Can cause drowsiness, use caution. ? ?fluocinonide (LIDEX) 0.05 % external solution - scalp and face ?Apply 1 application. topically 2 (two) times daily as needed. Apply to itchy places at scalp ? ?Ciclopirox 1 % shampoo - scalp and face ?Massage into scalp, let sit 10 minutes before rinsing. May use up  to daily. ? ?Eczema, unspecified type ?body ? ?Chronic condition with duration or expected duration over one year. Currently well-controlled.  Improved on body, no itching or rash, still itches on scalp ?  ?Cont Protopic 0.1% oint qd/bid periocular ?Cont Mometasone cr qd/bid prn flares ?On Dupixent in past, but stopped due to eye irritation. ? ?Discussed Rinvoq and Adbry  ?Patient deferred  treatment at this time. ? ? Atopic dermatitis (eczema) is a chronic, relapsing, pruritic condition that can significantly affect quality of life. It is often associated with allergic rhinitis and/or asthma and can require treatment with topical medications, phototherapy, or in severe cases biologic injectable medication (Dupixent; Adbry) or Oral JAK inhibitors.  ?  ?Topical steroids (such as triamcinolone, fluocinolone, fluocinonide, mometasone, clobetasol, halobetasol, betamethasone, hydrocortisone) can cause thinning and lightening of the skin if they are used for too long in the same area. Your physician has selected the right strength medicine for your problem and area affected on the body. Please use your medication only as directed by your physician to prevent side effects.   ? ?Inflamed seborrheic keratosis (2) ?left lower cheek x 1, right mid cheek x 1 ? ?Will recheck left lower cheek at next follow up and consider bx if not clear  ? ?Destruction of lesion - left lower cheek x 1, right mid cheek x 1 ? ?Destruction method: cryotherapy   ?Informed consent: discussed and consent obtained   ?Lesion destroyed using liquid nitrogen: Yes   ?Region frozen until ice ball extended beyond lesion: Yes   ?Outcome: patient tolerated procedure well with no complications   ?Post-procedure details: wound care instructions given   ?Additional details:  Prior to procedure, discussed risks of blister formation, small wound, skin dyspigmentation, or rare scar following cryotherapy. Recommend Vaseline ointment to treated areas while healing. ? ? ? ?Return in about 3 months (around 02/04/2022) for seb derm / eczma / recheck isk at left cheek. ? ?I, Ruthell Rummage, CMA, am acting as scribe for Brendolyn Patty, MD. ? ?Documentation: I have reviewed the above documentation for accuracy and completeness, and I agree with the above. ? ?Brendolyn Patty MD  ? ?

## 2021-11-05 ENCOUNTER — Ambulatory Visit: Payer: Medicare Other | Admitting: Dermatology

## 2022-02-17 ENCOUNTER — Ambulatory Visit: Payer: Medicare Other | Admitting: Dermatology

## 2022-02-19 ENCOUNTER — Other Ambulatory Visit: Payer: Self-pay | Admitting: Adult Health

## 2022-02-19 ENCOUNTER — Encounter: Payer: Self-pay | Admitting: Adult Health

## 2022-02-19 ENCOUNTER — Non-Acute Institutional Stay (SKILLED_NURSING_FACILITY): Payer: Medicare Other | Admitting: Adult Health

## 2022-02-19 DIAGNOSIS — I700 Atherosclerosis of aorta: Secondary | ICD-10-CM

## 2022-02-19 DIAGNOSIS — S069X1S Unspecified intracranial injury with loss of consciousness of 30 minutes or less, sequela: Secondary | ICD-10-CM | POA: Diagnosis not present

## 2022-02-19 DIAGNOSIS — S02401S Maxillary fracture, unspecified, sequela: Secondary | ICD-10-CM

## 2022-02-19 DIAGNOSIS — D62 Acute posthemorrhagic anemia: Secondary | ICD-10-CM

## 2022-02-19 DIAGNOSIS — I1310 Hypertensive heart and chronic kidney disease without heart failure, with stage 1 through stage 4 chronic kidney disease, or unspecified chronic kidney disease: Secondary | ICD-10-CM

## 2022-02-19 DIAGNOSIS — J449 Chronic obstructive pulmonary disease, unspecified: Secondary | ICD-10-CM

## 2022-02-19 DIAGNOSIS — E039 Hypothyroidism, unspecified: Secondary | ICD-10-CM

## 2022-02-19 DIAGNOSIS — F4321 Adjustment disorder with depressed mood: Secondary | ICD-10-CM

## 2022-02-19 DIAGNOSIS — G894 Chronic pain syndrome: Secondary | ICD-10-CM

## 2022-02-19 DIAGNOSIS — N1832 Chronic kidney disease, stage 3b: Secondary | ICD-10-CM

## 2022-02-19 DIAGNOSIS — N401 Enlarged prostate with lower urinary tract symptoms: Secondary | ICD-10-CM

## 2022-02-19 DIAGNOSIS — S32592S Other specified fracture of left pubis, sequela: Secondary | ICD-10-CM

## 2022-02-19 DIAGNOSIS — I824Z1 Acute embolism and thrombosis of unspecified deep veins of right distal lower extremity: Secondary | ICD-10-CM

## 2022-02-19 DIAGNOSIS — K2270 Barrett's esophagus without dysplasia: Secondary | ICD-10-CM

## 2022-02-19 DIAGNOSIS — R7303 Prediabetes: Secondary | ICD-10-CM

## 2022-02-19 MED ORDER — PREGABALIN 25 MG PO CAPS: 25.0000 mg | ORAL_CAPSULE | Freq: Two times a day (BID) | ORAL | 0 refills | Status: DC

## 2022-02-19 NOTE — Progress Notes (Signed)
Location:  [facility] Room Number: 476 Place of Service:  SNF (31)   CODE STATUS: full   Allergies  Allergen Reactions   Duloxetine     Unable to urinate   Nsaids Other (See Comments)    CANNOT TAKE NSAIDS BECAUSE OF ESOPHAGUS ISSUES CANNOT TAKE NSAIDS BECAUSE OF ESOPHAGUS ISSUES   Rosuvastatin Other (See Comments)    SEVERE MUSCLE ACHES SEVERE MUSCLE ACHES   Statins Other (See Comments)    SEVERE MUSCLE ACHES SEVERE MUSCLE ACHES Other reaction(s): Other (See Comments) SEVERE MUSCLE ACHES   Tolmetin Other (See Comments)    CANNOT TAKE NSAIDS BECAUSE OF ESOPHAGUS ISSUES CANNOT TAKE NSAIDS BECAUSE OF ESOPHAGUS ISSUES CANNOT TAKE NSAIDS BECAUSE OF ESOPHAGUS ISSUES CANNOT TAKE NSAIDS BECAUSE OF ESOPHAGUS ISSUES    Chief Complaint  Patient presents with   Hospitalization Follow-up    HPI:  He is an 81 year old man who has had an extensive hospitalization with inpatient rehab from July to 02-18-22. He fell from a ladder at 20 feet; with the ladder falling on him. He had loss of consciousness. Upon admission to inpatient rehab are the known injuries: left frontal SAH with adjacent contusion, trace SDH; left maxillary sinus fractures minimally displaced; left ant/lat/posterior orbital floor fracture; left acetabulum fracture; left inferior pubic rami fracture; left pelvic hematoma; possible external left iliac vein injury and concerned for extravasation within the proximal left lateral hip musculature. And facial laceration. 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  He was admitted to [facility] inpatient rehab to improve upon his functional strength balance and endurance in order to facilitate  safe performance of his ADLS. He participated in speech pathology for cognitive and communication needs.  Moderate TBI/left frontal  subarachnoid hemorrhage/trace subdural hematoma/trace subdural hematoma.  He required a ct scan due to altered mental status and agitation. It demonstrated possible new IPH/contusion vs brain mass. The follow up ct scan demonstrate these findings. Felt to be artifact. He required the use of zyprexa and atarax; he has been transitioned to depakote. He is on aricept to improve upon his cognition.  Left acetabulum fracture/left inferior pubic rami fracture: he was placed on traction 7/25 and removed on 7/27. The injury was determined to be severe non-reconstructable therefore nonoperative intervention. He is NWB left lower extremity.  Left maxillary sinus fractures/left orbital floor fracture: ENT did see patient; no acute operative intervention. May need possible outpatient delayed surgical intervention. He has completed his keflex for prophylaxis.  He was incontinent of bladder and bowel; which have improved.  Right lower extremity DVT: he is on eliquis; has a history in the past of dvt and PE.  Pain: his pain is presently managed does not need oxycodone; this will be stopped. His current regimen is effective.  He is here to complete his inpatient rehab. His goal is to return back home. He tells me that he is feeling good; no pain; does have some insomnia; has a good appetite. He will continue to be followed for his chronic illnesses including:  Chronic pain syndrome: Benign prostatic hyperplasia with lower urinary tract symptoms symptom details unspecified:  Stage 3b chronic kidney disease Acquired hypothyroidism  Past Medical History:  Diagnosis Date   Aspiration precautions    uses thicken in liquids.  Eats pureed food.   Barrett esophagus    Depression    Gallstones  GERD (gastroesophageal reflux disease)    Heart murmur    History of   Hemorrhoids    Hypertension    Hypothyroidism    Iron deficiency anemia 02/10/2012   Mitral valve disease    Myalgia    Multiple   Neuromuscular  disorder (HCC)    PAD (peripheral artery disease) (HCC)    lower legs   Pain syndrome, chronic    Severe   Pneumonia    three times, last time >10 years ago   Psoriasis    Pulmonary embolism ([place]) 2019   left lung   Rheumatic heart valve incompetence    Shortness of breath dyspnea    Thyroiditis     Past Surgical History:  Procedure Laterality Date   BACK SURGERY     CARDIAC CATHETERIZATION N/A 02/22/2015   Procedure: Left Heart Cath and Coronary Angiography;  Surgeon: Minna Merritts, MD;  Location: [facility];  Service: Cardiovascular;  Laterality: N/A;   CATARACT EXTRACTION W/PHACO Left 07/29/2016   Procedure: CATARACT EXTRACTION PHACO AND INTRAOCULAR LENS PLACEMENT (I[facility])  Left eye IVA Topical;  Surgeon: Leandrew Koyanagi, MD;  Location: [facility];  Service: Ophthalmology;  Laterality: Left;   CATARACT EXTRACTION W/PHACO Right 08/19/2016   Procedure: CATARACT EXTRACTION PHACO AND INTRAOCULAR LENS PLACEMENT ([place]) right  TORIC;  Surgeon: Leandrew Koyanagi, MD;  Location: [facility];  Service: Ophthalmology;  Laterality: Right;  prefers early morning   CHOLECYSTECTOMY  08/03/13   COLONOSCOPY  03-08-12   Dr Donnella Sham   COLONOSCOPY WITH PROPOFOL N/A 06/17/2017   Procedure: COLONOSCOPY WITH PROPOFOL;  Surgeon: [name], MD;  Location: [facility] ENDOSCOPY;  Service: Gastroenterology;  Laterality: N/A;   DIRECT LARYNGOSCOPY N/A 02/17/2016   Procedure: DIRECT LARYNGOSCOPY;  Surgeon: Jodi Marble, MD;  Location: [place] [facility];  Service: ENT;  Laterality: N/A;   ESOPHAGEAL MANOMETRY N/A 02/27/2015   Procedure: ESOPHAGEAL MANOMETRY (EM);  Surgeon: Lollie Sails, MD;  Location: [facility] ENDOSCOPY;  Service: Endoscopy;  Laterality: N/A;   ESOPHAGOGASTRODUODENOSCOPY (EGD) WITH PROPOFOL N/A 02/18/2015   Procedure: ESOPHAGOGASTRODUODENOSCOPY (EGD) WITH PROPOFOL;  Surgeon: Lollie Sails, MD;  Location[facility] END[facility]COPY;  Service: Endoscopy;  Laterality: N/A;    ESOPHAGOGASTRODUODENOSCOPY (EGD) WITH PROPOFOL N/A 06/17/2017   Procedure: ESOPHAGOGASTRODUODENOSCOPY (EGD) WITH PROPOFOL;  Surgeon: [name], MD;  Location: [facility] ENDOSCOPY;  Service: Gastroenterology;  Laterality: N/A;   ESOPHAGOSCOPY W/ BOTOX INJECTION N/A 02/17/2016   Procedure: ESOPHAGOSCOPY WITH BOTOX INJECTION  ;  Surgeon: Jodi Marble, MD;  Location: [facility];  Service: ENT;  Laterality: N/A;   HEMORROIDECTOMY     UPPER GI ENDOSCOPY  03-06-2013   Dr Donnella Sham    Social History   Socioeconomic History   Marital status: Married    Spouse name: Not on file   Number of children: 2   Years of education: Not on file   Highest education level: Not on file  Occupational History   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   Social Determinants of Health   Financial Resource Strain: Low Risk  (08/26/2017)   Overall Financial Resource Strain (CARDIA)    Difficulty of Paying Living Expenses: Not hard at all  Food Insecurity: No Food Insecurity (08/26/2017)   Hunger Vital Sign    Worried About Running Out of Food in the Last Year: Never true    Ran Out of Food in the Last Year: Never true  Transportation Needs: No Transportation Needs (08/26/2017)   PRAPARE - Hydrologist (Medical): No    Lack of Transportation (Non-Medical): No  Physical Activity: Sufficiently Active (08/30/2018)   Exercise Vital  Sign    Days of Exercise per Week: 7 days    Minutes of Exercise per Session: 60 min  Stress: No Stress Concern Present (08/30/2018)   [place]    Feeling of Stress : Not at all  Social Connections: Not on file  Intimate Partner Violence: Not At Risk (08/30/2018)   Humiliation, Afraid, Rape, and Kick questionnaire    Fear of Current or Ex-Partner: No    Emotionally Abused: No    Physically Abused: No    Sexually Abused: No   Family History  Problem Relation Age of Onset   Heart failure Mother    Heart failure Father    Emphysema Father        smoked   Cancer Sister    Aneurysm Sister        Brain      VITAL SIGNS BP (!) 158/70   Pulse 84   Temp 98.5 F (36.9 C)   Resp 20   Ht 6' (1.829 m)   Wt 165 lb 6.4 oz (75 kg)   SpO2 95%   BMI 22.43 kg/m   Outpatient Encounter Medications as of 02/19/2022  Medication Sig   apixaban (ELIQUIS) 5 MG TABS tablet Take 5 mg by mouth 2 (two) times daily.   divalproex (DEPAKOTE) 250 MG DR tablet Take 250 mg by mouth 3 (three) times daily.   donepezil (ARICEPT) 5 MG tablet Take 5 mg by mouth at bedtime.   finasteride (PROSCAR) 5 MG tablet Take 1 tablet (5 mg total) by mouth daily.   levothyroxine (SYNTHROID) 88 MCG tablet Take 88 mcg by mouth daily before breakfast.   losartan (COZAAR) 25 MG tablet Take 1 tablet by mouth daily.   methocarbamol (ROBAXIN) 500 MG tablet Take 500 mg by mouth 4 (four) times daily.   mirtazapine (REMERON) 7.5 MG tablet Take 7.5 mg by mouth at bedtime.   pantoprazole (PROTONIX) 40 MG tablet Take 40 mg by mouth daily.   tamsulosin (FLOMAX) 0.4 MG CAPS capsule Take 0.4 mg by mouth.   Tiotropium Bromide-Olodaterol (STIOLTO RESPIMAT) 2.5-2.5 MCG/ACT AERS Inhale 2 each into the lungs daily at 2 PM.   valACYclovir (VALTREX) 500 MG tablet Take 500 mg by mouth daily.   pregabalin (LYRICA) 25 MG capsule Take 25 mg by mouth 2 (two) times daily.   No  facility-administered encounter medications on file as of 02/19/2022.     SIGNIFICANT DIAGNOSTIC EXAMS  TODAY  01-23-22: MRI head: -- Left frontal intraparenchymal hematoma with adjacent vasogenic edema and small volume left frontal subarachnoid hemorrhage. These findings are felt posttraumatic. No  definite underlying mass is visualized.    LABS REVIEWED TODAY   10-23-21: hgb a1c 6.1 01-13-22: tsh 2.402; drug screen: + fentanyl 01-22-22: protein 5.4; albumin 3.0 02-16-22: wbc 6.5; hgb 11.4; hct 35.6; mcv 995.3 plt 209; glucose 100; bun 17; creat 1.00; k+ 4.1; na++ 144; ca 9.3; ca 7.7   Review of Systems  Constitutional:  Negative for malaise/fatigue.  Respiratory:  Negative for cough and shortness of breath.   Cardiovascular:  Negative for chest pain, palpitations and leg swelling.  Gastrointestinal:  Negative for abdominal pain, constipation and heartburn.  Musculoskeletal:  Negative for back pain, joint pain and myalgias.  Skin: Negative.   Neurological:  Negative for dizziness.  Psychiatric/Behavioral:  The patient is not nervous/anxious.    Physical Exam Constitutional:      General: He is not in acute distress.    Appearance: He is well-developed. He is not diaphoretic.  Neck:     Thyroid: No thyromegaly.  Cardiovascular:     Rate and Rhythm: Normal rate and regular rhythm.     Pulses: Normal pulses.     Heart sounds: Normal heart sounds.  Pulmonary:     Effort: Pulmonary effort is normal. No respiratory distress.     Breath sounds: Normal breath sounds.  Abdominal:     General: Bowel sounds are normal. There is no distension.     Palpations: Abdomen is soft.     Tenderness: There is no abdominal tenderness.  Musculoskeletal:        General: Normal range of motion.     Cervical back: Neck supple.     Right lower leg: No edema.     Left lower leg: No edema.  Lymphadenopathy:     Cervical: No cervical adenopathy.  Skin:    General: Skin is warm and dry.  Neurological:      Mental Status: He is alert and oriented to person, place, and time.  Psychiatric:        Mood and Affect: Mood normal.      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 Will continue robaxin 500 mg four times daily aricept 5 mg nightly for cognition   2. Calcification of abdominal aorta: per ct scan    3. Lower leg DVT (deep vein thrombosis) acute right: he has a history of dvt and pe. Will be on long term eliquis 5 mg twice daily   4. Chronic pain syndrome: his urine drug screen 01-13-22: + fentanyl is presently taking lyrica 25 mg twice daily   5. Benign prostatic hyperplasia with lower urinary tract symptoms symptom details unspecified: will continue proscar 5 mg daily flomax 0.4 mg daily   6  Stage 3b chronic kidney disease bun 17; creat 1.00; gfr 77  7. Acquired hypothyroidism: tsh 2.402 will continue synthroid 88 mcg daily   8. Chronic obstructive pulmonary disease unspecified COPD type: will continue stiolto respmit 2.5/2.5 mcg 2 puffs daily   9. Pre-diabetes: hgb a1c 6.1  10. 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 Barrett's esophagitis without dysplasia: will continue protonix 40 mg daily   12.  Acute blood loss anemia: hgb 11.4; hct 35.6   13. Mood disorder with depressed  mood: will continue depakote 250 mg three times daily for mood stabilization and remreon 7.5 nightly    Ok Edwards NP [facility]   call [phone]

## 2022-02-20 ENCOUNTER — Encounter: Payer: Self-pay | Admitting: Internal Medicine

## 2022-02-20 ENCOUNTER — Non-Acute Institutional Stay (SKILLED_NURSING_FACILITY): Payer: Medicare Other | Admitting: Internal Medicine

## 2022-02-20 DIAGNOSIS — D62 Acute posthemorrhagic anemia: Secondary | ICD-10-CM | POA: Diagnosis not present

## 2022-02-20 DIAGNOSIS — R4182 Altered mental status, unspecified: Secondary | ICD-10-CM | POA: Insufficient documentation

## 2022-02-20 DIAGNOSIS — I1310 Hypertensive heart and chronic kidney disease without heart failure, with stage 1 through stage 4 chronic kidney disease, or unspecified chronic kidney disease: Secondary | ICD-10-CM | POA: Insufficient documentation

## 2022-02-20 DIAGNOSIS — I824Z1 Acute embolism and thrombosis of unspecified deep veins of right distal lower extremity: Secondary | ICD-10-CM | POA: Insufficient documentation

## 2022-02-20 DIAGNOSIS — R410 Disorientation, unspecified: Secondary | ICD-10-CM

## 2022-02-20 DIAGNOSIS — I824Y1 Acute embolism and thrombosis of unspecified deep veins of right proximal lower extremity: Secondary | ICD-10-CM | POA: Diagnosis not present

## 2022-02-20 DIAGNOSIS — Z87828 Personal history of other (healed) physical injury and trauma: Secondary | ICD-10-CM

## 2022-02-20 DIAGNOSIS — S32592S Other specified fracture of left pubis, sequela: Secondary | ICD-10-CM | POA: Insufficient documentation

## 2022-02-20 DIAGNOSIS — I82409 Acute embolism and thrombosis of unspecified deep veins of unspecified lower extremity: Secondary | ICD-10-CM | POA: Insufficient documentation

## 2022-02-20 DIAGNOSIS — S069XAA Unspecified intracranial injury with loss of consciousness status unknown, initial encounter: Secondary | ICD-10-CM | POA: Insufficient documentation

## 2022-02-20 DIAGNOSIS — S02401S Maxillary fracture, unspecified, sequela: Secondary | ICD-10-CM | POA: Insufficient documentation

## 2022-02-20 DIAGNOSIS — R7303 Prediabetes: Secondary | ICD-10-CM | POA: Insufficient documentation

## 2022-02-20 NOTE — Assessment & Plan Note (Signed)
Patient is alert and oriented at this time.  He does have memory deficit for the acute event but otherwise exhibits no dramatic neurocognitive deficits.

## 2022-02-20 NOTE — Patient Instructions (Signed)
See assessment and plan under each diagnosis in the problem list and acutely for this visit 

## 2022-02-20 NOTE — Progress Notes (Signed)
NURSING HOME LOCATION:  [facility] ROOM NUMBER:  131 P  CODE STATUS:  Full Code  PCP:  Adrian Prows MD  This is a comprehensive admission note to this SNFperformed on this date less than 30 days from date of admission. Included are preadmission medical/surgical history; reconciled medication list; family history; social history and comprehensive review of systems.  Corrections and additions to the records were documented. Comprehensive physical exam was also performed. Additionally a clinical summary was entered for each active diagnosis pertinent to this admission in the Problem List to enhance continuity of care.  HPI: The patient was hospitalized at [facility] 7/25 - 01/26/2022  following a mechanical fall from a ladder resulting in TBI, SAH, internal bleeding, and extensive musculoskeletal injury.  Initially he was in the SICU 7/25-7/27 until he was transferred to the floor. Injury sustained in the fall include left frontal subarachnoid hemorrhage with adjacent contusion, trace subdural hematoma, left maxillary sinus fracture with minimal displacement, left anterior/lateral/posterior orbital floor fracture, left acetabulum fracture, left inferior pubic rami fracture, pelvic hematoma, possible external left iliac vein injury and facial laceration. Facial laceration about the left lateral eye was repaired by ENT. Keflex as wound prophylaxis was administered from 7/26 - 8/2.  Wound Care consulted.  He was placed in traction 7/25 for the acetabular and left inferior pubic rami fracture.  These injuries were felt to be non reconstructable. A pelvic hematoma was attributed to possible external left iliac vein injury with concern for extravasation as hemoglobin dropped from 12.8 to 9.7 for which he received 1 unit of packed cells.  Vascular imaging did not reveal any acute bleed and external compression was recommended.  Hemoglobin did stabilize for the duration of the  hospitalization. Lovenox prophylaxis was initiated 8/2 but was discontinued on 8/3 due to AMS manifested as delirium with concern for IPH. CNS imaging was repeated; this did not reveal any acute process.  He was treated with as needed Haldol and hydroxyzine.  These were subsequently weaned and he was transitioned to Depakote as a mood stabilizer.  Aricept daily to help cognitive impairment was initiated.  Clinically he transitioned out of posttraumatic amnesia based on GOAT score. Lovenox was resumed but subsequently changed to Eliquis because of new RLE DVT. He did exhibit leukocytosis with white counts as high as 21,600 , attributed to stress marginalization without signs of active infection. This did trend down and was 13.5 at discharge. He was discharged to inpatient rehab 8/7 - 8/30.  He progressed well; NWB to LLE was continued with transfer only on the RLE. He was not discharged home as his wife, his primary caregiver,has had mesh abdominal implant for pelvic herniation and has an 8 pound weight lifting limitation.  Instead he was discharged to [facility] SNF to complete rehab with PT/OT until independent.  Past medical and surgical history: Includes history of Barrett esophagus, GERD, essential hypertension, hypothyroidism, history of iron deficiency anemia, PAD, history of recurrent pneumonia, history of prostate cancer, psoriasis, history of PTE, rheumatic valvular heart disease, and history of chronic pain syndrome. Significant procedures and surgeries include cholecystectomy, colonoscopy,several EGDs, and cardiac catheterization.  Social history:30 pack year history of smoking; non drinker.  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  Family history: reviewed, both parents had heart failure.   Review of systems: He is alert and oriented and an excellent historian except for amnesia related to the actual fall.  He states he was on a ladder  installing security camera to connect to his computer when he fell.  He states that he experienced sudden "blankness" with no further memory until he awoke in the hospital.  There was no definite cardiac or neurologic prodrome prior to the fall to his knowledge. He had been on a muscle relaxant 3 times daily for a week prior to the fall.  This was taken for diffuse muscle aching which he described as "throbbing."  He does validate that his memory is improving and is now "minimal" while it was previously "severe." He validates that he had rheumatic fever at age 89 or 59 and has been told he has a heart murmur.  Constitutional: No fever, significant weight change, fatigue  Eyes: No redness, discharge, pain, vision change ENT/mouth: No nasal congestion, purulent discharge, earache, change in hearing, sore throat  Cardiovascular: No chest pain, palpitations, paroxysmal nocturnal dyspnea, claudication, edema  Respiratory: No cough, sputum production, hemoptysis, DOE, significant snoring, apnea Gastrointestinal: No heartburn, dysphagia, abdominal pain, nausea /vomiting, rectal bleeding, melena, change in bowels Genitourinary: No dysuria, hematuria, pyuria, incontinence, nocturia Dermatologic: No rash, pruritus, change in appearance of skin Neurologic: No dizziness, headache, syncope, seizures, numbness, tingling Psychiatric: No significant anxiety, depression, insomnia, anorexia Endocrine: No change in hair/skin/nails, excessive thirst, excessive hunger, excessive urination  Hematologic/lymphatic: No significant bruising, lymphadenopathy, abnormal bleeding Allergy/immunology: No itchy/watery eyes, significant sneezing, urticaria, angioedema  Physical exam:  Pertinent or positive findings: He appears younger than stated age.  He is thin but appears adequately nourished.  He looks amazingly good considering the severe trauma he has experienced.  There is hyperpigmentation over the left malar and temporal  areas.  He has complete dentures.  He has diffuse low-grade rhonchi.  Grade 1 systolic murmur is present.  Pedal pulses are surprisingly good.  Interosseous wasting of the hands is present and the limbs are thin without frank atrophy.  There is a dressing over the left elbow.  There is a linear scar over the anterior right shin.  General appearance: no acute distress, increased work of breathing is present.   Lymphatic: No lymphadenopathy about the head, neck, axilla. Eyes: No conjunctival inflammation or lid edema is present. There is no scleral icterus. Ears:  External ear exam shows no significant lesions or deformities.   Nose:  External nasal examination shows no deformity or inflammation. Nasal mucosa are pink and moist without lesions, exudates Oral exam: There is no oropharyngeal erythema or exudate. Neck:  No thyromegaly, masses, tenderness noted.    Heart:  Normal rate and regular rhythm. S1 and S2 normal without gallop, click, rub.  Lungs:  without wheezes,  rales, rubs. Abdomen: Bowel sounds are normal.  Abdomen is soft and nontender with no organomegaly, hernias, masses. GU: Deferred  Extremities:  No cyanosis, clubbing, edema. Neurologic exam: Balance, Rhomberg, finger to nose testing could not be completed due to clinical state Skin: Warm & dry w/o tenting. No significant rash.  See clinical summary under each active problem in the Problem List with associated updated therapeutic plan

## 2022-02-20 NOTE — Assessment & Plan Note (Addendum)
NWB to LLE as he completes rehab with PT/OT at SNF. F/U ENT consultation post discharge as scheduled to reassess maxillary sinus injury.

## 2022-02-20 NOTE — Assessment & Plan Note (Signed)
Complete 12 weeks of Eliquis prophylaxis with subsequent reassessment of long-term prophylaxis need.

## 2022-02-20 NOTE — Assessment & Plan Note (Addendum)
No bleeding dyscrasias reported at SNF on Eliquis.  Continue to monitor.

## 2022-02-25 ENCOUNTER — Encounter: Payer: Self-pay | Admitting: Adult Health

## 2022-02-25 ENCOUNTER — Non-Acute Institutional Stay (SKILLED_NURSING_FACILITY): Payer: Medicare Other | Admitting: Adult Health

## 2022-02-25 ENCOUNTER — Other Ambulatory Visit: Payer: Self-pay | Admitting: Adult Health

## 2022-02-25 DIAGNOSIS — I824Z1 Acute embolism and thrombosis of unspecified deep veins of right distal lower extremity: Secondary | ICD-10-CM

## 2022-02-25 DIAGNOSIS — S32592S Other specified fracture of left pubis, sequela: Secondary | ICD-10-CM | POA: Diagnosis not present

## 2022-02-25 DIAGNOSIS — S069X1S Unspecified intracranial injury with loss of consciousness of 30 minutes or less, sequela: Secondary | ICD-10-CM

## 2022-02-25 DIAGNOSIS — S02401S Maxillary fracture, unspecified, sequela: Secondary | ICD-10-CM | POA: Diagnosis not present

## 2022-02-25 MED ORDER — TAMSULOSIN HCL 0.4 MG PO CAPS: 0.4000 mg | ORAL_CAPSULE | Freq: Every day | ORAL | 0 refills | Status: DC

## 2022-02-25 MED ORDER — FINASTERIDE 5 MG PO TABS: 5.0000 mg | ORAL_TABLET | Freq: Every day | ORAL | 0 refills | Status: DC

## 2022-02-25 MED ORDER — METHOCARBAMOL 500 MG PO TABS: 500.0000 mg | ORAL_TABLET | Freq: Four times a day (QID) | ORAL | 0 refills | Status: DC

## 2022-02-25 MED ORDER — DONEPEZIL HCL 5 MG PO TABS: 5.0000 mg | ORAL_TABLET | Freq: Every day | ORAL | 0 refills | Status: AC

## 2022-02-25 MED ORDER — MIRTAZAPINE 7.5 MG PO TABS: 7.5000 mg | ORAL_TABLET | Freq: Every day | ORAL | 0 refills | Status: AC

## 2022-02-25 MED ORDER — PREGABALIN 25 MG PO CAPS
25.0000 mg | ORAL_CAPSULE | Freq: Two times a day (BID) | ORAL | 0 refills | Status: DC
Start: 2022-02-25 — End: 2023-04-06

## 2022-02-25 MED ORDER — STIOLTO RESPIMAT 2.5-2.5 MCG/ACT IN AERS: 2.0000 | INHALATION_SPRAY | Freq: Every day | RESPIRATORY_TRACT | 0 refills | Status: AC

## 2022-02-25 MED ORDER — APIXABAN 5 MG PO TABS: 5.0000 mg | ORAL_TABLET | Freq: Two times a day (BID) | ORAL | 0 refills | Status: DC

## 2022-02-25 MED ORDER — LOSARTAN POTASSIUM 25 MG PO TABS: 25.0000 mg | ORAL_TABLET | Freq: Every day | ORAL | 0 refills | Status: AC

## 2022-02-25 MED ORDER — LEVOTHYROXINE SODIUM 88 MCG PO TABS: 88.0000 ug | ORAL_TABLET | Freq: Every day | ORAL | 0 refills | Status: AC

## 2022-02-25 MED ORDER — VALACYCLOVIR HCL 500 MG PO TABS: 500.0000 mg | ORAL_TABLET | Freq: Every day | ORAL | 0 refills | Status: DC

## 2022-02-25 MED ORDER — PANTOPRAZOLE SODIUM 40 MG PO TBEC: 40.0000 mg | DELAYED_RELEASE_TABLET | Freq: Every day | ORAL | 0 refills | Status: AC

## 2022-02-25 MED ORDER — DIVALPROEX SODIUM 250 MG PO DR TAB: 250.0000 mg | DELAYED_RELEASE_TABLET | Freq: Three times a day (TID) | ORAL | 0 refills | Status: DC

## 2022-02-25 NOTE — Progress Notes (Signed)
Location:  [facility] Room Number: 235 Place of Service:  SNF [phone])  Provider: Ok Edwards np   PCP: Leonel Ramsay, MD Patient Care Team: Leonel Ramsay, MD as PCP - General (Infectious Diseases) Byrnett, Forest Gleason, MD (General Surgery) Jackolyn Confer, MD (Internal Medicine)  Extended Emergency Contact Information Primary Emergency Contact: [facility] Address: [address]          [place], [place] [address] Johnnette Litter of [place] Phone: [phone] Mobile Phone: [phone] Relation: Spouse  Code Status: full  Goals of care:  Advanced Directive information    12/14/2019   11:08 AM  Advanced Directives  Does Patient Have a Medical Advance Directive? Yes  Type of Paramedic of [place];Living will  Does patient want to make changes to medical advance directive? No - Patient declined  Copy of [place] in Chart? No - copy requested     Allergies  Allergen Reactions   Duloxetine     Unable to urinate   Nsaids Other (See Comments)    CANNOT TAKE NSAIDS BECAUSE OF ESOPHAGUS ISSUES CANNOT TAKE NSAIDS BECAUSE OF ESOPHAGUS ISSUES   Rosuvastatin Other (See Comments)    SEVERE MUSCLE ACHES SEVERE MUSCLE ACHES   Statins Other (See Comments)    SEVERE MUSCLE ACHES SEVERE MUSCLE ACHES Other reaction(s): Other (See Comments) SEVERE MUSCLE ACHES   Tolmetin Other (See Comments)    CANNOT TAKE NSAIDS BECAUSE OF ESOPHAGUS ISSUES CANNOT TAKE NSAIDS BECAUSE OF ESOPHAGUS ISSUES CANNOT TAKE NSAIDS BECAUSE OF ESOPHAGUS ISSUES CANNOT TAKE NSAIDS BECAUSE OF ESOPHAGUS ISSUES    Chief Complaint  Patient presents with   Acute Visit    Follow up status     HPI:  81 y.o. male  being discharged to home with home health for pt/ot. He will need his prescriptions written and will need to follow up with his medical provider. He will need a wheelchair to maintain his current level  of independence with his adls. He had been hospitalized for a fall off a ladder; with multiple fractures; e was admitted to inpatient rehab. He was then admitted to this facility for short term rehab. He is now ready to return back home.     Past Medical History:  Diagnosis Date   Aspiration precautions    uses thicken in liquids.  Eats pureed food.   Barrett esophagus    Depression    Gallstones    GERD (gastroesophageal reflux disease)    Heart murmur    History of   Hemorrhoids    Hypertension    Hypothyroidism    Iron deficiency anemia 02/10/2012   Mitral valve disease    Myalgia    Multiple   Neuromuscular disorder (HCC)    PAD (peripheral artery disease) (HCC)    lower legs   Pain syndrome, chronic    Severe   Pneumonia    three times, last time >10 years ago   Psoriasis    Pulmonary embolism ([place]) 2019   left lung   Rheumatic heart valve incompetence    Shortness of breath dyspnea    Thyroiditis     Past Surgical History:  Procedure Laterality Date   BACK SURGERY     CARDIAC CATHETERIZATION N/A 02/22/2015   Procedure: Left Heart Cath and Coronary Angiography;  Surgeon: Minna Merritts, MD;  Location: [place] [facility];  Service: Cardiovascular;  Laterality: N/A;   CATARACT EXTRACTION W/PHACO Left 07/29/2016  Procedure: CATARACT EXTRACTION PHACO AND INTRAOCULAR LENS PLACEMENT (IOC)  Left eye IVA Topical;  Surgeon: Leandrew Koyanagi, MD;  Location: [facility];  Service: Ophthalmology;  Laterality: Left;   CATARACT EXTRACTION W/PHACO Right 08/19/2016   Procedure: CATARACT EXTRACTION PHACO AND INTRAOCULAR LENS PLACEMENT ([place]) right  TORIC;  Surgeon: Leandrew Koyanagi, MD;  Location: [facility];  Service: Ophthalmology;  Laterality: Right;  prefers early morning   CHOLECYSTECTOMY  08/03/13   COLONOSCOPY  03-08-12   Dr Donnella Sham   COLONOSCOPY WITH PROPOFOL N/A 06/17/2017   Procedure: COLONOSCOPY WITH PROPOFOL;  Surgeon: [name], MD;  Location:  [facility] ENDOSCOPY;  Service: Gastroenterology;  Laterality: N/A;   DIRECT LARYNGOSCOPY N/A 02/17/2016   Procedure: DIRECT LARYNGOSCOPY;  Surgeon: Jodi Marble, MD;  Location: [place];  Service: ENT;  Laterality: N/A;   ESOPHAGEAL MANOMETRY N/A 02/27/2015   Procedure: ESOPHAGEAL MANOMETRY (EM);  Surgeon: Lollie Sails, MD;  Location: [facility] ENDOSCOPY;  Service: Endoscopy;  Laterality: N/A;   ESOPHAGOGASTRODUODENOSCOPY (EGD) WITH PROPOFOL N/A 02/18/2015   Procedure: ESOPHAGOGASTRODUODENOSCOPY (EGD) WITH PROPOFOL;  Surgeon: Lollie Sails, MD;  Location:  [facility] ENDOSCOPY;  Service: Endoscopy;  Laterality: N/A;   ESOPHAGOGASTRODUODENOSCOPY (EGD) WITH PROPOFOL N/A 06/17/2017   Procedure: ESOPHAGOGASTRODUODENOSCOPY (EGD) WITH PROPOFOL;  Surgeon: [name], MD;  Location[facility] END[facility]COPY;  Service: Gastroenterology;  Laterality: N/A;   ESOPHAGOSCOPY W/ BOTOX INJECTION N/A 02/17/2016   Procedure: ESOPHAGOSCOPY WITH BOTOX INJECTION  ;  Surgeon: Jodi Marble, MD;  Location: [place];  Service: ENT;  Laterality: N/A;   HEMORROIDECTOMY     UPPER GI ENDOSCOPY  03-06-2013   Dr Donnella Sham      reports that he quit smoking about 17 years ago. His smoking use included cigarettes. He has a 30.00 pack-year smoking history. He has never used smokeless tobacco. He reports that he does not drink alcohol and does not use drugs. 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   Social Determinants of Health   Financial Resource Strain: Low Risk  (08/26/2017)   Overall Financial Resource Strain (CARDIA)    Difficulty of Paying Living Expenses: Not hard at all  Food Insecurity: No Food Insecurity (08/26/2017)   Hunger Vital Sign    Worried About Running Out of Food in the Last Year: Never true    Ran Out of Food in the Last Year: Never true  Transportation Needs: No Transportation Needs (08/26/2017)   PRAPARE - Hydrologist (Medical): No    Lack  of Transportation (Non-Medical): No  Physical Activity: Sufficiently Active (08/30/2018)   Exercise Vital Sign    Days of Exercise per Week: 7 days    Minutes of Exercise per Session: 60 min  Stress: No Stress Concern Present (08/30/2018)   [place]    Feeling of Stress : Not at all  Social Connections: Not on file  Intimate Partner Violence: Not At Risk (08/30/2018)   Humiliation, Afraid, Rape, and Kick questionnaire    Fear of Current or Ex-Partner: No    Emotionally Abused: No    Physically Abused: No    Sexually Abused: No   Functional Status Survey:    Allergies  Allergen Reactions   Duloxetine     Unable to urinate   Nsaids Other (See Comments)    CANNOT TAKE NSAIDS BECAUSE OF ESOPHAGUS ISSUES CANNOT TAKE NSAIDS BECAUSE OF ESOPHAGUS ISSUES   Rosuvastatin Other (See Comments)    SEVERE MUSCLE ACHES SEVERE MUSCLE ACHES   Statins Other (See Comments)    SEVERE MUSCLE ACHES SEVERE MUSCLE ACHES Other reaction(s): Other (See Comments) SEVERE MUSCLE ACHES   Tolmetin Other (See Comments)    CANNOT TAKE NSAIDS BECAUSE OF ESOPHAGUS ISSUES CANNOT TAKE NSAIDS BECAUSE OF  ESOPHAGUS ISSUES CANNOT TAKE NSAIDS BECAUSE OF ESOPHAGUS ISSUES CANNOT TAKE NSAIDS BECAUSE OF ESOPHAGUS ISSUES    Pertinent  Health Maintenance Due  Topic Date Due   INFLUENZA VACCINE  01/20/2022   COLONOSCOPY (Pts 45-40yr Insurance coverage will need to be confirmed)  Discontinued    Medications: Outpatient Encounter Medications as of 02/25/2022  Medication Sig   apixaban (ELIQUIS) 5 MG TABS tablet Take 1 tablet (5 mg total) by mouth 2 (two) times daily.   divalproex (DEPAKOTE) 250 MG DR tablet Take 1 tablet (250 mg total) by mouth 3 (three) times daily.   donepezil (ARICEPT) 5 MG tablet Take 1 tablet (5 mg total) by mouth at bedtime.   finasteride (PROSCAR) 5 MG tablet Take 1 tablet (5 mg total) by mouth daily.   levothyroxine (SYNTHROID) 88 MCG tablet Take 1 tablet (88 mcg total) by mouth daily before breakfast.   losartan (COZAAR) 25 MG tablet Take 1 tablet (25 mg total) by mouth daily.   methocarbamol (ROBAXIN) 500 MG tablet Take 1 tablet (500 mg total) by mouth 4 (four) times daily.   mirtazapine (REMERON) 7.5 MG tablet Take 1 tablet (7.5 mg total) by mouth at bedtime.   pantoprazole (PROTONIX) 40 MG tablet Take 1 tablet (40 mg total) by mouth daily.   pregabalin (LYRICA) 25 MG capsule Take 1 capsule (25 mg total) by mouth 2 (two) times daily.   tamsulosin (FLOMAX) 0.4 MG CAPS capsule Take 1 capsule (0.4 mg total) by mouth daily after supper.   Tiotropium Bromide-Olodaterol (STIOLTO RESPIMAT) 2.5-2.5 MCG/ACT AERS Inhale 2 each into the lungs daily at 2 PM.   valACYclovir (VALTREX) 500 MG tablet Take 1 tablet (500 mg total) by mouth daily.   No facility-administered encounter medications on file as of 02/25/2022.     Vitals:   02/25/22 1524  BP: 136/77  Pulse: 76  Resp: 20  Temp: 98.8 F (37.1 C)  SpO2: 98%  Weight: 164 lb 9.6 oz (74.7 kg)  Height: 6' (1.829 m)   Body mass index is 22.32 kg/m.    SIGNIFICANT DIAGNOSTIC EXAMS  TODAY  01-23-22: MRI head: -- Left  frontal intraparenchymal hematoma with adjacent vasogenic edema and small volume left frontal subarachnoid hemorrhage. These findings  are felt posttraumatic. No definite underlying mass is visualized.    LABS REVIEWED TODAY   10-23-21: hgb a1c 6.1 01-13-22: tsh 2.402; drug screen: + fentanyl 01-22-22: protein 5.4; albumin 3.0 02-16-22: wbc 6.5; hgb 11.4; hct 35.6; mcv 995.3 plt 209; glucose 100; bun 17; creat 1.00; k+ 4.1; na++ 144; ca 9.3; ca 7.7    Review of Systems  Constitutional:  Negative for malaise/fatigue.  Respiratory:  Negative for cough and shortness of breath.   Cardiovascular:  Negative for chest pain, palpitations and leg swelling.  Gastrointestinal:  Negative for abdominal pain, constipation and heartburn.  Musculoskeletal:  Negative for back pain, joint pain and myalgias.  Skin: Negative.   Neurological:  Negative for dizziness.  Psychiatric/Behavioral:  The patient is not nervous/anxious.    Physical Exam Constitutional:      General: He is not in acute distress.    Appearance: He is well-developed. He is not diaphoretic.  Neck:     Thyroid: No thyromegaly.  Cardiovascular:     Rate and Rhythm: Normal rate and regular rhythm.     Pulses: Normal pulses.     Heart sounds: Normal heart sounds.  Pulmonary:     Effort: Pulmonary effort is normal. No respiratory distress.     Breath sounds: Normal breath sounds.  Abdominal:     General: Bowel sounds are normal. There is no distension.     Palpations: Abdomen is soft.     Tenderness: There is no abdominal tenderness.  Musculoskeletal:        General: Normal range of motion.     Cervical back: Neck supple.     Right lower leg: No edema.     Left lower leg: No edema.  Lymphadenopathy:     Cervical: No cervical adenopathy.  Skin:    General: Skin is warm and dry.  Neurological:     Mental Status: He is alert and oriented to person, place, and time.  Psychiatric:        Mood and Affect: Mood normal.        Assessment/Plan:    Patient is being discharged with the following home health services:  pt/ot to evaluate and treat as indicated for gait balance strength adl training.   Patient is being discharged with the following durable medical equipment:  wheelchair   Patient has been advised to f/u with their PCP in 1-2 weeks to for a transitions of care visit.  Social services at their facility was responsible for arranging this appointment.  Pt was provided with adequate prescriptions of noncontrolled medications to reach the scheduled appointment .  For controlled substances, a limited supply was provided as appropriate for the individual patient.  If the pt normally receives these medications from a pain clinic or has a contract with another physician, these medications should be received from that clinic or physician only).    A 30 day supply of his prescription medications have been sent to [facility] in [place]    Time spent with patient:40 minutes:  home health; medications; dme    Ok Edwards NP [facility]   call [phone]

## 2022-05-03 ENCOUNTER — Other Ambulatory Visit: Payer: Self-pay | Admitting: Adult Health

## 2022-05-10 ENCOUNTER — Other Ambulatory Visit: Payer: Self-pay | Admitting: Adult Health

## 2022-05-29 ENCOUNTER — Other Ambulatory Visit: Payer: Self-pay | Admitting: Adult Health

## 2022-06-13 ENCOUNTER — Other Ambulatory Visit: Payer: Self-pay | Admitting: Adult Health

## 2022-06-18 ENCOUNTER — Other Ambulatory Visit: Payer: Self-pay | Admitting: Adult Health

## 2022-07-04 ENCOUNTER — Other Ambulatory Visit: Payer: Self-pay | Admitting: Adult Health

## 2022-07-31 ENCOUNTER — Ambulatory Visit: Payer: Medicare Other | Admitting: Urology

## 2022-08-03 ENCOUNTER — Other Ambulatory Visit: Payer: Self-pay

## 2022-08-03 ENCOUNTER — Other Ambulatory Visit: Payer: Medicare Other

## 2022-08-03 DIAGNOSIS — N401 Enlarged prostate with lower urinary tract symptoms: Secondary | ICD-10-CM

## 2022-08-04 ENCOUNTER — Other Ambulatory Visit: Payer: Medicare Other

## 2022-08-04 DIAGNOSIS — N401 Enlarged prostate with lower urinary tract symptoms: Secondary | ICD-10-CM

## 2022-08-05 ENCOUNTER — Other Ambulatory Visit: Payer: Self-pay | Admitting: Adult Health

## 2022-08-05 LAB — PSA: Prostate Specific Ag, Serum: 0.7 ng/mL (ref 0.0–4.0)

## 2022-08-10 ENCOUNTER — Ambulatory Visit (INDEPENDENT_AMBULATORY_CARE_PROVIDER_SITE_OTHER): Payer: Medicare Other | Admitting: Urology

## 2022-08-10 ENCOUNTER — Encounter: Payer: Self-pay | Admitting: Urology

## 2022-08-10 VITALS — BP 106/68 | HR 91 | Ht 70.0 in | Wt 163.0 lb

## 2022-08-10 DIAGNOSIS — N4231 Prostatic intraepithelial neoplasia: Secondary | ICD-10-CM

## 2022-08-10 DIAGNOSIS — Z86002 Personal history of in-situ neoplasm of other and unspecified genital organs: Secondary | ICD-10-CM | POA: Diagnosis not present

## 2022-08-10 DIAGNOSIS — N401 Enlarged prostate with lower urinary tract symptoms: Secondary | ICD-10-CM

## 2022-08-10 DIAGNOSIS — N4232 Atypical small acinar proliferation of prostate: Secondary | ICD-10-CM

## 2022-08-10 MED ORDER — TAMSULOSIN HCL 0.4 MG PO CAPS: 0.4000 mg | ORAL_CAPSULE | Freq: Every day | ORAL | 3 refills | Status: DC

## 2022-08-10 MED ORDER — FINASTERIDE 5 MG PO TABS: 5.0000 mg | ORAL_TABLET | Freq: Every day | ORAL | 3 refills | Status: DC

## 2022-08-10 NOTE — Progress Notes (Signed)
08/10/2022 9:36 AM   Timothy Turner 06-Aug-1940 [id number]  Referring provider: Leonel Ramsay, MD [place],  [place] [address]  Chief Complaint  Patient presents with   Other    Urologic history: 1.  Elevated PSA  -prostate biopsy Dr. 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   Started on finasteride, PSA has remained stable.   -was not rebiopsied   2.  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 male presents for annual follow-up.   [place] off a ladder August 2023 suffering traumatic brain injury, hip and rib fractures No bothersome LUTS Denies dysuria, gross hematuria Denies flank, abdominal or pelvic pain Remains on finasteride/tamsulosin PSA 08/04/2022 stable 0.7(uncorrected)    PMH: Past Medical History:  Diagnosis Date   Aspiration precautions    uses thicken in liquids.  Eats pureed food.   Barrett esophagus    Depression    Gallstones    GERD (gastroesophageal reflux disease)    Heart murmur    History of   Hemorrhoids    Hypertension    Hypothyroidism    Iron deficiency anemia 02/10/2012   Mitral valve disease    Myalgia    Multiple   Neuromuscular disorder (HCC)    PAD (peripheral artery disease) (HCC)    lower legs   Pain syndrome, chronic    Severe   Pneumonia    three times, last time >10 years ago   Psoriasis    Pulmonary embolism ([place]) 2019   left lung   Rheumatic heart valve incompetence    Shortness of breath dyspnea    Thyroiditis     Surgical History: Past Surgical History:  Procedure Laterality Date   BACK SURGERY     CARDIAC CATHETERIZATION N/A 02/22/2015   Procedure: Left Heart Cath and Coronary Angiography;  Surgeon: Minna Merritts, MD;  Location: [facility];  Service: Cardiovascular;  Laterality: N/A;   CATARACT EXTRACTION W/PHACO Left 07/29/2016   Procedure: CATARACT EXTRACTION PHACO AND  INTRAOCULAR LENS PLACEMENT (IOC)  Left eye IVA Topical;  Surgeon: Leandrew Koyanagi, MD;  Location: [facility];  Service: Ophthalmology;  Laterality: Left;   CATARACT EXTRACTION W/PHACO Right 08/19/2016   Procedure: CATARACT EXTRACTION PHACO AND INTRAOCULAR LENS PLACEMENT ([place]) right  TORIC;  Surgeon: Leandrew Koyanagi, MD;  Location: [facility];  Service: Ophthalmology;  Laterality: Right;  prefers early morning   CHOLECYSTECTOMY  08/03/13   COLONOSCOPY  03-08-12   Dr Donnella Sham   COLONOSCOPY WITH PROPOFOL N/A 06/17/2017   Procedure: COLONOSCOPY WITH PROPOFOL;  Surgeon: [name], MD;  Location: [facility];  Service: Gastroenterology;  Laterality: N/A;   DIRECT LARYNGOSCOPY N/A 02/17/2016   Procedure: DIRECT LARYNGOSCOPY;  Surgeon: Jodi Marble, MD;  Location: [facility];  Service: ENT;  Laterality: N/A;   ESOPHAGEAL MANOMETRY N/A 02/27/2015   Procedure: ESOPHAGEAL MANOMETRY (EM);  Surgeon: Lollie Sails, MD;  Location: [facility] ENDOSCOPY;  Service: Endoscopy;  Laterality: N/A;   ESOPHAGOGASTRODUODENOSCOPY (EGD) WITH PROPOFOL N/A 02/18/2015   Procedure: ESOPHAGOGASTRODUODENOSCOPY (EGD) WITH PROPOFOL;  Surgeon: Lollie Sails, MD;  Location[facility];  Service: Endoscopy;  Laterality: N/A;   ESOPHAGOGASTRODUODENOSCOPY (EGD) WITH PROPOFOL N/A 06/17/2017   Procedure: ESOPHAGOGASTRODUODENOSCOPY (EGD) WITH PROPOFOL;  Surgeon: [name], MD;  Location: [facility] ENDOSCOPY;  Service: Gastroenterology;  Laterality: N/A;   ESOPHAGOSCOPY W/ BOTOX INJECTION N/A 02/17/2016   Procedure: ESOPHAGOSCOPY WITH BOTOX INJECTION  ;  Surgeon: Jodi Marble, MD;  Location: [facility];  Service: ENT;  Laterality: N/A;   HEMORROIDECTOMY     UPPER GI ENDOSCOPY  03-06-2013   Dr Donnella Sham    Home Medications:  Allergies as of 08/10/2022       Reactions   Duloxetine    Unable to urinate   Nsaids Other (See Comments)   CANNOT TAKE NSAIDS BECAUSE OF ESOPHAGUS  ISSUES CANNOT TAKE NSAIDS BECAUSE OF ESOPHAGUS ISSUES   Rosuvastatin Other (See Comments)   SEVERE MUSCLE ACHES SEVERE MUSCLE ACHES   Statins Other (See Comments)   SEVERE MUSCLE ACHES SEVERE MUSCLE ACHES Other reaction(s): Other (See Comments) SEVERE MUSCLE ACHES   Tolmetin Other (See Comments)   CANNOT TAKE NSAIDS BECAUSE OF ESOPHAGUS ISSUES CANNOT TAKE NSAIDS BECAUSE OF ESOPHAGUS ISSUES CANNOT TAKE NSAIDS BECAUSE OF ESOPHAGUS ISSUES CANNOT TAKE NSAIDS BECAUSE OF ESOPHAGUS ISSUES        Medication List        Accurate as of August 10, 2022  9:36 AM. If you have any questions, ask your nurse or doctor.          apixaban 5 MG Tabs tablet Commonly known as: Eliquis Take 1 tablet (5 mg total) by mouth 2 (two) times daily.   divalproex 250 MG DR tablet Commonly known as: DEPAKOTE Take 1 tablet (250 mg total) by mouth 3 (three) times daily.   donepezil 5 MG tablet Commonly known as: ARICEPT Take 1 tablet (5 mg total) by mouth at bedtime.   finasteride 5 MG tablet Commonly known as: PROSCAR Take 1 tablet (5 mg total) by mouth daily.   levothyroxine 88 MCG tablet Commonly known as: SYNTHROID Take 1 tablet (88 mcg total) by mouth daily before breakfast.   losartan 25 MG tablet Commonly known as: COZAAR Take 1 tablet (25 mg total) by mouth daily.   methocarbamol 500 MG tablet Commonly known as: ROBAXIN Take 1 tablet (500 mg total) by mouth 4 (four) times daily.   mirtazapine 7.5 MG tablet Commonly known as: REMERON Take 1 tablet (7.5 mg total) by mouth at bedtime.   pantoprazole 40 MG tablet Commonly known as: PROTONIX Take 1 tablet (40 mg total) by mouth daily.   pregabalin 25 MG capsule Commonly known as: LYRICA Take 1 capsule (25 mg total) by mouth 2 (two) times daily.   Stiolto Respimat 2.5-2.5 MCG/ACT Aers Generic drug: Tiotropium Bromide-Olodaterol Inhale 2 each into the lungs daily at 2 PM.   tamsulosin 0.4 MG Caps capsule Commonly known as:  FLOMAX Take 1 capsule (0.4 mg total) by mouth daily after supper.   valACYclovir 500 MG tablet Commonly known as: VALTREX TAKE 1 TABLET BY MOUTH DAILY        Allergies:  Allergies  Allergen Reactions   Duloxetine     Unable to urinate   Nsaids Other (See Comments)    CANNOT TAKE NSAIDS BECAUSE OF ESOPHAGUS ISSUES CANNOT TAKE NSAIDS BECAUSE OF ESOPHAGUS ISSUES   Rosuvastatin Other (See Comments)    SEVERE MUSCLE ACHES SEVERE MUSCLE ACHES   Statins Other (See Comments)    SEVERE MUSCLE ACHES SEVERE MUSCLE ACHES Other reaction(s): Other (See Comments) SEVERE MUSCLE ACHES   Tolmetin Other (See Comments)    CANNOT TAKE NSAIDS BECAUSE OF ESOPHAGUS ISSUES CANNOT TAKE NSAIDS BECAUSE OF ESOPHAGUS ISSUES CANNOT TAKE NSAIDS BECAUSE OF ESOPHAGUS ISSUES CANNOT TAKE NSAIDS BECAUSE OF ESOPHAGUS ISSUES    Family History: Family History  Problem Relation Age of Onset   Heart  failure Mother    Heart failure Father    Emphysema Father        smoked   Cancer Sister    Aneurysm Sister        Brain    Social History:  reports that he quit smoking about 18 years ago. His smoking use included cigarettes. He has a 30.00 pack-year smoking history. He has never used smokeless tobacco. He reports that he does not drink alcohol and does not use drugs.   Physical Exam: BP 106/68   Pulse 91   Ht 5' 10"$  (1.778 m)   Wt 163 lb (73.9 kg)   BMI 23.39 kg/m   Constitutional:  Alert and oriented, No acute distress. HEENT: [place] AT, moist mucus membranes.  Trachea midline, no masses. Cardiovascular: No clubbing, cyanosis, or edema. Respiratory: Normal respiratory effort, no increased work of breathing. Psychiatric: Normal mood and affect.    Assessment & Plan:    1.  History elevated PSA with high-grade PIN/atypia Corrected PSA remains normal and stable Continue annual follow-up  2.  BPH with LUTS Stable Finasteride refilled    Abbie Sons, MD  [facility] [address] [place], [place] [address] [phone]

## 2022-09-05 ENCOUNTER — Other Ambulatory Visit: Payer: Self-pay | Admitting: Adult Health

## 2022-09-08 ENCOUNTER — Other Ambulatory Visit: Payer: Self-pay | Admitting: Urology

## 2023-01-21 ENCOUNTER — Ambulatory Visit (INDEPENDENT_AMBULATORY_CARE_PROVIDER_SITE_OTHER): Payer: Medicare Other | Admitting: Dermatology

## 2023-01-21 VITALS — BP 132/75 | HR 77

## 2023-01-21 DIAGNOSIS — L219 Seborrheic dermatitis, unspecified: Secondary | ICD-10-CM

## 2023-01-21 MED ORDER — MOMETASONE FUROATE 0.1 % EX SOLN: Freq: Every day | CUTANEOUS | 2 refills | Status: AC

## 2023-01-21 MED ORDER — SULFACETAMIDE SODIUM 10 % EX SHAM: MEDICATED_SHAMPOO | CUTANEOUS | 2 refills | Status: AC

## 2023-01-21 MED ORDER — DOXYCYCLINE HYCLATE 20 MG PO TABS: ORAL_TABLET | ORAL | 2 refills | Status: DC

## 2023-01-21 NOTE — Patient Instructions (Signed)
Due to recent changes in healthcare laws, you may see results of your pathology and/or laboratory studies on MyChart before the doctors have had a chance to review them. We understand that in some cases there may be results that are confusing or concerning to you. Please understand that not all results are received at the same time and often the doctors may need to interpret multiple results in order to provide you with the best plan of care or course of treatment. Therefore, we ask that you please give [place] 2 business days to thoroughly review all your results before contacting the office for clarification. Should we see a critical lab result, you will be contacted sooner.   If You Need Anything After Your Visit  If you have any questions or concerns for your doctor, please call our main line at [phone] and press option 4 to reach your doctor's medical assistant. If no one answers, please leave a voicemail as directed and we will return your call as soon as possible. Messages left after 4 pm will be answered the following business day.   You may also send [place] a message via MyChart. We typically respond to MyChart messages within 1-2 business days.  For prescription refills, please ask your pharmacy to contact our office. Our fax number is [phone].  If you have an urgent issue when the clinic is closed that cannot wait until the next business day, you can page your doctor at the number below.    Please note that while we do our best to be available for urgent issues outside of office hours, we are not available 24/7.   If you have an urgent issue and are unable to reach [place], you may choose to seek medical care at your doctor's office, retail clinic, urgent care center, or emergency room.  If you have a medical emergency, please immediately call 911 or go to the emergency department.  Pager Numbers  - Dr. Gwen Pounds: [phone]  - Dr. Roseanne Reno: [phone]  In the event of inclement  weather, please call our main line at [phone] for an update on the status of any delays or closures.  Dermatology Medication Tips: Please keep the boxes that topical medications come in in order to help keep track of the instructions about where and how to use these. Pharmacies typically print the medication instructions only on the boxes and not directly on the medication tubes.   If your medication is too expensive, please contact our office at [phone] option 4 or send [place] a message through MyChart.   We are unable to tell what your co-pay for medications will be in advance as this is different depending on your insurance coverage. However, we may be able to find a substitute medication at lower cost or fill out paperwork to get insurance to cover a needed medication.   If a prior authorization is required to get your medication covered by your insurance company, please allow [place] 1-2 business days to complete this process.  Drug prices often vary depending on where the prescription is filled and some pharmacies may offer cheaper prices.  The website www.goodrx.com contains coupons for medications through different pharmacies. The prices here do not account for what the cost may be with help from insurance (it may be cheaper with your insurance), but the website can give you the price if you did not use any insurance.  - You can print the associated coupon and take it with your prescription to the pharmacy.  -  You may also stop by our office during regular business hours and pick up a GoodRx coupon card.  - If you need your prescription sent electronically to a different pharmacy, notify our office through [facility] or by phone at [phone] option 4.     Si Usted Necesita Algo Despus de Su Visita  Tambin puede enviarnos un mensaje a travs de Clinical cytogeneticist. Por lo general respondemos a los mensajes de MyChart en el transcurso de 1 a 2 das hbiles.  Para renovar recetas,  por favor pida a su farmacia que se ponga en contacto con nuestra oficina. Annie Sable de fax es [place] [phone].  Si tiene un asunto urgente cuando la clnica est cerrada y que no puede esperar hasta el siguiente da hbil, puede llamar/localizar a su doctor(a) al nmero que aparece a continuacin.   Por favor, tenga en cuenta que aunque hacemos todo lo posible para estar disponibles para asuntos urgentes fuera del horario de [place], no estamos disponibles las 24 horas del da, los 7 [address]  [address] de la [place].   Si tiene un problema urgente y no puede comunicarse con nosotros, puede optar por buscar atencin mdica  en el consultorio de su doctor(a), en una clnica privada, en un centro de atencin urgente o en una sala de emergencias.  Si tiene Engineer, drilling, por favor llame inmediatamente al 911 o vaya a la sala de emergencias.  Nmeros de bper  - Dr. Gwen Pounds: [phone]  - Dra. Roseanne Reno: [phone]  En caso de inclemencias del [place], por favor llame a Lacy Duverney principal al [phone] para una actualizacin sobre el [place] de cualquier retraso o cierre.  Consejos para la medicacin en dermatologa: Por favor, guarde las cajas en las que vienen los medicamentos de uso tpico para ayudarle a seguir las instrucciones sobre dnde y cmo usarlos. Las farmacias generalmente imprimen las instrucciones del medicamento slo en las cajas y no directamente en los tubos del [place].   Si su medicamento es muy caro, por favor, pngase en contacto con Rolm Gala llamando al [phone] y presione la opcin 4 o envenos un mensaje a travs de Clinical cytogeneticist.   No podemos decirle cul ser su copago por los medicamentos por adelantado ya que esto es diferente dependiendo de la cobertura de su seguro. Sin embargo, es posible que podamos encontrar un medicamento sustituto a Audiological scientist un formulario para que el seguro cubra el medicamento que se considera necesario.   Si se  requiere una autorizacin previa para que su compaa de seguros [place] su medicamento, por favor permtanos de 1 a 2 das hbiles para completar [address]  Los precios de los medicamentos varan con frecuencia dependiendo del Environmental consultant de dnde se surte la receta y alguna farmacias pueden ofrecer precios ms baratos.  El sitio web www.goodrx.com tiene cupones para medicamentos de Health and safety inspector. Los precios aqu no tienen en cuenta lo que podra costar con la ayuda del seguro (puede ser ms barato con su seguro), pero el sitio web puede darle el precio si no utiliz Tourist information centre manager.  - Puede imprimir el cupn correspondiente y llevarlo con su receta a la farmacia.  - Tambin puede pasar por nuestra oficina durante el horario de atencin regular y Education officer, museum una tarjeta de cupones de GoodRx.  - Si necesita que su receta se enve electrnicamente a una farmacia diferente, informe a nuestra oficina a travs de MyChart de [place] o por telfono llamando al [phone] y presione la opcin 4.

## 2023-01-21 NOTE — Progress Notes (Signed)
   Follow-Up Visit   Subjective  Timothy Turner is a 82 y.o. male who presents for the following: Very itchy scalp and bumps of the scalp. Patient states that Ciclopirox shampoo caused hair loss so he stopped using it, he was also in the hospital for many months due to falling from a ladder and experiencing TBI. He has tried numerous OTC products including olive oil but nothing seems to help. Prescriptions patient has tried include Fluocinonide solution, Clobetasol shampoo, Ketoconazole cream, Zoryve cream samples, Ketoconazole 2% shampoo, Clobetasol solution, and Otezla 30 mg po BID, but none of those helped to improve condition either.  The patient has spots, moles and lesions to be evaluated, some may be new or changing and the patient may have concern these could be cancer.   The following portions of the chart were reviewed this encounter and updated as appropriate: medications, allergies, medical history  Review of Systems:  No other skin or systemic complaints except as noted in HPI or Assessment and Plan.  Objective  Well appearing patient in no apparent distress; mood and affect are within normal limits.   A focused examination was performed of the following areas:   Relevant exam findings are noted in the Assessment and Plan.    Assessment & Plan   SEBORRHEIC DERMATITIS with component of neuralgia  Exam: Mild erythema with crusting at the L occipital scalp. Mild scaling of the scalp.  Symptoms are out of proportion to physical exam  Chronic and persistent condition with duration or expected duration over one year. Condition is symptomatic/ bothersome to patient. Not currently at goal.  Seborrheic Dermatitis is a chronic persistent rash characterized by pinkness and scaling most commonly of the mid face but also can occur on the scalp (dandruff), ears; mid chest, mid back and groin.  It tends to be exacerbated by stress and cooler weather.  People who have neurologic  disease may experience new onset or exacerbation of existing seborrheic dermatitis.  The condition is not curable but treatable and can be controlled.  Treatment Plan: Start Doxycycline 20 mg 2 po QD. Doxycycline should be taken with food to prevent nausea. Do not lay down for 30 minutes after taking. Be cautious with sun exposure and use good sun protection while on this medication. Pregnant women should not take this medication.   Start Sulfacetamide 10% shampoo massage into scalp let sit 5 minutes then wash out.   Patient defers Fluocinolone oil today, because he doesn't want to wear a night cap, and have the oil get all over his bedding.   Start Mometasone solution to aa's scalp QD-BID PRN. Topical steroids (such as triamcinolone, fluocinolone, fluocinonide, mometasone, clobetasol, halobetasol, betamethasone, hydrocortisone) can cause thinning and lightening of the skin if they are used for too long in the same area. Your physician has selected the right strength medicine for your problem and area affected on the body. Please use your medication only as directed by your physician to prevent side effects.   Return in about 3 months (around 04/23/2023) for seb derm follow up.  Maylene Roes, CMA, am acting as scribe for Willeen Niece, MD .   Documentation: I have reviewed the above documentation for accuracy and completeness, and I agree with the above.  Willeen Niece, MD

## 2023-04-04 NOTE — Progress Notes (Unsigned)
Cardiology Office Note  Date:  04/06/2023   ID:  Timothy Turner, DOB 01-Jan-1941, MRN [id number]  PCP:  Mick Sell, MD   Cc: preop hip replacement at [facility]   HPI:  Timothy Turner is a  82 year-old gentleman with past medical history of smoking history, PAD moderate bilateral iliac arterial disease in 2015, done at [facility] mitral valve prolapse, mild MR hyperlipidemia,  Long prior smoking history for at least 30 years, stopped when he was in his late 90s hypertension with diffuse chronic muscle aches of uncertain etiology Cardiac catheterization September 2016 No significant coronary disease  DVT, PE 10/2017 EF 60% in Jan 2020, ejection fraction 55 to 60% in January 2023 who presents for routine followup of his PAD, MR.  LOV 1/23 Events from last year reviewed Fall off a ladder July 2023, injury to head and hip Left frontal SAH with adjacent contusion - Trace SDH  - L maxillary sinus fxs, minimally displaced - L ant/lat/posterior orbital floor fx - L acetabulum fx, L inf pubic rami fx - L pelvic hematoma, possible external left iliac vein injury and concern for extravasation within the proximal left lateral hip musculature  - Facial laceration   "In a coma for 2 weeks" "Still some thing I am slow on"  Needs left hip replacement Scheduled nov 7th at [facility], Dr. 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 Has muscle ache off benzos Clonazepam 1x a day  Prior cardiac imaging reviewed Echo July 03, 2021 EF 55 to 60% grade 2 diastolic dysfunction Moderately dilated left atrium, mild to moderate MR  Lab work reviewed A1c 5.4 Total cholesterol 158 LDL 98  EKG personally reviewed by myself on todays visit EKG Interpretation Date/Time:  Tuesday April 06 2023 08:33:30 EDT Ventricular Rate:  90 PR Interval:  256 QRS Duration:  98 QT Interval:  350 QTC Calculation: 428 R Axis:   31  Text Interpretation: Sinus rhythm with  1st degree A-V block with frequent Premature ventricular complexes T wave abnormality, consider inferior ischemia When compared with ECG of 11-Nov-2017 16:27, Premature ventricular complexes are now Present Non-specific change in ST segment in Lateral leads T wave inversion now evident in Inferior leads T wave inversion now evident in Lateral leads Confirmed by Julien Nordmann ([id number]) on 04/06/2023 8:34:48 AM   2020 1. Right rib fractures of 8 through 11 with up to mild displacement.  Was on PCSK9 inh, caused side effects, muscle ache,  stopped the medication  Stopped zetia secondary to cost   lower extremity arterial Dopplers with him from November 2019 Lower extremity venous Doppler results from May 2019  showing occlusion of popliteal vessel Moderate iliac disease bilaterally    Fall, subsequent knee trauma, April 2019 Was very sedentary for 1 month secondary to recovering from the trauma Developed DVT and pulmonary embolism, to the emergency room with acute shortness of breath May 2019 Left PE on CT scan 11/11/2017 Started on eliquis  Long history of muscle cramping in his legs improved on clonazepam started years ago Unable to tolerate statins secondary to muscle pain  Cardiac catheterization September 2016 No significant coronary disease to explain shortness of breath, ejection fraction 55% Suspected to have COPD and PFTs were recommended   PMH:   has a past medical history of Aspiration precautions, Barrett esophagus, Depression, Gallstones, GERD (gastroesophageal reflux disease), Heart murmur, Hemorrhoids, Hypertension, Hypothyroidism, Iron deficiency anemia (02/10/2012), Mitral valve disease, Myalgia, Neuromuscular disorder (HCC), PAD (peripheral artery  disease) (HCC), Pain syndrome, chronic, Pneumonia, Psoriasis, Pulmonary embolism (HCC) (2019), Rheumatic heart valve incompetence, Shortness of breath dyspnea, and Thyroiditis.  PSH:    Past Surgical History:  Procedure Laterality  Date   BACK SURGERY     CARDIAC CATHETERIZATION N/A 02/22/2015   Procedure: Left Heart Cath and Coronary Angiography;  Surgeon: Antonieta Iba, MD;  Location: [facility] INVASIVE CV LAB;  Service: Cardiovascular;  Laterality: N/A;   CATARACT EXTRACTION W/PHACO Left 07/29/2016   Procedure: CATARACT EXTRACTION PHACO AND INTRAOCULAR LENS PLACEMENT (IOC)  Left eye IVA Topical;  Surgeon: Lockie Mola, MD;  Location: [facility];  Service: Ophthalmology;  Laterality: Left;   CATARACT EXTRACTION W/PHACO Right 08/19/2016   Procedure: CATARACT EXTRACTION PHACO AND INTRAOCULAR LENS PLACEMENT (IOC) right  TORIC;  Surgeon: Lockie Mola, MD;  Location: [facility];  Service: Ophthalmology;  Laterality: Right;  prefers early morning   CHOLECYSTECTOMY  08/03/13   COLONOSCOPY  03-08-12   Dr Ricki Rodriguez   COLONOSCOPY WITH PROPOFOL N/A 06/17/2017   Procedure: COLONOSCOPY WITH PROPOFOL;  Surgeon: Wyline Mood, MD;  Location: [facility] ENDOSCOPY;  Service: Gastroenterology;  Laterality: N/A;   DIRECT LARYNGOSCOPY N/A 02/17/2016   Procedure: DIRECT LARYNGOSCOPY;  Surgeon: Flo Shanks, MD;  Location: [facility];  Service: ENT;  Laterality: N/A;   ESOPHAGEAL MANOMETRY N/A 02/27/2015   Procedure: ESOPHAGEAL MANOMETRY (EM);  Surgeon: Christena Deem, MD;  Location: [facility] ENDOSCOPY;  Service: Endoscopy;  Laterality: N/A;   ESOPHAGOGASTRODUODENOSCOPY (EGD) WITH PROPOFOL N/A 02/18/2015   Procedure: ESOPHAGOGASTRODUODENOSCOPY (EGD) WITH PROPOFOL;  Surgeon: Christena Deem, MD;  Location: [facility] ENDOSCOPY;  Service: Endoscopy;  Laterality: N/A;   ESOPHAGOGASTRODUODENOSCOPY (EGD) WITH PROPOFOL N/A 06/17/2017   Procedure: ESOPHAGOGASTRODUODENOSCOPY (EGD) WITH PROPOFOL;  Surgeon: Wyline Mood, MD;  Location: [facility] ENDOSCOPY;  Service: Gastroenterology;  Laterality: N/A;   ESOPHAGOSCOPY W/ BOTOX INJECTION N/A 02/17/2016   Procedure: ESOPHAGOSCOPY WITH BOTOX INJECTION  ;  Surgeon: Flo Shanks, MD;   Location: [facility];  Service: ENT;  Laterality: N/A;   HEMORROIDECTOMY     UPPER GI ENDOSCOPY  03-06-2013   Dr Ricki Rodriguez    Current Outpatient Medications  Medication Sig Dispense Refill   Carboxymethylcellulose Sod PF (THERATEARS PF) 0.25 % SOLN Apply to eye as needed (dry eyes).     clonazePAM (KLONOPIN) 2 MG tablet Take 2 mg by mouth 3 (three) times daily as needed.     donepezil (ARICEPT) 5 MG tablet Take 1 tablet (5 mg total) by mouth at bedtime. 30 tablet 0   doxycycline (PERIOSTAT) 20 MG tablet Take 2 tabs po QD with food. 60 tablet 2   esomeprazole (NEXIUM) 40 MG capsule Take 40 mg by mouth daily.     finasteride (PROSCAR) 5 MG tablet Take 1 tablet (5 mg total) by mouth daily. 90 tablet 3   ipratropium (ATROVENT) 0.06 % nasal spray Place into the nose as needed for rhinitis.     ketoconazole (NIZORAL) 2 % cream daily.     levothyroxine (SYNTHROID) 88 MCG tablet Take 1 tablet (88 mcg total) by mouth daily before breakfast. 30 tablet 0   lidocaine (LIDODERM) 5 % 1 patch daily.     losartan (COZAAR) 25 MG tablet Take 1 tablet (25 mg total) by mouth daily. 30 tablet 0   mirtazapine (REMERON) 7.5 MG tablet Take 1 tablet (7.5 mg total) by mouth at bedtime. 30 tablet 0   mometasone (ELOCON) 0.1 % lotion Apply topically daily. Apply to aa's scalp QD-BID PRN. 60  mL 2   pantoprazole (PROTONIX) 40 MG tablet Take 1 tablet (40 mg total) by mouth daily. 30 tablet 0   Sulfacetamide Sodium 10 % SHAM Shampoo into the scalp let sit 5 minutes then wash out. Use QD. 237 mL 2   tamsulosin (FLOMAX) 0.4 MG CAPS capsule Take 1 capsule (0.4 mg total) by mouth daily after supper. 90 capsule 3   Tiotropium Bromide-Olodaterol (STIOLTO RESPIMAT) 2.5-2.5 MCG/ACT AERS Inhale 2 each into the lungs daily at 2 PM. 4 g 0   valACYclovir (VALTREX) 500 MG tablet TAKE 1 TABLET BY MOUTH DAILY 30 tablet 0   No current facility-administered medications for this visit.     Allergies:   Duloxetine, Nsaids,  Rosuvastatin, Statins, and Tolmetin   Social History:  The patient  reports that he quit smoking about 18 years ago. His smoking use included cigarettes. He started smoking about 48 years ago. He has a 30 pack-year smoking history. He has never used smokeless tobacco. He reports that he does not drink alcohol and does not use drugs.   Family History:   family history includes Aneurysm in his sister; Cancer in his sister; Emphysema in his father; Heart failure in his father and mother.    Review of Systems: Review of Systems  Constitutional: Negative.   HENT: Negative.    Respiratory:  Positive for shortness of breath.   Cardiovascular: Negative.   Gastrointestinal: Negative.   Neurological: Negative.   Psychiatric/Behavioral: Negative.    All other systems reviewed and are negative.    PHYSICAL EXAM: VS:  BP 120/68   Pulse 90   Ht 6' (1.829 m)   Wt 164 lb (74.4 kg)   SpO2 98%   BMI 22.24 kg/m  , BMI Body mass index is 22.24 kg/m. Constitutional:  oriented to person, place, and time. No distress.  HENT:  Head: Grossly normal Eyes:  no discharge. No scleral icterus.  Neck: No JVD, 1+ bilateral carotid bruits  Cardiovascular: Regular rate and rhythm, 1/6 systolic ejection murmur right sternal border Pulmonary/Chest: Clear to auscultation bilaterally, no wheezes  Scattered rales at bases bilaterally Abdominal: Soft.  no distension.  no tenderness.  Musculoskeletal: Normal range of motion Neurological:  normal muscle tone. Coordination normal. No atrophy Skin: Skin warm and dry Psychiatric: normal affect, pleasant  Recent Labs: No results found for requested labs within last 365 days.    Lipid Panel Lab Results  Component Value Date   CHOL 168 01/11/2019   HDL 34.70 (L) 01/11/2019   LDLCALC 109 (H) 01/11/2019   TRIG 120.0 01/11/2019    Wt Readings from Last 3 Encounters:  04/06/23 164 lb (74.4 kg)  08/10/22 163 lb (73.9 kg)  02/25/22 164 lb 9.6 oz (74.7 kg)      ASSESSMENT AND PLAN:  Chronic shortness of breath Multifactorial including deconditioned, long smoking history/COPD He does not want inhalers Consider ischemic workup  History of pulmonary embolism with acute cor pulmonale, unspecified pulmonary embolism type (HCC) History of provoked DVT lower extremity, pulmonary embolism on the left documented on CT scan Echocardiogram with right heart strain,  November 2019 Repeat echo 1 year later had normalized Not on anticoagulation  PVD (peripheral vascular disease) (HCC) Prior history of moderate iliac disease ABI normal range several years ago, no claudication Previously declined any further studies Lipids above goal  Chronic obstructive pulmonary disease, unspecified COPD type (HCC) Recommend regular walking program for conditioning Echo performed for shortness of breath symptoms showing normal LV function Previously declined  inhalers  Chronic kidney disease (CKD), stage III (moderate) (HCC) Creatinine 1.1 up to 1.4 Managed by primary care  Hyperlipidemia Declined Zetia and statins given statin myalgias Previously declined PCSK9 inhibitor Does not want any medications for cholesterol   Orders Placed This Encounter  Procedures   EKG 12-Lead     Signed, Dossie Arbour, M.D., Ph.D. 04/06/2023  [facility] [place], [place] [phone] Ms. Knute Neu was 1 but there were a couple I changed a couple on her and couple on other people

## 2023-04-06 ENCOUNTER — Encounter: Payer: Self-pay | Admitting: Cardiovascular Disease

## 2023-04-06 ENCOUNTER — Ambulatory Visit: Payer: Medicare Other | Attending: Cardiovascular Disease | Admitting: Cardiovascular Disease

## 2023-04-06 VITALS — BP 120/68 | HR 90 | Ht 72.0 in | Wt 164.0 lb

## 2023-04-06 DIAGNOSIS — T466X5A Adverse effect of antihyperlipidemic and antiarteriosclerotic drugs, initial encounter: Secondary | ICD-10-CM

## 2023-04-06 DIAGNOSIS — I359 Nonrheumatic aortic valve disorder, unspecified: Secondary | ICD-10-CM

## 2023-04-06 DIAGNOSIS — I2609 Other pulmonary embolism with acute cor pulmonale: Secondary | ICD-10-CM

## 2023-04-06 DIAGNOSIS — I340 Nonrheumatic mitral (valve) insufficiency: Secondary | ICD-10-CM

## 2023-04-06 DIAGNOSIS — J449 Chronic obstructive pulmonary disease, unspecified: Secondary | ICD-10-CM

## 2023-04-06 DIAGNOSIS — N183 Chronic kidney disease, stage 3 unspecified: Secondary | ICD-10-CM

## 2023-04-06 DIAGNOSIS — T466X5S Adverse effect of antihyperlipidemic and antiarteriosclerotic drugs, sequela: Secondary | ICD-10-CM

## 2023-04-06 DIAGNOSIS — I739 Peripheral vascular disease, unspecified: Secondary | ICD-10-CM

## 2023-04-06 DIAGNOSIS — M791 Myalgia, unspecified site: Secondary | ICD-10-CM

## 2023-04-06 DIAGNOSIS — R0602 Shortness of breath: Secondary | ICD-10-CM

## 2023-04-06 NOTE — Patient Instructions (Signed)

## 2023-04-23 ENCOUNTER — Telehealth: Payer: Self-pay | Admitting: Cardiovascular Disease

## 2023-04-23 NOTE — Telephone Encounter (Signed)
   Name: Timothy Turner  DOB: 1941-02-19  MRN: [id number]   Primary Cardiologist: Julien Nordmann, MD  Chart reviewed as part of pre-operative protocol coverage. Timothy Turner was last seen on 04/06/2023 by Dr. Mariah Milling.  Per Dr. Mariah Milling, "Acceptable risk for hip surgery. No further cardiac testing needed at this time."  Therefore, based on ACC/AHA guidelines, the patient would be at acceptable risk for the planned procedure without further cardiovascular testing.    I will route this recommendation to the requesting party via Epic fax function and remove from pre-op pool. Please call with questions.  Carlos Levering, NP 04/23/2023, 12:42 PM

## 2023-04-23 NOTE — Telephone Encounter (Signed)
   Pre-operative Risk Assessment    Patient Name: Timothy Turner  DOB: Oct 30, 1940 MRN: [id number]      Request for Surgical Clearance    Procedure:   Left Hip Arthroplasty  Date of Surgery:  Clearance 04/29/23                                 Surgeon:  Dr. Hartley Barefoot Group or Practice Name:  [facility] Phone number:  [phone] Fax number:  [phone]   Type of Clearance Requested:   - Medical    Type of Anesthesia:  Not Indicated   Additional requests/questions:    SignedShawna Orleans   04/23/2023, 10:39 AM

## 2023-04-23 NOTE — Telephone Encounter (Signed)
Dr. Mariah Milling,  You saw this patient on 04/06/2023. In your note you mention considering ischemic evaluation secondary to chronic dyspnea. Per office protocol, will you please comment on medical clearance for left hip arthroplasty scheduled for 04/29/2023?  Please route your response to P CV DIV Preop. I will communicate with requesting office once you have given recommendations.   Thank you!  Carlos Levering, NP

## 2023-05-03 ENCOUNTER — Ambulatory Visit: Payer: Medicare Other | Admitting: Dermatology

## 2023-07-23 ENCOUNTER — Other Ambulatory Visit: Payer: Self-pay | Admitting: Urology

## 2023-08-06 ENCOUNTER — Other Ambulatory Visit: Payer: Medicare Other

## 2023-08-06 DIAGNOSIS — N401 Enlarged prostate with lower urinary tract symptoms: Secondary | ICD-10-CM

## 2023-08-07 LAB — PSA: Prostate Specific Ag, Serum: 0.7 ng/mL (ref 0.0–4.0)

## 2023-08-11 ENCOUNTER — Ambulatory Visit: Payer: Self-pay | Admitting: Urology

## 2023-08-22 ENCOUNTER — Other Ambulatory Visit: Payer: Self-pay | Admitting: Urology

## 2023-09-15 ENCOUNTER — Ambulatory Visit (INDEPENDENT_AMBULATORY_CARE_PROVIDER_SITE_OTHER): Payer: Medicare Other | Admitting: Urology

## 2023-09-15 ENCOUNTER — Encounter: Payer: Self-pay | Admitting: Urology

## 2023-09-15 VITALS — BP 140/80 | HR 74 | Ht 70.0 in | Wt 155.0 lb

## 2023-09-15 DIAGNOSIS — N4231 Prostatic intraepithelial neoplasia: Secondary | ICD-10-CM

## 2023-09-15 DIAGNOSIS — N401 Enlarged prostate with lower urinary tract symptoms: Secondary | ICD-10-CM

## 2023-09-15 NOTE — Patient Instructions (Signed)
Discontinue Tamsulosin.

## 2023-09-15 NOTE — Progress Notes (Signed)
 I, Maysun Anabel Bene, acting as a scribe for Riki Altes, MD., have documented all relevant documentation on the behalf of Riki Altes, MD, as directed by Riki Altes, MD while in the presence of Riki Altes, MD.  09/15/2023 1:28 PM   Earnest Conroy 14-Jan-1941 [id number]  Referring provider: Mick Sell, MD [address] [place],  [place] [address]  Chief Complaint  Patient presents with   Benign Prostatic Hypertrophy   Urologic history: 1.  Elevated PSA  prostate biopsy Dr. Achilles Dunk 2011, PSA 3.9 w/ prostate nodule -pathology high-grade PIN and focus of atypia.   Started on finasteride, PSA has remained stable.   was not rebiopsied   2.  Hypogonadism Previously on topical testosterone; discontinued by PCP 2020  HPI: Timothy Turner is a 83 y.o. male presents for annual follow-up.  Since last visit, he has had hip replacement surgery.  No bothersome LUTS Denies dysuria, gross hematuria Denies flank, abdominal or pelvic pain Remains on finasteride/tamsulosin and is inquiring if he could stop one of these medications.  PSA 08/06/23 was 0.7 Has had decreased appetite and will see Dr. Sampson Goon next week.  PSA trend  Prostate Specific Ag, Serum  Latest Ref Rng 0.0 - 4.0 ng/mL  01/20/2019 1.0   07/13/2019 0.9   07/15/2020 1.4   07/29/2021 1.3   08/04/2022 0.7   08/06/2023 0.7      PMH: Past Medical History:  Diagnosis Date   Aspiration precautions    uses thicken in liquids.  Eats pureed food.   Barrett esophagus    Depression    Gallstones    GERD (gastroesophageal reflux disease)    Heart murmur    History of   Hemorrhoids    Hypertension    Hypothyroidism    Iron deficiency anemia 02/10/2012   Mitral valve disease    Myalgia    Multiple   Neuromuscular disorder (HCC)    PAD (peripheral artery disease) (HCC)    lower legs   Pain syndrome, chronic    Severe   Pneumonia    three times, last time >10 years ago   Psoriasis     Pulmonary embolism (HCC) 2019   left lung   Rheumatic heart valve incompetence    Shortness of breath dyspnea    Thyroiditis     Surgical History: Past Surgical History:  Procedure Laterality Date   BACK SURGERY     CARDIAC CATHETERIZATION N/A 02/22/2015   Procedure: Left Heart Cath and Coronary Angiography;  Surgeon: Antonieta Iba, MD;  Location: [facility] INVASIVE CV LAB;  Service: Cardiovascular;  Laterality: N/A;   CATARACT EXTRACTION W/PHACO Left 07/29/2016   Procedure: CATARACT EXTRACTION PHACO AND INTRAOCULAR LENS PLACEMENT (IOC)  Left eye IVA Topical;  Surgeon: Lockie Mola, MD;  Location: [facility];  Service: Ophthalmology;  Laterality: Left;   CATARACT EXTRACTION W/PHACO Right 08/19/2016   Procedure: CATARACT EXTRACTION PHACO AND INTRAOCULAR LENS PLACEMENT (IOC) right  TORIC;  Surgeon: Lockie Mola, MD;  Location: [facility];  Service: Ophthalmology;  Laterality: Right;  prefers early morning   CHOLECYSTECTOMY  08/03/13   COLONOSCOPY  03-08-12   Dr Ricki Rodriguez   COLONOSCOPY WITH PROPOFOL N/A 06/17/2017   Procedure: COLONOSCOPY WITH PROPOFOL;  Surgeon: Wyline Mood, MD;  Location: [facility] ENDOSCOPY;  Service: Gastroenterology;  Laterality: N/A;   DIRECT LARYNGOSCOPY N/A 02/17/2016   Procedure: DIRECT LARYNGOSCOPY;  Surgeon: Flo Shanks, MD;  Location: [facility];  Service: ENT;  Laterality: N/A;   ESOPHAGEAL MANOMETRY N/A 02/27/2015   Procedure: ESOPHAGEAL MANOMETRY (EM);  Surgeon: Christena Deem, MD;  Location: [facility] END[facility]COPY;  Service: Endoscopy;  Laterality: N/A;   ESOPHAGOGASTRODUODENOSCOPY (EGD) WITH PROPOFOL N/A 02/18/2015   Procedure: ESOPHAGOGASTRODUODENOSCOPY (EGD) WITH PROPOFOL;  Surgeon: Christena Deem, MD;  Location: [facility];  Service: Endoscopy;  Laterality: N/A;   ESOPHAGOGASTRODUODENOSCOPY (EGD) WITH PROPOFOL N/A 06/17/2017   Procedure: ESOPHAGOGASTRODUODENOSCOPY (EGD) WITH PROPOFOL;  Surgeon: Wyline Mood, MD;   Location: [facility] ENDOSCOPY;  Service: Gastroenterology;  Laterality: N/A;   ESOPHAGOSCOPY W/ BOTOX INJECTION N/A 02/17/2016   Procedure: ESOPHAGOSCOPY WITH BOTOX INJECTION  ;  Surgeon: Flo Shanks, MD;  Location: [facility];  Service: ENT;  Laterality: N/A;   HEMORROIDECTOMY     UPPER GI ENDOSCOPY  03-06-2013   Dr Ricki Rodriguez    Home Medications:  Allergies as of 09/15/2023       Reactions   Duloxetine    Unable to urinate   Nsaids Other (See Comments)   CANNOT TAKE NSAIDS BECAUSE OF ESOPHAGUS ISSUES CANNOT TAKE NSAIDS BECAUSE OF ESOPHAGUS ISSUES   Rosuvastatin Other (See Comments)   SEVERE MUSCLE ACHES SEVERE MUSCLE ACHES   Statins Other (See Comments)   SEVERE MUSCLE ACHES SEVERE MUSCLE ACHES Other reaction(s): Other (See Comments) SEVERE MUSCLE ACHES   Tolmetin Other (See Comments)   CANNOT TAKE NSAIDS BECAUSE OF ESOPHAGUS ISSUES CANNOT TAKE NSAIDS BECAUSE OF ESOPHAGUS ISSUES CANNOT TAKE NSAIDS BECAUSE OF ESOPHAGUS ISSUES CANNOT TAKE NSAIDS BECAUSE OF ESOPHAGUS ISSUES        Medication List        Accurate as of September 15, 2023  1:28 PM. If you have any questions, ask your nurse or doctor.          STOP taking these medications    tamsulosin 0.4 MG Caps capsule Commonly known as: FLOMAX Stopped by: Riki Altes       TAKE these medications    clonazePAM 2 MG tablet Commonly known as: KLONOPIN Take 2 mg by mouth 3 (three) times daily as needed.   donepezil 5 MG tablet Commonly known as: ARICEPT Take 1 tablet (5 mg total) by mouth at bedtime.   doxycycline 20 MG tablet Commonly known as: PERIOSTAT Take 2 tabs po QD with food.   esomeprazole 40 MG capsule Commonly known as: NEXIUM Take 40 mg by mouth daily.   finasteride 5 MG tablet Commonly known as: PROSCAR TAKE 1 TABLET DAILY   ipratropium 0.06 % nasal spray Commonly known as: ATROVENT Place into the nose as needed for rhinitis.   ketoconazole 2 % cream Commonly known as:  NIZORAL daily.   levothyroxine 88 MCG tablet Commonly known as: SYNTHROID Take 1 tablet (88 mcg total) by mouth daily before breakfast.   lidocaine 5 % Commonly known as: LIDODERM 1 patch daily.   losartan 25 MG tablet Commonly known as: COZAAR Take 1 tablet (25 mg total) by mouth daily.   mirtazapine 7.5 MG tablet Commonly known as: REMERON Take 1 tablet (7.5 mg total) by mouth at bedtime.   mometasone 0.1 % lotion Commonly known as: ELOCON Apply topically daily. Apply to aa's scalp QD-BID PRN.   pantoprazole 40 MG tablet Commonly known as: PROTONIX Take 1 tablet (40 mg total) by mouth daily.   Stiolto Respimat 2.5-2.5 MCG/ACT Aers Generic drug: Tiotropium Bromide-Olodaterol Inhale 2 each into the lungs daily at 2 PM.   Sulfacetamide Sodium 10 % Sham Shampoo into the scalp let sit  5 minutes then wash out. Use QD.   Theratears PF 0.25 % Soln Generic drug: Carboxymethylcellulose Sod PF Apply to eye as needed (dry eyes).   valACYclovir 500 MG tablet Commonly known as: VALTREX TAKE 1 TABLET BY MOUTH DAILY        Allergies:  Allergies  Allergen Reactions   Duloxetine     Unable to urinate   Nsaids Other (See Comments)    CANNOT TAKE NSAIDS BECAUSE OF ESOPHAGUS ISSUES CANNOT TAKE NSAIDS BECAUSE OF ESOPHAGUS ISSUES   Rosuvastatin Other (See Comments)    SEVERE MUSCLE ACHES SEVERE MUSCLE ACHES   Statins Other (See Comments)    SEVERE MUSCLE ACHES SEVERE MUSCLE ACHES Other reaction(s): Other (See Comments) SEVERE MUSCLE ACHES   Tolmetin Other (See Comments)    CANNOT TAKE NSAIDS BECAUSE OF ESOPHAGUS ISSUES CANNOT TAKE NSAIDS BECAUSE OF ESOPHAGUS ISSUES CANNOT TAKE NSAIDS BECAUSE OF ESOPHAGUS ISSUES CANNOT TAKE NSAIDS BECAUSE OF ESOPHAGUS ISSUES    Family History: Family History  Problem Relation Age of Onset   Heart failure Mother    Heart failure Father    Emphysema Father        smoked   Cancer Sister    Aneurysm Sister        Brain    Social  History:  reports that he quit smoking about 19 years ago. His smoking use included cigarettes. He started smoking about 49 years ago. He has a 30 pack-year smoking history. He has never used smokeless tobacco. He reports that he does not drink alcohol and does not use drugs.   Physical Exam: BP (!) 140/80   Pulse 74   Ht 5\' 10"  (1.778 m)   Wt 155 lb (70.3 kg)   BMI 22.24 kg/m   Constitutional:  Alert and oriented, No acute distress. HEENT: [place] AT Respiratory: Normal respiratory effort, no increased work of breathing. Psychiatric: Normal mood and affect.   Assessment & Plan:    1. BPH with LUTS Stable lower urinary tract symptoms Recommend stopping tamsulosin and continuing finasteride. He did not need a refill If voiding symptoms worsen after stopping tamsulosin, he may restart.   2. History of elevated PSA with high grade PIN PSA remains normal and stable Based on age prostate cancer screening guidlines recommend discontinuing annual PSA  I have reviewed the above documentation for accuracy and completeness, and I agree with the above.   Riki Altes, MD  [facility] [address], [address] [place], [place] [address] [phone]

## 2023-12-08 ENCOUNTER — Encounter: Payer: Self-pay | Admitting: Dermatology

## 2023-12-08 ENCOUNTER — Ambulatory Visit: Admitting: Dermatology

## 2023-12-08 DIAGNOSIS — L219 Seborrheic dermatitis, unspecified: Secondary | ICD-10-CM | POA: Diagnosis not present

## 2023-12-08 DIAGNOSIS — L853 Xerosis cutis: Secondary | ICD-10-CM

## 2023-12-08 DIAGNOSIS — M792 Neuralgia and neuritis, unspecified: Secondary | ICD-10-CM

## 2023-12-08 MED ORDER — KETOCONAZOLE 2 % EX CREA: TOPICAL_CREAM | Freq: Every day | CUTANEOUS | 6 refills | Status: AC

## 2023-12-08 NOTE — Patient Instructions (Addendum)
 Start Ketoconazole  2% cream to face nightly, prescription sent to pharmacy Start Cerave SA cream or Amlactin cream or La Roche-Posay Lipikar Urea 10% or Cerave Intensive Moisturizing lotion or Cerave Psoriasis cream  to face, neck and back 1 to 2 times daily   For scalp Start Cerave Shampoo or Vichy Dercos Selenium sulfide shampoo every shampoo, let sit 5 minutes and rinse out, samples given.  You can purchase these over the counter   Due to recent changes in healthcare laws, you may see results of your pathology and/or laboratory studies on MyChart before the doctors have had a chance to review them. We understand that in some cases there may be results that are confusing or concerning to you. Please understand that not all results are received at the same time and often the doctors may need to interpret multiple results in order to provide you with the best plan of care or course of treatment. Therefore, we ask that you please give us  2 business days to thoroughly review all your results before contacting the office for clarification. Should we see a critical lab result, you will be contacted sooner.   If You Need Anything After Your Visit  If you have any questions or concerns for your doctor, please call our main line at [phone] and press option 4 to reach your doctor's medical assistant. If no one answers, please leave a voicemail as directed and we will return your call as soon as possible. Messages left after 4 pm will be answered the following business day.   You may also send us  a message via MyChart. We typically respond to MyChart messages within 1-2 business days.  For prescription refills, please ask your pharmacy to contact our office. Our fax number is [phone].  If you have an urgent issue when the clinic is closed that cannot wait until the next business day, you can page your doctor at the number below.    Please note that while we do our best to be available for  urgent issues outside of office hours, we are not available 24/7.   If you have an urgent issue and are unable to reach us , you may choose to seek medical care at your doctor's office, retail clinic, urgent care center, or emergency room.  If you have a medical emergency, please immediately call 911 or go to the emergency department.  Pager Numbers  - Dr. Bary Likes: [phone]  - Dr. Annette Barters: [phone]  - Dr. Felipe Horton: [phone]   In the event of inclement weather, please call our main line at [phone] for an update on the status of any delays or closures.  Dermatology Medication Tips: Please keep the boxes that topical medications come in in order to help keep track of the instructions about where and how to use these. Pharmacies typically print the medication instructions only on the boxes and not directly on the medication tubes.   If your medication is too expensive, please contact our office at [phone] option 4 or send us  a message through MyChart.   We are unable to tell what your co-pay for medications will be in advance as this is different depending on your insurance coverage. However, we may be able to find a substitute medication at lower cost or fill out paperwork to get insurance to cover a needed medication.   If a prior authorization is required to get your medication covered by your insurance company, please allow us  1-2 business days to complete this process.  Drug prices often vary depending on where the prescription is filled and some pharmacies may offer cheaper prices.  The website www.goodrx.com contains coupons for medications through different pharmacies. The prices here do not account for what the cost may be with help from insurance (it may be cheaper with your insurance), but the website can give you the price if you did not use any insurance.  - You can print the associated coupon and take it with your prescription to the pharmacy.  - You may also  stop by our office during regular business hours and pick up a GoodRx coupon card.  - If you need your prescription sent electronically to a different pharmacy, notify our office through [facility] or by phone at [phone] option 4.     Si Usted Necesita Algo Despus de Su Visita  Tambin puede enviarnos un mensaje a travs de Clinical cytogeneticist. Por lo general respondemos a los mensajes de MyChart en el transcurso de 1 a 2 das hbiles.  Para renovar recetas, por favor pida a su farmacia que se ponga en contacto con nuestra oficina. Franz Jacks de fax es [place] [phone].  Si tiene un asunto urgente cuando la clnica est cerrada y que no puede esperar hasta el siguiente da hbil, puede llamar/localizar a su doctor(a) al nmero que aparece a continuacin.   Por favor, tenga en cuenta que aunque hacemos todo lo posible para estar disponibles para asuntos urgentes fuera del horario de [place], no estamos disponibles las 24 horas del da, los 7 [address]  [address] de la [place].   Si tiene un problema urgente y no puede comunicarse con nosotros, puede optar por buscar atencin mdica  en el consultorio de su doctor(a), en una clnica privada, en un centro de atencin urgente o en una sala de emergencias.  Si tiene Engineer, drilling, por favor llame inmediatamente al 911 o vaya a la sala de emergencias.  Nmeros de bper  - Dr. Bary Likes: [phone]  - Dra. Annette Barters: [phone]  - Dr. Felipe Horton: [phone]   En caso de inclemencias del tiempo, por favor llame a Lajuan Pila principal al [phone] para una actualizacin sobre el [place] de cualquier retraso o cierre.  Consejos para la medicacin en dermatologa: Por favor, guarde las cajas en las que vienen los medicamentos de uso tpico para ayudarle a seguir las instrucciones sobre dnde y cmo usarlos. Las farmacias generalmente imprimen las instrucciones del medicamento slo en las cajas y no directamente en los tubos del [place].    Si su medicamento es muy caro, por favor, pngase en contacto con Bettyjane Brunet llamando al [phone] y presione la opcin 4 o envenos un mensaje a travs de Clinical cytogeneticist.   No podemos decirle cul ser su copago por los medicamentos por adelantado ya que esto es diferente dependiendo de la cobertura de su seguro. Sin embargo, es posible que podamos encontrar un medicamento sustituto a Audiological scientist un formulario para que el seguro cubra el medicamento que se considera necesario.   Si se requiere una autorizacin previa para que su compaa de seguros [place] su medicamento, por favor permtanos de 1 a 2 das hbiles para completar este proceso.  Los precios de los medicamentos varan con frecuencia dependiendo del Environmental consultant de dnde se surte la receta y alguna farmacias pueden ofrecer precios ms baratos.  El sitio web www.goodrx.com tiene cupones para medicamentos de Health and safety inspector. Los precios aqu no tienen en cuenta lo que podra costar con la ayuda del seguro (puede ser  ms barato con su seguro), pero el sitio web puede darle el precio si no Visual merchandiser.  - Puede imprimir el cupn correspondiente y llevarlo con su receta a la farmacia.  - Tambin puede pasar por nuestra oficina durante el horario de atencin regular y Education officer, museum una tarjeta de cupones de GoodRx.  - Si necesita que su receta se enve electrnicamente a una farmacia diferente, informe a nuestra oficina a travs de MyChart de [place] o por telfono llamando al [phone] y presione la opcin 4.

## 2023-12-08 NOTE — Progress Notes (Signed)
   Follow-Up Visit   Subjective  Timothy Turner is a 83 y.o. male who presents for the following: Seb derm with component of neuralgia, scalp, 5m itchy, not taking Doxycycline  20mg , not using Sulfacetamide  10% shampoo, using otc shampoo, Says Timothy Turner of the prescription shampoos worked for him. Mometasone  sol pt not sure if he is using- didn't help, bumps face, chest, back that a waxy substance comes off when she scratches them.  The patient has spots, moles and lesions to be evaluated, some may be new or changing and the patient may have concern these could be cancer.  Patient accompanied by wife who contributes to history.  The following portions of the chart were reviewed this encounter and updated as appropriate: medications, allergies, medical history  Review of Systems:  No other skin or systemic complaints except as noted in HPI or Assessment and Plan.  Objective  Well appearing patient in no apparent distress; mood and affect are within normal limits.   A focused examination was performed of the following areas: Scalp, face, chest, back  Relevant exam findings are noted in the Assessment and Plan.    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 Condition is symptomatic/ bothersome to patient. Not currently at goal.   Seborrheic Dermatitis is a chronic persistent rash characterized by pinkness and scaling most commonly of the mid face but also can occur on the scalp (dandruff), ears; mid chest, mid back and groin.  It tends to be exacerbated by stress and cooler weather.  People who have neurologic disease may experience new onset or exacerbation of existing seborrheic dermatitis.  The condition is not curable but treatable and can be controlled.  Treatment Plan: Start Cerave anti-dandruff  Shampoo or Vichy Dercos Selenium sulfide shampoo q shampoo, let sit 5 minutes and rinse out, samples gvien Restart Ketoconazole  2% cream qhs to aas face, including eyebrows  XEROSIS Face, neck, back Exam: xerosis forehead, cheeks, neck, back  Treatment Plan: Start Cerave SA cream or Amlactin cream or La Roche-Posay Lipikar Urea 10% or Cerave Intensive Moisturizing lotion or Cerave Psoriasis cream  to face, neck and back 1-2x daily, samples of each given to try to see what he prefers. Cont Everardo Hitch bodywash     Return in about 3 months (around 03/09/2024) for seb derm f/u, Xerosis f/u.  I, Rollie Clipper, RMA, am acting as scribe for Artemio Larry, MD .   Documentation: I have reviewed the above documentation for accuracy and completeness, and I agree with the above.  Artemio Larry, MD

## 2024-02-18 ENCOUNTER — Other Ambulatory Visit
Admission: RE | Admit: 2024-02-18 | Discharge: 2024-02-18 | Disposition: A | Discharge status: Home / Self Care | Source: Ambulatory Visit | Attending: Infectious Diseases | Admitting: Infectious Diseases

## 2024-02-18 DIAGNOSIS — I129 Hypertensive chronic kidney disease with stage 1 through stage 4 chronic kidney disease, or unspecified chronic kidney disease: Secondary | ICD-10-CM | POA: Diagnosis present

## 2024-02-18 DIAGNOSIS — J432 Centrilobular emphysema: Secondary | ICD-10-CM | POA: Diagnosis not present

## 2024-02-18 DIAGNOSIS — E039 Hypothyroidism, unspecified: Secondary | ICD-10-CM | POA: Diagnosis not present

## 2024-02-18 DIAGNOSIS — N1832 Chronic kidney disease, stage 3b: Secondary | ICD-10-CM | POA: Insufficient documentation

## 2024-02-18 LAB — CBC WITH DIFFERENTIAL/PLATELET
Abs Immature Granulocytes: 0.39 K/uL — ABNORMAL HIGH (ref 0.00–0.07)
Basophils Absolute: 0.1 K/uL (ref 0.0–0.1)
Basophils Relative: 0 %
Eosinophils Absolute: 0.3 K/uL (ref 0.0–0.5)
Eosinophils Relative: 1 %
HCT: 44.3 % (ref 39.0–52.0)
Hemoglobin: 14 g/dL (ref 13.0–17.0)
Immature Granulocytes: 1 %
Lymphocytes Relative: 53 %
Lymphs Abs: 17 K/uL — ABNORMAL HIGH (ref 0.7–4.0)
MCH: 32.1 pg (ref 26.0–34.0)
MCHC: 31.6 g/dL (ref 30.0–36.0)
MCV: 101.6 fL — ABNORMAL HIGH (ref 80.0–100.0)
Monocytes Absolute: 1.5 K/uL — ABNORMAL HIGH (ref 0.1–1.0)
Monocytes Relative: 5 %
Neutro Abs: 12.8 K/uL — ABNORMAL HIGH (ref 1.7–7.7)
Neutrophils Relative %: 40 %
Platelets: 273 K/uL (ref 150–400)
RBC: 4.36 MIL/uL (ref 4.22–5.81)
RDW: 17 % — ABNORMAL HIGH (ref 11.5–15.5)
Smear Review: NORMAL
WBC: 32.1 K/uL — ABNORMAL HIGH (ref 4.0–10.5)
nRBC: 0.1 % (ref 0.0–0.2)

## 2024-02-22 LAB — PATHOLOGIST SMEAR REVIEW

## 2024-03-03 ENCOUNTER — Inpatient Hospital Stay

## 2024-03-03 ENCOUNTER — Inpatient Hospital Stay: Attending: Oncology | Admitting: Oncology

## 2024-03-03 ENCOUNTER — Encounter: Payer: Self-pay | Admitting: Oncology

## 2024-03-03 VITALS — BP 100/62 | HR 91 | Temp 96.1°F | Resp 18 | Wt 161.1 lb

## 2024-03-03 DIAGNOSIS — Z139 Encounter for screening, unspecified: Secondary | ICD-10-CM

## 2024-03-03 DIAGNOSIS — D72829 Elevated white blood cell count, unspecified: Secondary | ICD-10-CM

## 2024-03-03 DIAGNOSIS — D72820 Lymphocytosis (symptomatic): Secondary | ICD-10-CM | POA: Diagnosis present

## 2024-03-03 DIAGNOSIS — Z79899 Other long term (current) drug therapy: Secondary | ICD-10-CM | POA: Insufficient documentation

## 2024-03-03 DIAGNOSIS — J439 Emphysema, unspecified: Secondary | ICD-10-CM | POA: Insufficient documentation

## 2024-03-03 DIAGNOSIS — C9110 Chronic lymphocytic leukemia of B-cell type not having achieved remission: Secondary | ICD-10-CM | POA: Insufficient documentation

## 2024-03-03 DIAGNOSIS — D72821 Monocytosis (symptomatic): Secondary | ICD-10-CM | POA: Diagnosis not present

## 2024-03-03 DIAGNOSIS — D7589 Other specified diseases of blood and blood-forming organs: Secondary | ICD-10-CM | POA: Insufficient documentation

## 2024-03-03 DIAGNOSIS — R0609 Other forms of dyspnea: Secondary | ICD-10-CM | POA: Diagnosis not present

## 2024-03-03 DIAGNOSIS — N1832 Chronic kidney disease, stage 3b: Secondary | ICD-10-CM | POA: Diagnosis not present

## 2024-03-03 LAB — CMP (CANCER CENTER ONLY)
ALT: 24 U/L (ref 0–44)
AST: 21 U/L (ref 15–41)
Albumin: 3.7 g/dL (ref 3.5–5.0)
Alkaline Phosphatase: 53 U/L (ref 38–126)
Anion gap: 6 (ref 5–15)
BUN: 43 mg/dL — ABNORMAL HIGH (ref 8–23)
CO2: 20 mmol/L — ABNORMAL LOW (ref 22–32)
Calcium: 8.8 mg/dL — ABNORMAL LOW (ref 8.9–10.3)
Chloride: 108 mmol/L (ref 98–111)
Creatinine: 1.75 mg/dL — ABNORMAL HIGH (ref 0.61–1.24)
GFR, Estimated: 39 mL/min — ABNORMAL LOW (ref >60)
Glucose, Bld: 108 mg/dL — ABNORMAL HIGH (ref 70–99)
Potassium: 4.5 mmol/L (ref 3.5–5.1)
Sodium: 134 mmol/L — ABNORMAL LOW (ref 135–145)
Total Bilirubin: 0.7 mg/dL (ref 0.0–1.2)
Total Protein: 6.6 g/dL (ref 6.5–8.1)

## 2024-03-03 LAB — CBC WITH DIFFERENTIAL/PLATELET
Abs Immature Granulocytes: 0.39 K/uL — ABNORMAL HIGH (ref 0.00–0.07)
Basophils Absolute: 0.1 K/uL (ref 0.0–0.1)
Basophils Relative: 0 %
Eosinophils Absolute: 0.1 K/uL (ref 0.0–0.5)
Eosinophils Relative: 0 %
HCT: 44.4 % (ref 39.0–52.0)
Hemoglobin: 14.2 g/dL (ref 13.0–17.0)
Immature Granulocytes: 1 %
Lymphocytes Relative: 47 %
Lymphs Abs: 15.7 K/uL — ABNORMAL HIGH (ref 0.7–4.0)
MCH: 32.1 pg (ref 26.0–34.0)
MCHC: 32 g/dL (ref 30.0–36.0)
MCV: 100.5 fL — ABNORMAL HIGH (ref 80.0–100.0)
Monocytes Absolute: 1.9 K/uL — ABNORMAL HIGH (ref 0.1–1.0)
Monocytes Relative: 6 %
Neutro Abs: 15.5 K/uL — ABNORMAL HIGH (ref 1.7–7.7)
Neutrophils Relative %: 46 %
Platelets: 183 K/uL (ref 150–400)
RBC: 4.42 MIL/uL (ref 4.22–5.81)
RDW: 16.4 % — ABNORMAL HIGH (ref 11.5–15.5)
Smear Review: NORMAL
WBC: 33.6 K/uL — ABNORMAL HIGH (ref 4.0–10.5)
nRBC: 0 % (ref 0.0–0.2)

## 2024-03-03 LAB — HEPATITIS PANEL, ACUTE
HCV Ab: NONREACTIVE
Hep A IgM: NONREACTIVE
Hep B C IgM: NONREACTIVE
Hepatitis B Surface Ag: NONREACTIVE

## 2024-03-03 LAB — VITAMIN B12: Vitamin B-12: 830 pg/mL (ref 180–914)

## 2024-03-03 LAB — FOLATE: Folate: >20 ng/mL (ref >5.9)

## 2024-03-03 LAB — HIV ANTIBODY (ROUTINE TESTING W REFLEX): HIV Screen 4th Generation wRfx: NONREACTIVE

## 2024-03-03 LAB — LACTATE DEHYDROGENASE: LDH: 309 U/L — ABNORMAL HIGH (ref 98–192)

## 2024-03-03 NOTE — Assessment & Plan Note (Addendum)
 Acute on chronic.  He has emphysema.  I recommend patient to see pulmonology for further evaluation.

## 2024-03-03 NOTE — Assessment & Plan Note (Signed)
 Progressively worse leukocytosis, neutrophilia, monocytosis, lymphocytosis.  Labs reviewed and discussed with patient that Leukocytosis, differential diagnosis is broad, acute or chronic infection and inflammation, steroid use,  autoimmune disease, or underlying bone marrow disorders.   For the work up of patient's leukocytosis, I recommend checking CBC;CMP, LDH, smear review, peripheral flowcytometry, hepatitis, HIV, monoclonal gammopathy workup.

## 2024-03-03 NOTE — Progress Notes (Signed)
 Hematology/Oncology Consult note [phone] [phone]        REFERRING PROVIDER: Epifanio Alm SQUIBB, MD   CHIEF COMPLAINTS/REASON FOR VISIT:  Evaluation of leukocytosis.    ASSESSMENT & PLAN:   Leukocytosis Progressively worse leukocytosis, neutrophilia, monocytosis, lymphocytosis.  Labs reviewed and discussed with patient that Leukocytosis, differential diagnosis is broad, acute or chronic infection and inflammation, steroid use,  autoimmune disease, or underlying bone marrow disorders.   For the work up of patient's leukocytosis, I recommend checking CBC;CMP, LDH, smear review, peripheral flowcytometry, hepatitis, HIV, monoclonal gammopathy workup.    Macrocytosis Check B12 and folate.   Dyspnea on exertion Acute on chronic.  He has emphysema.  I recommend patient to see pulmonology for further evaluation.    Orders Placed This Encounter  Procedures   CBC with Differential/Platelet    Standing Status:   Future    Number of Occurrences:   1    Expected Date:   03/03/2024    Expiration Date:   06/01/2024   Kappa/lambda light chains    Standing Status:   Future    Number of Occurrences:   1    Expected Date:   03/03/2024    Expiration Date:   06/01/2024   Multiple Myeloma Panel (SPEP&IFE w/QIG)    Standing Status:   Future    Number of Occurrences:   1    Expected Date:   03/03/2024    Expiration Date:   06/01/2024   Flow cytometry panel-leukemia/lymphoma work-up    Standing Status:   Future    Number of Occurrences:   1    Expected Date:   03/03/2024    Expiration Date:   06/01/2024   Lactate dehydrogenase    Standing Status:   Future    Number of Occurrences:   1    Expected Date:   03/03/2024    Expiration Date:   06/01/2024   HIV Antibody (routine testing w rflx)    Standing Status:   Future    Number of Occurrences:   1    Expected Date:   03/03/2024    Expiration Date:   06/01/2024   Hepatitis panel, acute    Standing Status:    Future    Number of Occurrences:   1    Expected Date:   03/03/2024    Expiration Date:   06/01/2024   Folate    Standing Status:   Future    Number of Occurrences:   1    Expected Date:   03/03/2024    Expiration Date:   06/01/2024   Vitamin B12    Standing Status:   Future    Number of Occurrences:   1    Expected Date:   03/03/2024    Expiration Date:   06/01/2024   CMP (Cancer Center only)    Standing Status:   Future    Number of Occurrences:   1    Expected Date:   03/03/2024    Expiration Date:   03/03/2025   Ambulatory referral to Social Work    Referral Priority:   Routine    Referral Type:   Consultation    Referral Reason:   Specialty Services Required    Number of Visits Requested:   1    All questions were answered. The patient knows to call the clinic with any problems, questions or concerns.  Zelphia Cap, MD, PhD [facility] Hematology Oncology 03/03/2024   HISTORY OF PRESENTING ILLNESS:  Timothy Turner is a  83 y.o.  male with PMH listed below was seen in consultation at the request of  Epifanio Alm SQUIBB, MD  for evaluation of leukocytosis.   Discussed the use of AI scribe software for clinical note transcription with the patient, who gave verbal consent to proceed.   I reviewed his lab records.  He has a history of elevated white blood cell counts, with a significant increase noted since August 2025, reaching levels above 30,000.  In April 2025, total wbc 10.8  Patient recently took a course steroid along with fluconazole in mid August 2025 for treatment of fungal skin rash and has also received steroid injection to left knee.   Patient reports hip pain.  He underwent a complete hip replacement following a hip fracture on February 14, 2023. About 4-6 weeks ago, he began experiencing significant aching in the hip, which was exacerbated by a recent slip and fall.   He has been experiencing difficulty breathing for approximately four weeks, which has  worsened over time. He is unable to walk across a room without experiencing shortness of breath. He has a history of smoking and has been diagnosed with emphysema. He is established with pulmonology.   He notes a change in urine color to a darker brown over the past three to four weeks, which has not improved. He has been advised in the past to monitor water intake due to kidney function concerns.  He has a history of psoriasis, which is generally well-controlled except for itching at the edges of his eyes and scalp. No recent weight loss or excessive sweating at night. He reports a good appetite and has maintained stable weight. H recently finished a course Megace.   He has history of PE, diagnosed in 2019, provoked by a preceding history of left lower extremity trauma/injury, temporarily immobilization. Off anticoagulation currently.   MEDICAL HISTORY:  Past Medical History:  Diagnosis Date   Aspiration precautions    uses thicken in liquids.  Eats pureed food.   Barrett esophagus    Depression    Gallstones    GERD (gastroesophageal reflux disease)    Heart murmur    History of   Hemorrhoids    Hypertension    Hypothyroidism    Iron deficiency anemia 02/10/2012   Mitral valve disease    Myalgia    Multiple   Neuromuscular disorder (HCC)    PAD (peripheral artery disease) (HCC)    lower legs   Pain syndrome, chronic    Severe   Pneumonia    three times, last time >10 years ago   Psoriasis    Pulmonary embolism (HCC) 2019   left lung   Rheumatic heart valve incompetence    Shortness of breath dyspnea    Thyroiditis     SURGICAL HISTORY: Past Surgical History:  Procedure Laterality Date   BACK SURGERY     CARDIAC CATHETERIZATION N/A 02/22/2015   Procedure: Left Heart Cath and Coronary Angiography;  Surgeon: Evalene JINNY Lunger, MD;  Location: [facility];  Service: Cardiovascular;  Laterality: N/A;   CATARACT EXTRACTION W/PHACO Left 07/29/2016   Procedure: CATARACT  EXTRACTION PHACO AND INTRAOCULAR LENS PLACEMENT (IOC)  Left eye IVA Topical;  Surgeon: Dene Etienne, MD;  Location: [facility];  Service: Ophthalmology;  Laterality: Left;   CATARACT EXTRACTION W/PHACO Right 08/19/2016   Procedure: CATARACT EXTRACTION PHACO AND INTRAOCULAR LENS PLACEMENT (IOC) right  TORIC;  Surgeon: Dene Etienne, MD;  Location: [facility];  Service: Ophthalmology;  Laterality: Right;  prefers early morning   CHOLECYSTECTOMY  08/03/13   COLONOSCOPY  03-08-12   Dr Luellen   COLONOSCOPY WITH PROPOFOL  N/A 06/17/2017   Procedure: COLONOSCOPY WITH PROPOFOL ;  Surgeon: Therisa Bi, MD;  Location: [facility] ENDOSCOPY;  Service: Gastroenterology;  Laterality: N/A;   DIRECT LARYNGOSCOPY N/A 02/17/2016   Procedure: DIRECT LARYNGOSCOPY;  Surgeon: Marlyce Finer, MD;  Location: [facility];  Service: ENT;  Laterality: N/A;   ESOPHAGEAL MANOMETRY N/A 02/27/2015   Procedure: ESOPHAGEAL MANOMETRY (EM);  Surgeon: Gladis RAYMOND Mariner, MD;  Location: [facility];  Service: Endoscopy;  Laterality: N/A;   ESOPHAGOGASTRODUODENOSCOPY (EGD) WITH PROPOFOL  N/A 02/18/2015   Procedure: ESOPHAGOGASTRODUODENOSCOPY (EGD) WITH PROPOFOL ;  Surgeon: Gladis RAYMOND Mariner, MD;  Location: [facility]COPY;  Service: Endoscopy;  Laterality: N/A;   ESOPHAGOGASTRODUODENOSCOPY (EGD) WITH PROPOFOL  N/A 06/17/2017   Procedure: ESOPHAGOGASTRODUODENOSCOPY (EGD) WITH PROPOFOL ;  Surgeon: Therisa Bi, MD;  Location: [facility] ENDOSCOPY;  Service: Gastroenterology;  Laterality: N/A;   ESOPHAGOSCOPY W/ BOTOX  INJECTION N/A 02/17/2016   Procedure: ESOPHAGOSCOPY WITH BOTOX  INJECTION  ;  Surgeon: Marlyce Finer, MD;  Location:  SURGERY CENTER;  Service: ENT;  Laterality: N/A;   HEMORROIDECTOMY     UPPER GI ENDOSCOPY  03-06-2013   Dr Luellen    SOCIAL HISTORY: Social History   Socioeconomic History   Marital status: Married    Spouse name: Not on file   Number of children: 2   Years of  education: Not on file   Highest education level: Not on file  Occupational History   Occupation: [employer] Education officer, environmental    Comment: Retired   Occupation: Disabled  Tobacco Use   Smoking status: Former    Current packs/day: 0.00    Average packs/day: 1 pack/day for 30.0 years (30.0 ttl pk-yrs)    Types: Cigarettes    Start date: 06/22/1974    Quit date: 06/22/2004    Years since quitting: 19.7   Smokeless tobacco: Never  Vaping Use   Vaping status: Never Used  Substance and Sexual Activity   Alcohol use: No    Alcohol/week: 0.0 standard drinks of alcohol   Drug use: No   Sexual activity: Yes  Other Topics Concern   Not on file  Social History Narrative   Drinks 4-5 cups of coffee a day and 3-4 cans of soda a day.   Social Drivers of Corporate investment banker Strain: Low Risk  (02/03/2024)   Received from [facility]   Overall Financial Resource Strain (CARDIA)    Difficulty of Paying Living Expenses: Not hard at all  Food Insecurity: No Food Insecurity (03/03/2024)   Hunger Vital Sign    Worried About Running Out of Food in the Last Year: Never true    Ran Out of Food in the Last Year: Never true  Transportation Needs: No Transportation Needs (03/03/2024)   PRAPARE - Administrator, Civil Service (Medical): No    Lack of Transportation (Non-Medical): No  Physical Activity: Sufficiently Active (03/30/2023)   Received from [facility]   Exercise Vital Sign    On average, how many days per week do you engage in moderate to strenuous exercise (like a brisk walk)?: 7 days    On average, how many minutes do you engage in exercise at this level?: 30 min  Stress: Stress Concern Present (03/30/2023)   Received from [facility] - Occupational Stress Questionnaire    Feeling of Stress : To some extent  Social Connections: Moderately Integrated (03/30/2023)   Received  from [facility]   Social Connection and Isolation Panel    In a typical week, how many times do you talk on the phone with family, friends, or neighbors?: More than three times a week    How often do you get together with friends or relatives?: Once a week    How often do you attend church or religious services?: 1 to 4 times per year    Do you belong to any clubs or organizations such as church groups, unions, fraternal or athletic groups, or school groups?: No    How often do you attend meetings of the clubs or organizations you belong to?: Never    Are you married, widowed, divorced, separated, never married, or living with a partner?: Married  Intimate Partner Violence: Not At Risk (03/03/2024)   Humiliation, Afraid, Rape, and Kick questionnaire    Fear of Current or Ex-Partner: No    Emotionally Abused: No    Physically Abused: No    Sexually Abused: No    FAMILY HISTORY: Family History  Problem Relation Age of Onset   Heart failure Mother    Heart failure Father    Emphysema Father        smoked   Cancer Sister    Aneurysm Sister        Brain    ALLERGIES:  is allergic to duloxetine, nsaids, rosuvastatin, statins, and tolmetin.  MEDICATIONS:  Current Outpatient Medications  Medication Sig Dispense Refill   Carboxymethylcellulose Sod PF (THERATEARS PF) 0.25 % SOLN Apply to eye as needed (dry eyes).     clonazePAM  (KLONOPIN ) 2 MG tablet Take 2 mg by mouth 3 (three) times daily as needed.     donepezil  (ARICEPT ) 5 MG tablet Take 1 tablet (5 mg total) by mouth at bedtime. 30 tablet 0   esomeprazole  (NEXIUM ) 40 MG capsule Take 40 mg by mouth daily.     finasteride  (PROSCAR ) 5 MG tablet TAKE 1 TABLET DAILY 90 tablet 3   ipratropium (ATROVENT) 0.06 % nasal spray Place into the nose as needed for rhinitis.     ketoconazole  (NIZORAL ) 2 % cream Apply topically at bedtime. qhs to face for seborrheic dermatitis 60 g 6   levothyroxine  (SYNTHROID ) 112 MCG tablet Take  112 mcg by mouth daily.     lidocaine  (LIDODERM ) 5 % 1 patch daily.     losartan  (COZAAR ) 25 MG tablet Take 1 tablet (25 mg total) by mouth daily. 30 tablet 0   megestrol (MEGACE ES) 625 MG/5ML suspension Take 625 mg by mouth.     mirtazapine  (REMERON ) 7.5 MG tablet Take 1 tablet (7.5 mg total) by mouth at bedtime. 30 tablet 0   mometasone  (ELOCON ) 0.1 % lotion Apply topically daily. Apply to aa's scalp QD-BID PRN. 60 mL 2   pantoprazole  (PROTONIX ) 40 MG tablet Take 1 tablet (40 mg total) by mouth daily. 30 tablet 0   Tiotropium Bromide -Olodaterol (STIOLTO RESPIMAT ) 2.5-2.5 MCG/ACT AERS Inhale 2 each into the lungs daily at 2 PM. 4 g 0   traMADol  (ULTRAM ) 50 MG tablet Take 50 mg by mouth every 6 (six) hours as needed.     traZODone (DESYREL) 150 MG tablet Take 150 mg by mouth.     valACYclovir  (VALTREX ) 500 MG tablet TAKE 1 TABLET BY MOUTH DAILY 30 tablet 0   levothyroxine  (  SYNTHROID ) 88 MCG tablet Take 1 tablet (88 mcg total) by mouth daily before breakfast. (Patient not taking: Reported on 03/03/2024) 30 tablet 0   Sulfacetamide  Sodium 10 % SHAM Shampoo into the scalp let sit 5 minutes then wash out. Use QD. (Patient not taking: Reported on 03/03/2024) 237 mL 2   No current facility-administered medications for this visit.    Review of Systems  Constitutional:  Positive for fatigue. Negative for appetite change, chills, fever and unexpected weight change.  HENT:   Negative for hearing loss and voice change.   Eyes:  Negative for eye problems and icterus.  Respiratory:  Positive for shortness of breath. Negative for chest tightness and cough.   Cardiovascular:  Negative for chest pain and leg swelling.  Gastrointestinal:  Negative for abdominal distention, abdominal pain, nausea and vomiting.  Endocrine: Negative for hot flashes.  Genitourinary:  Negative for difficulty urinating, dysuria and frequency.   Musculoskeletal:  Positive for arthralgias.  Skin:  Negative for itching and rash.   Neurological:  Negative for light-headedness and numbness.  Hematological:  Negative for adenopathy. Does not bruise/bleed easily.  Psychiatric/Behavioral:  Negative for confusion.    PHYSICAL EXAMINATION:  Vitals:   03/03/24 1108  BP: 100/62  Pulse: 91  Resp: 18  Temp: (!) 96.1 F (35.6 C)  SpO2: 97%   Filed Weights   03/03/24 1108  Weight: 161 lb 1.6 oz (73.1 kg)    Physical Exam Constitutional:      General: He is not in acute distress.    Comments: Frail elderly male.  HENT:     Head: Normocephalic and atraumatic.  Eyes:     General: No scleral icterus. Cardiovascular:     Rate and Rhythm: Normal rate and regular rhythm.     Heart sounds: Normal heart sounds.  Pulmonary:     Effort: Pulmonary effort is normal. No respiratory distress.     Breath sounds: No wheezing.  Abdominal:     General: Bowel sounds are normal. There is no distension.     Palpations: Abdomen is soft.  Musculoskeletal:        General: No deformity. Normal range of motion.     Cervical back: Normal range of motion and neck supple.  Skin:    Findings: No erythema or rash.  Neurological:     Mental Status: He is alert. Mental status is at baseline.  Psychiatric:        Mood and Affect: Mood normal.     LABORATORY DATA:  I have reviewed the data as listed    Latest Ref Rng & Units 03/03/2024   11:51 AM 02/18/2024    3:00 PM 07/13/2019   10:14 AM  CBC  WBC 4.0 - 10.5 K/uL 33.6  32.1    Hemoglobin 13.0 - 17.0 g/dL 85.7  85.9  85.0   Hematocrit 39.0 - 52.0 % 44.4  44.3  45.3   Platelets 150 - 400 K/uL 183  273        Latest Ref Rng & Units 03/03/2024   11:51 AM 11/14/2019    8:52 AM 10/16/2019    9:36 AM  CMP  Glucose 70 - 99 mg/dL 891  889  896   BUN 8 - 23 mg/dL 43  17  14   Creatinine 0.61 - 1.24 mg/dL 8.24  8.55  8.51   Sodium 135 - 145 mmol/L 134  141  140   Potassium 3.5 - 5.1 mmol/L 4.5  4.4  4.9  Chloride 98 - 111 mmol/L 108  105  102   CO2 22 - 32 mmol/L 20  27  29     Calcium 8.9 - 10.3 mg/dL 8.8  9.9  9.8   Total Protein 6.5 - 8.1 g/dL 6.6  6.5  6.6   Total Bilirubin 0.0 - 1.2 mg/dL 0.7  0.6  0.8   Alkaline Phos 38 - 126 U/L 53  64  68   AST 15 - 41 U/L 21  19  22    ALT 0 - 44 U/L 24  11  12        RADIOGRAPHIC STUDIES: I have personally reviewed the radiological images as listed and agreed with the findings in the report. No results found.

## 2024-03-03 NOTE — Assessment & Plan Note (Signed)
 Check B12 and folate

## 2024-03-03 NOTE — Assessment & Plan Note (Signed)
Encourage oral hydration and avoid nephrotoxins.  Check myeloma panel, light chain ratio

## 2024-03-06 ENCOUNTER — Ambulatory Visit: Admitting: Dermatology

## 2024-03-06 LAB — KAPPA/LAMBDA LIGHT CHAINS
Kappa free light chain: 20.7 mg/L — ABNORMAL HIGH (ref 3.3–19.4)
Kappa, lambda light chain ratio: 1.12 (ref 0.26–1.65)
Lambda free light chains: 18.5 mg/L (ref 5.7–26.3)

## 2024-03-07 ENCOUNTER — Inpatient Hospital Stay

## 2024-03-07 LAB — COMP PANEL: LEUKEMIA/LYMPHOMA: Immunophenotypic Profile: 38

## 2024-03-07 NOTE — Progress Notes (Signed)
 [facility] Clinical Social Work  Clinical Social Work was referred by medical provider for need for community resources.  Clinical Social Worker contacted patient by phone to offer support and assess for needs.     Interventions: Provided patient with information about SDOH referral.  Patient stated all of his needs were met. He said he owns two homes and is not at risk for losing his homes.  He did not express feelings of depression.      Follow Up Plan:  Patient will contact CSW with any support or resource needs    Macario CHRISTELLA Au, LCSW  Clinical Social Worker [facility]

## 2024-03-08 LAB — MULTIPLE MYELOMA PANEL, SERUM
Albumin SerPl Elph-Mcnc: 3.4 g/dL (ref 2.9–4.4)
Albumin/Glob SerPl: 1.4 (ref 0.7–1.7)
Alpha 1: 0.3 g/dL (ref 0.0–0.4)
Alpha2 Glob SerPl Elph-Mcnc: 0.8 g/dL (ref 0.4–1.0)
B-Globulin SerPl Elph-Mcnc: 1 g/dL (ref 0.7–1.3)
Gamma Glob SerPl Elph-Mcnc: 0.4 g/dL (ref 0.4–1.8)
Globulin, Total: 2.5 g/dL (ref 2.2–3.9)
IgA: 89 mg/dL (ref 61–437)
IgG (Immunoglobin G), Serum: 604 mg/dL (ref 603–1613)
IgM (Immunoglobulin M), Srm: 23 mg/dL (ref 15–143)
Total Protein ELP: 5.9 g/dL — ABNORMAL LOW (ref 6.0–8.5)

## 2024-03-16 ENCOUNTER — Ambulatory Visit
Admission: RE | Admit: 2024-03-16 | Discharge: 2024-03-16 | Disposition: A | Discharge status: Home / Self Care | Source: Ambulatory Visit | Attending: Pulmonary Disease | Admitting: Pulmonary Disease

## 2024-03-16 ENCOUNTER — Encounter: Payer: Self-pay | Admitting: Pulmonary Disease

## 2024-03-16 ENCOUNTER — Other Ambulatory Visit: Payer: Self-pay | Admitting: Pulmonary Disease

## 2024-03-16 DIAGNOSIS — J189 Pneumonia, unspecified organism: Secondary | ICD-10-CM

## 2024-03-16 DIAGNOSIS — R0600 Dyspnea, unspecified: Secondary | ICD-10-CM

## 2024-03-16 DIAGNOSIS — R0689 Other abnormalities of breathing: Secondary | ICD-10-CM | POA: Diagnosis present

## 2024-03-17 ENCOUNTER — Ambulatory Visit: Admitting: Anesthesiology

## 2024-03-17 ENCOUNTER — Encounter: Payer: Self-pay | Admitting: Pulmonary Disease

## 2024-03-17 ENCOUNTER — Ambulatory Visit
Admission: RE | Admit: 2024-03-17 | Discharge: 2024-03-17 | Disposition: A | Discharge status: Home / Self Care | Attending: Pulmonary Disease | Admitting: Pulmonary Disease

## 2024-03-17 ENCOUNTER — Other Ambulatory Visit: Payer: Self-pay

## 2024-03-17 ENCOUNTER — Ambulatory Visit

## 2024-03-17 ENCOUNTER — Encounter
Admission: RE | Disposition: A | Discharge status: Home / Self Care | Payer: Self-pay | Source: Home / Self Care | Attending: Pulmonary Disease

## 2024-03-17 DIAGNOSIS — N183 Chronic kidney disease, stage 3 unspecified: Secondary | ICD-10-CM | POA: Diagnosis not present

## 2024-03-17 DIAGNOSIS — E039 Hypothyroidism, unspecified: Secondary | ICD-10-CM | POA: Diagnosis not present

## 2024-03-17 DIAGNOSIS — Z86718 Personal history of other venous thrombosis and embolism: Secondary | ICD-10-CM | POA: Diagnosis not present

## 2024-03-17 DIAGNOSIS — I10 Essential (primary) hypertension: Secondary | ICD-10-CM | POA: Insufficient documentation

## 2024-03-17 DIAGNOSIS — I129 Hypertensive chronic kidney disease with stage 1 through stage 4 chronic kidney disease, or unspecified chronic kidney disease: Secondary | ICD-10-CM | POA: Insufficient documentation

## 2024-03-17 DIAGNOSIS — Z87891 Personal history of nicotine dependence: Secondary | ICD-10-CM | POA: Diagnosis not present

## 2024-03-17 DIAGNOSIS — Z86711 Personal history of pulmonary embolism: Secondary | ICD-10-CM | POA: Diagnosis not present

## 2024-03-17 DIAGNOSIS — I739 Peripheral vascular disease, unspecified: Secondary | ICD-10-CM | POA: Insufficient documentation

## 2024-03-17 DIAGNOSIS — R7303 Prediabetes: Secondary | ICD-10-CM | POA: Diagnosis not present

## 2024-03-17 DIAGNOSIS — K219 Gastro-esophageal reflux disease without esophagitis: Secondary | ICD-10-CM | POA: Insufficient documentation

## 2024-03-17 DIAGNOSIS — J449 Chronic obstructive pulmonary disease, unspecified: Secondary | ICD-10-CM | POA: Diagnosis not present

## 2024-03-17 DIAGNOSIS — Z8546 Personal history of malignant neoplasm of prostate: Secondary | ICD-10-CM | POA: Insufficient documentation

## 2024-03-17 DIAGNOSIS — J189 Pneumonia, unspecified organism: Secondary | ICD-10-CM | POA: Insufficient documentation

## 2024-03-17 HISTORY — PX: FLEXIBLE BRONCHOSCOPY: SHX5094

## 2024-03-17 HISTORY — PX: BRONCHIAL WASHINGS: SHX5105

## 2024-03-17 LAB — BODY FLUID CELL COUNT WITH DIFFERENTIAL
Eos, Fluid: 0 %
Lymphs, Fluid: 10 %
Monocyte-Macrophage-Serous Fluid: 18 % — ABNORMAL LOW (ref 50–90)
Neutrophil Count, Fluid: 72 % — ABNORMAL HIGH (ref 0–25)
Total Nucleated Cell Count, Fluid: 160 uL (ref 0–1000)

## 2024-03-17 SURGERY — BRONCHOSCOPY, FLEXIBLE
Anesthesia: General

## 2024-03-17 MED ORDER — LACTATED RINGERS IV SOLN: INTRAVENOUS | Status: DC

## 2024-03-17 MED ORDER — FENTANYL CITRATE (PF) 100 MCG/2ML IJ SOLN: 25.0000 ug | INTRAMUSCULAR | Status: DC | PRN

## 2024-03-17 MED ORDER — EPHEDRINE SULFATE-NACL 50-0.9 MG/10ML-% IV SOSY
PREFILLED_SYRINGE | INTRAVENOUS | Status: DC | PRN
  Administered 2024-03-17 (×2): 5 mg via INTRAVENOUS

## 2024-03-17 MED ORDER — FENTANYL CITRATE (PF) 100 MCG/2ML IJ SOLN
INTRAMUSCULAR | Status: DC | PRN
  Administered 2024-03-17: 50 ug via INTRAVENOUS

## 2024-03-17 MED ORDER — ROCURONIUM BROMIDE 100 MG/10ML IV SOLN
INTRAVENOUS | Status: DC | PRN
  Administered 2024-03-17: 30 mg via INTRAVENOUS

## 2024-03-17 MED ORDER — ONDANSETRON HCL 4 MG/2ML IJ SOLN
INTRAMUSCULAR | Status: AC
Start: 2024-03-17 — End: 2024-03-17
  Filled 2024-03-17: qty 2

## 2024-03-17 MED ORDER — DEXAMETHASONE SODIUM PHOSPHATE 10 MG/ML IJ SOLN
INTRAMUSCULAR | Status: DC | PRN
Start: 2024-03-17 — End: 2024-03-17
  Administered 2024-03-17: 10 mg via INTRAVENOUS

## 2024-03-17 MED ORDER — OXYCODONE HCL 5 MG/5ML PO SOLN: 5.0000 mg | Freq: Once | ORAL | Status: DC | PRN

## 2024-03-17 MED ORDER — SUGAMMADEX SODIUM 200 MG/2ML IV SOLN
INTRAVENOUS | Status: DC | PRN
  Administered 2024-03-17: 400 mg via INTRAVENOUS

## 2024-03-17 MED ORDER — ONDANSETRON HCL 4 MG/2ML IJ SOLN: 4.0000 mg | Freq: Once | INTRAMUSCULAR | Status: DC | PRN

## 2024-03-17 MED ORDER — EPHEDRINE 5 MG/ML INJ
INTRAVENOUS | Status: AC
  Filled 2024-03-17: qty 5

## 2024-03-17 MED ORDER — LACTATED RINGERS IV SOLN: INTRAVENOUS | Status: DC | PRN

## 2024-03-17 MED ORDER — FENTANYL CITRATE (PF) 100 MCG/2ML IJ SOLN
INTRAMUSCULAR | Status: AC
  Filled 2024-03-17: qty 2

## 2024-03-17 MED ORDER — LIDOCAINE HCL (PF) 2 % IJ SOLN
INTRAMUSCULAR | Status: AC
  Filled 2024-03-17: qty 5

## 2024-03-17 MED ORDER — PHENYLEPHRINE HCL (PRESSORS) 10 MG/ML IV SOLN
INTRAVENOUS | Status: AC
  Filled 2024-03-17: qty 1

## 2024-03-17 MED ORDER — ROCURONIUM BROMIDE 10 MG/ML (PF) SYRINGE
PREFILLED_SYRINGE | INTRAVENOUS | Status: AC
  Filled 2024-03-17: qty 10

## 2024-03-17 MED ORDER — ONDANSETRON HCL 4 MG/2ML IJ SOLN
INTRAMUSCULAR | Status: DC | PRN
  Administered 2024-03-17: 4 mg via INTRAVENOUS

## 2024-03-17 MED ORDER — ACETAMINOPHEN 10 MG/ML IV SOLN: 1000.0000 mg | Freq: Once | INTRAVENOUS | Status: DC | PRN

## 2024-03-17 MED ORDER — OXYCODONE HCL 5 MG PO TABS: 5.0000 mg | ORAL_TABLET | Freq: Once | ORAL | Status: DC | PRN

## 2024-03-17 MED ORDER — PROPOFOL 10 MG/ML IV BOLUS
INTRAVENOUS | Status: AC
  Filled 2024-03-17: qty 20

## 2024-03-17 MED ORDER — DEXAMETHASONE SODIUM PHOSPHATE 10 MG/ML IJ SOLN
INTRAMUSCULAR | Status: AC
  Filled 2024-03-17: qty 1

## 2024-03-17 MED ORDER — PROPOFOL 10 MG/ML IV BOLUS
INTRAVENOUS | Status: DC | PRN
  Administered 2024-03-17: 100 mg via INTRAVENOUS

## 2024-03-17 MED ORDER — LIDOCAINE HCL (CARDIAC) PF 100 MG/5ML IV SOSY
PREFILLED_SYRINGE | INTRAVENOUS | Status: DC | PRN
  Administered 2024-03-17: 60 mg via INTRAVENOUS

## 2024-03-17 NOTE — H&P (Signed)
 PULMONOLOGY         Date: 03/17/2024,   MRN# [id number] Timothy Turner 02/19/1941     AdmissionWeight: 74.8 kg                 CurrentWeight: 74.8 kg    CHIEF COMPLAINT:   Unresolved pneumonia   HISTORY OF PRESENT ILLNESS   This is an 21M with hx of recurrent aspiration, depression , GERD, pneumonia with recurrent respiratory distress and pleurisy.  He reports ongoing sensation of choking on mucus and phlegm.  He reports painful breathing.  He did have prior trauma post mechanical fall with resultant right rib fractures.  His wife accompanies him today and was present in the past during his outpatient pulmonology workup.  She endorses that patient has declined with progressive dyspnea and difficulty expectorating inspissated phlegm.  We performed chest imaging and I personally reviewed findings from CT scan with family.  There are bibasilar infiltrates with mucus plugging and bronchiectatic changes.  Today we will perform flexible bronchoscopy with airway inspection, BAL, and therapeutic aspiration of tracheobronchial tree.     -Reviewed risks/complications and benefits with patient, risks include infection, pneumothorax/pneumomediastinum which may require chest tube placement as well as overnight/prolonged hospitalization and possible mechanical ventilation. Other risks include bleeding and very rarely death.  Patient understands risks and wishes to proceed.  Additional questions were answered, and patient is aware that post procedure patient will be going home with family and may experience cough with possible clots on expectoration as well as phlegm which may last few days as well as hoarseness of voice post intubation and mechanical ventilation.    PAST MEDICAL HISTORY   Past Medical History:  Diagnosis Date   Aspiration precautions    uses thicken in liquids.  Eats pureed food.   Barrett esophagus    Depression    Gallstones    GERD (gastroesophageal reflux  disease)    Heart murmur    History of   Hemorrhoids    Hypertension    Hypothyroidism    Iron deficiency anemia 02/10/2012   Mitral valve disease    Myalgia    Multiple   Neuromuscular disorder (HCC)    PAD (peripheral artery disease)    lower legs   Pain syndrome, chronic    Severe   Pneumonia    three times, last time >10 years ago   Psoriasis    Pulmonary embolism (HCC) 2019   left lung   Rheumatic heart valve incompetence    Shortness of breath dyspnea    Thyroiditis      SURGICAL HISTORY   Past Surgical History:  Procedure Laterality Date   BACK SURGERY     CARDIAC CATHETERIZATION N/A 02/22/2015   Procedure: Left Heart Cath and Coronary Angiography;  Surgeon: Evalene JINNY Lunger, MD;  Location: [facility];  Service: Cardiovascular;  Laterality: N/A;   CATARACT EXTRACTION W/PHACO Left 07/29/2016   Procedure: CATARACT EXTRACTION PHACO AND INTRAOCULAR LENS PLACEMENT (IOC)  Left eye IVA Topical;  Surgeon: Dene Etienne, MD;  Location: [facility];  Service: Ophthalmology;  Laterality: Left;   CATARACT EXTRACTION W/PHACO Right 08/19/2016   Procedure: CATARACT EXTRACTION PHACO AND INTRAOCULAR LENS PLACEMENT (IOC) right  TORIC;  Surgeon: Dene Etienne, MD;  Location: [facility];  Service: Ophthalmology;  Laterality: Right;  prefers early morning   CHOLECYSTECTOMY  08/03/13   COLONOSCOPY  03-08-12   Dr Luellen   COLONOSCOPY WITH PROPOFOL  N/A 06/17/2017  Procedure: COLONOSCOPY WITH PROPOFOL ;  Surgeon: Therisa Bi, MD;  Location: [facility];  Service: Gastroenterology;  Laterality: N/A;   DIRECT LARYNGOSCOPY N/A 02/17/2016   Procedure: DIRECT LARYNGOSCOPY;  Surgeon: Marlyce Finer, MD;  Location: [facility];  Service: ENT;  Laterality: N/A;   ESOPHAGEAL MANOMETRY N/A 02/27/2015   Procedure: ESOPHAGEAL MANOMETRY (EM);  Surgeon: Gladis RAYMOND Mariner, MD;  Location: [facility] END[facility]COPY;  Service: Endoscopy;  Laterality: N/A;    ESOPHAGOGASTRODUODENOSCOPY (EGD) WITH PROPOFOL  N/A 02/18/2015   Procedure: ESOPHAGOGASTRODUODENOSCOPY (EGD) WITH PROPOFOL ;  Surgeon: Gladis RAYMOND Mariner, MD;  Location: [facility] END[facility]COPY;  Service: Endoscopy;  Laterality: N/A;   ESOPHAGOGASTRODUODENOSCOPY (EGD) WITH PROPOFOL  N/A 06/17/2017   Procedure: ESOPHAGOGASTRODUODENOSCOPY (EGD) WITH PROPOFOL ;  Surgeon: Therisa Bi, MD;  Location: [facility] ENDOSCOPY;  Service: Gastroenterology;  Laterality: N/A;   ESOPHAGOSCOPY W/ BOTOX  INJECTION N/A 02/17/2016   Procedure: ESOPHAGOSCOPY WITH BOTOX  INJECTION  ;  Surgeon: Marlyce Finer, MD;  Location:  SURGERY CENTER;  Service: ENT;  Laterality: N/A;   HEMORROIDECTOMY     UPPER GI ENDOSCOPY  03-06-2013   Dr Luellen     FAMILY HISTORY   Family History  Problem Relation Age of Onset   Heart failure Mother    Heart failure Father    Emphysema Father        smoked   Cancer Sister    Aneurysm Sister        Brain     SOCIAL HISTORY   Social History   Tobacco Use   Smoking status: Former    Current packs/day: 0.00    Average packs/day: 1 pack/day for 30.0 years (30.0 ttl pk-yrs)    Types: Cigarettes    Start date: 06/22/1974    Quit date: 06/22/2004    Years since quitting: 19.7   Smokeless tobacco: Never  Vaping Use   Vaping status: Never Used  Substance Use Topics   Alcohol use: No    Alcohol/week: 0.0 standard drinks of alcohol   Drug use: No     MEDICATIONS    Home Medication:    Current Medication: No current facility-administered medications for this encounter.    ALLERGIES   Duloxetine, Nsaids, Rosuvastatin, Statins, and Tolmetin     REVIEW OF SYSTEMS    Review of Systems:  Gen:  Denies  fever, sweats, chills weigh loss  HEENT: Denies blurred vision, double vision, ear pain, eye pain, hearing loss, nose bleeds, sore throat Cardiac:  No dizziness, chest pain or heaviness, chest tightness,edema Resp:   reports dyspnea chronically  Gi: Denies swallowing  difficulty, stomach pain, nausea or vomiting, diarrhea, constipation, bowel incontinence Gu:  Denies bladder incontinence, burning urine Ext:   Denies Joint pain, stiffness or swelling Skin: Denies  skin rash, easy bruising or bleeding or hives Endoc:  Denies polyuria, polydipsia , polyphagia or weight change Psych:   Denies depression, insomnia or hallucinations   Other:  All other systems negative   VS: BP (!) 159/72   Pulse 71   Temp 97.9 F (36.6 C)   Resp 13   Ht 6' (1.829 m)   Wt 74.8 kg   SpO2 99%   BMI 22.38 kg/m      PHYSICAL EXAM    GENERAL:NAD, no fevers, chills, no weakness no fatigue HEAD: Normocephalic, atraumatic.  EYES: Pupils equal, round, reactive to light. Extraocular muscles intact. No scleral icterus.  MOUTH: Moist mucosal membrane. Dentition intact. No abscess noted.  EAR, NOSE, THROAT: Clear without exudates. No external lesions.  NECK: Supple.  No thyromegaly. No nodules. No JVD.  PULMONARY: decreased breath sounds with mild rhonchi worse at bases bilaterally.  CARDIOVASCULAR: S1 and S2. Regular rate and rhythm. No murmurs, rubs, or gallops. No edema. Pedal pulses 2+ bilaterally.  GASTROINTESTINAL: Soft, nontender, nondistended. No masses. Positive bowel sounds. No hepatosplenomegaly.  MUSCULOSKELETAL: No swelling, clubbing, or edema. Range of motion full in all extremities.  NEUROLOGIC: Cranial nerves II through XII are intact. No gross focal neurological deficits. Sensation intact. Reflexes intact.  SKIN: No ulceration, lesions, rashes, or cyanosis. Skin warm and dry. Turgor intact.  PSYCHIATRIC: Mood, affect within normal limits. The patient is awake, alert and oriented x 3. Insight, judgment intact.       IMAGING   CLINICAL DATA:  Short of breath for 6 weeks.   EXAM: CT CHEST WITHOUT CONTRAST   TECHNIQUE: Multidetector CT imaging of the chest was performed following the standard protocol without IV contrast.   RADIATION DOSE REDUCTION:  This exam was performed according to the departmental dose-optimization program which includes automated exposure control, adjustment of the mA and/or kV according to patient size and/or use of iterative reconstruction technique.   COMPARISON:  CT chest 11/26/2018   FINDINGS: Cardiovascular: Coronary artery calcification and aortic atherosclerotic calcification.   Mediastinum/Nodes: Borderline enlarged paratracheal and RIGHT hilar lymph nodes are not changed from prior. Trachea and esophagus are normal. Airways normal.   Lungs/Pleura: Peribronchial thickening interstitial thickening in the lower lobes has progressed mildly from comparison exam.   RIGHT upper lobe nodule on image 27 measures 5 mm and not present on comparison exam.   Upper Abdomen: Limited view of the liver, kidneys, pancreas are unremarkable. Normal adrenal glands.   Musculoskeletal: No aggressive osseous lesion.   IMPRESSION: 1. Mild progression of peribronchial thickening and interstitial thickening in the lower lobes. Findings suggest chronic inflammation or infection 2. RIGHT upper lobe pulmonary nodule. Recommend follow-up chest CT in 6 months. 3. Stable borderline enlarged mediastinal lymph nodes. 4.  Aortic Atherosclerosis (ICD10-I70.0).     Electronically Signed   By: Jackquline Boxer M.D.   On: 03/16/2024 13:02  ASSESSMENT/PLAN   Recurrent unresolved pneumonia     There are bibasilar infiltrates with mucus plugging and bronchiectatic changes.  Today we will perform flexible bronchoscopy with airway inspection, BAL, and therapeutic aspiration of tracheobronchial tree.     -Reviewed risks/complications and benefits with patient, risks include infection, pneumothorax/pneumomediastinum which may require chest tube placement as well as overnight/prolonged hospitalization and possible mechanical ventilation. Other risks include bleeding and very rarely death.  Patient understands risks and wishes to  proceed.  Additional questions were answered, and patient is aware that post procedure patient will be going home with family and may experience cough with possible clots on expectoration as well as phlegm which may last few days as well as hoarseness of voice post intubation and mechanical ventilation.            Thank you for allowing me to participate in the care of this patient.   Patient/Family are satisfied with care plan and all questions have been answered.    Provider disclosure: Patient with at least one acute or chronic illness or injury that poses a threat to life or bodily function and is being managed actively during this encounter.  All of the below services have been performed independently by signing provider:  review of prior documentation from internal and or external health records.  Review of previous and current lab results.  Interview and comprehensive assessment  during patient visit today. Review of current and previous chest radiographs/CT scans. Discussion of management and test interpretation with health care team and patient/family.   This document was prepared using Dragon voice recognition software and may include unintentional dictation errors.     Geet Hosking, M.D.  Division of Pulmonary & Critical Care Medicine

## 2024-03-17 NOTE — Discharge Instructions (Addendum)
 Flexible Bronchoscopy    What happens after the procedure? Your blood pressure, heart rate, breathing rate, and blood oxygen level will be monitored until you leave the hospital or clinic. You may have a chest X-ray to check for signs of pneumothorax. You willnot be allowed to eat or drink anything for 2 hours after your procedure. If a biopsy was taken, it is up to you to get the results of the test. Ask your health care provider, or the department that is doing the procedure, when your results will be ready.   Contact a health care provider if: You have a fever.   Get help right away if: You have shortness of breath that gets worse. You get light-headed or feel like you might faint. You have chest pain. You cough up more than a small amount of blood.   These symptoms may be an emergency. Get help right away. Call 911. Do not wait to see if the symptoms will go away. Do not drive yourself to the hospital.   Summary Flexible bronchoscopy is a procedure that allows your health care provider to look closely inside your lungs and to take testing samples if needed. Risks of flexible bronchoscopy include bleeding, infection, and collapsed lung (pneumothorax). Before the procedure, you will be given a medicine to numb your mouth, nose, throat, and voice box. Then, a bronchoscope will be passed into your nose or mouth, and into your lungs. After the procedure, your blood pressure, heart rate, breathing rate, and blood oxygen level will be monitored until you leave the hospital or clinic. You may have a chest X-ray to check for signs of pneumothorax.   This information is not intended to replace advice given to you by your health care provider. Make sure you discuss any questions you have with your health care provider. Document Revised: 09/16/2021 Document Reviewed: 09/16/2021 Elsevier Patient Education  2024 [employer].

## 2024-03-17 NOTE — Anesthesia Preprocedure Evaluation (Addendum)
 Anesthesia Evaluation  Patient identified by MRN, date of birth, ID band Patient awake    Reviewed: Allergy  & Precautions, NPO status , Patient's Chart, lab work & pertinent test results  History of Anesthesia Complications Negative for: history of anesthetic complications  Airway Mallampati: IV   Neck ROM: Full    Dental  (+) Upper Dentures, Lower Dentures   Pulmonary COPD, former smoker   Pulmonary exam normal breath sounds clear to auscultation       Cardiovascular hypertension, + Peripheral Vascular Disease   Rhythm:Regular Rate:Normal + Systolic murmurs Hx PE and DVT   Echo 04/14/23:  NORMAL LEFT VENTRICULAR SYSTOLIC FUNCTION WITH MILD LVH  ESTIMATED EF: >55%, CALC EF(3D): 67%  ELEVATED LA PRESSURES WITH DIASTOLIC DYSFUNCTION  NORMAL RIGHT VENTRICULAR SYSTOLIC FUNCTION  VALVULAR REGURGITATION: No AR, MODERATE MR, No PR, TRIVIAL TR  VALVULAR STENOSIS: MILD AS, No MS, No PS, No TS     Neuro/Psych  PSYCHIATRIC DISORDERS  Depression    Chronic pain    GI/Hepatic ,GERD (Barrett esophagus)  ,,  Endo/Other  Hypothyroidism  Prediabetes   Renal/GU Renal disease (stage III CKD)   Prostate CA    Musculoskeletal   Abdominal   Peds  Hematology  (+) Blood dyscrasia, anemia   Anesthesia Other Findings   Reproductive/Obstetrics                              Anesthesia Physical Anesthesia Plan  ASA: 3  Anesthesia Plan: General   Post-op Pain Management:    Induction: Intravenous  PONV Risk Score and Plan: 2 and Ondansetron , Dexamethasone  and Treatment may vary due to age or medical condition  Airway Management Planned: Oral ETT  Additional Equipment:   Intra-op Plan:   Post-operative Plan: Extubation in OR  Informed Consent: I have reviewed the patients History and Physical, chart, labs and discussed the procedure including the risks, benefits and alternatives for the proposed  anesthesia with the patient or authorized representative who has indicated his/her understanding and acceptance.     Dental advisory given  Plan Discussed with: CRNA  Anesthesia Plan Comments: (Patient consented for risks of anesthesia including but not limited to:  - adverse reactions to medications - damage to eyes, teeth, lips or other oral mucosa - nerve damage due to positioning  - sore throat or hoarseness - damage to heart, brain, nerves, lungs, other parts of body or loss of life  Informed patient about role of CRNA in peri- and intra-operative care.  Patient voiced understanding.)         Anesthesia Quick Evaluation

## 2024-03-17 NOTE — Transfer of Care (Signed)
 Immediate Anesthesia Transfer of Care Note  Patient: Timothy Turner  Procedure(s) Performed: BRONCHOSCOPY, FLEXIBLE IRRIGATION, BRONCHUS  Patient Location: PACU  Anesthesia Type:General  Level of Consciousness: awake and alert   Airway & Oxygen Therapy: Patient Spontanous Breathing and Patient connected to face mask oxygen  Post-op Assessment: Report given to RN and Post -op Vital signs reviewed and stable  Post vital signs: Reviewed and stable  Last Vitals:  Vitals Value Taken Time  BP 145/82 03/17/24 14:19  Temp    Pulse 84 03/17/24 14:21  Resp 15 03/17/24 14:21  SpO2 100 % 03/17/24 14:21  Vitals shown include unfiled device data.  Last Pain:  Vitals:   03/17/24 1151  PainSc: 0-No pain         Complications: No notable events documented.

## 2024-03-17 NOTE — Anesthesia Procedure Notes (Signed)
 Procedure Name: Intubation Date/Time: 03/17/2024 2:03 PM  Performed by: Trudy Rankin LABOR, CRNAPre-anesthesia Checklist: Patient identified, Patient being monitored, Timeout performed, Emergency Drugs available and Suction available Patient Re-evaluated:Patient Re-evaluated prior to induction Oxygen Delivery Method: Circle System Utilized Preoxygenation: Pre-oxygenation with 100% oxygen Induction Type: IV induction and Rapid sequence Laryngoscope Size: Mac, 3 and McGrath Grade View: Grade I Tube type: Oral Tube size: 8.5 mm Number of attempts: 1 Airway Equipment and Method: Stylet Placement Confirmation: ETT inserted through vocal cords under direct vision, positive ETCO2 and breath sounds checked- equal and bilateral Secured at: 21 cm Tube secured with: Tape Dental Injury: Teeth and Oropharynx as per pre-operative assessment

## 2024-03-20 ENCOUNTER — Encounter: Payer: Self-pay | Admitting: Pulmonary Disease

## 2024-03-20 LAB — CULTURE, BAL-QUANTITATIVE W GRAM STAIN: Culture: NO GROWTH

## 2024-03-20 LAB — PATHOLOGIST SMEAR REVIEW

## 2024-03-20 NOTE — Anesthesia Postprocedure Evaluation (Signed)
 Anesthesia Post Note  Patient: Timothy Turner  Procedure(s) Performed: BRONCHOSCOPY, FLEXIBLE IRRIGATION, BRONCHUS  Patient location during evaluation: PACU Anesthesia Type: General Level of consciousness: awake and alert Pain management: pain level controlled Vital Signs Assessment: post-procedure vital signs reviewed and stable Respiratory status: spontaneous breathing, nonlabored ventilation and respiratory function stable Cardiovascular status: blood pressure returned to baseline and stable Postop Assessment: no apparent nausea or vomiting Anesthetic complications: no   No notable events documented.   Last Vitals:  Vitals:   03/17/24 1445 03/17/24 1510  BP: (!) 154/72 (!) 148/84  Pulse: 77 77  Resp: 17 18  Temp: 36.4 C 36.4 C  SpO2: 96% 96%    Last Pain:  Vitals:   03/17/24 1510  TempSrc: Temporal  PainSc:                  Fairy POUR Graig Hessling

## 2024-03-21 LAB — ACID FAST SMEAR (AFB, MYCOBACTERIA): Acid Fast Smear: NEGATIVE

## 2024-03-21 LAB — ASPERGILLUS ANTIGEN, BAL/SERUM: Aspergillus Ag, BAL/Serum: 0.07 {index} (ref 0.00–0.49)

## 2024-03-29 ENCOUNTER — Ambulatory Visit: Admitting: Oncology

## 2024-04-04 ENCOUNTER — Encounter: Payer: Self-pay | Admitting: Oncology

## 2024-04-04 ENCOUNTER — Inpatient Hospital Stay

## 2024-04-04 ENCOUNTER — Inpatient Hospital Stay: Attending: Oncology | Admitting: Oncology

## 2024-04-04 VITALS — BP 121/65 | HR 85 | Temp 98.3°F | Resp 18 | Wt 163.1 lb

## 2024-04-04 DIAGNOSIS — I129 Hypertensive chronic kidney disease with stage 1 through stage 4 chronic kidney disease, or unspecified chronic kidney disease: Secondary | ICD-10-CM | POA: Diagnosis not present

## 2024-04-04 DIAGNOSIS — C9110 Chronic lymphocytic leukemia of B-cell type not having achieved remission: Secondary | ICD-10-CM | POA: Diagnosis not present

## 2024-04-04 DIAGNOSIS — Z79624 Long term (current) use of inhibitors of nucleotide synthesis: Secondary | ICD-10-CM | POA: Diagnosis not present

## 2024-04-04 DIAGNOSIS — Z87891 Personal history of nicotine dependence: Secondary | ICD-10-CM | POA: Diagnosis not present

## 2024-04-04 DIAGNOSIS — N1832 Chronic kidney disease, stage 3b: Secondary | ICD-10-CM

## 2024-04-04 DIAGNOSIS — Z86711 Personal history of pulmonary embolism: Secondary | ICD-10-CM | POA: Insufficient documentation

## 2024-04-04 DIAGNOSIS — N183 Chronic kidney disease, stage 3 unspecified: Secondary | ICD-10-CM | POA: Diagnosis not present

## 2024-04-04 DIAGNOSIS — Z7952 Long term (current) use of systemic steroids: Secondary | ICD-10-CM | POA: Diagnosis not present

## 2024-04-04 DIAGNOSIS — Z79899 Other long term (current) drug therapy: Secondary | ICD-10-CM | POA: Diagnosis not present

## 2024-04-04 DIAGNOSIS — D7589 Other specified diseases of blood and blood-forming organs: Secondary | ICD-10-CM | POA: Diagnosis not present

## 2024-04-04 NOTE — Assessment & Plan Note (Signed)
 Adequate B12 and folate.  No anemia.  Observation.

## 2024-04-04 NOTE — Assessment & Plan Note (Signed)
 Encourage oral hydration and avoid nephrotoxins.  No M protein on multiple myeloma panel.

## 2024-04-04 NOTE — Progress Notes (Signed)
 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  Rai Stage 1 CLL. Diagnosis was reviewed and discussed with patient.  Check CLL FISH and IGHV mutation.  He has no lymphadenopathy on physical exam. CT showed mild mediastinal lymphadenopathy.  Obtain ultrasound abdomen Recommend watchful waiting  Chronic kidney disease (CKD), stage III (moderate) (HCC) Encourage oral hydration and avoid nephrotoxins.  No M protein on multiple myeloma panel.  Macrocytosis Adequate B12 and folate.  No anemia.  Observation.   Orders Placed This Encounter  Procedures   US  Abdomen Complete    Standing Status:   Future    Expected Date:   04/11/2024    Expiration Date:   04/04/2025    Reason for Exam (SYMPTOM  OR DIAGNOSIS REQUIRED):   CLL    Preferred imaging location?:    [facility]   FISH HES Leukemia,4q12 REA    Standing Status:   Future    Number of Occurrences:   1    Expected Date:   04/04/2024    Expiration Date:   07/03/2024   Miscellaneous [facility] test (send-out)    Standing Status:   Future    Number of Occurrences:   1    Expected Date:   04/04/2024    Expiration Date:   07/03/2024    Test name / description::   IGVH somatic hypermutation [facility] [id number]   CMP (Cancer Center only)    Standing Status:   Future    Expected Date:   08/05/2024    Expiration Date:   11/03/2024   CBC with Differential (Cancer Center Only)    Standing Status:   Future    Expected Date:   08/05/2024    Expiration Date:   11/03/2024   Lactate dehydrogenase    Standing Status:   Future    Expected Date:   08/05/2024    Expiration Date:   11/03/2024    Follow-up in 4 months All questions were answered. The patient knows to call the clinic with any problems, questions or concerns.  Zelphia Cap, MD, PhD [facility] Hematology Oncology 04/04/2024   HISTORY OF PRESENTING ILLNESS:   Timothy Turner is a  83 y.o.  male with PMH listed below was seen in consultation at the request of  Epifanio Alm SQUIBB, MD  for evaluation of leukocytosis.   Discussed the use of AI scribe software for clinical note transcription with the patient, who gave verbal consent to proceed.   I reviewed his lab records.  He has a history of elevated white blood cell counts, with a significant increase noted since August 2025, reaching levels above 30,000.  In April 2025, total wbc 10.8  Patient recently took a course steroid along with fluconazole in mid August 2025 for treatment of fungal skin rash and has also received steroid injection to left knee.   Patient reports hip pain.  He underwent a complete hip replacement following a hip fracture on February 14, 2023. About 4-6 weeks ago, he began experiencing significant aching in the hip, which was exacerbated by a recent slip and fall.   He has been experiencing difficulty breathing for approximately four weeks, which has worsened over time.  He is unable to walk across a room without experiencing shortness of breath. He has a history of smoking and has been diagnosed with emphysema. He is established with pulmonology.   He notes a change in urine color to a darker brown over the past three to four weeks, which has not improved. He has been advised in the past to monitor water intake due to kidney function concerns.  He has a history of psoriasis, which is generally well-controlled except for itching at the edges of his eyes and scalp. No recent weight loss or excessive sweating at night. He reports a good appetite and has maintained stable weight. H recently finished a course Megace.   He has history of PE,  diagnosed in 2019, provoked by a preceding history of left lower extremity trauma/injury, temporarily immobilization. Off anticoagulation currently.    INTERVAL HISTORY Timothy Turner is a 83 y.o. male who has above history reviewed by me today presents for follow up visit for CLL.  Patient has had blood work done during interval present to discuss results. Since his last visit, he has also been seen by his pulmonologist Dr. Parris and had bronchoscopy.   Patient continues to have chronic shortness of breath with exertion.  Patient is on steroid. + Acid reflux. Weight is stable.   MEDICAL HISTORY:  Past Medical History:  Diagnosis Date   Aspiration precautions    uses thicken in liquids.  Eats pureed food.   Barrett esophagus    Depression    Gallstones    GERD (gastroesophageal reflux disease)    Heart murmur    History of   Hemorrhoids    Hypertension    Hypothyroidism    Iron deficiency anemia 02/10/2012   Mitral valve disease    Myalgia    Multiple   Neuromuscular disorder (HCC)    PAD (peripheral artery disease)    lower legs   Pain syndrome, chronic    Severe   Pneumonia    three times, last time >10 years ago   Psoriasis    Pulmonary embolism (HCC) 2019   left lung   Rheumatic heart valve incompetence    Shortness of breath dyspnea    Thyroiditis     SURGICAL HISTORY: Past Surgical History:  Procedure Laterality Date   BACK SURGERY     BRONCHIAL WASHINGS N/A 03/17/2024   Procedure: IRRIGATION, BRONCHUS;  Surgeon: Parris Manna, MD;  Location: [facility] ORS;  Service: Thoracic;  Laterality: N/A;   CARDIAC CATHETERIZATION N/A 02/22/2015   Procedure: Left Heart Cath and Coronary Angiography;  Surgeon: Evalene JINNY Lunger, MD;  Location: [facility] INVASIVE CV LAB;  Service: Cardiovascular;  Laterality: N/A;   CATARACT EXTRACTION W/PHACO Left 07/29/2016   Procedure: CATARACT EXTRACTION PHACO AND INTRAOCULAR LENS PLACEMENT (IOC)  Left eye IVA Topical;  Surgeon: Dene Etienne, MD;  Location: [facility];  Service: Ophthalmology;  Laterality: Left;   CATARACT EXTRACTION W/PHACO Right 08/19/2016   Procedure: CATARACT EXTRACTION PHACO AND INTRAOCULAR LENS PLACEMENT (IOC) right  TORIC;  Surgeon: Dene Etienne, MD;  Location: [facility];  Service: Ophthalmology;  Laterality: Right;  prefers early morning   CHOLECYSTECTOMY  08/03/13   COLONOSCOPY  03-08-12   Dr Luellen   COLONOSCOPY WITH PROPOFOL  N/A 06/17/2017   Procedure: COLONOSCOPY WITH PROPOFOL ;  Surgeon: Therisa Bi, MD;  Location: [facility] ENDOSCOPY;  Service: Gastroenterology;  Laterality: N/A;   DIRECT LARYNGOSCOPY N/A 02/17/2016   Procedure: DIRECT LARYNGOSCOPY;  Surgeon: Marlyce Finer, MD;  Location: [facility]  ;  Service: ENT;  Laterality: N/A;   ESOPHAGEAL MANOMETRY N/A 02/27/2015   Procedure: ESOPHAGEAL MANOMETRY (EM);  Surgeon: Gladis RAYMOND Mariner, MD;  Location: [facility] ENDOSCOPY;  Service: Endoscopy;  Laterality: N/A;   ESOPHAGOGASTRODUODENOSCOPY (EGD) WITH PROPOFOL  N/A 02/18/2015   Procedure: ESOPHAGOGASTRODUODENOSCOPY (EGD) WITH PROPOFOL ;  Surgeon: Gladis RAYMOND Mariner, MD;  Location: [facility] ENDOSCOPY;  Service: Endoscopy;  Laterality: N/A;   ESOPHAGOGASTRODUODENOSCOPY (EGD) WITH PROPOFOL  N/A 06/17/2017   Procedure: ESOPHAGOGASTRODUODENOSCOPY (EGD) WITH PROPOFOL ;  Surgeon: Therisa Bi, MD;  Location: [facility] ENDOSCOPY;  Service: Gastroenterology;  Laterality: N/A;   ESOPHAGOSCOPY W/ BOTOX  INJECTION N/A 02/17/2016   Procedure: ESOPHAGOSCOPY WITH BOTOX  INJECTION  ;  Surgeon: Marlyce Finer, MD;  Location: [facility];  Service: ENT;  Laterality: N/A;   FLEXIBLE BRONCHOSCOPY N/A 03/17/2024   Procedure: BRONCHOSCOPY, FLEXIBLE;  Surgeon: Parris Manna, MD;  Location: [facility] ORS;  Service: Thoracic;  Laterality: N/A;   HEMORROIDECTOMY     UPPER GI ENDOSCOPY  03-06-2013   Dr Luellen    SOCIAL HISTORY: Social History   Socioeconomic History   Marital status: Married     Spouse name: Not on file   Number of children: 2   Years of education: Not on file   Highest education level: Not on file  Occupational History   Occupation: [employer] Education officer, environmental    Comment: Retired   Occupation: Disabled  Tobacco Use   Smoking status: Former    Current packs/day: 0.00    Average packs/day: 1 pack/day for 30.0 years (30.0 ttl pk-yrs)    Types: Cigarettes    Start date: 06/22/1974    Quit date: 06/22/2004    Years since quitting: 19.7   Smokeless tobacco: Never  Vaping Use   Vaping status: Never Used  Substance and Sexual Activity   Alcohol use: No    Alcohol/week: 0.0 standard drinks of alcohol   Drug use: No   Sexual activity: Yes  Other Topics Concern   Not on file  Social History Narrative   Drinks 4-5 cups of coffee a day and 3-4 cans of soda a day.   Social Drivers of Corporate investment banker Strain: Low Risk  (04/03/2024)   Received from [facility]   Overall Financial Resource Strain (CARDIA)    Difficulty of Paying Living Expenses: Not hard at all  Food Insecurity: No Food Insecurity (04/03/2024)   Received from [facility]   Hunger Vital Sign    Within the past 12 months, you worried that your food would run out before you got the money to buy more.: Never true    Within the past 12 months, the food you bought just didn't last and you didn't have money to get more.: Never true  Transportation Needs: No Transportation Needs (04/03/2024)   Received from [facility] - Transportation    In the past 12 months, has lack of transportation kept you from medical appointments or from getting medications?: No    Lack of Transportation (Non-Medical): No  Physical Activity: Sufficiently Active (03/30/2023)   Received from [facility]   Exercise Vital Sign    On average, how many days per week do you engage in moderate to strenuous exercise (like a brisk walk)?: 7 days     On average, how many minutes do you engage in exercise at this level?: 30 min  Stress: Stress Concern Present (03/30/2023)   Received from [facility]   [employer] of Occupational Health - Occupational Stress Questionnaire    Feeling of Stress : To some extent  Social Connections: Moderately Integrated (03/30/2023)   Received from [facility]   Social Connection and Isolation Panel    In a typical week, how many times do you talk on the phone with family, friends, or neighbors?: More than three times a week    How often do you get together with friends or relatives?: Once a week    How often do you attend church or religious services?: 1 to 4 times per year    Do you belong to any clubs or organizations such as church groups, unions, fraternal or athletic groups, or school groups?: No    How often do you attend meetings of the clubs or organizations you belong to?: Never    Are you married, widowed, divorced, separated, never married, or living with a partner?: Married  Intimate Partner Violence: Not At Risk (03/03/2024)   Humiliation, Afraid, Rape, and Kick questionnaire    Fear of Current or Ex-Partner: No    Emotionally Abused: No    Physically Abused: No    Sexually Abused: No    FAMILY HISTORY: Family History  Problem Relation Age of Onset   Heart failure Mother    Heart failure Father    Emphysema Father        smoked   Cancer Sister    Aneurysm Sister        Brain    ALLERGIES:  is allergic to duloxetine, nsaids, rosuvastatin, statins, and tolmetin.  MEDICATIONS:  Current Outpatient Medications  Medication Sig Dispense Refill   Carboxymethylcellulose Sod PF (THERATEARS PF) 0.25 % SOLN Apply to eye as needed (dry eyes).     clonazePAM  (KLONOPIN ) 2 MG tablet Take 2 mg by mouth 3 (three) times daily as needed.     donepezil  (ARICEPT ) 5 MG tablet Take 1 tablet (5 mg total) by mouth at bedtime. 30 tablet 0   esomeprazole  (NEXIUM )  40 MG capsule Take 40 mg by mouth daily.     finasteride  (PROSCAR ) 5 MG tablet TAKE 1 TABLET DAILY 90 tablet 3   ipratropium (ATROVENT) 0.06 % nasal spray Place into the nose as needed for rhinitis.     levothyroxine  (SYNTHROID ) 112 MCG tablet Take 112 mcg by mouth daily.     losartan  (COZAAR ) 25 MG tablet Take 1 tablet (25 mg total) by mouth daily. 30 tablet 0   megestrol (MEGACE ES) 625 MG/5ML suspension Take 625 mg by mouth.     mirtazapine  (REMERON ) 7.5 MG tablet Take 1 tablet (7.5 mg total) by mouth at bedtime. 30 tablet 0   pantoprazole  (PROTONIX ) 40 MG tablet Take 1 tablet (40 mg total) by mouth daily. 30 tablet 0   predniSONE  (DELTASONE ) 20 MG tablet Take 20 mg by mouth daily with breakfast.     traMADol  (ULTRAM ) 50 MG tablet Take 50 mg by mouth every 6 (six) hours as needed.     traZODone (DESYREL) 150 MG tablet Take 150 mg by mouth.     valACYclovir  (VALTREX ) 500 MG tablet TAKE 1 TABLET BY MOUTH DAILY 30 tablet 0   ketoconazole  (NIZORAL ) 2 % cream Apply topically at bedtime. qhs to face for seborrheic dermatitis (Patient not taking: Reported on 04/04/2024) 60 g 6   levothyroxine  (SYNTHROID ) 88 MCG tablet Take 1 tablet (88 mcg total) by mouth daily before breakfast. (Patient not taking: Reported on 04/04/2024) 30 tablet  0   lidocaine  (LIDODERM ) 5 % 1 patch daily. (Patient not taking: Reported on 04/04/2024)     mometasone  (ELOCON ) 0.1 % lotion Apply topically daily. Apply to aa's scalp QD-BID PRN. (Patient not taking: Reported on 04/04/2024) 60 mL 2   Sulfacetamide  Sodium 10 % SHAM Shampoo into the scalp let sit 5 minutes then wash out. Use QD. (Patient not taking: Reported on 04/04/2024) 237 mL 2   Tiotropium Bromide -Olodaterol (STIOLTO RESPIMAT ) 2.5-2.5 MCG/ACT AERS Inhale 2 each into the lungs daily at 2 PM. (Patient not taking: Reported on 04/04/2024) 4 g 0   No current facility-administered medications for this visit.    Review of Systems  Constitutional:  Positive for fatigue.  Negative for appetite change, chills, fever and unexpected weight change.  HENT:   Negative for hearing loss and voice change.   Eyes:  Negative for eye problems and icterus.  Respiratory:  Positive for shortness of breath. Negative for chest tightness and cough.   Cardiovascular:  Negative for chest pain and leg swelling.  Gastrointestinal:  Negative for abdominal distention, abdominal pain, nausea and vomiting.  Endocrine: Negative for hot flashes.  Genitourinary:  Negative for difficulty urinating, dysuria and frequency.   Musculoskeletal:  Positive for arthralgias.  Skin:  Negative for itching and rash.  Neurological:  Negative for light-headedness and numbness.  Hematological:  Negative for adenopathy. Does not bruise/bleed easily.  Psychiatric/Behavioral:  Negative for confusion.    PHYSICAL EXAMINATION:  Vitals:   04/04/24 0946  BP: 121/65  Pulse: 85  Resp: 18  Temp: 98.3 F (36.8 C)  SpO2: 95%   Filed Weights   04/04/24 0946  Weight: 163 lb 1.6 oz (74 kg)    Physical Exam Constitutional:      General: He is not in acute distress.    Comments: Frail elderly male.  HENT:     Head: Normocephalic and atraumatic.  Eyes:     General: No scleral icterus. Cardiovascular:     Rate and Rhythm: Normal rate and regular rhythm.     Heart sounds: Normal heart sounds.  Pulmonary:     Effort: Pulmonary effort is normal. No respiratory distress.     Breath sounds: No wheezing.  Abdominal:     General: Bowel sounds are normal. There is no distension.     Palpations: Abdomen is soft.  Musculoskeletal:        General: No deformity. Normal range of motion.     Cervical back: Normal range of motion and neck supple.  Skin:    Findings: No erythema or rash.  Neurological:     Mental Status: He is alert. Mental status is at baseline.  Psychiatric:        Mood and Affect: Mood normal.     LABORATORY DATA:  I have reviewed the data as listed    Latest Ref Rng & Units  03/03/2024   11:51 AM 02/18/2024    3:00 PM 07/13/2019   10:14 AM  CBC  WBC 4.0 - 10.5 K/uL 33.6  32.1    Hemoglobin 13.0 - 17.0 g/dL 85.7  85.9  85.0   Hematocrit 39.0 - 52.0 % 44.4  44.3  45.3   Platelets 150 - 400 K/uL 183  273        Latest Ref Rng & Units 03/03/2024   11:51 AM 11/14/2019    8:52 AM 10/16/2019    9:36 AM  CMP  Glucose 70 - 99 mg/dL 891  889  896  BUN 8 - 23 mg/dL 43  17  14   Creatinine 0.61 - 1.24 mg/dL 8.24  8.55  8.51   Sodium 135 - 145 mmol/L 134  141  140   Potassium 3.5 - 5.1 mmol/L 4.5  4.4  4.9   Chloride 98 - 111 mmol/L 108  105  102   CO2 22 - 32 mmol/L 20  27  29    Calcium 8.9 - 10.3 mg/dL 8.8  9.9  9.8   Total Protein 6.5 - 8.1 g/dL 6.6  6.5  6.6   Total Bilirubin 0.0 - 1.2 mg/dL 0.7  0.6  0.8   Alkaline Phos 38 - 126 U/L 53  64  68   AST 15 - 41 U/L 21  19  22    ALT 0 - 44 U/L 24  11  12        RADIOGRAPHIC STUDIES: I have personally reviewed the radiological images as listed and agreed with the findings in the report. DG Chest Port 1 View Result Date: 03/17/2024 CLINICAL DATA:  Status post bronchoscopy EXAM: PORTABLE CHEST 1 VIEW COMPARISON:  10/18/2019, 03/16/2024 FINDINGS: Single frontal view of the chest demonstrates an unremarkable cardiac silhouette. Stable emphysema and bilateral bronchiectasis. Bronchial wall thickening is again noted, unchanged since earlier CT. No acute airspace disease, effusion, or pneumothorax. No acute bony abnormalities. IMPRESSION: 1. No complication after bronchoscopy. 2. Bronchiectasis and bilateral bronchial wall thickening unchanged since earlier CT. Electronically Signed   By: Ozell Daring M.D.   On: 03/17/2024 14:57   CT CHEST WO CONTRAST Result Date: 03/16/2024 CLINICAL DATA:  Short of breath for 6 weeks. EXAM: CT CHEST WITHOUT CONTRAST TECHNIQUE: Multidetector CT imaging of the chest was performed following the standard protocol without IV contrast. RADIATION DOSE REDUCTION: This exam was performed according  to the departmental dose-optimization program which includes automated exposure control, adjustment of the mA and/or kV according to patient size and/or use of iterative reconstruction technique. COMPARISON:  CT chest 11/26/2018 FINDINGS: Cardiovascular: Coronary artery calcification and aortic atherosclerotic calcification. Mediastinum/Nodes: Borderline enlarged paratracheal and RIGHT hilar lymph nodes are not changed from prior. Trachea and esophagus are normal. Airways normal. Lungs/Pleura: Peribronchial thickening interstitial thickening in the lower lobes has progressed mildly from comparison exam. RIGHT upper lobe nodule on image 27 measures 5 mm and not present on comparison exam. Upper Abdomen: Limited view of the liver, kidneys, pancreas are unremarkable. Normal adrenal glands. Musculoskeletal: No aggressive osseous lesion. IMPRESSION: 1. Mild progression of peribronchial thickening and interstitial thickening in the lower lobes. Findings suggest chronic inflammation or infection 2. RIGHT upper lobe pulmonary nodule. Recommend follow-up chest CT in 6 months. 3. Stable borderline enlarged mediastinal lymph nodes. 4.  Aortic Atherosclerosis (ICD10-I70.0). Electronically Signed   By: Jackquline Boxer M.D.   On: 03/16/2024 13:02

## 2024-04-04 NOTE — Assessment & Plan Note (Addendum)
 03/03/2024 Flowcytometry showed Chronic lymphocytic leukemia (CLL), clonal B cells representing 38% of leukocytes, positive for CD20, CD22 and CD19, negative for CD38.  Rai Stage 1 CLL. Diagnosis was reviewed and discussed with patient.  Check CLL FISH and IGHV mutation.  He has no lymphadenopathy on physical exam. CT showed mild mediastinal lymphadenopathy.  Obtain ultrasound abdomen Recommend watchful waiting

## 2024-04-07 LAB — CULTURE, FUNGUS WITHOUT SMEAR

## 2024-04-10 ENCOUNTER — Ambulatory Visit
Admission: RE | Admit: 2024-04-10 | Discharge: 2024-04-10 | Disposition: A | Discharge status: Home / Self Care | Source: Ambulatory Visit | Attending: Oncology | Admitting: Oncology

## 2024-04-10 DIAGNOSIS — C9110 Chronic lymphocytic leukemia of B-cell type not having achieved remission: Secondary | ICD-10-CM | POA: Diagnosis present

## 2024-04-10 NOTE — Procedures (Addendum)
  PROCEDURE: BRONCHOSCOPY Therapeutic Aspiration of Tracheobronchial Tree and Bronchoalveolar lavage  PROCEDURE DATE: 03/17/24  TIME:  NAMEBETTIE SWAVELY  DOB:04/14/1941  MRN: [id number] LOC:  [facility]/None    HOSP DAY: @LENGTHOFSTAYDAYS @ CODE STATUS:   Code Status History     Date Active Date Inactive Code Status Order ID Comments User Context   11/11/2017 2045 11/14/2017 1809 Full Code [id number]  Runell Redia PARAS, MD Inpatient   02/22/2015 1151 02/22/2015 1740 Full Code [id number]  Perla Evalene PARAS, MD Inpatient           Indications/Preliminary Diagnosis:   Consent: (Place X beside choice/s below)  The benefits, risks and possible complications of the procedure were        explained to:  _x__ patient  ___ patient's family  ___ other:___________  who verbalized understanding and gave:  ___ verbal  _x__ written  ___ verbal and written  ___ telephone  ___ other:________ consent.      Unable to obtain consent; procedure performed on emergent basis.     Other:       PRESEDATION ASSESSMENT: History and Physical has been performed. Patient meds and allergies have been reviewed. Presedation airway examination has been performed and documented. Baseline vital signs, sedation score, oxygenation status, and cardiac rhythm were reviewed. Patient was deemed to be in satisfactory condition to undergo the procedure.     PROCEDURE DETAILS: Timeout performed and correct patient, name, & ID confirmed. Following prep per Pulmonary policy, appropriate sedation was administered. The Bronchoscope was inserted in to oral cavity with bite block in place. Therapeutic aspiration of Tracheobronchial tree was performed.  Airway exam proceeded with findings, technical procedures, and specimen collection as noted below. At the end of exam the scope was withdrawn without incident. Impression and Plan as noted below.         Insertion Route (Place X beside choice below)   Nasal   Oral  x  Endotracheal Tube   Tracheostomy     Medication Amt Dose  Medication Amt Dose  Lidocaine  1%  cc  Epinephrine  1:10,000 sol  cc  Xylocaine  4%  cc  Cocaine  cc   TECHNICAL PROCEDURES: (Place X beside choice below)   Procedures  Description    None     Electrocautery     Cryotherapy     Balloon Dilatation     Bronchography     Stent Placement   x  Therapeutic Aspiration Bilateral     Laser/Argon Plasma    Brachytherapy Catheter Placement    Foreign Body Removal         SPECIMENS (Sites): (Place X beside choice below)  Specimens Description   No Specimens Obtained     Washings   x Lavage Right middle lobe   Biopsies    Fine Needle Aspirates    Brushings    Sputum    FINDINGS:   Mucus plugging of tracheobronchial tree bilaterally.  BAL was done at [facility] ESTIMATED BLOOD LOSS: none COMPLICATIONS/RESOLUTION: none     Sandara Tyree, M.D.  Pulmonary & Critical Care Medicine  [facility]

## 2024-04-11 LAB — FISH HES LEUKEMIA, 4Q12 REA

## 2024-04-13 LAB — MISC LABCORP TEST (SEND OUT): Labcorp test code: 113753

## 2024-04-20 ENCOUNTER — Other Ambulatory Visit
Admission: RE | Admit: 2024-04-20 | Discharge: 2024-04-20 | Disposition: A | Discharge status: Home / Self Care | Source: Ambulatory Visit | Attending: Pulmonary Disease | Admitting: Pulmonary Disease

## 2024-04-20 ENCOUNTER — Other Ambulatory Visit: Payer: Self-pay | Admitting: Pulmonary Disease

## 2024-04-20 DIAGNOSIS — I8290 Acute embolism and thrombosis of unspecified vein: Secondary | ICD-10-CM | POA: Insufficient documentation

## 2024-04-20 DIAGNOSIS — R0602 Shortness of breath: Secondary | ICD-10-CM | POA: Insufficient documentation

## 2024-04-20 DIAGNOSIS — J189 Pneumonia, unspecified organism: Secondary | ICD-10-CM | POA: Insufficient documentation

## 2024-04-20 DIAGNOSIS — R0989 Other specified symptoms and signs involving the circulatory and respiratory systems: Secondary | ICD-10-CM

## 2024-04-20 LAB — D-DIMER, QUANTITATIVE: D-Dimer, Quant: 2.61 ug{FEU}/mL — ABNORMAL HIGH (ref 0.00–0.50)

## 2024-04-21 ENCOUNTER — Other Ambulatory Visit: Payer: Self-pay | Admitting: Pulmonary Disease

## 2024-04-21 ENCOUNTER — Ambulatory Visit
Admission: RE | Admit: 2024-04-21 | Discharge: 2024-04-21 | Disposition: A | Discharge status: Home / Self Care | Source: Ambulatory Visit | Attending: Pulmonary Disease | Admitting: Pulmonary Disease

## 2024-04-21 ENCOUNTER — Encounter
Admission: RE | Admit: 2024-04-21 | Discharge: 2024-04-21 | Disposition: A | Discharge status: Home / Self Care | Source: Ambulatory Visit | Attending: Pulmonary Disease | Admitting: Pulmonary Disease

## 2024-04-21 DIAGNOSIS — I8290 Acute embolism and thrombosis of unspecified vein: Secondary | ICD-10-CM

## 2024-04-21 DIAGNOSIS — R0989 Other specified symptoms and signs involving the circulatory and respiratory systems: Secondary | ICD-10-CM

## 2024-04-21 DIAGNOSIS — R0602 Shortness of breath: Secondary | ICD-10-CM | POA: Diagnosis present

## 2024-04-21 DIAGNOSIS — J189 Pneumonia, unspecified organism: Secondary | ICD-10-CM

## 2024-04-21 MED ORDER — TECHNETIUM TO 99M ALBUMIN AGGREGATED
4.1800 | Freq: Once | INTRAVENOUS | Status: AC | PRN
  Administered 2024-04-21: 4.18 via INTRAVENOUS

## 2024-05-02 LAB — ACID FAST CULTURE WITH REFLEXED SENSITIVITIES (MYCOBACTERIA): Acid Fast Culture: NEGATIVE

## 2024-06-29 ENCOUNTER — Other Ambulatory Visit: Payer: Self-pay | Admitting: Infectious Diseases

## 2024-06-29 DIAGNOSIS — S0993XA Unspecified injury of face, initial encounter: Secondary | ICD-10-CM

## 2024-07-03 ENCOUNTER — Encounter: Attending: Pulmonary Disease

## 2024-07-03 DIAGNOSIS — J449 Chronic obstructive pulmonary disease, unspecified: Secondary | ICD-10-CM

## 2024-07-03 NOTE — Progress Notes (Signed)
 Incomplete Session Note  Patient Details  Name: Timothy Turner MRN: [id number] Date of Birth: 09-Jul-1940 Referring Provider:    Sheena JULIANNA Spare did not complete his initial rehab session.  [name] demonstrated during the initial orientation that he is having balance concerns and a recent increase in episodes of falling in the last several weeks. He also would have difficulty with being independent with exercising in pulmonary rehab at this time. He was educated on and recommended to get a referral for physical therapy to work on his balance and strength prior to starting pulmonary rehab. He sees his primary care doctor tomorrow and will discuss with him about getting a referral as well as inform his pulmonologist about this. Once he becomes more independent, they will call back to start pulmonary rehab.

## 2024-07-03 NOTE — Progress Notes (Signed)
 Assessment start time: 10:37 AM  Digestive issues/concerns: no known allergies   24-hours Recall: B: cheerio, milk L: sandwich OR cheese crackers D: salmon, french fries,   Beverages 48-64oz  Education r/t nutrition plan Timothy Turner reports he eats well, has a good appetite and typically eats 3 meals per day. He drinks most water, getting around 48-64oz daily. Reviewed his recall and discussed increased calorie needs for his body, making sure he balanced meals and meets protein goals. Encouraged healthy fats to increase calorie intake. He plans to do PT to improve strength so he can attend cardiac rehab.   Goal 1: Eat 3 times per day, small frequent meals or nutrient dense snacks  Goal 2: Eat 15-30gProtein and 30-60gCarbs at each meal. Goal 3: Read labels and reduce sodium intake to below 2300mg . Ideally 1500mg  per day.   End time 10:52 AM

## 2024-07-04 ENCOUNTER — Ambulatory Visit
Admission: RE | Admit: 2024-07-04 | Discharge: 2024-07-04 | Disposition: A | Discharge status: Home / Self Care | Source: Ambulatory Visit | Attending: Infectious Diseases | Admitting: Infectious Diseases

## 2024-07-04 DIAGNOSIS — S0993XA Unspecified injury of face, initial encounter: Secondary | ICD-10-CM | POA: Diagnosis present

## 2024-08-02 ENCOUNTER — Inpatient Hospital Stay: Admitting: Oncology

## 2024-08-02 ENCOUNTER — Inpatient Hospital Stay

## 2024-09-13 ENCOUNTER — Other Ambulatory Visit

## 2024-09-15 ENCOUNTER — Ambulatory Visit: Admitting: Urology
